# Patient Record
Sex: Female | Born: 1935 | Race: White | Hispanic: No | Marital: Married | Smoking: Never smoker
Health system: Southern US, Community
[De-identification: ages and names within clinical notes are randomized; demographics above are authoritative.]

## PROBLEM LIST (undated history)

## (undated) DIAGNOSIS — K222 Esophageal obstruction: Secondary | ICD-10-CM

## (undated) DIAGNOSIS — I5032 Chronic diastolic (congestive) heart failure: Secondary | ICD-10-CM

## (undated) DIAGNOSIS — K648 Other hemorrhoids: Secondary | ICD-10-CM

## (undated) DIAGNOSIS — K8020 Calculus of gallbladder without cholecystitis without obstruction: Secondary | ICD-10-CM

## (undated) DIAGNOSIS — E785 Hyperlipidemia, unspecified: Secondary | ICD-10-CM

## (undated) DIAGNOSIS — J8410 Pulmonary fibrosis, unspecified: Secondary | ICD-10-CM

## (undated) DIAGNOSIS — K635 Polyp of colon: Secondary | ICD-10-CM

## (undated) DIAGNOSIS — F32A Depression, unspecified: Secondary | ICD-10-CM

## (undated) DIAGNOSIS — E669 Obesity, unspecified: Secondary | ICD-10-CM

## (undated) DIAGNOSIS — I2510 Atherosclerotic heart disease of native coronary artery without angina pectoris: Secondary | ICD-10-CM

## (undated) DIAGNOSIS — M1990 Unspecified osteoarthritis, unspecified site: Secondary | ICD-10-CM

## (undated) DIAGNOSIS — R0600 Dyspnea, unspecified: Secondary | ICD-10-CM

## (undated) DIAGNOSIS — I10 Essential (primary) hypertension: Secondary | ICD-10-CM

## (undated) DIAGNOSIS — F329 Major depressive disorder, single episode, unspecified: Secondary | ICD-10-CM

## (undated) DIAGNOSIS — C50919 Malignant neoplasm of unspecified site of unspecified female breast: Secondary | ICD-10-CM

## (undated) DIAGNOSIS — K219 Gastro-esophageal reflux disease without esophagitis: Secondary | ICD-10-CM

## (undated) DIAGNOSIS — K5790 Diverticulosis of intestine, part unspecified, without perforation or abscess without bleeding: Secondary | ICD-10-CM

## (undated) HISTORY — DX: Essential (primary) hypertension: I10

## (undated) HISTORY — DX: Other hemorrhoids: K64.8

## (undated) HISTORY — PX: CHOLECYSTECTOMY: SHX55

## (undated) HISTORY — DX: Unspecified osteoarthritis, unspecified site: M19.90

## (undated) HISTORY — PX: ABDOMINAL HYSTERECTOMY: SHX81

## (undated) HISTORY — DX: Gastro-esophageal reflux disease without esophagitis: K21.9

## (undated) HISTORY — DX: Major depressive disorder, single episode, unspecified: F32.9

## (undated) HISTORY — PX: FOOT SURGERY: SHX648

## (undated) HISTORY — DX: Diverticulosis of intestine, part unspecified, without perforation or abscess without bleeding: K57.90

## (undated) HISTORY — PX: APPENDECTOMY: SHX54

## (undated) HISTORY — DX: Malignant neoplasm of unspecified site of unspecified female breast: C50.919

## (undated) HISTORY — DX: Dyspnea, unspecified: R06.00

## (undated) HISTORY — DX: Obesity, unspecified: E66.9

## (undated) HISTORY — DX: Esophageal obstruction: K22.2

## (undated) HISTORY — DX: Pulmonary fibrosis, unspecified: J84.10

## (undated) HISTORY — DX: Calculus of gallbladder without cholecystitis without obstruction: K80.20

## (undated) HISTORY — PX: BREAST SURGERY: SHX581

## (undated) HISTORY — DX: Hyperlipidemia, unspecified: E78.5

## (undated) HISTORY — PX: MASTECTOMY: SHX3

## (undated) HISTORY — DX: Atherosclerotic heart disease of native coronary artery without angina pectoris: I25.10

## (undated) HISTORY — DX: Depression, unspecified: F32.A

---

## 1967-11-07 DIAGNOSIS — C50919 Malignant neoplasm of unspecified site of unspecified female breast: Secondary | ICD-10-CM

## 1967-11-07 HISTORY — DX: Malignant neoplasm of unspecified site of unspecified female breast: C50.919

## 1998-10-18 ENCOUNTER — Other Ambulatory Visit: Admission: RE | Admit: 1998-10-18 | Discharge: 1998-10-18 | Payer: Self-pay | Admitting: *Deleted

## 1999-10-03 ENCOUNTER — Encounter: Admission: RE | Admit: 1999-10-03 | Discharge: 1999-10-03 | Payer: Self-pay | Admitting: *Deleted

## 1999-10-03 ENCOUNTER — Encounter: Payer: Self-pay | Admitting: *Deleted

## 1999-10-17 ENCOUNTER — Other Ambulatory Visit: Admission: RE | Admit: 1999-10-17 | Discharge: 1999-10-17 | Payer: Self-pay | Admitting: *Deleted

## 2000-11-15 ENCOUNTER — Other Ambulatory Visit: Admission: RE | Admit: 2000-11-15 | Discharge: 2000-11-15 | Payer: Self-pay | Admitting: *Deleted

## 2004-05-16 ENCOUNTER — Ambulatory Visit (HOSPITAL_BASED_OUTPATIENT_CLINIC_OR_DEPARTMENT_OTHER): Admission: RE | Admit: 2004-05-16 | Discharge: 2004-05-16 | Payer: Self-pay | Admitting: Otolaryngology

## 2005-01-16 ENCOUNTER — Other Ambulatory Visit: Admission: RE | Admit: 2005-01-16 | Discharge: 2005-01-16 | Payer: Self-pay | Admitting: Gynecology

## 2006-03-02 ENCOUNTER — Encounter: Admission: RE | Admit: 2006-03-02 | Discharge: 2006-03-02 | Payer: Self-pay | Admitting: Internal Medicine

## 2006-11-06 DIAGNOSIS — K5790 Diverticulosis of intestine, part unspecified, without perforation or abscess without bleeding: Secondary | ICD-10-CM

## 2006-11-06 HISTORY — DX: Diverticulosis of intestine, part unspecified, without perforation or abscess without bleeding: K57.90

## 2006-11-15 ENCOUNTER — Ambulatory Visit: Payer: Self-pay | Admitting: Gastroenterology

## 2006-11-26 ENCOUNTER — Ambulatory Visit: Payer: Self-pay | Admitting: Gastroenterology

## 2007-01-21 ENCOUNTER — Other Ambulatory Visit: Admission: RE | Admit: 2007-01-21 | Discharge: 2007-01-21 | Payer: Self-pay | Admitting: Gynecology

## 2007-03-08 ENCOUNTER — Encounter: Admission: RE | Admit: 2007-03-08 | Discharge: 2007-03-08 | Payer: Self-pay | Admitting: Orthopaedic Surgery

## 2007-04-22 ENCOUNTER — Ambulatory Visit (HOSPITAL_COMMUNITY): Admission: RE | Admit: 2007-04-22 | Discharge: 2007-04-22 | Payer: Self-pay | Admitting: Internal Medicine

## 2010-03-10 ENCOUNTER — Ambulatory Visit: Admission: RE | Admit: 2010-03-10 | Discharge: 2010-03-10 | Payer: Self-pay | Admitting: Internal Medicine

## 2010-03-10 ENCOUNTER — Encounter: Payer: Self-pay | Admitting: Internal Medicine

## 2010-11-22 ENCOUNTER — Encounter: Payer: Self-pay | Admitting: Internal Medicine

## 2010-11-28 ENCOUNTER — Ambulatory Visit
Admission: RE | Admit: 2010-11-28 | Discharge: 2010-11-28 | Payer: Self-pay | Source: Home / Self Care | Attending: Internal Medicine | Admitting: Internal Medicine

## 2010-11-28 DIAGNOSIS — G4733 Obstructive sleep apnea (adult) (pediatric): Secondary | ICD-10-CM | POA: Insufficient documentation

## 2010-11-28 DIAGNOSIS — K219 Gastro-esophageal reflux disease without esophagitis: Secondary | ICD-10-CM | POA: Insufficient documentation

## 2010-11-28 DIAGNOSIS — R0602 Shortness of breath: Secondary | ICD-10-CM | POA: Insufficient documentation

## 2010-11-28 DIAGNOSIS — C50919 Malignant neoplasm of unspecified site of unspecified female breast: Secondary | ICD-10-CM | POA: Insufficient documentation

## 2010-11-28 DIAGNOSIS — J309 Allergic rhinitis, unspecified: Secondary | ICD-10-CM | POA: Insufficient documentation

## 2010-11-28 DIAGNOSIS — I10 Essential (primary) hypertension: Secondary | ICD-10-CM | POA: Insufficient documentation

## 2010-12-08 NOTE — Assessment & Plan Note (Signed)
Summary: Pulmonary/ new pt eval for sob with no desats x 2 laps   Visit Type:  Initial Consult Copy to:  Dr. Buren Kos Primary Provider/Referring Provider:  Dr. Buren Kos  CC:  Dyspnea.  History of Present Illness: 77 yowf never smoker referred by Dr Clelia Croft for eval of new onset sob early 2012  November 28, 2010  1st pulmonary office eval for new onset  doe x across the room, abupt onset right after NY's, assoc with increase cough getting better and chest tightness assoc also with nasal congestion..  sleeping better most of the nights without noct exac or early am excess mucus production.  Pt denies any significant sore throat, dysphagia, itching, sneezing,    excess or purulent nasal secretions,  fever, chills, sweats, unintended wt loss, pleuritic or exertional cp, hempoptysis, change in activity tolerance  orthopnea pnd or leg swelling. Pt also denies any obvious fluctuation in symptoms with weather or environmental change or other alleviating or aggravating factors.    doesn't think proaire or spiriva helped    Current Medications (verified): 1)  Zoloft 100 Mg Tabs (Sertraline Hcl) .Marland Kitchen.. 1 Once Daily 2)  Fish Oil Maximum Strength 1200 Mg Caps (Omega-3 Fatty Acids) .... 2 Once Daily 3)  Flax Seed Oil 1000 Mg Caps (Flaxseed (Linseed)) .... 2 Once Daily 4)  Vitamin D 1000 Unit Tabs (Cholecalciferol) .Marland Kitchen.. 1 Once Daily 5)  Prevacid 24hr 15 Mg Cpdr (Lansoprazole) .Marland Kitchen.. 1 Every Am 6)  Vivelle-Dot 0.0375 Mg/24hr Pttw (Estradiol) .Marland Kitchen.. 1 Patch Twice A Week 7)  Tramadol Hcl 50 Mg Tabs (Tramadol Hcl) .Marland Kitchen.. 1 Every 8 Hrs As Needed 8)  Proair Hfa 108 (90 Base) Mcg/act Aers (Albuterol Sulfate) .... 2 Puffs Every 4-6 Hrs As Needed 9)  Spiriva Handihaler 18 Mcg Caps (Tiotropium Bromide Monohydrate) .... Inhale Contents of 1 Capsule Daily As Needed 10)  Furosemide 40 Mg Tabs (Furosemide) .Marland Kitchen.. 1 Once Daily 11)  Celebrex 200 Mg Caps (Celecoxib) .Marland Kitchen.. 1 At Bedtime 12)  Alprazolam 0.5 Mg Tabs  (Alprazolam) .Marland Kitchen.. 1 Two Times A Day As Needed  Allergies (verified): 1)  ! * Avelox  Past History:  Past Medical History: Dyspnea     - No desats walking November 28, 2010 x 75 laps, sats improved with activity     - PFT's 03/10/10 FEV1 1.68 (130%) ratio 74 ,  DLCO 48%   (vs 45% 04/22/07) corrects to 60%  Past Surgical History: Hysterectomy  Cholecystectomy mid 90's Radial rt mastectomy 1969  Family History: Emphysema- Brother Heart dz- Both Parents Bladder CA- Mother Esophageal CA- Brother Prostate CA- Brother  Social History: Married No children Retired Environmental health practitioner Never smoker ETOH rare  Review of Systems       The patient complains of shortness of breath with activity, productive cough, non-productive cough, irregular heartbeats, loss of appetite, nasal congestion/difficulty breathing through nose, anxiety, depression, and joint stiffness or pain.  The patient denies shortness of breath at rest, coughing up blood, chest pain, acid heartburn, indigestion, weight change, abdominal pain, difficulty swallowing, sore throat, tooth/dental problems, headaches, sneezing, itching, ear ache, hand/feet swelling, rash, change in color of mucus, and fever.    Vital Signs:  Patient profile:   77 year old female Height:      62 inches Weight:      155.38 pounds BMI:     28.52 O2 Sat:      97 % on Room air Temp:     98.6 degrees F oral Pulse rate:  86 / minute BP sitting:   132 / 70  (left arm) Cuff size:   large  Vitals Entered By: Vernie Murders (November 28, 2010 11:01 AM)  O2 Flow:  Room air  Serial Vital Signs/Assessments:  Comments: 12:06 PM Ambulatory Pulse Oximetry  Resting; HR__83___    02 Sat__96%ra___  Lap1 (185 feet)   HR_97____   02 Sat__92%ra___ Lap2 (185 feet)   HR__88___   02 Sat__95%ra___    Lap3 (185 feet)   HR_____   02 Sat_____  ___Test Completed without Difficulty _x__Test Stopped due to:pt c/o SOB   By: Vernie Murders     CXR  Procedure date:  11/22/2010  Findings:      Chronic scarring R apex o/w wnl  Impression & Recommendations:  Problem # 1:  DYSPNEA (ICD-786.05)  Unable to reproduce this "constant" complaint in the office where her sats actually go up with activity, strongly ruling against a pulmonary cause of activity intolerance in general but also ruling out any impact from the low dlco detected x 2 by pft's which show no obstructive or restrictive changes.  No further pulmonary w/u indicated and no indication for spiriva  Problem # 2:  GERD (ICD-530.81)  Her updated medication list for this problem includes:    Prevacid 24hr 15 Mg Cpdr (Lansoprazole) .Marland Kitchen... Take  one 30-60 min before first meal of the day   Some of here acute recent symptoms may be GERD related. Of the three most common causes of chronic cough, only one (GERD)can actually cause the other two and perpetuate the cylce of cough inducing airway trauma, inflammation, heightened sensitivity to reflux which is prompted by the cough itself via a cyclical mechanism.  This may partially respond to steroids and look like asthma and post nasal drainage but never erradicated completely unless the cough and the secondary reflux are eliminated, preferably both at the same time.  Therefore max rx plus diet reviewed.  Orders: New Patient Level V (16109)  Medications Added to Medication List This Visit: 1)  Zoloft 100 Mg Tabs (Sertraline hcl) .Marland Kitchen.. 1 once daily 2)  Fish Oil Maximum Strength 1200 Mg Caps (Omega-3 fatty acids) .... 2 once daily 3)  Flax Seed Oil 1000 Mg Caps (Flaxseed (linseed)) .... 2 once daily 4)  Vitamin D 1000 Unit Tabs (Cholecalciferol) .Marland Kitchen.. 1 once daily 5)  Prevacid 24hr 15 Mg Cpdr (Lansoprazole) .Marland Kitchen.. 1 every am 6)  Prevacid 24hr 15 Mg Cpdr (Lansoprazole) .... Take  one 30-60 min before first meal of the day 7)  Vivelle-dot 0.0375 Mg/24hr Pttw (Estradiol) .Marland Kitchen.. 1 patch twice a week 8)  Tramadol Hcl 50 Mg Tabs  (Tramadol hcl) .Marland Kitchen.. 1 every 8 hrs as needed 9)  Proair Hfa 108 (90 Base) Mcg/act Aers (Albuterol sulfate) .... 2 puffs every 4-6 hrs as needed 10)  Spiriva Handihaler 18 Mcg Caps (Tiotropium bromide monohydrate) .... Inhale contents of 1 capsule daily as needed 11)  Furosemide 40 Mg Tabs (Furosemide) .Marland Kitchen.. 1 once daily 12)  Celebrex 200 Mg Caps (Celecoxib) .Marland Kitchen.. 1 at bedtime 13)  Alprazolam 0.5 Mg Tabs (Alprazolam) .Marland Kitchen.. 1 two times a day as needed  Other Orders: Pulse Oximetry, Ambulatory (60454)  Patient Instructions: 1)  Add pepcid 20 mg one at bedtime as long as coughiing at all 2)  GERD (REFLUX)  is a common cause of respiratory symptoms. It commonly presents without heartburn and can be treated with medication, but also with lifestyle changes including avoidance of late meals, excessive alcohol, smoking  cessation, and avoid fatty foods, chocolate, peppermint, colas, red wine, and acidic juices such as orange juice. NO MINT OR MENTHOL PRODUCTS SO NO COUGH DROPS  3)  USE SUGARLESS CANDY INSTEAD (jolley ranchers)  4)  NO OIL BASED VITAMINS  (NO fish oil or flaxseed oil)  5)  Stop spiriva and only use the proaire if needed 6)  Return here if not improving after 2 weeks

## 2010-12-12 ENCOUNTER — Other Ambulatory Visit (HOSPITAL_COMMUNITY): Payer: Self-pay

## 2010-12-13 ENCOUNTER — Ambulatory Visit (HOSPITAL_COMMUNITY): Payer: Medicare Other | Attending: Internal Medicine

## 2010-12-13 DIAGNOSIS — I080 Rheumatic disorders of both mitral and aortic valves: Secondary | ICD-10-CM | POA: Insufficient documentation

## 2010-12-13 DIAGNOSIS — Z8249 Family history of ischemic heart disease and other diseases of the circulatory system: Secondary | ICD-10-CM | POA: Insufficient documentation

## 2010-12-13 DIAGNOSIS — I079 Rheumatic tricuspid valve disease, unspecified: Secondary | ICD-10-CM | POA: Insufficient documentation

## 2010-12-13 DIAGNOSIS — R0989 Other specified symptoms and signs involving the circulatory and respiratory systems: Secondary | ICD-10-CM | POA: Insufficient documentation

## 2010-12-13 DIAGNOSIS — R0609 Other forms of dyspnea: Secondary | ICD-10-CM | POA: Insufficient documentation

## 2011-03-24 NOTE — Assessment & Plan Note (Signed)
[facility]                         GASTROENTEROLOGY OFFICE NOTE   NAME:[name]                       MRN:          [id number]  DATE:11/15/2006                            DOB:          [birth year]    This nice patient comes in to discuss having a colonoscopic examination.  She is a patient of Dr. Clelia Croft.  Her last colonoscopic examination was in  2003, and she was to have a follow-up in 5 years.  She does have a very  fixed left colon, which makes it very difficult to do it.  We used the  pediatric colonoscope last time.  She was found to have only some mild  diverticular disease and a fixed left colon as we talked about and  __________ during the procedure.  Suarez trouble was encountered during the  procedure.  She has a history of hemorrhoids, gas, constipation and  bloating.   FAMILY HISTORY:  She has a brother with esophageal cancer.   PAST MEDICAL HISTORY:  She has some arthritis and has had breast cancer,  hysterectomy and appendectomy.   SOCIAL HISTORY:  Really noncontributory.   REVIEW OF SYSTEMS:  She has had some arthritis, night sweats, back pain,  and sinus trouble in the past.   PHYSICAL EXAMINATION:  VITAL SIGNS:  She was 5 feet 3 inches, weight  154.  Blood pressure 132/64, pulse 75 and regular.  NECK:  Unremarkable.  HEART:  Unremarkable.  EXTREMITIES:  Unremarkable.   IMPRESSION:  1. Status post breast cancer.  2. Status post hysterectomy and appendectomy and breast surgery.  3. Arthritis.  4. Status post endometriosis.   RECOMMENDATIONS:  Schedule her for a colonoscopic examination sometime  in the near future.     Ulyess Mort, MD  Electronically Signed    SML/MedQ  DD: 11/15/2006  DT: 11/16/2006  Job #: [id number]   cc:   Kari Baars, M.D.

## 2011-03-24 NOTE — Procedures (Signed)
NAME:  [name], [name]              ACCOUNT NO.:  [id number]   MEDICAL RECORD NO.:  [id number]          PATIENT TYPE:  OUT   LOCATION:  SLEE                         FACILITY:  [facility]   PHYSICIAN:  Clinton D. Maple Hudson, M.D. DATE OF BIRTH:  12-24-1927   DATE OF ADMISSION:  05/16/2004  DATE OF DISCHARGE:  05/16/2004                              NOCTURNAL POLYSOMNOGRAM   REFERRING PHYSICIAN:  Cristal Deer E. Ezzard Standing, M.D.   INDICATIONS FOR STUDY:  History:  Hypersomnia with sleep apnea, excessive  daytime somnolence, witnessed snoring.   HOME MEDICATIONS:  Nasacort, estrogen, Zantac, and Celebrex.  She used  Nasacort before the study.   Epworth score 12/24.  Neck size: 14.5 inches.  BMI 28.3  Weight 160 lbs.  A split study protocol was requested.   SLEEP ARCHITECTURE:  Short sleep time with 233 minutes recorded for a sleep  deficiency of 62%.  Of total sleep time, 20% with stage 1, 53% stage 2, 9%  stages 3 and 4, 18% REM.  Latency to sleep onset was 30 minutes, with sleep  onset recorded at midnight.  REM latency was 192 minutes.   RESPIRATORY DATA:  Split night protocol was followed.  Severe obstructive  sleep apnea was recorded at baseline, with an RDI of 74 obstructive events  per hour.  During 178 minutes before CPAP there were 85 obstructive apneas  and 74 hypopneas.  She slept supine.  REM RDI was 4.3 per hour.  CPAP was  titrated to 9 CWP (RDI of 0 per hour).  Initially, a full face mask was  uncomfortable, and was then switched to a [employer] nasal tube  mask with small nasal pillows.  A heated humidifier is suggested.   OXYGEN DATA:  Before CPAP snoring was moderate and associated with oxygen  desaturation to 73%.  On CPAP of subsequent saturation was 94%.   CARDIAC DATA:  Normal sinus rhythm.   MOVEMENT/PARASOMNIA:  Occasional leg jerks with insignificant impact on  sleep.   IMPRESSION/RECOMMENDATION:  Severe obstructive sleep apnea/hypopnea syndrome  (RDI 74  per hour), with mild-to-moderate oxygen desaturation, and successful  CPAP titration to 9 CWP, using a nasal tube mask as described.                                   ______________________________                                Rennis Chris Maple Hudson, M.D.                                Diplomate, American Board of Sleep Medicine    CDY/MEDQ  D:  05/22/2004 16:49:01  T:  05/23/2004 16:36:22  Job:  [id number]

## 2011-11-07 DIAGNOSIS — K635 Polyp of colon: Secondary | ICD-10-CM

## 2011-11-07 HISTORY — DX: Polyp of colon: K63.5

## 2011-11-21 ENCOUNTER — Encounter: Payer: Self-pay | Admitting: Gastroenterology

## 2011-11-24 ENCOUNTER — Encounter: Payer: Self-pay | Admitting: Gastroenterology

## 2011-12-08 ENCOUNTER — Ambulatory Visit (INDEPENDENT_AMBULATORY_CARE_PROVIDER_SITE_OTHER): Payer: Medicare Other | Admitting: Gastroenterology

## 2011-12-08 ENCOUNTER — Encounter: Payer: Self-pay | Admitting: Gastroenterology

## 2011-12-08 DIAGNOSIS — Z1211 Encounter for screening for malignant neoplasm of colon: Secondary | ICD-10-CM | POA: Insufficient documentation

## 2011-12-08 DIAGNOSIS — K222 Esophageal obstruction: Secondary | ICD-10-CM | POA: Insufficient documentation

## 2011-12-08 NOTE — Patient Instructions (Signed)
Follow up as needed

## 2011-12-08 NOTE — Assessment & Plan Note (Signed)
The patient has no signs or symptoms of ongoing colonic disease. Last colonoscopy in 2008 demonstrate diverticulosis. I don't think surveillance colonoscopy is indicated at this time.

## 2011-12-08 NOTE — Assessment & Plan Note (Signed)
She is minimally symptomatic with very mild dysphagia is to pills. Plan to repeat dilatation as needed

## 2011-12-08 NOTE — Progress Notes (Signed)
History of Present Illness: Ms.  Suarez is 78 year old white female referred at the request of Dr. Clelia Croft for consideration of colonoscopy. Colonoscopy in 2003 and 2008 demonstrated diverticulosis. She has no GI complaints except for occasional constipation. Specifically, she is without change in bowel habits, melena or hematochezia.  The patient has a history of an esophageal stricture. She notices very mild dysphagia when she swallows pills.    Past Medical History  Diagnosis Date  . Dyspnea   . Diverticulosis 2008  . Internal hemorrhoids   . Esophageal stricture   . Arthritis   . Breast cancer 1969  . GERD (gastroesophageal reflux disease)   . Gallstones   . Depression   . HLD (hyperlipidemia)   . HTN (hypertension)   . Obesity    Past Surgical History  Procedure Date  . Abdominal hysterectomy   . Cholecystectomy   . Mastectomy     right  . Appendectomy    family history includes Cancer in her brother and mother; Esophageal cancer in her brother; Heart disease in her brother and mother; and Prostate cancer in her brother. Current Outpatient Prescriptions  Medication Sig Dispense Refill  . aspirin 81 MG tablet Take 160 mg by mouth daily.      . celecoxib (CELEBREX) 200 MG capsule Take 200 mg by mouth daily.      Marland Kitchen estradiol (VIVELLE-DOT) 0.0375 MG/24HR Place 1 patch onto the skin 2 (two) times a week.      . Lansoprazole (PREVACID 24HR PO) Take by mouth 1 day or 1 dose.      . Multiple Vitamin (MULTIVITAMIN) capsule Take 1 capsule by mouth daily.      . sertraline (ZOLOFT) 100 MG tablet Take 100 mg by mouth daily.      . traMADol (ULTRAM) 50 MG tablet Take 50 mg by mouth every 6 (six) hours as needed.       Allergies as of 12/08/2011 - Review Complete 12/08/2011  Allergen Reaction Noted  . Antihistamines, loratadine-type  12/08/2011  . Codeine  12/08/2011  . Darvocet (propoxyphene n-acetaminophen)  12/08/2011  . Moxifloxacin      reports that she has never smoked. She  has never used smokeless tobacco. She reports that she does not drink alcohol or use illicit drugs.     Review of Systems: She complains of urinary frequency. She has back and knee pain, sleeping problems and complaints of fatigue. Pertinent positive and negative review of systems were noted in the above HPI section. All other review of systems were otherwise negative.  Vital signs were reviewed in today's medical record Physical Exam: General: Well developed , well nourished, no acute distress Head: Normocephalic and atraumatic Eyes:  sclerae anicteric, EOMI Ears: Normal auditory acuity Mouth: No deformity or lesions Neck: Supple, no masses or thyromegaly Lungs: Clear throughout to auscultation Heart: Regular rate and rhythm; no murmurs, rubs or bruits Abdomen: Soft, non tender and non distended. No masses, hepatosplenomegaly or hernias noted. Normal Bowel sounds Rectal:deferred Musculoskeletal: Symmetrical with no gross deformities  Skin: No lesions on visible extremities Pulses:  Normal pulses noted Extremities: No clubbing, cyanosis, edema or deformities noted Neurological: Alert oriented x 4, grossly nonfocal Cervical Nodes:  No significant cervical adenopathy Inguinal Nodes: No significant inguinal adenopathy Psychological:  Alert and cooperative. Normal mood and affect

## 2012-01-29 DIAGNOSIS — Z1231 Encounter for screening mammogram for malignant neoplasm of breast: Secondary | ICD-10-CM | POA: Diagnosis not present

## 2012-02-18 ENCOUNTER — Emergency Department (HOSPITAL_COMMUNITY): Payer: Medicare Other

## 2012-02-18 ENCOUNTER — Emergency Department (HOSPITAL_COMMUNITY)
Admission: EM | Admit: 2012-02-18 | Discharge: 2012-02-18 | Disposition: A | Discharge status: Hospice | Payer: Medicare Other | Attending: Emergency Medicine | Admitting: Emergency Medicine

## 2012-02-18 ENCOUNTER — Telehealth: Payer: Self-pay | Admitting: Gastroenterology

## 2012-02-18 ENCOUNTER — Encounter (HOSPITAL_COMMUNITY): Payer: Self-pay | Admitting: *Deleted

## 2012-02-18 DIAGNOSIS — R197 Diarrhea, unspecified: Secondary | ICD-10-CM | POA: Insufficient documentation

## 2012-02-18 DIAGNOSIS — M129 Arthropathy, unspecified: Secondary | ICD-10-CM | POA: Diagnosis not present

## 2012-02-18 DIAGNOSIS — R109 Unspecified abdominal pain: Secondary | ICD-10-CM | POA: Diagnosis not present

## 2012-02-18 DIAGNOSIS — Z79899 Other long term (current) drug therapy: Secondary | ICD-10-CM | POA: Diagnosis not present

## 2012-02-18 DIAGNOSIS — R5381 Other malaise: Secondary | ICD-10-CM | POA: Insufficient documentation

## 2012-02-18 DIAGNOSIS — I10 Essential (primary) hypertension: Secondary | ICD-10-CM | POA: Insufficient documentation

## 2012-02-18 DIAGNOSIS — K7689 Other specified diseases of liver: Secondary | ICD-10-CM | POA: Diagnosis not present

## 2012-02-18 DIAGNOSIS — K5900 Constipation, unspecified: Secondary | ICD-10-CM | POA: Diagnosis not present

## 2012-02-18 DIAGNOSIS — E785 Hyperlipidemia, unspecified: Secondary | ICD-10-CM | POA: Diagnosis not present

## 2012-02-18 DIAGNOSIS — F329 Major depressive disorder, single episode, unspecified: Secondary | ICD-10-CM | POA: Insufficient documentation

## 2012-02-18 DIAGNOSIS — R110 Nausea: Secondary | ICD-10-CM | POA: Insufficient documentation

## 2012-02-18 DIAGNOSIS — Z7982 Long term (current) use of aspirin: Secondary | ICD-10-CM | POA: Diagnosis not present

## 2012-02-18 DIAGNOSIS — K219 Gastro-esophageal reflux disease without esophagitis: Secondary | ICD-10-CM | POA: Insufficient documentation

## 2012-02-18 DIAGNOSIS — K5730 Diverticulosis of large intestine without perforation or abscess without bleeding: Secondary | ICD-10-CM | POA: Diagnosis not present

## 2012-02-18 DIAGNOSIS — Z853 Personal history of malignant neoplasm of breast: Secondary | ICD-10-CM | POA: Diagnosis not present

## 2012-02-18 DIAGNOSIS — F3289 Other specified depressive episodes: Secondary | ICD-10-CM | POA: Insufficient documentation

## 2012-02-18 DIAGNOSIS — R1084 Generalized abdominal pain: Secondary | ICD-10-CM | POA: Diagnosis not present

## 2012-02-18 LAB — BASIC METABOLIC PANEL
BUN: 8 mg/dL (ref 6–23)
CO2: 26 mEq/L (ref 19–32)
Calcium: 9.5 mg/dL (ref 8.4–10.5)
Chloride: 100 mEq/L (ref 96–112)
Creatinine, Ser: 0.64 mg/dL (ref 0.50–1.10)
GFR calc Af Amer: >90 mL/min (ref >90)
GFR calc non Af Amer: 81 mL/min — ABNORMAL LOW (ref >90)
Glucose, Bld: 95 mg/dL (ref 70–99)
Potassium: 3.7 mEq/L (ref 3.5–5.1)
Sodium: 133 mEq/L — ABNORMAL LOW (ref 135–145)

## 2012-02-18 LAB — URINALYSIS, ROUTINE W REFLEX MICROSCOPIC
Bilirubin Urine: NEGATIVE
Glucose, UA: NEGATIVE mg/dL
Hgb urine dipstick: NEGATIVE
Ketones, ur: NEGATIVE mg/dL
Leukocytes, UA: NEGATIVE
Nitrite: NEGATIVE
Protein, ur: NEGATIVE mg/dL
Specific Gravity, Urine: 1.002 — ABNORMAL LOW (ref 1.005–1.030)
Urobilinogen, UA: 0.2 mg/dL (ref 0.0–1.0)
pH: 6.5 (ref 5.0–8.0)

## 2012-02-18 LAB — CBC
HCT: 43.9 % (ref 36.0–46.0)
Hemoglobin: 14.6 g/dL (ref 12.0–15.0)
MCH: 30.2 pg (ref 26.0–34.0)
MCHC: 33.3 g/dL (ref 30.0–36.0)
MCV: 90.9 fL (ref 78.0–100.0)
Platelets: 175 10*3/uL (ref 150–400)
RBC: 4.83 MIL/uL (ref 3.87–5.11)
RDW: 13.8 % (ref 11.5–15.5)
WBC: 6.4 10*3/uL (ref 4.0–10.5)

## 2012-02-18 MED ORDER — IOHEXOL 300 MG/ML  SOLN
100.0000 mL | Freq: Once | INTRAMUSCULAR | Status: AC | PRN
Start: 2012-02-18 — End: 2012-02-18
  Administered 2012-02-18: 100 mL via INTRAVENOUS

## 2012-02-18 MED ORDER — MORPHINE SULFATE 4 MG/ML IJ SOLN
4.0000 mg | Freq: Once | INTRAMUSCULAR | Status: AC
  Administered 2012-02-18: 4 mg via INTRAVENOUS
  Filled 2012-02-18: qty 1

## 2012-02-18 MED ORDER — SODIUM CHLORIDE 0.9 % IV BOLUS (SEPSIS)
500.0000 mL | Freq: Once | INTRAVENOUS | Status: AC
  Administered 2012-02-18: 500 mL via INTRAVENOUS

## 2012-02-18 MED ORDER — ONDANSETRON HCL 4 MG/2ML IJ SOLN
4.0000 mg | Freq: Once | INTRAMUSCULAR | Status: AC
  Administered 2012-02-18: 4 mg via INTRAVENOUS
  Filled 2012-02-18: qty 2

## 2012-02-18 NOTE — Telephone Encounter (Signed)
On call note at 1515. Pt states she feels "rotten". She has 2 days, of nausea, anorexia, cough, upper abd pain that generalizes and all symptoms seems to be worsening. She has ongoing constipation. Sees Dr. Arlyce Dice for GI and Dr. Clelia Croft is her PCP. I  advised to have someone take her to the ED as soon as she can and she states she will do so.

## 2012-02-18 NOTE — Discharge Instructions (Signed)

## 2012-02-18 NOTE — ED Notes (Signed)
Pt drinking CT contrast 

## 2012-02-18 NOTE — ED Notes (Signed)
Pt reports one month of symptoms, with symptoms becoming worse yesterday. Pt reports generalized abdominal pain that is intermittent and cramping in nature. Pt reports yesterday abdominal pain was centrally located and more severe than previously. Pt reports pain was improved with a heating pad. Pt reports pain is also associated with nausea, diarrhea and constipation.  Pt reports she feels generally weak and dehydrated.

## 2012-02-18 NOTE — ED Provider Notes (Signed)
History    75y female with abdominal pain. Gradual onset of a month ago. Intermittent. Crampy. Diffuse. No appreciable exacerbating relieving factors. No urinary complaints. Mild nausea but no vomiting. Diarrhea. No unusual vaginal bleeding or discharge. S/p appy, chole and hysterectomy.  CSN: [id number]  Arrival date & time 02/18/12  1614   First MD Initiated Contact with Patient 02/18/12 1749      Chief Complaint  Patient presents with  . Abdominal Cramping  . Nausea  . Diarrhea  . Constipation  . Weakness    (Consider location/radiation/quality/duration/timing/severity/associated sxs/prior treatment) HPI  Past Medical History  Diagnosis Date  . Dyspnea   . Diverticulosis 2008  . Internal hemorrhoids   . Esophageal stricture   . Arthritis   . Breast cancer 1969  . GERD (gastroesophageal reflux disease)   . Gallstones   . Depression   . HLD (hyperlipidemia)   . HTN (hypertension)   . Obesity     Past Surgical History  Procedure Date  . Abdominal hysterectomy   . Cholecystectomy   . Mastectomy     right  . Appendectomy   . Foot surgery   . Breast surgery     Family History  Problem Relation Age of Onset  . Heart disease Mother   . Cancer Mother     bladder  . Esophageal cancer Brother   . Prostate cancer Brother   . Heart disease Brother   . Cancer Brother     sarcoma    History  Substance Use Topics  . Smoking status: Never Smoker   . Smokeless tobacco: Never Used  . Alcohol Use: 4.2 oz/week    7 Glasses of wine per week     wine once a day     OB History    Grav Para Term Preterm Abortions TAB SAB Ect Mult Living                  Review of Systems   Review of symptoms negative unless otherwise noted in HPI.   Allergies  Antihistamines, loratadine-type; Codeine; Darvocet; and Moxifloxacin  Home Medications   Current Outpatient Rx  Name Route Sig Dispense Refill  . ASPIRIN 81 MG PO TABS Oral Take 160 mg by mouth daily.    .  CELECOXIB 200 MG PO CAPS Oral Take 200 mg by mouth daily.    Marland Kitchen ESTRADIOL 0.0375 MG/24HR TD PTTW Transdermal Place 1 patch onto the skin 2 (two) times a week.    Marland Kitchen FLUTICASONE PROPIONATE 50 MCG/ACT NA SUSP Nasal Place 2 sprays into the nose daily.    Marland Kitchen PREVACID 24HR PO Oral Take by mouth 1 day or 1 dose.    . MULTIVITAMINS PO CAPS Oral Take 1 capsule by mouth daily.    . SERTRALINE HCL 100 MG PO TABS Oral Take 100 mg by mouth daily.    . TRAMADOL HCL 50 MG PO TABS Oral Take 50 mg by mouth every 6 (six) hours as needed.      BP 149/60  Pulse 89  Temp(Src) 98.7 F (37.1 C) (Oral)  Resp 20  SpO2 97%  Physical Exam  Nursing note and vitals reviewed. Constitutional: She appears well-developed and well-nourished. No distress.  HENT:  Head: Normocephalic and atraumatic.  Eyes: Conjunctivae are normal. Right eye exhibits no discharge. Left eye exhibits no discharge.  Neck: Neck supple.  Cardiovascular: Normal rate, regular rhythm and normal heart sounds.  Exam reveals no gallop and no friction rub.   No  murmur heard. Pulmonary/Chest: Effort normal and breath sounds normal. No respiratory distress.  Abdominal: Soft. She exhibits no distension. There is no tenderness.       Mild diffuse tenderness. No rebound or guarding. No distention. No mass palpated.  Genitourinary:       No CVA tenderness.  Musculoskeletal: She exhibits no edema and no tenderness.  Neurological: She is alert.  Skin: Skin is warm and dry. She is not diaphoretic.  Psychiatric: She has a normal mood and affect. Her behavior is normal. Thought content normal.    ED Course  Procedures (including critical care time)  Labs Reviewed  URINALYSIS, ROUTINE W REFLEX MICROSCOPIC - Abnormal; Notable for the following:    APPearance CLOUDY (*)    Specific Gravity, Urine 1.002 (*)    All other components within normal limits  BASIC METABOLIC PANEL - Abnormal; Notable for the following:    Sodium 133 (*)    GFR calc non Af  Amer 81 (*)    All other components within normal limits  CBC   No results found.  Ct Abdomen Pelvis W Contrast  02/18/2012  *RADIOLOGY REPORT*  Clinical Data: 2 days of abdominal pain, cramping, anorexia, nausea and cough.  Constipation.  CT ABDOMEN AND PELVIS WITH CONTRAST  Technique:  Multidetector CT imaging of the abdomen and pelvis was performed following the standard protocol during bolus administration of intravenous contrast.  Contrast: OMNIPAQUE IOHEXOL 300 MG/ML  SOLN  Comparison: MRI of the lumbar spine performed 03/08/2007  Findings: Bibasilar honeycombing and scarring are noted, reflecting mild pulmonary fibrosis.  A large mildly lobulated 8.0 cm cyst is noted within the right hepatic lobe, at the hepatic dome.  A tiny hypodensity more inferiorly within the right hepatic lobe is nonspecific but may reflect a small cyst.  Mild prominence of the intrahepatic biliary ducts likely remains within normal limits, status post cholecystectomy.  Clips are noted along the gallbladder fossa.  The kidneys are unremarkable in appearance.  Mild bilateral pelvicaliectasis remains within normal limits.  There is no evidence of hydronephrosis.  No renal or ureteral stones are seen. No perinephric stranding is appreciated.  No free fluid is identified.  The small bowel is unremarkable in appearance.  The stomach is within normal limits. Contrast within the distal esophagus may reflect gastroesophageal reflux or mild esophageal dysmotility.  No acute vascular abnormalities are seen. Scattered calcification is noted along the abdominal aorta and its branches.  There is an unusual soft tissue septation noted in a horizontal plane across the mid abdomen; this is likely developmental in nature, and may reflect malpositioning of underlying musculature.  The patient is status post appendectomy.  There is diffuse diverticulosis along the descending and sigmoid colon, without definite evidence of diverticulitis.  The  colon is otherwise unremarkable in appearance.  The bladder is mildly distended and grossly unremarkable in appearance.  The patient is status post hysterectomy; no suspicious adnexal masses are seen.  No inguinal lymphadenopathy is seen.  No acute osseous abnormalities are identified.  Multilevel vacuum phenomenon, disc space narrowing and endplate sclerosis are noted along the lumbar spine.  IMPRESSION:  1.  No acute abnormalities seen within the abdomen or pelvis. 2.  Diffuse diverticulosis noted along the descending and sigmoid colon, without evidence of diverticulitis. 3.  Scattered calcification along the abdominal aorta and its branches. 4.  Mildly lobulated 8.0 cm cyst noted at the right hepatic lobe. 5.  Degenerative change noted along the lumbar spine. 6.  Unusual  band of musculature noted in a horizontal plane across the mid abdomen; this is likely developmental in nature, and does not appear to be causing any acute abnormalities. 7.  Bibasilar scarring and honeycombing noted at the lung bases, raising concern for mild changes of pulmonary fibrosis.  Original Report Authenticated By: Tonia Ghent, M.D.   1. Abdominal pain     EKG:  Rhythm: NSR Rate: 90 Axis: Right Intervals: 1st degree AVB ST segments: ST depression laterally and t wave inversions  Inferiorly Comparison: None   MDM  83yF with abdominal pain. Etiology unclear, but doubt surgical abdomen. Only mild tenderness on exam. Ct neg for acute pathology. Labs fairly unremarkable. Return precautions discussed. Outpt fu.        Raeford Razor, MD 02/28/12 [phone]

## 2012-02-21 DIAGNOSIS — A088 Other specified intestinal infections: Secondary | ICD-10-CM | POA: Diagnosis not present

## 2012-02-21 DIAGNOSIS — K219 Gastro-esophageal reflux disease without esophagitis: Secondary | ICD-10-CM | POA: Diagnosis not present

## 2012-03-05 DIAGNOSIS — Z01419 Encounter for gynecological examination (general) (routine) without abnormal findings: Secondary | ICD-10-CM | POA: Diagnosis not present

## 2012-04-03 DIAGNOSIS — I10 Essential (primary) hypertension: Secondary | ICD-10-CM | POA: Diagnosis not present

## 2012-04-03 DIAGNOSIS — J8410 Pulmonary fibrosis, unspecified: Secondary | ICD-10-CM | POA: Diagnosis not present

## 2012-04-03 DIAGNOSIS — R7301 Impaired fasting glucose: Secondary | ICD-10-CM | POA: Diagnosis not present

## 2012-07-19 DIAGNOSIS — Z23 Encounter for immunization: Secondary | ICD-10-CM | POA: Diagnosis not present

## 2012-07-24 ENCOUNTER — Ambulatory Visit (INDEPENDENT_AMBULATORY_CARE_PROVIDER_SITE_OTHER): Payer: Medicare Other | Admitting: Gastroenterology

## 2012-07-24 ENCOUNTER — Encounter: Payer: Self-pay | Admitting: Gastroenterology

## 2012-07-24 VITALS — BP 132/60 | HR 62 | Ht 62.0 in | Wt 158.0 lb

## 2012-07-24 DIAGNOSIS — R198 Other specified symptoms and signs involving the digestive system and abdomen: Secondary | ICD-10-CM

## 2012-07-24 DIAGNOSIS — R194 Change in bowel habit: Secondary | ICD-10-CM

## 2012-07-24 MED ORDER — NA SULFATE-K SULFATE-MG SULF 17.5-3.13-1.6 GM/177ML PO SOLN: 1.0000 | Freq: Once | ORAL | Status: DC

## 2012-07-24 NOTE — Assessment & Plan Note (Addendum)
The patient reports a change of bowel habits characterized by thin and constipated stools and 2 episodes of moderately severe left flank pain with radiation to her lower abdomen. She's concerned about a colonic abnormality. There is no evidence for diverticulitis by prior CT scan. A structural lesion of the colon should be ruled out.  Recommendations #1 colonoscopy

## 2012-07-24 NOTE — Progress Notes (Signed)
History of Present Illness:  Mrs. Swartzentruber has returned for evaluation of abdominal pain. Twice in the last 6 months she's had moderately severe left flank pain with radiation into her back and left lower quadrant. In April she was evaluated in the ED where CT scan and urinalysis were negative. No diagnosis was made. She had second less severe episode. She's noted a change in stool caliber whereby stools are more narrow. Bowels are more erratic. There is no history of melena or hematochezia.    Review of Systems: Pertinent positive and negative review of systems were noted in the above HPI section. All other review of systems were otherwise negative.    Current Medications, Allergies, Past Medical History, Past Surgical History, Family History and Social History were reviewed in [employer] electronic medical record  Vital signs were reviewed in today's medical record. Physical Exam: General: Well developed , well nourished, no acute distress Head: Normocephalic and atraumatic Eyes:  sclerae anicteric, EOMI Ears: Normal auditory acuity Mouth: No deformity or lesions Lungs: Clear throughout to auscultation Heart: Regular rate and rhythm; no murmurs, rubs or bruits Abdomen: Soft, non tender and non distended. No masses, hepatosplenomegaly or hernias noted. Normal Bowel sounds Rectal:deferred Musculoskeletal: Symmetrical with no gross deformities  Pulses:  Normal pulses noted Extremities: No clubbing, cyanosis, edema or deformities noted Neurological: Alert oriented x 4, grossly nonfocal Psychological:  Alert and cooperative. Normal mood and affect

## 2012-07-24 NOTE — Patient Instructions (Addendum)
Colonoscopy A colonoscopy is an exam to evaluate your entire colon. In this exam, your colon is cleansed. A long fiberoptic tube is inserted through your rectum and into your colon. The fiberoptic scope (endoscope) is a long bundle of enclosed and very flexible fibers. These fibers transmit light to the area examined and send images from that area to your caregiver. Discomfort is usually minimal. You may be given a drug to help you sleep (sedative) during or prior to the procedure. This exam helps to detect lumps (tumors), polyps, inflammation, and areas of bleeding. Your caregiver may also take a small piece of tissue (biopsy) that will be examined under a microscope. LET YOUR CAREGIVER KNOW ABOUT:   Allergies to food or medicine.   Medicines taken, including vitamins, herbs, eyedrops, over-the-counter medicines, and creams.   Use of steroids (by mouth or creams).   Previous problems with anesthetics or numbing medicines.   History of bleeding problems or blood clots.   Previous surgery.   Other health problems, including diabetes and kidney problems.   Possibility of pregnancy, if this applies.  BEFORE THE PROCEDURE   A clear liquid diet may be required for 2 days before the exam.   Ask your caregiver about changing or stopping your regular medications.   Liquid injections (enemas) or laxatives may be required.   A large amount of electrolyte solution may be given to you to drink over a short period of time. This solution is used to clean out your colon.   You should be present 60 minutes prior to your procedure or as directed by your caregiver.  AFTER THE PROCEDURE   If you received a sedative or pain relieving medication, you will need to arrange for someone to drive you home.   Occasionally, there is a little blood passed with the first bowel movement. Do not be concerned.  FINDING OUT THE RESULTS OF YOUR TEST Not all test results are available during your visit. If your test  results are not back during the visit, make an appointment with your caregiver to find out the results. Do not assume everything is normal if you have not heard from your caregiver or the medical facility. It is important for you to follow up on all of your test results. HOME CARE INSTRUCTIONS   It is not unusual to pass moderate amounts of gas and experience mild abdominal cramping following the procedure. This is due to air being used to inflate your colon during the exam. Walking or a warm pack on your belly (abdomen) may help.   You may resume all normal meals and activities after sedatives and medicines have worn off.   Only take over-the-counter or prescription medicines for pain, discomfort, or fever as directed by your caregiver. Do not use aspirin or blood thinners if a biopsy was taken. Consult your caregiver for medicine usage if biopsies were taken.  SEEK IMMEDIATE MEDICAL CARE IF:   You have a fever.   You pass large blood clots or fill a toilet with blood following the procedure. This may also occur 10 to 14 days following the procedure. This is more likely if a biopsy was taken.   You develop abdominal pain that keeps getting worse and cannot be relieved with medicine.  Document Released: 10/20/2000 Document Revised: 10/12/2011 Document Reviewed: 06/04/2008 Exit[employer] Patient Information 2012 [employer]. 

## 2012-08-12 ENCOUNTER — Encounter: Payer: Self-pay | Admitting: Gastroenterology

## 2012-08-20 ENCOUNTER — Encounter: Payer: Self-pay | Admitting: Gastroenterology

## 2012-08-20 ENCOUNTER — Ambulatory Visit (AMBULATORY_SURGERY_CENTER): Payer: Medicare Other | Admitting: Gastroenterology

## 2012-08-20 VITALS — BP 162/71 | HR 67 | Temp 98.2°F | Resp 15 | Ht 62.0 in | Wt 158.0 lb

## 2012-08-20 DIAGNOSIS — K222 Esophageal obstruction: Secondary | ICD-10-CM | POA: Diagnosis not present

## 2012-08-20 DIAGNOSIS — R109 Unspecified abdominal pain: Secondary | ICD-10-CM | POA: Diagnosis not present

## 2012-08-20 DIAGNOSIS — K219 Gastro-esophageal reflux disease without esophagitis: Secondary | ICD-10-CM | POA: Diagnosis not present

## 2012-08-20 DIAGNOSIS — R198 Other specified symptoms and signs involving the digestive system and abdomen: Secondary | ICD-10-CM | POA: Diagnosis not present

## 2012-08-20 DIAGNOSIS — K5730 Diverticulosis of large intestine without perforation or abscess without bleeding: Secondary | ICD-10-CM | POA: Diagnosis not present

## 2012-08-20 DIAGNOSIS — R0989 Other specified symptoms and signs involving the circulatory and respiratory systems: Secondary | ICD-10-CM | POA: Diagnosis not present

## 2012-08-20 DIAGNOSIS — I10 Essential (primary) hypertension: Secondary | ICD-10-CM | POA: Diagnosis not present

## 2012-08-20 DIAGNOSIS — K648 Other hemorrhoids: Secondary | ICD-10-CM | POA: Diagnosis not present

## 2012-08-20 DIAGNOSIS — E669 Obesity, unspecified: Secondary | ICD-10-CM | POA: Diagnosis not present

## 2012-08-20 DIAGNOSIS — R0609 Other forms of dyspnea: Secondary | ICD-10-CM | POA: Diagnosis not present

## 2012-08-20 DIAGNOSIS — D126 Benign neoplasm of colon, unspecified: Secondary | ICD-10-CM

## 2012-08-20 MED ORDER — SODIUM CHLORIDE 0.9 % IV SOLN: 500.0000 mL | INTRAVENOUS | Status: DC

## 2012-08-20 NOTE — Op Note (Signed)
[facility] [address] [place], [address]   COLONOSCOPY PROCEDURE REPORT  PATIENT: Maria, Suarez  MR#: [id number] BIRTHDATE: 1928/05/07 , 84  yrs. old GENDER: Female ENDOSCOPIST: Louis Meckel, MD REFERRED [id number] Cyndie Mull, M.D. PROCEDURE DATE:  08/20/2012 PROCEDURE:   Colonoscopy with snare polypectomy ASA CLASS:   Class II INDICATIONS:change in bowel habits. MEDICATIONS: MAC sedation, administered by CRNA and Fentanyl 150 mcg IV  DESCRIPTION OF PROCEDURE:   After the risks benefits and alternatives of the procedure were thoroughly explained, informed consent was obtained.  A digital rectal exam revealed no abnormalities of the rectum.   The LB [id number]  endoscope was introduced through the anus and advanced to the cecum, which was identified by both the appendix and ileocecal valve. No adverse events experienced.   The quality of the prep was Suprep excellent The instrument was then slowly withdrawn as the colon was fully examined.      COLON FINDINGS: A sessile polyp was found at the cecum.  A polypectomy was performed with a cold snare.  The resection was complete and the polyp tissue was completely retrieved.   There was severe diverticulosis noted with associated luminal narrowing, colonic spasm and muscular hypertrophy.   The colon mucosa was otherwise normal.  Retroflexed views revealed no abnormalities. The time to cecum=4 minutes 14 seconds.  Withdrawal time=8 minutes 0 seconds.  The scope was withdrawn and the procedure completed. COMPLICATIONS: There were no complications.  ENDOSCOPIC IMPRESSION: 1.   Sessile polyp was found at the cecum; polypectomy was performed with a cold snare 2.   There was severe diverticulosis noted 3.   The colon mucosa was otherwise normal  RECOMMENDATIONS: 1.  If the polyp(s) removed today are proven to be adenomatous (pre-cancerous) polyps, you will need a repeat colonoscopy in 5 years.   Otherwise you should continue to follow colorectal cancer screening guidelines for "routine risk" patients with colonoscopy in 10 years.  You will receive a letter within 1-2 weeks with the results of your biopsy as well as final recommendations.  Please call my office if you have not received a letter after 3 weeks. 2.  High fiber diet   eSigned:  Louis Meckel, MD 08/20/2012 9:51 AM   cc:

## 2012-08-20 NOTE — Patient Instructions (Addendum)
Impressions/Recommendations:  Polyp (handout given) Diverticulosis (handout given) High Fiber Diet (handout given)  YOU HAD AN ENDOSCOPIC PROCEDURE TODAY AT THE [facility]: Refer to the procedure report that was given to you for any specific questions about what was found during the examination.  If the procedure report does not answer your questions, please call your gastroenterologist to clarify.  If you requested that your care partner not be given the details of your procedure findings, then the procedure report has been included in a sealed envelope for you to review at your convenience later.  YOU SHOULD EXPECT: Some feelings of bloating in the abdomen. Passage of more gas than usual.  Walking can help get rid of the air that was put into your GI tract during the procedure and reduce the bloating. If you had a lower endoscopy (such as a colonoscopy or flexible sigmoidoscopy) you may notice spotting of blood in your stool or on the toilet paper. If you underwent a bowel prep for your procedure, then you may not have a normal bowel movement for a few days.  DIET: Your first meal following the procedure should be a light meal and then it is ok to progress to your normal diet.  A half-sandwich or bowl of soup is an example of a good first meal.  Heavy or fried foods are harder to digest and may make you feel nauseous or bloated.  Likewise meals heavy in dairy and vegetables can cause extra gas to form and this can also increase the bloating.  Drink plenty of fluids but you should avoid alcoholic beverages for 24 hours.  ACTIVITY: Your care partner should take you home directly after the procedure.  You should plan to take it easy, moving slowly for the rest of the day.  You can resume normal activity the day after the procedure however you should NOT DRIVE or use heavy machinery for 24 hours (because of the sedation medicines used during the test).    SYMPTOMS TO REPORT IMMEDIATELY: A  gastroenterologist can be reached at any hour.  During normal business hours, 8:30 AM to 5:00 PM Monday through Friday, call [phone].  After hours and on weekends, please call the GI answering service at [phone] who will take a message and have the physician on call contact you.   Following lower endoscopy (colonoscopy or flexible sigmoidoscopy):  Excessive amounts of blood in the stool  Significant tenderness or worsening of abdominal pains  Swelling of the abdomen that is new, acute  Fever of 100F or higher   FOLLOW UP: If any biopsies were taken you will be contacted by phone or by letter within the next 1-3 weeks.  Call your gastroenterologist if you have not heard about the biopsies in 3 weeks.  Our staff will call the home number listed on your records the next business day following your procedure to check on you and address any questions or concerns that you may have at that time regarding the information given to you following your procedure. This is a courtesy call and so if there is no answer at the home number and we have not heard from you through the emergency physician on call, we will assume that you have returned to your regular daily activities without incident.  SIGNATURES/CONFIDENTIALITY: You and/or your care partner have signed paperwork which will be entered into your electronic medical record.  These signatures attest to the fact that that the information above on your After Visit  Summary has been reviewed and is understood.  Full responsibility of the confidentiality of this discharge information lies with you and/or your care-partner.  

## 2012-08-20 NOTE — Progress Notes (Signed)
Patient did not experience any of the following events: a burn prior to discharge; a fall within the facility; wrong site/side/patient/procedure/implant event; or a hospital transfer or hospital admission upon discharge from the facility. (G8907) Patient did not have preoperative order for IV antibiotic SSI prophylaxis. (G8918)  

## 2012-08-21 ENCOUNTER — Telehealth: Payer: Self-pay | Admitting: *Deleted

## 2012-08-21 NOTE — Telephone Encounter (Signed)
  Follow up Call-  Call back number 08/20/2012  Post procedure Call Back phone  # [phone]  Permission to leave phone message Yes     Patient questions:  Do you have a fever, pain , or abdominal swelling? no Pain Score  0 *  Have you tolerated food without any problems? yes  Have you been able to return to your normal activities? yes  Do you have any questions about your discharge instructions: Diet   no Medications  no Follow up visit  no  Do you have questions or concerns about your Care? no  Actions: * If pain score is 4 or above: No action needed, pain <4.

## 2012-08-28 ENCOUNTER — Encounter: Payer: Self-pay | Admitting: Gastroenterology

## 2012-09-27 DIAGNOSIS — Z79899 Other long term (current) drug therapy: Secondary | ICD-10-CM | POA: Diagnosis not present

## 2012-09-27 DIAGNOSIS — R7301 Impaired fasting glucose: Secondary | ICD-10-CM | POA: Diagnosis not present

## 2012-09-27 DIAGNOSIS — I10 Essential (primary) hypertension: Secondary | ICD-10-CM | POA: Diagnosis not present

## 2012-10-10 DIAGNOSIS — Z23 Encounter for immunization: Secondary | ICD-10-CM | POA: Diagnosis not present

## 2012-10-10 DIAGNOSIS — I10 Essential (primary) hypertension: Secondary | ICD-10-CM | POA: Diagnosis not present

## 2012-10-10 DIAGNOSIS — J8410 Pulmonary fibrosis, unspecified: Secondary | ICD-10-CM | POA: Diagnosis not present

## 2012-10-10 DIAGNOSIS — R7301 Impaired fasting glucose: Secondary | ICD-10-CM | POA: Diagnosis not present

## 2012-10-10 DIAGNOSIS — Z0000 Encounter for general adult medical examination without abnormal findings: Secondary | ICD-10-CM | POA: Diagnosis not present

## 2012-10-28 DIAGNOSIS — I10 Essential (primary) hypertension: Secondary | ICD-10-CM | POA: Diagnosis not present

## 2012-10-28 DIAGNOSIS — R609 Edema, unspecified: Secondary | ICD-10-CM | POA: Diagnosis not present

## 2012-10-28 DIAGNOSIS — K5900 Constipation, unspecified: Secondary | ICD-10-CM | POA: Diagnosis not present

## 2012-10-28 DIAGNOSIS — K921 Melena: Secondary | ICD-10-CM | POA: Diagnosis not present

## 2012-11-19 DIAGNOSIS — J310 Chronic rhinitis: Secondary | ICD-10-CM | POA: Diagnosis not present

## 2012-11-19 DIAGNOSIS — R05 Cough: Secondary | ICD-10-CM | POA: Diagnosis not present

## 2012-11-21 DIAGNOSIS — Z780 Asymptomatic menopausal state: Secondary | ICD-10-CM | POA: Diagnosis not present

## 2013-01-17 DIAGNOSIS — I10 Essential (primary) hypertension: Secondary | ICD-10-CM | POA: Diagnosis not present

## 2013-01-17 DIAGNOSIS — J45909 Unspecified asthma, uncomplicated: Secondary | ICD-10-CM | POA: Diagnosis not present

## 2013-01-17 DIAGNOSIS — J309 Allergic rhinitis, unspecified: Secondary | ICD-10-CM | POA: Diagnosis not present

## 2013-02-03 DIAGNOSIS — I10 Essential (primary) hypertension: Secondary | ICD-10-CM | POA: Diagnosis not present

## 2013-02-03 DIAGNOSIS — J309 Allergic rhinitis, unspecified: Secondary | ICD-10-CM | POA: Diagnosis not present

## 2013-04-22 DIAGNOSIS — R5381 Other malaise: Secondary | ICD-10-CM | POA: Diagnosis not present

## 2013-04-22 DIAGNOSIS — R42 Dizziness and giddiness: Secondary | ICD-10-CM | POA: Diagnosis not present

## 2013-04-22 DIAGNOSIS — R7301 Impaired fasting glucose: Secondary | ICD-10-CM | POA: Diagnosis not present

## 2013-04-22 DIAGNOSIS — Z6830 Body mass index (BMI) 30.0-30.9, adult: Secondary | ICD-10-CM | POA: Diagnosis not present

## 2013-04-22 DIAGNOSIS — R609 Edema, unspecified: Secondary | ICD-10-CM | POA: Diagnosis not present

## 2013-04-22 DIAGNOSIS — R5383 Other fatigue: Secondary | ICD-10-CM | POA: Diagnosis not present

## 2013-04-22 DIAGNOSIS — I10 Essential (primary) hypertension: Secondary | ICD-10-CM | POA: Diagnosis not present

## 2013-05-29 DIAGNOSIS — Z1231 Encounter for screening mammogram for malignant neoplasm of breast: Secondary | ICD-10-CM | POA: Diagnosis not present

## 2013-08-21 DIAGNOSIS — Z23 Encounter for immunization: Secondary | ICD-10-CM | POA: Diagnosis not present

## 2013-10-10 DIAGNOSIS — M899 Disorder of bone, unspecified: Secondary | ICD-10-CM | POA: Diagnosis not present

## 2013-10-10 DIAGNOSIS — R7301 Impaired fasting glucose: Secondary | ICD-10-CM | POA: Diagnosis not present

## 2013-10-10 DIAGNOSIS — I10 Essential (primary) hypertension: Secondary | ICD-10-CM | POA: Diagnosis not present

## 2013-10-22 DIAGNOSIS — Z1331 Encounter for screening for depression: Secondary | ICD-10-CM | POA: Diagnosis not present

## 2013-10-22 DIAGNOSIS — Z0000 Encounter for general adult medical examination without abnormal findings: Secondary | ICD-10-CM | POA: Diagnosis not present

## 2013-10-22 DIAGNOSIS — C50919 Malignant neoplasm of unspecified site of unspecified female breast: Secondary | ICD-10-CM | POA: Diagnosis not present

## 2013-10-22 DIAGNOSIS — R7301 Impaired fasting glucose: Secondary | ICD-10-CM | POA: Diagnosis not present

## 2013-10-22 DIAGNOSIS — I10 Essential (primary) hypertension: Secondary | ICD-10-CM | POA: Diagnosis not present

## 2013-10-22 DIAGNOSIS — J8410 Pulmonary fibrosis, unspecified: Secondary | ICD-10-CM | POA: Diagnosis not present

## 2013-10-22 DIAGNOSIS — Z23 Encounter for immunization: Secondary | ICD-10-CM | POA: Diagnosis not present

## 2013-10-22 DIAGNOSIS — J45909 Unspecified asthma, uncomplicated: Secondary | ICD-10-CM | POA: Diagnosis not present

## 2013-10-22 DIAGNOSIS — R609 Edema, unspecified: Secondary | ICD-10-CM | POA: Diagnosis not present

## 2013-10-22 DIAGNOSIS — M899 Disorder of bone, unspecified: Secondary | ICD-10-CM | POA: Diagnosis not present

## 2013-11-24 DIAGNOSIS — I10 Essential (primary) hypertension: Secondary | ICD-10-CM | POA: Diagnosis not present

## 2014-01-05 DIAGNOSIS — I10 Essential (primary) hypertension: Secondary | ICD-10-CM | POA: Diagnosis not present

## 2014-01-05 DIAGNOSIS — Z6831 Body mass index (BMI) 31.0-31.9, adult: Secondary | ICD-10-CM | POA: Diagnosis not present

## 2014-01-14 DIAGNOSIS — Z6831 Body mass index (BMI) 31.0-31.9, adult: Secondary | ICD-10-CM | POA: Diagnosis not present

## 2014-01-14 DIAGNOSIS — M25519 Pain in unspecified shoulder: Secondary | ICD-10-CM | POA: Diagnosis not present

## 2014-01-14 DIAGNOSIS — M7710 Lateral epicondylitis, unspecified elbow: Secondary | ICD-10-CM | POA: Diagnosis not present

## 2014-02-02 DIAGNOSIS — M25519 Pain in unspecified shoulder: Secondary | ICD-10-CM | POA: Diagnosis not present

## 2014-03-02 DIAGNOSIS — M25519 Pain in unspecified shoulder: Secondary | ICD-10-CM | POA: Diagnosis not present

## 2014-04-27 DIAGNOSIS — M899 Disorder of bone, unspecified: Secondary | ICD-10-CM | POA: Diagnosis not present

## 2014-04-27 DIAGNOSIS — M79609 Pain in unspecified limb: Secondary | ICD-10-CM | POA: Diagnosis not present

## 2014-04-27 DIAGNOSIS — M7730 Calcaneal spur, unspecified foot: Secondary | ICD-10-CM | POA: Diagnosis not present

## 2014-04-27 DIAGNOSIS — Z6831 Body mass index (BMI) 31.0-31.9, adult: Secondary | ICD-10-CM | POA: Diagnosis not present

## 2014-04-27 DIAGNOSIS — J8410 Pulmonary fibrosis, unspecified: Secondary | ICD-10-CM | POA: Diagnosis not present

## 2014-04-27 DIAGNOSIS — I10 Essential (primary) hypertension: Secondary | ICD-10-CM | POA: Diagnosis not present

## 2014-04-27 DIAGNOSIS — M949 Disorder of cartilage, unspecified: Secondary | ICD-10-CM | POA: Diagnosis not present

## 2014-04-27 DIAGNOSIS — R7301 Impaired fasting glucose: Secondary | ICD-10-CM | POA: Diagnosis not present

## 2014-04-27 DIAGNOSIS — M25519 Pain in unspecified shoulder: Secondary | ICD-10-CM | POA: Diagnosis not present

## 2014-04-30 DIAGNOSIS — S93609A Unspecified sprain of unspecified foot, initial encounter: Secondary | ICD-10-CM | POA: Diagnosis not present

## 2014-04-30 DIAGNOSIS — I10 Essential (primary) hypertension: Secondary | ICD-10-CM | POA: Diagnosis not present

## 2014-04-30 DIAGNOSIS — M129 Arthropathy, unspecified: Secondary | ICD-10-CM | POA: Diagnosis not present

## 2014-05-11 ENCOUNTER — Ambulatory Visit: Payer: Self-pay | Admitting: Podiatry

## 2014-05-21 DIAGNOSIS — S93609A Unspecified sprain of unspecified foot, initial encounter: Secondary | ICD-10-CM | POA: Diagnosis not present

## 2014-05-21 DIAGNOSIS — M129 Arthropathy, unspecified: Secondary | ICD-10-CM | POA: Diagnosis not present

## 2014-05-21 DIAGNOSIS — I10 Essential (primary) hypertension: Secondary | ICD-10-CM | POA: Diagnosis not present

## 2014-06-09 DIAGNOSIS — Z853 Personal history of malignant neoplasm of breast: Secondary | ICD-10-CM | POA: Diagnosis not present

## 2014-06-09 DIAGNOSIS — Z1231 Encounter for screening mammogram for malignant neoplasm of breast: Secondary | ICD-10-CM | POA: Diagnosis not present

## 2014-07-10 DIAGNOSIS — R7301 Impaired fasting glucose: Secondary | ICD-10-CM | POA: Diagnosis not present

## 2014-07-10 DIAGNOSIS — Z1331 Encounter for screening for depression: Secondary | ICD-10-CM | POA: Diagnosis not present

## 2014-07-10 DIAGNOSIS — Z6830 Body mass index (BMI) 30.0-30.9, adult: Secondary | ICD-10-CM | POA: Diagnosis not present

## 2014-07-10 DIAGNOSIS — I10 Essential (primary) hypertension: Secondary | ICD-10-CM | POA: Diagnosis not present

## 2014-07-10 DIAGNOSIS — R0602 Shortness of breath: Secondary | ICD-10-CM | POA: Diagnosis not present

## 2014-07-10 DIAGNOSIS — R609 Edema, unspecified: Secondary | ICD-10-CM | POA: Diagnosis not present

## 2014-07-10 DIAGNOSIS — Z79899 Other long term (current) drug therapy: Secondary | ICD-10-CM | POA: Diagnosis not present

## 2014-07-10 DIAGNOSIS — J8410 Pulmonary fibrosis, unspecified: Secondary | ICD-10-CM | POA: Diagnosis not present

## 2014-07-20 ENCOUNTER — Other Ambulatory Visit (HOSPITAL_COMMUNITY): Payer: Self-pay | Admitting: Internal Medicine

## 2014-07-20 ENCOUNTER — Ambulatory Visit (HOSPITAL_COMMUNITY): Payer: Medicare Other | Attending: Internal Medicine

## 2014-07-20 DIAGNOSIS — R0602 Shortness of breath: Secondary | ICD-10-CM | POA: Diagnosis not present

## 2014-07-20 DIAGNOSIS — C50919 Malignant neoplasm of unspecified site of unspecified female breast: Secondary | ICD-10-CM | POA: Diagnosis not present

## 2014-07-20 DIAGNOSIS — K219 Gastro-esophageal reflux disease without esophagitis: Secondary | ICD-10-CM | POA: Insufficient documentation

## 2014-07-20 DIAGNOSIS — J8410 Pulmonary fibrosis, unspecified: Secondary | ICD-10-CM | POA: Diagnosis not present

## 2014-07-20 DIAGNOSIS — J45909 Unspecified asthma, uncomplicated: Secondary | ICD-10-CM | POA: Diagnosis not present

## 2014-07-20 DIAGNOSIS — I10 Essential (primary) hypertension: Secondary | ICD-10-CM | POA: Insufficient documentation

## 2014-07-20 DIAGNOSIS — G4733 Obstructive sleep apnea (adult) (pediatric): Secondary | ICD-10-CM | POA: Diagnosis not present

## 2014-07-20 DIAGNOSIS — IMO0002 Reserved for concepts with insufficient information to code with codable children: Secondary | ICD-10-CM | POA: Insufficient documentation

## 2014-07-20 NOTE — Progress Notes (Signed)
2D Echo completed. 07/20/2014

## 2014-07-30 ENCOUNTER — Telehealth: Payer: Self-pay | Admitting: Gastroenterology

## 2014-07-30 ENCOUNTER — Emergency Department (HOSPITAL_COMMUNITY): Payer: Medicare Other

## 2014-07-30 ENCOUNTER — Encounter: Payer: Self-pay | Admitting: *Deleted

## 2014-07-30 ENCOUNTER — Emergency Department (HOSPITAL_COMMUNITY)
Admission: EM | Admit: 2014-07-30 | Discharge: 2014-07-30 | Disposition: A | Discharge status: Home / Self Care | Payer: Medicare Other | Attending: Emergency Medicine | Admitting: Emergency Medicine

## 2014-07-30 DIAGNOSIS — F3289 Other specified depressive episodes: Secondary | ICD-10-CM | POA: Diagnosis not present

## 2014-07-30 DIAGNOSIS — Z853 Personal history of malignant neoplasm of breast: Secondary | ICD-10-CM | POA: Diagnosis not present

## 2014-07-30 DIAGNOSIS — I10 Essential (primary) hypertension: Secondary | ICD-10-CM | POA: Diagnosis not present

## 2014-07-30 DIAGNOSIS — E669 Obesity, unspecified: Secondary | ICD-10-CM | POA: Insufficient documentation

## 2014-07-30 DIAGNOSIS — Z90710 Acquired absence of both cervix and uterus: Secondary | ICD-10-CM | POA: Insufficient documentation

## 2014-07-30 DIAGNOSIS — R1010 Upper abdominal pain, unspecified: Secondary | ICD-10-CM

## 2014-07-30 DIAGNOSIS — Z79899 Other long term (current) drug therapy: Secondary | ICD-10-CM | POA: Diagnosis not present

## 2014-07-30 DIAGNOSIS — F329 Major depressive disorder, single episode, unspecified: Secondary | ICD-10-CM | POA: Diagnosis not present

## 2014-07-30 DIAGNOSIS — Z860100 Personal history of colon polyps, unspecified: Secondary | ICD-10-CM | POA: Insufficient documentation

## 2014-07-30 DIAGNOSIS — Z9089 Acquired absence of other organs: Secondary | ICD-10-CM | POA: Diagnosis not present

## 2014-07-30 DIAGNOSIS — R110 Nausea: Secondary | ICD-10-CM | POA: Diagnosis not present

## 2014-07-30 DIAGNOSIS — M549 Dorsalgia, unspecified: Secondary | ICD-10-CM | POA: Insufficient documentation

## 2014-07-30 DIAGNOSIS — R109 Unspecified abdominal pain: Secondary | ICD-10-CM | POA: Insufficient documentation

## 2014-07-30 DIAGNOSIS — K219 Gastro-esophageal reflux disease without esophagitis: Secondary | ICD-10-CM | POA: Insufficient documentation

## 2014-07-30 DIAGNOSIS — M129 Arthropathy, unspecified: Secondary | ICD-10-CM | POA: Insufficient documentation

## 2014-07-30 DIAGNOSIS — Z7982 Long term (current) use of aspirin: Secondary | ICD-10-CM | POA: Diagnosis not present

## 2014-07-30 DIAGNOSIS — Z791 Long term (current) use of non-steroidal anti-inflammatories (NSAID): Secondary | ICD-10-CM | POA: Diagnosis not present

## 2014-07-30 DIAGNOSIS — R1084 Generalized abdominal pain: Secondary | ICD-10-CM | POA: Diagnosis not present

## 2014-07-30 HISTORY — DX: Polyp of colon: K63.5

## 2014-07-30 LAB — CBC WITH DIFFERENTIAL/PLATELET
Basophils Absolute: 0 10*3/uL (ref 0.0–0.1)
Basophils Relative: 0 % (ref 0–1)
Eosinophils Absolute: 0.1 10*3/uL (ref 0.0–0.7)
Eosinophils Relative: 1 % (ref 0–5)
HEMATOCRIT: 39.2 % (ref 36.0–46.0)
HEMOGLOBIN: 13.4 g/dL (ref 12.0–15.0)
LYMPHS ABS: 1.4 10*3/uL (ref 0.7–4.0)
LYMPHS PCT: 14 % (ref 12–46)
MCH: 30.5 pg (ref 26.0–34.0)
MCHC: 34.2 g/dL (ref 30.0–36.0)
MCV: 89.1 fL (ref 78.0–100.0)
MONO ABS: 1 10*3/uL (ref 0.1–1.0)
MONOS PCT: 11 % (ref 3–12)
NEUTROS ABS: 6.9 10*3/uL (ref 1.7–7.7)
Neutrophils Relative %: 74 % (ref 43–77)
Platelets: 217 10*3/uL (ref 150–400)
RBC: 4.4 MIL/uL (ref 3.87–5.11)
RDW: 14.3 % (ref 11.5–15.5)
WBC: 9.4 10*3/uL (ref 4.0–10.5)

## 2014-07-30 LAB — URINALYSIS, ROUTINE W REFLEX MICROSCOPIC
BILIRUBIN URINE: NEGATIVE
Glucose, UA: NEGATIVE mg/dL
Hgb urine dipstick: NEGATIVE
KETONES UR: NEGATIVE mg/dL
Leukocytes, UA: NEGATIVE
NITRITE: NEGATIVE
Protein, ur: NEGATIVE mg/dL
Specific Gravity, Urine: 1.011 (ref 1.005–1.030)
Urobilinogen, UA: 0.2 mg/dL (ref 0.0–1.0)
pH: 6.5 (ref 5.0–8.0)

## 2014-07-30 LAB — COMPREHENSIVE METABOLIC PANEL
ALT: 27 U/L (ref 0–35)
AST: 29 U/L (ref 0–37)
Albumin: 3.2 g/dL — ABNORMAL LOW (ref 3.5–5.2)
Alkaline Phosphatase: 121 U/L — ABNORMAL HIGH (ref 39–117)
Anion gap: 13 (ref 5–15)
BILIRUBIN TOTAL: 0.7 mg/dL (ref 0.3–1.2)
BUN: 13 mg/dL (ref 6–23)
CALCIUM: 9.9 mg/dL (ref 8.4–10.5)
CHLORIDE: 95 meq/L — AB (ref 96–112)
CO2: 25 mEq/L (ref 19–32)
Creatinine, Ser: 0.69 mg/dL (ref 0.50–1.10)
GFR calc Af Amer: >90 mL/min (ref >90)
GFR, EST NON AFRICAN AMERICAN: 78 mL/min — AB (ref >90)
Glucose, Bld: 91 mg/dL (ref 70–99)
Potassium: 5.2 mEq/L (ref 3.7–5.3)
Sodium: 133 mEq/L — ABNORMAL LOW (ref 137–147)
Total Protein: 7.1 g/dL (ref 6.0–8.3)

## 2014-07-30 LAB — LIPASE, BLOOD: Lipase: 43 U/L (ref 11–59)

## 2014-07-30 MED ORDER — GI COCKTAIL ~~LOC~~
30.0000 mL | Freq: Once | ORAL | Status: AC
  Administered 2014-07-30: 30 mL via ORAL
  Filled 2014-07-30: qty 30

## 2014-07-30 MED ORDER — ONDANSETRON HCL 4 MG/2ML IJ SOLN
4.0000 mg | Freq: Once | INTRAMUSCULAR | Status: AC
  Administered 2014-07-30: 4 mg via INTRAVENOUS
  Filled 2014-07-30: qty 2

## 2014-07-30 MED ORDER — IOHEXOL 300 MG/ML  SOLN
100.0000 mL | Freq: Once | INTRAMUSCULAR | Status: AC | PRN
  Administered 2014-07-30: 100 mL via INTRAVENOUS

## 2014-07-30 MED ORDER — MORPHINE SULFATE 2 MG/ML IJ SOLN
2.0000 mg | Freq: Once | INTRAMUSCULAR | Status: AC
  Administered 2014-07-30: 2 mg via INTRAVENOUS
  Filled 2014-07-30: qty 1

## 2014-07-30 NOTE — ED Notes (Signed)
Pt reports generalized abd pain with nausea x 5 days, reports pain is worse after eating.  Pt reports has an appt to see her GI MD Monday but is not able to wait until then.  Pt reports she takes zantac for GERD.  Pt also reports she hasn't taken her BP med for 5 days as well.  Pt's BP in triage is elevated.

## 2014-07-30 NOTE — Telephone Encounter (Signed)
Spoke with the patient. She c/o a squeezing discomfort of her abdomen. She denies any burping, belching or burning and continues her PPI. She was recently taken off Celebrex. She subsequently had a flare of her DDD which was treated with prednisone taper and put on Meloxicam. She reports this is when she started having a "grumbling of the belly" with increased gas, loose stool then constipation. She has stopped the Meloxicam, taken Pepto-Bismol and also tried Kaopectate without improvement.  Afebrile. No vomiting.Has not noted any blood in the bowel movements. Appointment scheduled. She is advised to go to the ER is she were to have a sudden increase of pain. She will also contact her PCP with her symptoms.

## 2014-07-30 NOTE — Discharge Instructions (Signed)
Abdominal Pain °Many things can cause abdominal pain. Usually, abdominal pain is not caused by a disease and will improve without treatment. It can often be observed and treated at home. Your health care provider will do a physical exam and possibly order blood tests and X-rays to help determine the seriousness of your pain. However, in many cases, more time must pass before a clear cause of the pain can be found. Before that point, your health care provider may not know if you need more testing or further treatment. °HOME CARE INSTRUCTIONS  °Monitor your abdominal pain for any changes. The following actions may help to alleviate any discomfort you are experiencing: °· Only take over-the-counter or prescription medicines as directed by your health care provider. °· Do not take laxatives unless directed to do so by your health care provider. °· Try a clear liquid diet (broth, tea, or water) as directed by your health care provider. Slowly move to a bland diet as tolerated. °SEEK MEDICAL CARE IF: °· You have unexplained abdominal pain. °· You have abdominal pain associated with nausea or diarrhea. °· You have pain when you urinate or have a bowel movement. °· You experience abdominal pain that wakes you in the night. °· You have abdominal pain that is worsened or improved by eating food. °· You have abdominal pain that is worsened with eating fatty foods. °· You have a fever. °SEEK IMMEDIATE MEDICAL CARE IF:  °· Your pain does not go away within 2 hours. °· You keep throwing up (vomiting). °· Your pain is felt only in portions of the abdomen, such as the right side or the left lower portion of the abdomen. °· You pass bloody or black tarry stools. °MAKE SURE YOU: °· Understand these instructions.   °· Will watch your condition.   °· Will get help right away if you are not doing well or get worse.   °Document Released: 08/02/2005 Document Revised: 10/28/2013 Document Reviewed: 07/02/2013 °ExitCare® Patient Information  ©2015 [employer]. This information is not intended to replace advice given to you by your health care provider. Make sure you discuss any questions you have with your health care provider. ° °Gastritis, Adult °Gastritis is soreness and swelling (inflammation) of the lining of the stomach. Gastritis can develop as a sudden onset (acute) or long-term (chronic) condition. If gastritis is not treated, it can lead to stomach bleeding and ulcers. °CAUSES  °Gastritis occurs when the stomach lining is weak or damaged. Digestive juices from the stomach then inflame the weakened stomach lining. The stomach lining may be weak or damaged due to viral or bacterial infections. One common bacterial infection is the Helicobacter pylori infection. Gastritis can also result from excessive alcohol consumption, taking certain medicines, or having too much acid in the stomach.  °SYMPTOMS  °In some cases, there are no symptoms. When symptoms are present, they may include: °· Pain or a burning sensation in the upper abdomen. °· Nausea. °· Vomiting. °· An uncomfortable feeling of fullness after eating. °DIAGNOSIS  °Your caregiver may suspect you have gastritis based on your symptoms and a physical exam. To determine the cause of your gastritis, your caregiver may perform the following: °· Blood or stool tests to check for the H pylori bacterium. °· Gastroscopy. A thin, flexible tube (endoscope) is passed down the esophagus and into the stomach. The endoscope has a light and camera on the end. Your caregiver uses the endoscope to view the inside of the stomach. °· Taking a tissue sample (biopsy)   from the stomach to examine under a microscope. °TREATMENT  °Depending on the cause of your gastritis, medicines may be prescribed. If you have a bacterial infection, such as an H pylori infection, antibiotics may be given. If your gastritis is caused by too much acid in the stomach, H2 blockers or antacids may be given. Your caregiver may  recommend that you stop taking aspirin, ibuprofen, or other nonsteroidal anti-inflammatory drugs (NSAIDs). °HOME CARE INSTRUCTIONS °· Only take over-the-counter or prescription medicines as directed by your caregiver. °· If you were given antibiotic medicines, take them as directed. Finish them even if you start to feel better. °· Drink enough fluids to keep your urine clear or pale yellow. °· Avoid foods and drinks that make your symptoms worse, such as: °¨ Caffeine or alcoholic drinks. °¨ Chocolate. °¨ Peppermint or mint flavorings. °¨ Garlic and onions. °¨ Spicy foods. °¨ Citrus fruits, such as oranges, lemons, or limes. °¨ Tomato-based foods such as sauce, chili, salsa, and pizza. °¨ Fried and fatty foods. °· Eat small, frequent meals instead of large meals. °SEEK IMMEDIATE MEDICAL CARE IF:  °· You have black or dark red stools. °· You vomit blood or material that looks like coffee grounds. °· You are unable to keep fluids down. °· Your abdominal pain gets worse. °· You have a fever. °· You do not feel better after 1 week. °· You have any other questions or concerns. °MAKE SURE YOU: °· Understand these instructions. °· Will watch your condition. °· Will get help right away if you are not doing well or get worse. °Document Released: 10/17/2001 Document Revised: 04/23/2012 Document Reviewed: 12/06/2011 °ExitCare® Patient Information ©2015 [employer]. This information is not intended to replace advice given to you by your health care provider. Make sure you discuss any questions you have with your health care provider. ° °

## 2014-07-30 NOTE — ED Provider Notes (Signed)
CSN: [id number]     Arrival date & time 07/30/14  1702 History   First MD Initiated Contact with Patient 07/30/14 1734     Chief Complaint  Patient presents with  . Abdominal Pain  . Nausea     (Consider location/radiation/quality/duration/timing/severity/associated sxs/prior Treatment) HPI 80 year old female presents with abdominal pain. This is been gone for the past 5 days. The pain has most noticeable about an hour after eating. She describes as an aching type pain. Denies any chest pain or shortness of breath. She called her gastroenterologist, Dr. Deatra Ina, but they cannot see her until early next week. Denies a fevers or chills. No urinary symptoms. The patient describes the pain is all over her entire abdomen. There is no focal spot that the pain is worse. No radiation of the pain to her back. She has felt nauseous but has not vomited. She alternates between constipation and diarrhea which is not new. She has been taking Tylenol which does seem to help. This all seemed to start when she had abdominal pain earlier in the month and had her Celebrex stopped. She was then put on prednisone because his culture back to flare. The pain has improved since she arrived and she would describe as mild at this time since she started laying down.  Past Medical History  Diagnosis Date  . Dyspnea   . Diverticulosis 2008  . Internal hemorrhoids   . Esophageal stricture   . Arthritis   . Breast cancer 1969  . GERD (gastroesophageal reflux disease)   . Gallstones   . Depression   . HLD (hyperlipidemia)   . HTN (hypertension)   . Obesity   . Colon polyp 2013    TUBULAR ADENOMA (X1   Past Surgical History  Procedure Laterality Date  . Abdominal hysterectomy    . Cholecystectomy    . Mastectomy      right  . Appendectomy    . Foot surgery    . Breast surgery     Family History  Problem Relation Age of Onset  . Heart disease Mother   . Cancer Mother     bladder  . Esophageal cancer  Brother   . Prostate cancer Brother   . Heart disease Brother   . Cancer Brother     sarcoma   History  Substance Use Topics  . Smoking status: Never Smoker   . Smokeless tobacco: Never Used  . Alcohol Use: 4.2 oz/week    7 Glasses of wine per week     Comment: wine once a day    OB History   Grav Para Term Preterm Abortions TAB SAB Ect Mult Living                 Review of Systems  Constitutional: Negative for fever.  Respiratory: Negative for shortness of breath.   Cardiovascular: Negative for chest pain.  Gastrointestinal: Positive for nausea and abdominal pain. Negative for vomiting.  Genitourinary: Negative for dysuria.  Musculoskeletal: Positive for back pain (chronic back pain).  All other systems reviewed and are negative.     Allergies  Antihistamines, loratadine-type; Codeine; Darvocet; and Moxifloxacin  Home Medications   Prior to Admission medications   Medication Sig Start Date End Date Taking? Authorizing Provider  aspirin 81 MG tablet Take 160 mg by mouth daily.    Historical Provider, MD  celecoxib (CELEBREX) 200 MG capsule Take 200 mg by mouth daily.    Historical Provider, MD  estradiol (VIVELLE-DOT) 0.0375 MG/24HR Place  1 patch onto the skin 2 (two) times a week.    Historical Provider, MD  Lansoprazole (PREVACID 24HR PO) Take by mouth 1 day or 1 dose.    Historical Provider, MD  Multiple Vitamin (MULTIVITAMIN) capsule Take 1 capsule by mouth daily.    Historical Provider, MD  Probiotic Product (Duck) CAPS Take 1 capsule by mouth daily.    Historical Provider, MD  sertraline (ZOLOFT) 100 MG tablet Take 100 mg by mouth daily.    Historical Provider, MD  traMADol (ULTRAM) 50 MG tablet Take 50 mg by mouth every 6 (six) hours as needed.    Historical Provider, MD   BP 213/64  Pulse 76  Temp(Src) 98.9 F (37.2 C) (Oral)  Resp 19  SpO2 100% Physical Exam  Vitals reviewed. Constitutional: She is oriented to person, place, and time.  She appears well-developed and well-nourished. No distress.  HENT:  Head: Normocephalic and atraumatic.  Right Ear: External ear normal.  Left Ear: External ear normal.  Nose: Nose normal.  Eyes: Right eye exhibits no discharge. Left eye exhibits no discharge.  Cardiovascular: Normal rate, regular rhythm and normal heart sounds.   Pulmonary/Chest: Effort normal and breath sounds normal.  Abdominal: Soft. There is tenderness (mild diffuse tenderness). There is no rebound and no guarding.  Neurological: She is alert and oriented to person, place, and time.  Skin: Skin is warm and dry. She is not diaphoretic.    ED Course  Procedures (including critical care time) Labs Review Labs Reviewed  COMPREHENSIVE METABOLIC PANEL - Abnormal; Notable for the following:    Sodium 133 (*)    Chloride 95 (*)    Albumin 3.2 (*)    Alkaline Phosphatase 121 (*)    GFR calc non Af Amer 78 (*)    All other components within normal limits  CBC WITH DIFFERENTIAL  LIPASE, BLOOD  URINALYSIS, ROUTINE W REFLEX MICROSCOPIC    Imaging Review Ct Abdomen Pelvis W Contrast  07/30/2014   CLINICAL DATA:  Generalized abdomen pain with nausea for 5 days.  EXAM: CT ABDOMEN AND PELVIS WITH CONTRAST  TECHNIQUE: Multidetector CT imaging of the abdomen and pelvis was performed using the standard protocol following bolus administration of intravenous contrast.  CONTRAST:  145mL OMNIPAQUE IOHEXOL 300 MG/ML  SOLN  COMPARISON:  February 18, 2012  FINDINGS: There is a a 8.2 cm liver cyst unchanged compared to prior exam. The liver is otherwise normal. The patient is status post prior cholecystectomy. Spleen, pancreas, adrenal glands and kidneys are normal. There is no hydronephrosis bilaterally. There is atherosclerosis of the abdominal aorta without aneurysmal dilatation. There is no abdominal lymphadenopathy. There is no small bowel obstruction. There is diverticulosis of colon without diverticulitis. The patient is status post  prior appendectomy.  Fluid-filled bladder is normal. The patient is status post hysterectomy. Degenerative joint changes of the spine are noted. There is peripheral scar/fibrosis of the lung bases.  IMPRESSION: No acute abnormality identified in the abdomen and pelvis. No evidence of bowel obstruction or diverticulitis.   Electronically Signed   By: Abelardo Diesel M.D.   On: 07/30/2014 19:52     EKG Interpretation None      MDM   Final diagnoses:  Pain of upper abdomen    80 year old female with intermittent abdominal pain this seems to mostly come on with eating. This is most consistent with an ulcer versus gastritis given the timing of her pain. No blood in her stool or anemia. Patient's pain  is well controlled here. Workup here is benign. Given that she is tender in her abdomen I have very low suspicion that this is atypical ACS. We'll treat with PPI at home and will have her continue to follow closely with her GI specialist.    Ephraim Hamburger, MD 07/30/14 2253

## 2014-08-03 ENCOUNTER — Ambulatory Visit (INDEPENDENT_AMBULATORY_CARE_PROVIDER_SITE_OTHER): Payer: Medicare Other | Admitting: Nurse Practitioner

## 2014-08-03 ENCOUNTER — Encounter: Payer: Self-pay | Admitting: Nurse Practitioner

## 2014-08-03 VITALS — BP 148/60 | HR 76 | Ht 62.0 in | Wt 162.0 lb

## 2014-08-03 DIAGNOSIS — R109 Unspecified abdominal pain: Secondary | ICD-10-CM | POA: Diagnosis not present

## 2014-08-03 NOTE — Patient Instructions (Signed)

## 2014-08-03 NOTE — Progress Notes (Signed)
Reviewed and agree with management. Robert D. Kaplan, M.D., FACG  

## 2014-08-03 NOTE — Progress Notes (Signed)
History of Present Illness:  Patient is an 80 year old female known to Dr. Deatra Ina. She was last seen September 2013 for evaluation of lower abdomina pain / l change in stool caliber.  CT scan in ED a few months prior to that visit was unremarkable . For further evaluation patient underwent colonoscopy with polypectomy October 2013. Severe diverticulosis was found. A sessile polyp (adenoma) was removed from the cecum.   Patient had been on Celebrex for 30 years. Because of lower extremity swelling her PCP discontinued the medication earlier this month. After stopping Celebrex patient had recurrent back pain almost immediately. She was given a short course of steroids and subsequently began taking meloxicam. After a few doses of meloxicam her abdominal pain escalated and patient went to the emergency department 4 days ago. The pain is generalized, affecting midabdomen and left lower quadrant In the ED a CT scan of the abdomen and pelvis with contrast wa negative for any acute abnormalities. Her alk phosphatase was minimally elevated, LFTs o/w normal. CBC normal. Patient was given morphine which helped her pain. She has continued to have generalized abdominal pain which seems a little worse on an empty stomach and relieved transiently by food. Her bowel movements seem to be relatively normal, she's having one soft stool a day. No weight loss but her appetite is diminished.    Current Medications, Allergies, Past Medical History, Past Surgical History, Family History and Social History were reviewed in [employer] record.  Studies:   Ct Abdomen Pelvis W Contrast  07/30/2014   CLINICAL DATA:  Generalized abdomen pain with nausea for 5 days.  EXAM: CT ABDOMEN AND PELVIS WITH CONTRAST  TECHNIQUE: Multidetector CT imaging of the abdomen and pelvis was performed using the standard protocol following bolus administration of intravenous contrast.  CONTRAST:  110m OMNIPAQUE IOHEXOL  300 MG/ML  SOLN  COMPARISON:  February 18, 2012  FINDINGS: There is a a 8.2 cm liver cyst unchanged compared to prior exam. The liver is otherwise normal. The patient is status post prior cholecystectomy. Spleen, pancreas, adrenal glands and kidneys are normal. There is no hydronephrosis bilaterally. There is atherosclerosis of the abdominal aorta without aneurysmal dilatation. There is no abdominal lymphadenopathy. There is no small bowel obstruction. There is diverticulosis of colon without diverticulitis. The patient is status post prior appendectomy.  Fluid-filled bladder is normal. The patient is status post hysterectomy. Degenerative joint changes of the spine are noted. There is peripheral scar/fibrosis of the lung bases.  IMPRESSION: No acute abnormality identified in the abdomen and pelvis. No evidence of bowel obstruction or diverticulitis.   Electronically Signed   By: WAbelardo DieselM.D.   On: 07/30/2014 19:52   Physical Exam: General: Pleasant, well developed , white female in no acute distress Head: Normocephalic and atraumatic Eyes:  sclerae anicteric, conjunctiva pink  Ears: Normal auditory acuity Lungs: Clear throughout to auscultation Heart: Regular rate and rhythm Abdomen: Soft, non distended, non-tender. No masses, no hepatomegaly. Normal bowel sounds Musculoskeletal: Symmetrical with no gross deformities  Extremities: No edema  Neurological: Alert oriented x 4, grossly nonfocal Psychological:  Alert and cooperative. Normal mood and affect  Assessment and Recommendations:   176 80year old female with 2-3 episodes of generalized abdominal pain over the last several months. Patient is worried about gastrointestinal consequences of long-term NSAID use. Celebrex was discontinued earlier this month though she took it for 30 years. Her current episode of generalized abdominal pain began after a course of steroids  followed by meloxicam.  NSAID induced gastropathy/enteropathy is certainly  possible though the diffuse nature of her abdominal pain makes this seem unlikely. Having said that, she isn't improving and labs and colonoscopy were unrevealing. For further workup will schedule her for upper endoscopy. The benefits, risks, and potential complications of EGD with possible biopsies were discussed with the patient and she agrees to proceed.   Avoid NSAIDs for now. She has ultram at home  Continue PPI  2. Abnormal echo. PCP has referred her to cardiologist which she believes is on the basis of an abnormal echo. We spoke to PCP, Dr. Brigitte Pulse, and he is fine with patient undergoing sedation for EGD, no need for cardiac clearance. Echo shows LVEF of 55 to 60% with hyppokinesis of theinferior wall (base, mid). Doppler parameters are consistent with abnormal left ventricular relaxation.

## 2014-08-07 ENCOUNTER — Encounter: Payer: Self-pay | Admitting: Gastroenterology

## 2014-08-07 ENCOUNTER — Ambulatory Visit (AMBULATORY_SURGERY_CENTER): Payer: Medicare Other | Admitting: Gastroenterology

## 2014-08-07 VITALS — BP 160/71 | HR 74 | Temp 97.0°F | Resp 19 | Ht 62.0 in | Wt 162.0 lb

## 2014-08-07 DIAGNOSIS — R109 Unspecified abdominal pain: Secondary | ICD-10-CM

## 2014-08-07 DIAGNOSIS — K222 Esophageal obstruction: Secondary | ICD-10-CM

## 2014-08-07 DIAGNOSIS — K317 Polyp of stomach and duodenum: Secondary | ICD-10-CM | POA: Diagnosis not present

## 2014-08-07 DIAGNOSIS — I10 Essential (primary) hypertension: Secondary | ICD-10-CM | POA: Diagnosis not present

## 2014-08-07 DIAGNOSIS — D131 Benign neoplasm of stomach: Secondary | ICD-10-CM

## 2014-08-07 DIAGNOSIS — K2970 Gastritis, unspecified, without bleeding: Secondary | ICD-10-CM

## 2014-08-07 MED ORDER — SODIUM CHLORIDE 0.9 % IV SOLN: 500.0000 mL | INTRAVENOUS | Status: DC

## 2014-08-07 MED ORDER — HYOSCYAMINE SULFATE ER 0.375 MG PO TBCR: EXTENDED_RELEASE_TABLET | ORAL | Status: DC

## 2014-08-07 NOTE — Op Note (Signed)
[place]  [address] [place], [id number]   ENDOSCOPY PROCEDURE REPORT  PATIENT: [name], [name]  MR#: [id number] BIRTHDATE: 1928-05-30 , 72  yrs. old GENDER: female ENDOSCOPIST: Inda Castle, MD REFERRED BY:  Janalyn Rouse, M.D. PROCEDURE DATE:  08/07/2014 PROCEDURE:  EGD w/ biopsy ASA CLASS:     Class II INDICATIONS:  abdominal pain in upper left quadrant. MEDICATIONS: Monitored anesthesia care and Propofol 80 mg IV TOPICAL ANESTHETIC:  DESCRIPTION OF PROCEDURE: After the risks benefits and alternatives of the procedure were thoroughly explained, informed consent was obtained.  The LB [id number] endoscope was introduced through the mouth and advanced to the second portion of the duodenum , Without limitations.  The instrument was slowly withdrawn as the mucosa was fully examined.    ESOPHAGUS: There was a mild and benign appearing  nonobstructing stricture at the gastroesophageal junction.  The stricture was easily traversable.   Except for the findings listed the EGD was otherwise normal.   STOMACH: Gastritis (inflammation) was found in the gastric body.   Polyp(s) were found.   A flat polyp measuring 3 mm in size was found in the gastric antrum.  Multiple biopsies was performed.  Retroflexed views revealed no abnormalities.     The scope was then withdrawn from the patient and the procedure completed.  COMPLICATIONS: There were no immediate complications.  ENDOSCOPIC IMPRESSION: 1.   There was a stricture at the gastroesophageal junction 2.   Gastritis (inflammation) was found in the gastric body 3.   Polyp(s) were found 4.   Flat polyp measuring 3 mm in size was found in the gastric antrum; multiple biopsies was performed  No findings on endoscopy to explain abdominal pain  RECOMMENDATIONS: Await biopsy results hyomax prn abdominal pain  REPEAT EXAM:  eSigned:  Inda Castle, MD 08/07/2014 2:16 PM    CC:  PATIENT  NAME:  [name], [name] MR#: [id number]

## 2014-08-07 NOTE — Progress Notes (Signed)
Called to room to assist during endoscopic procedure.  Patient ID and intended procedure confirmed with present staff. Received instructions for my participation in the procedure from the performing physician.  

## 2014-08-07 NOTE — Patient Instructions (Signed)
YOU HAD AN ENDOSCOPIC PROCEDURE TODAY AT [facility]: Refer to the procedure report that was given to you for any specific questions about what was found during the examination.  If the procedure report does not answer your questions, please call your gastroenterologist to clarify.  If you requested that your care partner not be given the details of your procedure findings, then the procedure report has been included in a sealed envelope for you to review at your convenience later.  YOU SHOULD EXPECT: Some feelings of bloating in the abdomen. Passage of more gas than usual.  Walking can help get rid of the air that was put into your GI tract during the procedure and reduce the bloating. If you had a lower endoscopy (such as a colonoscopy or flexible sigmoidoscopy) you may notice spotting of blood in your stool or on the toilet paper. If you underwent a bowel prep for your procedure, then you may not have a normal bowel movement for a few days.  DIET: Your first meal following the procedure should be a light meal and then it is ok to progress to your normal diet.  A half-sandwich or bowl of soup is an example of a good first meal.  Heavy or fried foods are harder to digest and may make you feel nauseous or bloated.  Likewise meals heavy in dairy and vegetables can cause extra gas to form and this can also increase the bloating.  Drink plenty of fluids but you should avoid alcoholic beverages for 24 hours.  ACTIVITY: Your care partner should take you home directly after the procedure.  You should plan to take it easy, moving slowly for the rest of the day.  You can resume normal activity the day after the procedure however you should NOT DRIVE or use heavy machinery for 24 hours (because of the sedation medicines used during the test).    SYMPTOMS TO REPORT IMMEDIATELY: A gastroenterologist can be reached at any hour.  During normal business hours, 8:30 AM to 5:00 PM Monday through Friday,  call [phone].  After hours and on weekends, please call the GI answering service at [phone] who will take a message and have the physician on call contact you.     Following upper endoscopy (EGD)  Vomiting of blood or coffee ground material  New chest pain or pain under the shoulder blades  Painful or persistently difficult swallowing  New shortness of breath  Fever of 100F or higher  Black, tarry-looking stools  FOLLOW UP: If any biopsies were taken you will be contacted by phone or by letter within the next 1-3 weeks.  Call your gastroenterologist if you have not heard about the biopsies in 3 weeks.  Our staff will call the home number listed on your records the next business day following your procedure to check on you and address any questions or concerns that you may have at that time regarding the information given to you following your procedure. This is a courtesy call and so if there is no answer at the home number and we have not heard from you through the emergency physician on call, we will assume that you have returned to your regular daily activities without incident.  Stricture, gastritis informatin given.  Take hyomax as needed for abdominal pain.  Await biopsy results.  SIGNATURES/CONFIDENTIALITY: You and/or your care partner have signed paperwork which will be entered into your electronic medical record.  These signatures attest to the fact  that that the information above on your After Visit Summary has been reviewed and is understood.  Full responsibility of the confidentiality of this discharge information lies with you and/or your care-partner.

## 2014-08-07 NOTE — Progress Notes (Signed)
A/ox3 pleased with MAC, report to Jane RN 

## 2014-08-10 ENCOUNTER — Telehealth: Payer: Self-pay | Admitting: *Deleted

## 2014-08-10 NOTE — Telephone Encounter (Signed)
  Follow up Call-  Call back number 08/07/2014 08/20/2012  Post procedure Call Back phone  # [phone]  Permission to leave phone message Yes Yes     Patient questions:  Do you have a fever, pain , or abdominal swelling? No. Pain Score  0 *  Have you tolerated food without any problems? Yes.    Have you been able to return to your normal activities? Yes.    Do you have any questions about your discharge instructions: Diet   No. Medications  No. Follow up visit  No.  Do you have questions or concerns about your Care? No.  Actions: * If pain score is 4 or above: No action needed, pain <4.

## 2014-08-11 ENCOUNTER — Encounter: Payer: Self-pay | Admitting: Gastroenterology

## 2014-08-15 ENCOUNTER — Emergency Department (HOSPITAL_COMMUNITY): Payer: Medicare Other

## 2014-08-15 ENCOUNTER — Inpatient Hospital Stay (HOSPITAL_COMMUNITY)
Admission: EM | Admit: 2014-08-15 | Discharge: 2014-08-20 | DRG: 196 | Disposition: A | Discharge status: Home Health | Payer: Medicare Other | Attending: Internal Medicine | Admitting: Internal Medicine

## 2014-08-15 ENCOUNTER — Encounter (HOSPITAL_COMMUNITY): Payer: Self-pay | Admitting: Emergency Medicine

## 2014-08-15 DIAGNOSIS — Z853 Personal history of malignant neoplasm of breast: Secondary | ICD-10-CM

## 2014-08-15 DIAGNOSIS — Z86010 Personal history of colonic polyps: Secondary | ICD-10-CM | POA: Diagnosis not present

## 2014-08-15 DIAGNOSIS — Z881 Allergy status to other antibiotic agents status: Secondary | ICD-10-CM | POA: Diagnosis not present

## 2014-08-15 DIAGNOSIS — E871 Hypo-osmolality and hyponatremia: Secondary | ICD-10-CM | POA: Diagnosis present

## 2014-08-15 DIAGNOSIS — R0902 Hypoxemia: Secondary | ICD-10-CM | POA: Diagnosis not present

## 2014-08-15 DIAGNOSIS — E669 Obesity, unspecified: Secondary | ICD-10-CM | POA: Diagnosis present

## 2014-08-15 DIAGNOSIS — J189 Pneumonia, unspecified organism: Secondary | ICD-10-CM | POA: Diagnosis present

## 2014-08-15 DIAGNOSIS — R0982 Postnasal drip: Secondary | ICD-10-CM | POA: Diagnosis present

## 2014-08-15 DIAGNOSIS — I351 Nonrheumatic aortic (valve) insufficiency: Secondary | ICD-10-CM

## 2014-08-15 DIAGNOSIS — K2970 Gastritis, unspecified, without bleeding: Secondary | ICD-10-CM | POA: Diagnosis present

## 2014-08-15 DIAGNOSIS — Z885 Allergy status to narcotic agent status: Secondary | ICD-10-CM | POA: Diagnosis not present

## 2014-08-15 DIAGNOSIS — I5033 Acute on chronic diastolic (congestive) heart failure: Secondary | ICD-10-CM | POA: Diagnosis present

## 2014-08-15 DIAGNOSIS — J310 Chronic rhinitis: Secondary | ICD-10-CM | POA: Diagnosis present

## 2014-08-15 DIAGNOSIS — M79641 Pain in right hand: Secondary | ICD-10-CM | POA: Diagnosis not present

## 2014-08-15 DIAGNOSIS — J849 Interstitial pulmonary disease, unspecified: Principal | ICD-10-CM | POA: Diagnosis present

## 2014-08-15 DIAGNOSIS — J984 Other disorders of lung: Secondary | ICD-10-CM | POA: Diagnosis present

## 2014-08-15 DIAGNOSIS — I517 Cardiomegaly: Secondary | ICD-10-CM | POA: Diagnosis present

## 2014-08-15 DIAGNOSIS — J84115 Respiratory bronchiolitis interstitial lung disease: Secondary | ICD-10-CM | POA: Diagnosis not present

## 2014-08-15 DIAGNOSIS — K7689 Other specified diseases of liver: Secondary | ICD-10-CM | POA: Diagnosis present

## 2014-08-15 DIAGNOSIS — J8410 Pulmonary fibrosis, unspecified: Secondary | ICD-10-CM | POA: Diagnosis not present

## 2014-08-15 DIAGNOSIS — R059 Cough, unspecified: Secondary | ICD-10-CM

## 2014-08-15 DIAGNOSIS — I2510 Atherosclerotic heart disease of native coronary artery without angina pectoris: Secondary | ICD-10-CM | POA: Diagnosis present

## 2014-08-15 DIAGNOSIS — R0602 Shortness of breath: Secondary | ICD-10-CM | POA: Diagnosis present

## 2014-08-15 DIAGNOSIS — R0600 Dyspnea, unspecified: Secondary | ICD-10-CM | POA: Diagnosis present

## 2014-08-15 DIAGNOSIS — R768 Other specified abnormal immunological findings in serum: Secondary | ICD-10-CM

## 2014-08-15 DIAGNOSIS — R0609 Other forms of dyspnea: Secondary | ICD-10-CM

## 2014-08-15 DIAGNOSIS — I5031 Acute diastolic (congestive) heart failure: Secondary | ICD-10-CM

## 2014-08-15 DIAGNOSIS — J9600 Acute respiratory failure, unspecified whether with hypoxia or hypercapnia: Secondary | ICD-10-CM | POA: Diagnosis not present

## 2014-08-15 DIAGNOSIS — I359 Nonrheumatic aortic valve disorder, unspecified: Secondary | ICD-10-CM | POA: Diagnosis not present

## 2014-08-15 DIAGNOSIS — E785 Hyperlipidemia, unspecified: Secondary | ICD-10-CM | POA: Diagnosis present

## 2014-08-15 DIAGNOSIS — M05842 Other rheumatoid arthritis with rheumatoid factor of left hand: Secondary | ICD-10-CM | POA: Diagnosis not present

## 2014-08-15 DIAGNOSIS — F329 Major depressive disorder, single episode, unspecified: Secondary | ICD-10-CM | POA: Diagnosis present

## 2014-08-15 DIAGNOSIS — Z79899 Other long term (current) drug therapy: Secondary | ICD-10-CM | POA: Diagnosis not present

## 2014-08-15 DIAGNOSIS — M6281 Muscle weakness (generalized): Secondary | ICD-10-CM | POA: Diagnosis not present

## 2014-08-15 DIAGNOSIS — M19041 Primary osteoarthritis, right hand: Secondary | ICD-10-CM | POA: Diagnosis not present

## 2014-08-15 DIAGNOSIS — R05 Cough: Secondary | ICD-10-CM

## 2014-08-15 DIAGNOSIS — Z800 Family history of malignant neoplasm of digestive organs: Secondary | ICD-10-CM

## 2014-08-15 DIAGNOSIS — I509 Heart failure, unspecified: Secondary | ICD-10-CM | POA: Diagnosis not present

## 2014-08-15 DIAGNOSIS — E46 Unspecified protein-calorie malnutrition: Secondary | ICD-10-CM | POA: Diagnosis present

## 2014-08-15 DIAGNOSIS — J9621 Acute and chronic respiratory failure with hypoxia: Secondary | ICD-10-CM | POA: Diagnosis present

## 2014-08-15 DIAGNOSIS — Z888 Allergy status to other drugs, medicaments and biological substances status: Secondary | ICD-10-CM | POA: Diagnosis not present

## 2014-08-15 DIAGNOSIS — K219 Gastro-esophageal reflux disease without esophagitis: Secondary | ICD-10-CM | POA: Diagnosis present

## 2014-08-15 DIAGNOSIS — J84112 Idiopathic pulmonary fibrosis: Secondary | ICD-10-CM | POA: Diagnosis present

## 2014-08-15 DIAGNOSIS — J9601 Acute respiratory failure with hypoxia: Secondary | ICD-10-CM | POA: Diagnosis not present

## 2014-08-15 DIAGNOSIS — T380X5A Adverse effect of glucocorticoids and synthetic analogues, initial encounter: Secondary | ICD-10-CM | POA: Diagnosis present

## 2014-08-15 DIAGNOSIS — M05841 Other rheumatoid arthritis with rheumatoid factor of right hand: Secondary | ICD-10-CM | POA: Diagnosis not present

## 2014-08-15 DIAGNOSIS — R9389 Abnormal findings on diagnostic imaging of other specified body structures: Secondary | ICD-10-CM

## 2014-08-15 DIAGNOSIS — Z8249 Family history of ischemic heart disease and other diseases of the circulatory system: Secondary | ICD-10-CM | POA: Diagnosis not present

## 2014-08-15 DIAGNOSIS — M79642 Pain in left hand: Secondary | ICD-10-CM | POA: Diagnosis not present

## 2014-08-15 DIAGNOSIS — M19042 Primary osteoarthritis, left hand: Secondary | ICD-10-CM | POA: Diagnosis not present

## 2014-08-15 DIAGNOSIS — Z9011 Acquired absence of right breast and nipple: Secondary | ICD-10-CM | POA: Diagnosis present

## 2014-08-15 DIAGNOSIS — I5030 Unspecified diastolic (congestive) heart failure: Secondary | ICD-10-CM | POA: Diagnosis not present

## 2014-08-15 DIAGNOSIS — I10 Essential (primary) hypertension: Secondary | ICD-10-CM | POA: Diagnosis present

## 2014-08-15 DIAGNOSIS — J029 Acute pharyngitis, unspecified: Secondary | ICD-10-CM | POA: Diagnosis present

## 2014-08-15 DIAGNOSIS — R938 Abnormal findings on diagnostic imaging of other specified body structures: Secondary | ICD-10-CM | POA: Diagnosis not present

## 2014-08-15 DIAGNOSIS — E440 Moderate protein-calorie malnutrition: Secondary | ICD-10-CM | POA: Diagnosis not present

## 2014-08-15 DIAGNOSIS — D72829 Elevated white blood cell count, unspecified: Secondary | ICD-10-CM | POA: Diagnosis present

## 2014-08-15 DIAGNOSIS — Z6829 Body mass index (BMI) 29.0-29.9, adult: Secondary | ICD-10-CM | POA: Diagnosis not present

## 2014-08-15 DIAGNOSIS — R7689 Other specified abnormal immunological findings in serum: Secondary | ICD-10-CM

## 2014-08-15 LAB — CBC WITH DIFFERENTIAL/PLATELET
BASOS ABS: 0 10*3/uL (ref 0.0–0.1)
BASOS PCT: 0 % (ref 0–1)
Eosinophils Absolute: 0 10*3/uL (ref 0.0–0.7)
Eosinophils Relative: 0 % (ref 0–5)
HCT: 36.8 % (ref 36.0–46.0)
Hemoglobin: 12.5 g/dL (ref 12.0–15.0)
Lymphocytes Relative: 17 % (ref 12–46)
Lymphs Abs: 1.8 10*3/uL (ref 0.7–4.0)
MCH: 30.3 pg (ref 26.0–34.0)
MCHC: 34 g/dL (ref 30.0–36.0)
MCV: 89.3 fL (ref 78.0–100.0)
Monocytes Absolute: 1.3 10*3/uL — ABNORMAL HIGH (ref 0.1–1.0)
Monocytes Relative: 12 % (ref 3–12)
NEUTROS PCT: 71 % (ref 43–77)
Neutro Abs: 7.4 10*3/uL (ref 1.7–7.7)
PLATELETS: 417 10*3/uL — AB (ref 150–400)
RBC: 4.12 MIL/uL (ref 3.87–5.11)
RDW: 14.3 % (ref 11.5–15.5)
WBC: 10.5 10*3/uL (ref 4.0–10.5)

## 2014-08-15 LAB — COMPREHENSIVE METABOLIC PANEL
ALBUMIN: 3 g/dL — AB (ref 3.5–5.2)
ALK PHOS: 144 U/L — AB (ref 39–117)
ALT: 47 U/L — ABNORMAL HIGH (ref 0–35)
AST: 51 U/L — ABNORMAL HIGH (ref 0–37)
Anion gap: 14 (ref 5–15)
BUN: 15 mg/dL (ref 6–23)
CO2: 22 mEq/L (ref 19–32)
Calcium: 9.9 mg/dL (ref 8.4–10.5)
Chloride: 94 mEq/L — ABNORMAL LOW (ref 96–112)
Creatinine, Ser: 0.72 mg/dL (ref 0.50–1.10)
GFR calc Af Amer: 89 mL/min — ABNORMAL LOW (ref >90)
GFR calc non Af Amer: 77 mL/min — ABNORMAL LOW (ref >90)
Glucose, Bld: 115 mg/dL — ABNORMAL HIGH (ref 70–99)
POTASSIUM: 4.5 meq/L (ref 3.7–5.3)
Sodium: 130 mEq/L — ABNORMAL LOW (ref 137–147)
Total Bilirubin: 0.6 mg/dL (ref 0.3–1.2)
Total Protein: 7 g/dL (ref 6.0–8.3)

## 2014-08-15 LAB — TROPONIN I
Troponin I: <0.3 ng/mL (ref <0.30)
Troponin I: <0.3 ng/mL (ref <0.30)

## 2014-08-15 LAB — PRO B NATRIURETIC PEPTIDE: Pro B Natriuretic peptide (BNP): 1859 pg/mL — ABNORMAL HIGH (ref 0–450)

## 2014-08-15 MED ORDER — LOSARTAN POTASSIUM 50 MG PO TABS
50.0000 mg | ORAL_TABLET | Freq: Every day | ORAL | Status: DC
  Administered 2014-08-15 – 2014-08-17 (×3): 50 mg via ORAL
  Filled 2014-08-15 (×4): qty 1

## 2014-08-15 MED ORDER — METHYLPREDNISOLONE SODIUM SUCC 125 MG IJ SOLR
80.0000 mg | Freq: Once | INTRAMUSCULAR | Status: AC
  Administered 2014-08-15: 80 mg via INTRAVENOUS
  Filled 2014-08-15: qty 2

## 2014-08-15 MED ORDER — IOHEXOL 350 MG/ML SOLN
100.0000 mL | Freq: Once | INTRAVENOUS | Status: AC | PRN
  Administered 2014-08-15: 100 mL via INTRAVENOUS

## 2014-08-15 MED ORDER — TIOTROPIUM BROMIDE MONOHYDRATE 18 MCG IN CAPS
18.0000 ug | ORAL_CAPSULE | Freq: Every day | RESPIRATORY_TRACT | Status: DC
  Administered 2014-08-16: 18 ug via RESPIRATORY_TRACT
  Filled 2014-08-15: qty 5

## 2014-08-15 MED ORDER — ACETAMINOPHEN 650 MG RE SUPP
650.0000 mg | Freq: Four times a day (QID) | RECTAL | Status: DC | PRN
  Filled 2014-08-15: qty 1

## 2014-08-15 MED ORDER — DOCUSATE SODIUM 100 MG PO CAPS
100.0000 mg | ORAL_CAPSULE | Freq: Two times a day (BID) | ORAL | Status: DC
  Administered 2014-08-15 – 2014-08-20 (×10): 100 mg via ORAL
  Filled 2014-08-15 (×11): qty 1

## 2014-08-15 MED ORDER — SODIUM CHLORIDE 0.9 % IJ SOLN
3.0000 mL | Freq: Two times a day (BID) | INTRAMUSCULAR | Status: DC
  Administered 2014-08-15 – 2014-08-20 (×8): 3 mL via INTRAVENOUS

## 2014-08-15 MED ORDER — TRAMADOL HCL 50 MG PO TABS: 50.0000 mg | ORAL_TABLET | Freq: Four times a day (QID) | ORAL | Status: DC | PRN

## 2014-08-15 MED ORDER — PREDNISONE 20 MG PO TABS
40.0000 mg | ORAL_TABLET | Freq: Every day | ORAL | Status: DC
  Administered 2014-08-16: 40 mg via ORAL
  Filled 2014-08-15 (×2): qty 2

## 2014-08-15 MED ORDER — SERTRALINE HCL 100 MG PO TABS
100.0000 mg | ORAL_TABLET | Freq: Every day | ORAL | Status: DC
  Administered 2014-08-15 – 2014-08-20 (×6): 100 mg via ORAL
  Filled 2014-08-15 (×6): qty 1

## 2014-08-15 MED ORDER — ONDANSETRON HCL 4 MG PO TABS: 4.0000 mg | ORAL_TABLET | Freq: Four times a day (QID) | ORAL | Status: DC | PRN

## 2014-08-15 MED ORDER — POLYETHYLENE GLYCOL 3350 17 G PO PACK
17.0000 g | PACK | Freq: Every day | ORAL | Status: DC | PRN
  Filled 2014-08-15: qty 1

## 2014-08-15 MED ORDER — FLUTICASONE PROPIONATE 50 MCG/ACT NA SUSP: 2.0000 | Freq: Once | NASAL | Status: AC | PRN

## 2014-08-15 MED ORDER — ALUM & MAG HYDROXIDE-SIMETH 200-200-20 MG/5ML PO SUSP: 30.0000 mL | Freq: Four times a day (QID) | ORAL | Status: DC | PRN

## 2014-08-15 MED ORDER — ALBUTEROL SULFATE (2.5 MG/3ML) 0.083% IN NEBU
2.5000 mg | INHALATION_SOLUTION | RESPIRATORY_TRACT | Status: DC | PRN
Start: 2014-08-15 — End: 2014-08-20

## 2014-08-15 MED ORDER — GUAIFENESIN ER 600 MG PO TB12
600.0000 mg | ORAL_TABLET | Freq: Two times a day (BID) | ORAL | Status: DC
  Administered 2014-08-15 – 2014-08-20 (×10): 600 mg via ORAL
  Filled 2014-08-15 (×11): qty 1

## 2014-08-15 MED ORDER — ENOXAPARIN SODIUM 40 MG/0.4ML ~~LOC~~ SOLN
40.0000 mg | SUBCUTANEOUS | Status: DC
  Administered 2014-08-15 – 2014-08-19 (×5): 40 mg via SUBCUTANEOUS
  Filled 2014-08-15 (×6): qty 0.4

## 2014-08-15 MED ORDER — FUROSEMIDE 40 MG PO TABS
40.0000 mg | ORAL_TABLET | Freq: Every day | ORAL | Status: DC
  Administered 2014-08-16 – 2014-08-20 (×5): 40 mg via ORAL
  Filled 2014-08-15 (×5): qty 1

## 2014-08-15 MED ORDER — AZITHROMYCIN 500 MG IV SOLR
500.0000 mg | Freq: Once | INTRAVENOUS | Status: AC
  Administered 2014-08-15: 500 mg via INTRAVENOUS
  Filled 2014-08-15: qty 500

## 2014-08-15 MED ORDER — FUROSEMIDE 10 MG/ML IJ SOLN
40.0000 mg | Freq: Once | INTRAMUSCULAR | Status: AC
  Administered 2014-08-15: 40 mg via INTRAVENOUS
  Filled 2014-08-15: qty 4

## 2014-08-15 MED ORDER — ACETAMINOPHEN 325 MG PO TABS: 650.0000 mg | ORAL_TABLET | Freq: Four times a day (QID) | ORAL | Status: DC | PRN

## 2014-08-15 MED ORDER — FUROSEMIDE 40 MG PO TABS
40.0000 mg | ORAL_TABLET | Freq: Every day | ORAL | Status: DC
  Filled 2014-08-15: qty 1

## 2014-08-15 MED ORDER — DEXTROSE 5 % IV SOLN
1.0000 g | Freq: Once | INTRAVENOUS | Status: AC
  Administered 2014-08-15: 1 g via INTRAVENOUS

## 2014-08-15 MED ORDER — PANTOPRAZOLE SODIUM 40 MG PO TBEC
40.0000 mg | DELAYED_RELEASE_TABLET | Freq: Every day | ORAL | Status: DC
  Administered 2014-08-16: 40 mg via ORAL
  Filled 2014-08-15: qty 1

## 2014-08-15 MED ORDER — ASPIRIN EC 325 MG PO TBEC
325.0000 mg | DELAYED_RELEASE_TABLET | Freq: Every day | ORAL | Status: DC
  Administered 2014-08-15 – 2014-08-20 (×6): 325 mg via ORAL
  Filled 2014-08-15 (×6): qty 1

## 2014-08-15 MED ORDER — ONDANSETRON HCL 4 MG/2ML IJ SOLN
4.0000 mg | Freq: Four times a day (QID) | INTRAMUSCULAR | Status: DC | PRN
Start: 2014-08-15 — End: 2014-08-20

## 2014-08-15 MED ORDER — ZOLPIDEM TARTRATE 5 MG PO TABS: 5.0000 mg | ORAL_TABLET | Freq: Every evening | ORAL | Status: DC | PRN

## 2014-08-15 NOTE — ED Notes (Signed)
Pt ambulated to bathroom without O2, her sats dropped to 70%. Pt is 100% on 3L O2. Dr Wilson Singer made aware.

## 2014-08-15 NOTE — H&P (Signed)
PCP:   Marton Redwood, MD   Chief Complaint:  Shortness of Breath  HPI: [name] is an 80 year old female with a history of interstitial lung disease not requiring oxygen, who came to the ER this evening with worsening of her chronic complaints.  Has chronic cough, but started developing dyspnea on exertion.  She has a myriad of other complaints including chronic edema and GI issues which have been addressed in the past month.  She has a pending OV with [facility], but it is several weeks away.  Review of Systems:  Review of Systems - General ROS: positive for  - fatigue and malaise Psychological ROS: negative Ophthalmic ROS: negative ENT ROS: negative Allergy and Immunology ROS: negative Hematological and Lymphatic ROS: negative Endocrine ROS: negative Respiratory ROS: positive for - cough, shortness of breath and wheezing Cardiovascular ROS: negative for - chest pain Gastrointestinal ROS: positive for - appetite loss, constipation, diarrhea and heartburn Genito-Urinary ROS: no dysuria, trouble voiding, or hematuria Musculoskeletal ROS: positive for - joint pain and joint stiffness Neurological ROS: no TIA or stroke symptoms Past Medical History: Past Medical History  Diagnosis Date  . Dyspnea   . Diverticulosis 2008  . Internal hemorrhoids   . Esophageal stricture   . Arthritis   . Breast cancer 1969  . GERD (gastroesophageal reflux disease)   . Gallstones   . Depression   . HLD (hyperlipidemia)   . HTN (hypertension)   . Obesity   . Colon polyp 2013    TUBULAR ADENOMA (X1   Past Surgical History  Procedure Laterality Date  . Abdominal hysterectomy    . Cholecystectomy    . Mastectomy      right  . Appendectomy    . Foot surgery    . Breast surgery      Medications: Prior to Admission medications   Medication Sig Start Date End Date Taking? Authorizing Provider  acetaminophen (TYLENOL 8 HOUR) 650 MG CR tablet Take 1,300 mg by mouth 2 (two) times  daily.   Yes Historical Provider, MD  esomeprazole (NEXIUM) 20 MG capsule Take 20 mg by mouth daily at 12 noon.   Yes Historical Provider, MD  fluticasone (FLONASE) 50 MCG/ACT nasal spray Place 2 sprays into both nostrils once as needed for allergies or rhinitis.   Yes Historical Provider, MD  losartan (COZAAR) 50 MG tablet Take 50 mg by mouth daily.  07/15/14  Yes Historical Provider, MD  sertraline (ZOLOFT) 100 MG tablet Take 100 mg by mouth daily.    Yes Historical Provider, MD  traMADol (ULTRAM) 50 MG tablet Take 50 mg by mouth every 6 (six) hours as needed for moderate pain.    Yes Historical Provider, MD    Allergies:   Allergies  Allergen Reactions  . Antihistamines, Loratadine-Type   . Codeine   . Darvocet [Propoxyphene N-Acetaminophen]   . Moxifloxacin     REACTION: nightmares and anxiety    Social History:  reports that she has never smoked. She has never used smokeless tobacco. She reports that she drinks about 4.2 ounces of alcohol per week. She reports that she does not use illicit drugs.  Family History: Family History  Problem Relation Age of Onset  . Heart disease Mother   . Cancer Mother     bladder  . Esophageal cancer Brother   . Prostate cancer Brother   . Heart disease Brother   . Cancer Brother     sarcoma  . Stomach cancer Neg  Hx     Physical Exam: Filed Vitals:   08/15/14 1458 08/15/14 1459 08/15/14 1745 08/15/14 1818  BP:   165/60 156/59  Pulse:   100 93  Temp:    98.5 F (36.9 C)  TempSrc:    Oral  Resp:   20 25  SpO2: 86% 98% 98% 100%   General appearance: alert, cooperative and appears stated age Head: Normocephalic, without obvious abnormality, atraumatic Eyes: conjunctivae/corneas clear. PERRL, EOM's intact.  Nose: Nares normal. Septum midline. Mucosa normal. No drainage or sinus tenderness. Throat: lips, mucosa, and tongue normal; teeth and gums normal Neck: no adenopathy, no carotid bruit, no JVD and thyroid not enlarged, symmetric, no  tenderness/mass/nodules Resp: diminished breath sounds bibasilar and wheezes bibasilar Cardio: regular rate and rhythm, S1, S2 normal, no murmur, click, rub or gallop GI: soft, non-tender; bowel sounds normal; no masses,  no organomegaly Extremities: 1+ edema bilaterally Pulses: 2+ and symmetric Lymph nodes: Cervical adenopathy: no cervical lymphadenopathy Neurologic: Alert and oriented X 3, normal strength and tone. Normal symmetric reflexes.   Labs on Admission:   Recent Labs  08/15/14 1456  NA 130*  K 4.5  CL 94*  CO2 22  GLUCOSE 115*  BUN 15  CREATININE 0.72  CALCIUM 9.9    Recent Labs  08/15/14 1456  AST 51*  ALT 47*  ALKPHOS 144*  BILITOT 0.6  PROT 7.0  ALBUMIN 3.0*    Recent Labs  08/15/14 1456  WBC 10.5  NEUTROABS 7.4  HGB 12.5  HCT 36.8  MCV 89.3  PLT 417*    Recent Labs  08/15/14 1456  TROPONINI <0.30    Radiological Exams on Admission: Dg Chest 2 View  08/15/2014   CLINICAL DATA:  Shortness of breath and cough.  EXAM: CHEST - 2 VIEW  COMPARISON:  No prior chest x-rays. Comparison is made to CT appearance of the lung bases on prior CT studies from 07/30/2014 and 02/18/2012.  FINDINGS: There is a component of diffuse bilateral chronic lung disease which is fibrotic in nature. Lung bases on prior abdomen CT shows similar changes. There may be component of acute infiltrate, especially in the right upper lung. No pulmonary edema, pleural effusions or pneumothorax identified. The anterior right hemidiaphragm is mildly elevated. The heart size and mediastinal contours are within normal limits. The bony thorax shows degenerative disease of the thoracic spine with associated mild rightward convex scoliosis.  IMPRESSION: Diffuse chronic lung disease which most likely represents pulmonary fibrosis. Without prior chest x-rays, it it would be difficult to exclude acute infiltrate, especially in the right upper lobe.   Electronically Signed   By: Aletta Edouard  M.D.   On: 08/15/2014 15:11   Ct Angio Chest W/cm &/or Wo Cm  08/15/2014   CLINICAL DATA:  Chronic cough. Shortness of breath aunt exertion. Abnormal chest x-ray earlier same date. Prior right mastectomy for breast cancer.  EXAM: CT ANGIOGRAPHY CHEST WITH CONTRAST  TECHNIQUE: Multidetector CT imaging of the chest was performed using the standard protocol during bolus administration of intravenous contrast. Multiplanar CT image reconstructions and MIPs were obtained to evaluate the vascular anatomy.  CONTRAST:  145m OMNIPAQUE IOHEXOL 350 MG/ML IV.  COMPARISON:  No prior CT.  Two-view chest x-ray earlier same date.  FINDINGS: Contrast opacification of the pulmonary arteries is very good. No filling defects within either main pulmonary artery or their branches in either lung to suggest pulmonary embolism. Heart enlarged. Mitral annular calcification. Moderate 3 vessel coronary atherosclerosis. No pericardial effusion.  Moderate to severe atherosclerosis involving the thoracic and upper abdominal aorta without aneurysm or dissection.  Severe interstitial lung disease and associated ground-glass opacity throughout both lungs, with involvement of both upper lobes and both lower lobes. Associated bronchiectasis in the right upper lobe. Biapical pleuroparenchymal scarring, right greater than left. No pulmonary parenchymal nodules or masses. No pleural effusions. Central airways patent with calcified tracheobronchial cartilages.  Normal size lymph nodes in the me mediastinum and in both hila. No significant lymphadenopathy. Prior right mastectomy. Thyroid gland unremarkable.  Large simple cyst in the anterior segment right lobe of liver beneath the dome, maximum measurements approximating 7.1 x 8.3 x 6.8 cm. Visualized upper abdomen otherwise unremarkable. Bone window images demonstrate diffuse thoracic spondylosis and slight exaggeration of the usual thoracic kyphosis.  Review of the MIP images confirms the above  findings.  IMPRESSION: 1. No evidence of pulmonary embolism. 2. Cardiomegaly.  Moderate 3 vessel coronary atherosclerosis. 3. Severe interstitial lung disease involving both upper lobes and both lower lobes, with associated ground-glass opacity. Given the history of chronic cough, I suspect this represents the acute phase of usual interstitial pneumonitis (UIP)/pulmonary fibrosis. 4. Approximate 8 cm simple cyst in the visualized liver.   Electronically Signed   By: Evangeline Dakin M.D.   On: 08/15/2014 17:43   Ct Abdomen Pelvis W Contrast  07/30/2014   CLINICAL DATA:  Generalized abdomen pain with nausea for 5 days.  EXAM: CT ABDOMEN AND PELVIS WITH CONTRAST  TECHNIQUE: Multidetector CT imaging of the abdomen and pelvis was performed using the standard protocol following bolus administration of intravenous contrast.  CONTRAST:  169mL OMNIPAQUE IOHEXOL 300 MG/ML  SOLN  COMPARISON:  February 18, 2012  FINDINGS: There is a a 8.2 cm liver cyst unchanged compared to prior exam. The liver is otherwise normal. The patient is status post prior cholecystectomy. Spleen, pancreas, adrenal glands and kidneys are normal. There is no hydronephrosis bilaterally. There is atherosclerosis of the abdominal aorta without aneurysmal dilatation. There is no abdominal lymphadenopathy. There is no small bowel obstruction. There is diverticulosis of colon without diverticulitis. The patient is status post prior appendectomy.  Fluid-filled bladder is normal. The patient is status post hysterectomy. Degenerative joint changes of the spine are noted. There is peripheral scar/fibrosis of the lung bases.  IMPRESSION: No acute abnormality identified in the abdomen and pelvis. No evidence of bowel obstruction or diverticulitis.   Electronically Signed   By: Abelardo Diesel M.D.   On: 07/30/2014 19:52   Orders placed during the hospital encounter of 08/15/14  . EKG 12-LEAD  . EKG 12-LEAD    Assessment/Plan Active Problems:    Dyspnea Likely multifactorial, including preexisting interstitial lung disease as well as possible worsening CHF as her prior outpatient BNP was 133 and currently in the 1000 range with SOB.  Will repeat her Echo and compare to last month.  Per notes, there was a pending referral to [facility] which has not yet happened.  Will also cycle cardiac enzymes and get their input once repeat echo obtained.  Was given Rocephin/ZIthromax in ER, will continue.  Also received IV SoluMedrol in ER, which will be changed to Prednisone $RemoveBefor'40mg'[id number]$ .  The CT was read as new, acute phase ILD, but she in fact has a preexisting dx.  Oxygen, nebs as needed. CHF:  See above, Lasix $RemoveBefo'40mg'[id number]$  added, already on ARB.  WiIl not push further as her SBP in 108 at this point, reassess in AM Ruled out PE on CT GERD with known  stricture per Deatra Ina last month, continue PPI HTN: See above. Liver cyst, Large but simple, not related to acute issues, but will need outpatient assessment.  Will repeat her LFT's in AM as elevated. Protein Calorie Malnutrition  Albumin 3.0.  Nutrition consult. H/O Breast CA:  No masses seen on chest CT. Hyponatremia, mild:  Will continue to follow, may be related to CHF as well. TISOVEC,RICHARD W 08/15/2014, 7:12 PM

## 2014-08-15 NOTE — Progress Notes (Signed)
RT attempted X2 to obtain arterial blood sample for blood gas analysis. Patient conveys that it is a painful experience and refuses to allow another RT to attempt tonight. However, she has agreed to allow someone to try again in the morning. At this time, she understands the reasoning and importance of the test. Education provided, but she continues to decline reattempts at this time. RN aware.

## 2014-08-15 NOTE — ED Notes (Addendum)
She c/o generalized abd. Discomfort and cough "for quite some time now".  Here today with these persistent complaints, plus recently had developed exertional dyspnea.  She is in nsr without ectopy and is in no distress.  Her skin is pale, warm and dry and she is not short of breath while at rest.  EKG performed at triage.

## 2014-08-15 NOTE — ED Provider Notes (Signed)
CSN: [id number]     Arrival date & time 08/15/14  68 History   First MD Initiated Contact with Patient 08/15/14 1358     Chief Complaint  Patient presents with  . Shortness of Breath     (Consider location/radiation/quality/duration/timing/severity/associated sxs/prior Treatment) HPI Patient reports she has been having shortness of breath for the past few weeks. She has a cough especially in the morning with a lot of clear sputum production. She describes dyspnea on exertion and feels better when she lays down. She denies any fever. Chest mild sore throat when she coughs. She denies rhinorrhea and has mild nausea without vomiting. She has lost appetite and states she alternates between loose diarrhea and constipation. Most recently she had loose stools. She states she was seen in the ED last week and for abdominal pain and had a CT scan done. She had endoscopy done one week ago which showed gastritis and a polyp in her stomach. She states sometimes it she feels like she is wheezing. She complains of a lot of sinus congestion. She states her feet were swelling 6 weeks ago when her Dr. stopped her Celebrex that she been on for 30 years. She denies chest pain or chest tightness. She denies any prior history of respiratory diagnosis. Patient states she had an outpatient echo that was abnormal and she's been referred to cardiology. Family states they think she has congestive heart.  PCP Dr Brigitte Pulse Cardiology Dr Stanford Breed has an appt on the 28th (states she can't wait that long to be seen).   Past Medical History  Diagnosis Date  . Dyspnea   . Diverticulosis 2008  . Internal hemorrhoids   . Esophageal stricture   . Arthritis   . Breast cancer 1969  . GERD (gastroesophageal reflux disease)   . Gallstones   . Depression   . HLD (hyperlipidemia)   . HTN (hypertension)   . Obesity   . Colon polyp 2013    TUBULAR ADENOMA (X1   Past Surgical History  Procedure Laterality Date  . Abdominal  hysterectomy    . Cholecystectomy    . Mastectomy      right  . Appendectomy    . Foot surgery    . Breast surgery     Family History  Problem Relation Age of Onset  . Heart disease Mother   . Cancer Mother     bladder  . Esophageal cancer Brother   . Prostate cancer Brother   . Heart disease Brother   . Cancer Brother     sarcoma  . Stomach cancer Neg Hx    History  Substance Use Topics  . Smoking status: Never Smoker   . Smokeless tobacco: Never Used  . Alcohol Use: 4.2 oz/week    7 Glasses of wine per week     Comment: ocaasional   Lives at home Lives alone No second hand smoke  OB History   Grav Para Term Preterm Abortions TAB SAB Ect Mult Living                 Review of Systems  All other systems reviewed and are negative.     Allergies  Antihistamines, loratadine-type; Codeine; Darvocet; and Moxifloxacin  Home Medications   Prior to Admission medications   Medication Sig Start Date End Date Taking? Authorizing Provider  acetaminophen (TYLENOL 8 HOUR) 650 MG CR tablet Take 1,300 mg by mouth 2 (two) times daily.   Yes Historical Provider, MD  esomeprazole ([place]) 20  MG capsule Take 20 mg by mouth daily at 12 noon.   Yes Historical Provider, MD  fluticasone (FLONASE) 50 MCG/ACT nasal spray Place 2 sprays into both nostrils once as needed for allergies or rhinitis.   Yes Historical Provider, MD  losartan (COZAAR) 50 MG tablet Take 50 mg by mouth daily.  07/15/14  Yes Historical Provider, MD  sertraline (ZOLOFT) 100 MG tablet Take 100 mg by mouth daily.    Yes Historical Provider, MD  traMADol (ULTRAM) 50 MG tablet Take 50 mg by mouth every 6 (six) hours as needed for moderate pain.    Yes Historical Provider, MD   BP 154/59  Pulse 103  Temp(Src) 98 F (36.7 C) (Oral)  Resp 28  SpO2 88%  Vital signs normal except tachycardia  Physical Exam  Nursing note and vitals reviewed. Constitutional: She is oriented to person, place, and time. She appears  well-developed and well-nourished.  Non-toxic appearance. She does not appear ill. No distress.  HENT:  Head: Normocephalic and atraumatic.  Right Ear: External ear normal.  Left Ear: External ear normal.  Nose: Nose normal. No mucosal edema or rhinorrhea.  Mouth/Throat: Oropharynx is clear and moist and mucous membranes are normal. No dental abscesses or uvula swelling.  Eyes: Conjunctivae and EOM are normal. Pupils are equal, round, and reactive to light.  Neck: Normal range of motion and full passive range of motion without pain. Neck supple.  Cardiovascular: Normal rate, regular rhythm and normal heart sounds.  Exam reveals no gallop and no friction rub.   No murmur heard. Pulmonary/Chest: Effort normal and breath sounds normal. No respiratory distress. She has no wheezes. She has no rhonchi. She has no rales. She exhibits no tenderness and no crepitus.  Pt has some crackles in her bases, but patient is laying flat in bed and is in no distress  Abdominal: Soft. Normal appearance and bowel sounds are normal. She exhibits no distension. There is no tenderness. There is no rebound and no guarding.  Musculoskeletal: Normal range of motion. She exhibits no edema and no tenderness.  Moves all extremities well.   Neurological: She is alert and oriented to person, place, and time. She has normal strength. No cranial nerve deficit.  Skin: Skin is warm, dry and intact. No rash noted. No erythema. No pallor.  Psychiatric: She has a normal mood and affect. Her speech is normal and behavior is normal. Her mood appears not anxious.    ED Course  Procedures (including critical care time)  Medications  cefTRIAXone (ROCEPHIN) 1 g in dextrose 5 % 50 mL IVPB (not administered)  azithromycin (ZITHROMAX) 500 mg in dextrose 5 % 250 mL IVPB (not administered)    Patient was ambulated by nursing staff and I saw in the chart where her pulse ox dropped to 86%. I was not verbally told that. Patient was placed  on oxygen. Patient states she's never had lung problems in the past and has never had to be on oxygen. She states everybody in her family has some type of fungal lung problems but does not sound as extensive as what her x-ray looks like today. She states both her parents smoked heavily and her husband used to smoke.  Patient was started on community acquired pneumonia antibiotics. CT scan of her chest was ordered.   Dr Wilson Singer will talk to hospitalist and f/u on CT of her chest.     ------------------------------------------------------------------- Transthoracic Echocardiography ------------------------------------------------------------------- Study Conclusions  - Left ventricle: LVEF is 55 to  60% with hyppokinesis of the inferior wall (base, mid). The cavity size was normal. Wall thickness was normal. Systolic function was normal. The estimated ejection fraction was in the range of 55% to 60%. Doppler parameters are consistent with abnormal left ventricular relaxation (grade 1 diastolic dysfunction). - Aortic valve: There was moderate regurgitation.  Dorris Carnes, M.D. 2015-09-14T16:[id number]:30    Labs Review  Results for orders placed during the hospital encounter of 08/15/14  CBC WITH DIFFERENTIAL      Result Value Ref Range   WBC 10.5  4.0 - 10.5 K/uL   RBC 4.12  3.87 - 5.11 MIL/uL   Hemoglobin 12.5  12.0 - 15.0 g/dL   HCT 36.8  36.0 - 46.0 %   MCV 89.3  78.0 - 100.0 fL   MCH 30.3  26.0 - 34.0 pg   MCHC 34.0  30.0 - 36.0 g/dL   RDW 14.3  11.5 - 15.5 %   Platelets 417 (*) 150 - 400 K/uL   Neutrophils Relative % 71  43 - 77 %   Neutro Abs 7.4  1.7 - 7.7 K/uL   Lymphocytes Relative 17  12 - 46 %   Lymphs Abs 1.8  0.7 - 4.0 K/uL   Monocytes Relative 12  3 - 12 %   Monocytes Absolute 1.3 (*) 0.1 - 1.0 K/uL   Eosinophils Relative 0  0 - 5 %   Eosinophils Absolute 0.0  0.0 - 0.7 K/uL   Basophils Relative 0  0 - 1 %   Basophils Absolute 0.0  0.0 - 0.1 K/uL  COMPREHENSIVE  METABOLIC PANEL      Result Value Ref Range   Sodium 130 (*) 137 - 147 mEq/L   Potassium 4.5  3.7 - 5.3 mEq/L   Chloride 94 (*) 96 - 112 mEq/L   CO2 22  19 - 32 mEq/L   Glucose, Bld 115 (*) 70 - 99 mg/dL   BUN 15  6 - 23 mg/dL   Creatinine, Ser 0.72  0.50 - 1.10 mg/dL   Calcium 9.9  8.4 - 10.5 mg/dL   Total Protein 7.0  6.0 - 8.3 g/dL   Albumin 3.0 (*) 3.5 - 5.2 g/dL   AST 51 (*) 0 - 37 U/L   ALT 47 (*) 0 - 35 U/L   Alkaline Phosphatase 144 (*) 39 - 117 U/L   Total Bilirubin 0.6  0.3 - 1.2 mg/dL   GFR calc non Af Amer 77 (*) >90 mL/min   GFR calc Af Amer 89 (*) >90 mL/min   Anion gap 14  5 - 15  PRO B NATRIURETIC PEPTIDE      Result Value Ref Range   Pro B Natriuretic peptide (BNP) 1859.0 (*) 0 - 450 pg/mL  TROPONIN I      Result Value Ref Range   Troponin I <0.30  <0.30 ng/mL    Laboratory interpretation all normal except elevated BNP, hyponatremia   Imaging Review Dg Chest 2 View  08/15/2014   CLINICAL DATA:  Shortness of breath and cough.  EXAM: CHEST - 2 VIEW  COMPARISON:  No prior chest x-rays. Comparison is made to CT appearance of the lung bases on prior CT studies from 07/30/2014 and 02/18/2012.  FINDINGS: There is a component of diffuse bilateral chronic lung disease which is fibrotic in nature. Lung bases on prior abdomen CT shows similar changes. There may be component of acute infiltrate, especially in the right upper lung. No pulmonary edema, pleural effusions or pneumothorax  identified. The anterior right hemidiaphragm is mildly elevated. The heart size and mediastinal contours are within normal limits. The bony thorax shows degenerative disease of the thoracic spine with associated mild rightward convex scoliosis.  IMPRESSION: Diffuse chronic lung disease which most likely represents pulmonary fibrosis. Without prior chest x-rays, it it would be difficult to exclude acute infiltrate, especially in the right upper lobe.   Electronically Signed   By: Aletta Edouard M.D.    On: 08/15/2014 15:11    Ct Abdomen Pelvis W Contrast  07/30/2014   CLINICAL DATA:  Generalized abdomen pain with nausea for 5 days.  EXAM: CT ABDOMEN AND PELVIS WITH CONTRAST  TECHNIQUE: Multidetector CT imaging of the abdomen and pelvis was performed using the standard protocol following bolus administration of intravenous contrast.  CONTRAST:  126mL OMNIPAQUE IOHEXOL 300 MG/ML  SOLN  COMPARISON:  February 18, 2012  FINDINGS: There is a a 8.2 cm liver cyst unchanged compared to prior exam. The liver is otherwise normal. The patient is status post prior cholecystectomy. Spleen, pancreas, adrenal glands and kidneys are normal. There is no hydronephrosis bilaterally. There is atherosclerosis of the abdominal aorta without aneurysmal dilatation. There is no abdominal lymphadenopathy. There is no small bowel obstruction. There is diverticulosis of colon without diverticulitis. The patient is status post prior appendectomy.  Fluid-filled bladder is normal. The patient is status post hysterectomy. Degenerative joint changes of the spine are noted. There is peripheral scar/fibrosis of the lung bases.  IMPRESSION: No acute abnormality identified in the abdomen and pelvis. No evidence of bowel obstruction or diverticulitis.   Electronically Signed   By: Abelardo Diesel M.D.   On: 07/30/2014 19:52    EKG Interpretation   Date/Time:  Saturday August 15 2014 13:34:17 EDT Ventricular Rate:  96 PR Interval:  179 QRS Duration: 104 QT Interval:  359 QTC Calculation: 454 R Axis:   89 Text Interpretation:  Sinus rhythm Probable left atrial enlargement  Anterior infarct, old T wave inversion no longer evident in Inferior leads  18 Feb 2012 Confirmed by Zada Haser  MD-I, Mabelle Mungin ([id number]) on 08/15/2014 2:15:54 PM      MDM   Final diagnoses:  Hypoxia  DOE (dyspnea on exertion)  Abnormal chest x-ray  Aortic regurgitation    Plan admission   Rolland Porter, MD, Abram Sander    Janice Norrie, MD 08/15/14 [id number]

## 2014-08-16 ENCOUNTER — Inpatient Hospital Stay (HOSPITAL_COMMUNITY): Payer: Medicare Other

## 2014-08-16 ENCOUNTER — Encounter (HOSPITAL_COMMUNITY): Payer: Self-pay | Admitting: *Deleted

## 2014-08-16 DIAGNOSIS — J9601 Acute respiratory failure with hypoxia: Secondary | ICD-10-CM

## 2014-08-16 DIAGNOSIS — R0602 Shortness of breath: Secondary | ICD-10-CM

## 2014-08-16 DIAGNOSIS — J8410 Pulmonary fibrosis, unspecified: Secondary | ICD-10-CM | POA: Diagnosis present

## 2014-08-16 DIAGNOSIS — I351 Nonrheumatic aortic (valve) insufficiency: Secondary | ICD-10-CM

## 2014-08-16 DIAGNOSIS — R938 Abnormal findings on diagnostic imaging of other specified body structures: Secondary | ICD-10-CM

## 2014-08-16 DIAGNOSIS — I359 Nonrheumatic aortic valve disorder, unspecified: Secondary | ICD-10-CM

## 2014-08-16 LAB — COMPREHENSIVE METABOLIC PANEL
ALT: 38 U/L — ABNORMAL HIGH (ref 0–35)
AST: 32 U/L (ref 0–37)
Albumin: 2.8 g/dL — ABNORMAL LOW (ref 3.5–5.2)
Alkaline Phosphatase: 130 U/L — ABNORMAL HIGH (ref 39–117)
Anion gap: 13 (ref 5–15)
BILIRUBIN TOTAL: 0.6 mg/dL (ref 0.3–1.2)
BUN: 11 mg/dL (ref 6–23)
CO2: 24 meq/L (ref 19–32)
CREATININE: 0.6 mg/dL (ref 0.50–1.10)
Calcium: 9.9 mg/dL (ref 8.4–10.5)
Chloride: 97 mEq/L (ref 96–112)
GFR, EST NON AFRICAN AMERICAN: 81 mL/min — AB (ref >90)
Glucose, Bld: 161 mg/dL — ABNORMAL HIGH (ref 70–99)
Potassium: 4.2 mEq/L (ref 3.7–5.3)
Sodium: 134 mEq/L — ABNORMAL LOW (ref 137–147)
Total Protein: 7 g/dL (ref 6.0–8.3)

## 2014-08-16 LAB — CBC
HEMATOCRIT: 37.6 % (ref 36.0–46.0)
Hemoglobin: 13 g/dL (ref 12.0–15.0)
MCH: 30.3 pg (ref 26.0–34.0)
MCHC: 34.6 g/dL (ref 30.0–36.0)
MCV: 87.6 fL (ref 78.0–100.0)
PLATELETS: 441 10*3/uL — AB (ref 150–400)
RBC: 4.29 MIL/uL (ref 3.87–5.11)
RDW: 14.4 % (ref 11.5–15.5)
WBC: 7.6 10*3/uL (ref 4.0–10.5)

## 2014-08-16 LAB — TSH: TSH: 0.589 u[IU]/mL (ref 0.350–4.500)

## 2014-08-16 LAB — TROPONIN I: Troponin I: <0.3 ng/mL (ref <0.30)

## 2014-08-16 LAB — PRO B NATRIURETIC PEPTIDE: PRO B NATRI PEPTIDE: 1717 pg/mL — AB (ref 0–450)

## 2014-08-16 MED ORDER — DEXTROSE 5 % IV SOLN
500.0000 mg | INTRAVENOUS | Status: DC
  Administered 2014-08-16 – 2014-08-17 (×2): 500 mg via INTRAVENOUS
  Filled 2014-08-16 (×3): qty 500

## 2014-08-16 MED ORDER — PANTOPRAZOLE SODIUM 40 MG PO TBEC
40.0000 mg | DELAYED_RELEASE_TABLET | Freq: Two times a day (BID) | ORAL | Status: DC
  Administered 2014-08-16 – 2014-08-20 (×8): 40 mg via ORAL
  Filled 2014-08-16 (×12): qty 1

## 2014-08-16 MED ORDER — METHYLPREDNISOLONE SODIUM SUCC 125 MG IJ SOLR
80.0000 mg | Freq: Two times a day (BID) | INTRAMUSCULAR | Status: DC
  Administered 2014-08-16 – 2014-08-18 (×5): 80 mg via INTRAVENOUS
  Filled 2014-08-16 (×6): qty 1.28

## 2014-08-16 MED ORDER — DEXTROSE 5 % IV SOLN
1.0000 g | INTRAVENOUS | Status: DC
  Administered 2014-08-16 – 2014-08-19 (×4): 1 g via INTRAVENOUS
  Filled 2014-08-16 (×5): qty 10

## 2014-08-16 NOTE — Consult Note (Signed)
PULMONARY / CRITICAL CARE MEDICINE   Name: [name] MRN: [id number] DOB: July 29, 1928    ADMISSION DATE:  08/15/2014 CONSULTATION DATE:  08/16/14   REFERRING MD : Osborne Casco for Dr Lang Snow  CHIEF COMPLAINT:  sob  INITIAL PRESENTATION:  86 yowf never smoker eval for cough in 2012 with low dlco but nl sats with exercise admit from home with progressive doe/cough x one year assoc with poorly controlled arthritis  with evidence of both chf and new ILD so PCCM asked to see am 08/16/14    STUDIES:  CT 10/10 >1. No evidence of pulmonary embolism.  2. Cardiomegaly. Moderate 3 vessel coronary atherosclerosis.  3. Severe interstitial lung disease involving both upper lobes and  both lower lobes, with associated ground-glass opacity. Given the  history of chronic cough, I suspect this represents the acute phase  of usual interstitial pneumonitis (UIP)/pulmonary fibrosis. ESR 10/11  >>>   SIGNIFICANT EVENTS: Empiric steroids started pm 08/16/14            HISTORY OF PRESENT ILLNESS:    Progressive sob/ cough indolent onset x 1 y pta to point where can't walk across the room s sob assoc with generalized arthritis.  No def dx of collagen vasc dz but has been told her RA is POS per pt. No exp to amiodarone or MTX or neurontin to her knowledge   No obvious day to day or daytime variabilty or assoc  cp or chest tightness, subjective wheeze overt sinus or hb symptoms. No unusual exp hx or h/o childhood pna/ asthma or knowledge of premature birth.  Sleeping ok without nocturnal  or early am exacerbation  of respiratory  c/o's or need for noct saba. Also denies any obvious fluctuation of symptoms with weather or environmental changes or other aggravating or alleviating factors except as outlined above   Current Medications, Allergies, Complete Past Medical History, Past Surgical History, Family History, and Social History were reviewed in [employer] record.  ROS  The  following are not active complaints unless bolded sore throat, dysphagia, dental problems, itching, sneezing,  nasal congestion or excess/ purulent secretions, ear ache,   fever, chills, sweats, unintended wt loss, pleuritic or exertional cp, hemoptysis,  orthopnea pnd or leg swelling, presyncope, palpitations, heartburn, abdominal pain, anorexia, nausea, vomiting, diarrhea  or change in bowel or urinary habits, change in stools or urine, dysuria,hematuria,  rash, arthralgias, visual complaints, headache, numbness weakness or ataxia or problems with walking or coordination,  change in mood/affect or memory.       PAST MEDICAL HISTORY :  Dyspnea  - No desats walking November 28, 2010 x 2 laps, sats improved with activity  - PFT's 03/10/10 FEV1 1.68 (130%) ratio 74 , DLCO 48% (vs 45% 04/22/07) corrects to 60%  Past Surgical History:  Hysterectomy  Cholecystectomy mid 90's  Radial rt mastectomy 1969     has a past medical history of Dyspnea; Diverticulosis (2008); Internal hemorrhoids; Esophageal stricture; Arthritis; Breast cancer (1969); GERD (gastroesophageal reflux disease); Gallstones; Depression; HLD (hyperlipidemia); HTN (hypertension); Obesity; and Colon polyp (2013).  has past surgical history that includes Abdominal hysterectomy; Cholecystectomy; Mastectomy; Appendectomy; Foot surgery; and Breast surgery. Prior to Admission medications   Medication Sig Start Date End Date Taking? Authorizing Provider  acetaminophen (TYLENOL 8 HOUR) 650 MG CR tablet Take 1,300 mg by mouth 2 (two) times daily.   Yes Historical Provider, MD  esomeprazole (NEXIUM) 20 MG capsule Take 20 mg by mouth daily at 12 noon.  Yes Historical Provider, MD  fluticasone (FLONASE) 50 MCG/ACT nasal spray Place 2 sprays into both nostrils once as needed for allergies or rhinitis.   Yes Historical Provider, MD  losartan (COZAAR) 50 MG tablet Take 50 mg by mouth daily.  07/15/14  Yes Historical Provider, MD  sertraline (ZOLOFT)  100 MG tablet Take 100 mg by mouth daily.    Yes Historical Provider, MD  traMADol (ULTRAM) 50 MG tablet Take 50 mg by mouth every 6 (six) hours as needed for moderate pain.    Yes Historical Provider, MD   Allergies  Allergen Reactions  . Antihistamines, Loratadine-Type   . Codeine   . Darvocet [Propoxyphene N-Acetaminophen]   . Moxifloxacin     REACTION: nightmares and anxiety    FAMILY HISTORY:  has no family status information on file.  SOCIAL HISTORY:  reports that she has never smoked. She has never used smokeless tobacco. She reports that she drinks about 4.2 ounces of alcohol per week. She reports that she does not use illicit drugs.     SUBJECTIVE:  nad at rest on 02 2lpm  VITAL SIGNS: Temp:  [97.4 F (36.3 C)-98.5 F (36.9 C)] 97.4 F (36.3 C) (10/11 0532) Pulse Rate:  [81-108] 81 (10/11 0532) Resp:  [20-28] 20 (10/11 0532) BP: (104-198)/(57-74) 158/64 mmHg (10/11 0532) SpO2:  [86 %-100 %] 96 % (10/11 0808) Weight:  [159 lb 2.8 oz (72.2 kg)] 159 lb 2.8 oz (72.2 kg) (10/10 2026) HEMODYNAMICS:   VENTILATOR SETTINGS:   INTAKE / OUTPUT: No intake or output data in the 24 hours ending 08/16/14 1133  PHYSICAL EXAMINATION: Pt alert, approp nad @ 30 degrees hob No jvd Oropharanx clear Neck supple Lungs with bilateral insp crackles  RRR no s3 or or sign murmur Abd obese with excursion  Extr wam with no edema or clubbing noted Neuro  No motor deficits   LABS:  CBC  Recent Labs Lab 08/15/14 1456 08/16/14 0334  WBC 10.5 7.6  HGB 12.5 13.0  HCT 36.8 37.6  PLT 417* 441*   Coag's No results found for this basename: APTT, INR,  in the last 168 hours BMET  Recent Labs Lab 08/15/14 1456 08/16/14 0334  NA 130* 134*  K 4.5 4.2  CL 94* 97  CO2 22 24  BUN 15 11  CREATININE 0.72 0.60  GLUCOSE 115* 161*   Electrolytes  Recent Labs Lab 08/15/14 1456 08/16/14 0334  CALCIUM 9.9 9.9   Sepsis Markers No results found for this basename:  LATICACIDVEN, PROCALCITON, O2SATVEN,  in the last 168 hours ABG No results found for this basename: PHART, PCO2ART, PO2ART,  in the last 168 hours Liver Enzymes  Recent Labs Lab 08/15/14 1456 08/16/14 0334  AST 51* 32  ALT 47* 38*  ALKPHOS 144* 130*  BILITOT 0.6 0.6  ALBUMIN 3.0* 2.8*   Cardiac Enzymes  Recent Labs Lab 08/15/14 1456 08/15/14 1457 08/15/14 2135 08/16/14 0237 08/16/14 0336 08/16/14 0920  TROPONINI <0.30  --  <0.30 <0.30  --  <0.30  PROBNP  --  1859.0*  --   --  1717.0*  --    Glucose No results found for this basename: GLUCAP,  in the last 168 hours      ASSESSMENT / PLAN:  Pulmonary fibrosis ? Etiology in pt with ? Underlying RA or other connective tissue dz Rec:  Check studies then start solumdrol 80 mg IV q 12   Comment: DDx for pulmonary fibrosis  includes idiopathic pulmonary fibrosis, pulmonary fibrosis  associated with rheumatologic diseases (which have a relatively benign course in most cases) , adverse effect from  drugs such as chemotherapy or amiodarone exposure, nonspecific interstitial pneumonia which is typically steroid responsive, and chronic hypersensitivity pneumonitis.   In active  smokers Langerhan's Cell  Histiocyctosis (eosinophilic granuomatosis),  DIP,  and Respiratory Bronchiolitis ILD also need to be considered, but these can be dismissed here  Also note: Use of PPI is associated with improved survival time and with decreased radiologic fibrosis per King's study published in [facility] vol 184 p1390.  Dec 2011  This may not be cause and effect, but given how universally unhelpful all the otherstudy drugs have been for pf,   rec continue max   rx ppi / diet/ lifestyle modification.     2) acute hypoxic respiratory failure secondary to 1) and 3) - Rx 02 to sat > 97%    3)) Diastolic CHF GI with elevated bnp overlapping with PF contributing to interstial changes   - see Echo 08/16/14 > rx per primary team   Christinia Gully,  MD Pulmonary and [place] [phone] After 5:30 PM or weekends, call [phone]

## 2014-08-16 NOTE — Progress Notes (Signed)
Subjective: Stable overnight.  Attempt was made at ABG last night, but the tech hurt her and she refused this AM.  Nurse said it was ok not to do without informing the physician who ordered the test (me).  Echo done this AM as well.  Result pending  Objective: Vital signs in last 24 hours: Temp:  [97.4 F (36.3 C)-98.5 F (36.9 C)] 97.4 F (36.3 C) (10/11 0532) Pulse Rate:  [81-108] 81 (10/11 0532) Resp:  [20-28] 20 (10/11 0532) BP: (104-198)/(57-74) 158/64 mmHg (10/11 0532) SpO2:  [86 %-100 %] 96 % (10/11 0808) Weight:  [72.2 kg (159 lb 2.8 oz)] 72.2 kg (159 lb 2.8 oz) (10/10 2026) Weight change:  Last BM Date: 08/15/14  Intake/Output from previous day:   Intake/Output this shift:   General appearance: alert, cooperative and appears stated age  Head: Normocephalic, without obvious abnormality, atraumatic  Neck: no adenopathy, no carotid bruit, no JVD and thyroid not enlarged, symmetric, no tenderness/mass/nodules  Resp: diminished breath sounds bibasilar and wheezes bibasilar  Cardio: regular rate and rhythm, S1, S2 normal, no murmur, click, rub or gallop  GI: soft, non-tender; bowel sounds normal; no masses, no organomegaly  Extremities: 1+ edema bilaterally  Pulses: 2+ and symmetric  Lymph nodes: Cervical adenopathy: no cervical lymphadenopathy  Neurologic: Alert and oriented X 3, normal strength and tone. Normal symmetric reflexes.    Lab Results:  Recent Labs  08/15/14 1456 08/16/14 0334  WBC 10.5 7.6  HGB 12.5 13.0  HCT 36.8 37.6  PLT 417* 441*   BMET  Recent Labs  08/15/14 1456 08/16/14 0334  NA 130* 134*  K 4.5 4.2  CL 94* 97  CO2 22 24  GLUCOSE 115* 161*  BUN 15 11  CREATININE 0.72 0.60  CALCIUM 9.9 9.9    Studies/Results: Dg Chest 2 View  08/15/2014   CLINICAL DATA:  Shortness of breath and cough.  EXAM: CHEST - 2 VIEW  COMPARISON:  No prior chest x-rays. Comparison is made to CT appearance of the lung bases on prior CT studies from 07/30/2014  and 02/18/2012.  FINDINGS: There is a component of diffuse bilateral chronic lung disease which is fibrotic in nature. Lung bases on prior abdomen CT shows similar changes. There may be component of acute infiltrate, especially in the right upper lung. No pulmonary edema, pleural effusions or pneumothorax identified. The anterior right hemidiaphragm is mildly elevated. The heart size and mediastinal contours are within normal limits. The bony thorax shows degenerative disease of the thoracic spine with associated mild rightward convex scoliosis.  IMPRESSION: Diffuse chronic lung disease which most likely represents pulmonary fibrosis. Without prior chest x-rays, it it would be difficult to exclude acute infiltrate, especially in the right upper lobe.   Electronically Signed   By: Aletta Edouard M.D.   On: 08/15/2014 15:11   Ct Angio Chest W/cm &/or Wo Cm  08/15/2014   CLINICAL DATA:  Chronic cough. Shortness of breath aunt exertion. Abnormal chest x-ray earlier same date. Prior right mastectomy for breast cancer.  EXAM: CT ANGIOGRAPHY CHEST WITH CONTRAST  TECHNIQUE: Multidetector CT imaging of the chest was performed using the standard protocol during bolus administration of intravenous contrast. Multiplanar CT image reconstructions and MIPs were obtained to evaluate the vascular anatomy.  CONTRAST:  120mL OMNIPAQUE IOHEXOL 350 MG/ML IV.  COMPARISON:  No prior CT.  Two-view chest x-ray earlier same date.  FINDINGS: Contrast opacification of the pulmonary arteries is very good. No filling defects within either main pulmonary artery  or their branches in either lung to suggest pulmonary embolism. Heart enlarged. Mitral annular calcification. Moderate 3 vessel coronary atherosclerosis. No pericardial effusion. Moderate to severe atherosclerosis involving the thoracic and upper abdominal aorta without aneurysm or dissection.  Severe interstitial lung disease and associated ground-glass opacity throughout both lungs,  with involvement of both upper lobes and both lower lobes. Associated bronchiectasis in the right upper lobe. Biapical pleuroparenchymal scarring, right greater than left. No pulmonary parenchymal nodules or masses. No pleural effusions. Central airways patent with calcified tracheobronchial cartilages.  Normal size lymph nodes in the me mediastinum and in both hila. No significant lymphadenopathy. Prior right mastectomy. Thyroid gland unremarkable.  Large simple cyst in the anterior segment right lobe of liver beneath the dome, maximum measurements approximating 7.1 x 8.3 x 6.8 cm. Visualized upper abdomen otherwise unremarkable. Bone window images demonstrate diffuse thoracic spondylosis and slight exaggeration of the usual thoracic kyphosis.  Review of the MIP images confirms the above findings.  IMPRESSION: 1. No evidence of pulmonary embolism. 2. Cardiomegaly.  Moderate 3 vessel coronary atherosclerosis. 3. Severe interstitial lung disease involving both upper lobes and both lower lobes, with associated ground-glass opacity. Given the history of chronic cough, I suspect this represents the acute phase of usual interstitial pneumonitis (UIP)/pulmonary fibrosis. 4. Approximate 8 cm simple cyst in the visualized liver.   Electronically Signed   By: Evangeline Dakin M.D.   On: 08/15/2014 17:43   Portable Chest 1 View  08/16/2014   CLINICAL DATA:  Chronic interstitial lung disease ; acute onset dyspnea with exertion  EXAM: PORTABLE CHEST - 1 VIEW  COMPARISON:  August 15, 2014 chest radiograph and chest CT  FINDINGS: Widespread reticular type interstitial disease/pulmonary fibrosis remains without change. There is stable asymmetric apical pleural thickening on the right compared to the left. No new opacity. Heart size and pulmonary vascularity are overall within normal limits. No adenopathy apparent. There is upper thoracic dextroscoliosis. There is atherosclerotic change in aorta.  IMPRESSION: Stable chronic  interstitial lung disease/ pulmonary fibrosis. No new opacity to suggest superimposed edema or consolidation. Subtle acute pneumonia could easily be obscured given the degree of underlying interstitial disease, however.   Electronically Signed   By: Lowella Grip M.D.   On: 08/16/2014 08:22    Medications:  I have reviewed the patient's current medications. Scheduled: . aspirin EC  325 mg Oral Daily  . docusate sodium  100 mg Oral BID  . enoxaparin (LOVENOX) injection  40 mg Subcutaneous Q24H  . furosemide  40 mg Oral Daily  . guaiFENesin  600 mg Oral BID  . losartan  50 mg Oral Daily  . pantoprazole  40 mg Oral Daily  . predniSONE  40 mg Oral Q breakfast  . sertraline  100 mg Oral Daily  . sodium chloride  3 mL Intravenous Q12H  . tiotropium  18 mcg Inhalation Daily   Continuous:  [id number], acetaminophen, albuterol, alum & mag hydroxide-simeth, ondansetron (ZOFRAN) IV, ondansetron, polyethylene glycol, traMADol, zolpidem  Assessment/Plan: Active Problems:  Dyspnea  Likely multifactorial, including preexisting interstitial lung disease/fibrosis as well as possible worsening CHF as her prior outpatient BNP was 133 and currently in the 1000 range with SOB. Repeated  her Echo this AM and will compare to last month. Per notes, there was a pending referral to Cards which has not yet happened. Cardiac enzymes negative.  Was given Rocephin/ZIthromax in ER, will continue. Also received IV SoluMedrol in ER, which will be changed to Prednisone 40mg . The CT  was read as new, acute phase ILD, but she in fact has a preexisting dx. Oxygen, nebs as needed. I have spoken to Dr. Melvyn Novas with Pulmonary this AM to arrange consult and will get Cards to see as well. CHF: See above, Lasix 40mg  added, already on ARB. WiIl not push further as her SBP in 108 at this point, reassess in AM  Ruled out PE on CT  GERD with known stricture per Deatra Ina last month, continue PPI  HTN: See above.  Liver cyst,  Large but simple, not related to acute issues, but will need outpatient assessment. Will repeat her LFT's in AM as elevated.  Protein Calorie Malnutrition Albumin 3.0. Nutrition consult.  H/O Breast CA: No masses seen on chest CT.  Hyponatremia, mild: Will continue to follow, may be related to CHF as well.   LOS: 1 day   Khyra Viscuso W 08/16/2014, 9:11 AM

## 2014-08-16 NOTE — Consult Note (Signed)
Primary Physician: Primary Cardiologist:   HPI:  Asked to see re congestive heart failure.  Patient is an 80 yo with history of interstitial lung disease  Admitted with progressive dyspnea, cough.  Patient says she has noted progressive SOB.  It got particularly bad this weekend  Came to hospital   Denies CP  Does note some LE edema  Wt rel stable  Appetite down    CT neg for PE.  Also showed 3 V CAD  Interstial lung disease   Patinet is being treated with IV solumedrol       Past Medical History  Diagnosis Date  . Dyspnea   . Diverticulosis 2008  . Internal hemorrhoids   . Esophageal stricture   . Arthritis   . Breast cancer 1969  . GERD (gastroesophageal reflux disease)   . Gallstones   . Depression   . HLD (hyperlipidemia)   . HTN (hypertension)   . Obesity   . Colon polyp 2013    TUBULAR ADENOMA (X1    Medications Prior to Admission  Medication Sig Dispense Refill  . acetaminophen (TYLENOL 8 HOUR) 650 MG CR tablet Take 1,300 mg by mouth 2 (two) times daily.      Marland Kitchen esomeprazole (NEXIUM) 20 MG capsule Take 20 mg by mouth daily at 12 noon.      . fluticasone (FLONASE) 50 MCG/ACT nasal spray Place 2 sprays into both nostrils once as needed for allergies or rhinitis.      Marland Kitchen losartan (COZAAR) 50 MG tablet Take 50 mg by mouth daily.       . sertraline (ZOLOFT) 100 MG tablet Take 100 mg by mouth daily.       . traMADol (ULTRAM) 50 MG tablet Take 50 mg by mouth every 6 (six) hours as needed for moderate pain.          Marland Kitchen aspirin EC  325 mg Oral Daily  . azithromycin  500 mg Intravenous Q24H  . cefTRIAXone (ROCEPHIN)  IV  1 g Intravenous Q24H  . docusate sodium  100 mg Oral BID  . enoxaparin (LOVENOX) injection  40 mg Subcutaneous Q24H  . furosemide  40 mg Oral Daily  . guaiFENesin  600 mg Oral BID  . losartan  50 mg Oral Daily  . methylPREDNISolone (SOLU-MEDROL) injection  80 mg Intravenous BID  . pantoprazole  40 mg Oral BID AC  . sertraline  100 mg Oral Daily    . sodium chloride  3 mL Intravenous Q12H    Infusions:    Allergies  Allergen Reactions  . Antihistamines, Loratadine-Type   . Codeine   . Darvocet [Propoxyphene N-Acetaminophen]   . Moxifloxacin     REACTION: nightmares and anxiety    History   Social History  . Marital Status: Married    Spouse Name: N/A    Number of Children: 0  . Years of Education: N/A   Occupational History  . retired    Social History Main Topics  . Smoking status: Never Smoker   . Smokeless tobacco: Never Used  . Alcohol Use: 4.2 oz/week    7 Glasses of wine per week     Comment: ocaasional  . Drug Use: No  . Sexual Activity: Not on file   Other Topics Concern  . Not on file   Social History Narrative  . No narrative on file    Family History  Problem Relation Age of Onset  . Heart disease Mother   . Cancer  Mother     bladder  . Esophageal cancer Brother   . Prostate cancer Brother   . Heart disease Brother   . Cancer Brother     sarcoma  . Stomach cancer Neg Hx     REVIEW OF SYSTEMS:  All systems reviewed  Negative to the above problem except as noted above.    PHYSICAL EXAM: Filed Vitals:   08/16/14 1408  BP: 138/50  Pulse: 88  Temp: 97.9 F (36.6 C)  Resp: 20    No intake or output data in the 24 hours ending 08/16/14 1426  General:  Well appearing. No respiratory difficulty HEENT: normal Neck: supple. JVP is increase Carotids 2+ bilat; no bruits. No lymphadenopathy or thryomegaly appreciated. Cor: PMI nondisplaced. Regular rate & rhythm. No rubs, gallops or murmurs. Lungs: Fine crackles at bases L greater than R   Abdomen: soft, nontender, nondistended. No hepatosplenomegaly. No bruits or masses. Good bowel sounds. Extremities: no cyanosis, clubbing, rash  Tr edema Neuro: alert & oriented x 3, cranial nerves grossly intact. moves all 4 extremities w/o difficulty. Affect pleasant.  ECG:  SR 97  INcomp RBBB.  NOnspecific ST T wave chagnes    Results for  orders placed during the hospital encounter of 08/15/14 (from the past 24 hour(s))  CBC WITH DIFFERENTIAL     Status: Abnormal   Collection Time    08/15/14  2:56 PM      Result Value Ref Range   WBC 10.5  4.0 - 10.5 K/uL   RBC 4.12  3.87 - 5.11 MIL/uL   Hemoglobin 12.5  12.0 - 15.0 g/dL   HCT 36.8  36.0 - 46.0 %   MCV 89.3  78.0 - 100.0 fL   MCH 30.3  26.0 - 34.0 pg   MCHC 34.0  30.0 - 36.0 g/dL   RDW 14.3  11.5 - 15.5 %   Platelets 417 (*) 150 - 400 K/uL   Neutrophils Relative % 71  43 - 77 %   Neutro Abs 7.4  1.7 - 7.7 K/uL   Lymphocytes Relative 17  12 - 46 %   Lymphs Abs 1.8  0.7 - 4.0 K/uL   Monocytes Relative 12  3 - 12 %   Monocytes Absolute 1.3 (*) 0.1 - 1.0 K/uL   Eosinophils Relative 0  0 - 5 %   Eosinophils Absolute 0.0  0.0 - 0.7 K/uL   Basophils Relative 0  0 - 1 %   Basophils Absolute 0.0  0.0 - 0.1 K/uL  COMPREHENSIVE METABOLIC PANEL     Status: Abnormal   Collection Time    08/15/14  2:56 PM      Result Value Ref Range   Sodium 130 (*) 137 - 147 mEq/L   Potassium 4.5  3.7 - 5.3 mEq/L   Chloride 94 (*) 96 - 112 mEq/L   CO2 22  19 - 32 mEq/L   Glucose, Bld 115 (*) 70 - 99 mg/dL   BUN 15  6 - 23 mg/dL   Creatinine, Ser 0.72  0.50 - 1.10 mg/dL   Calcium 9.9  8.4 - 10.5 mg/dL   Total Protein 7.0  6.0 - 8.3 g/dL   Albumin 3.0 (*) 3.5 - 5.2 g/dL   AST 51 (*) 0 - 37 U/L   ALT 47 (*) 0 - 35 U/L   Alkaline Phosphatase 144 (*) 39 - 117 U/L   Total Bilirubin 0.6  0.3 - 1.2 mg/dL   GFR calc non Af  Amer 77 (*) >90 mL/min   GFR calc Af Amer 89 (*) >90 mL/min   Anion gap 14  5 - 15  TROPONIN I     Status: None   Collection Time    08/15/14  2:56 PM      Result Value Ref Range   Troponin I <0.30  <0.30 ng/mL  PRO B NATRIURETIC PEPTIDE     Status: Abnormal   Collection Time    08/15/14  2:57 PM      Result Value Ref Range   Pro B Natriuretic peptide (BNP) 1859.0 (*) 0 - 450 pg/mL  TROPONIN I     Status: None   Collection Time    08/15/14  9:35 PM      Result  Value Ref Range   Troponin I <0.30  <0.30 ng/mL  TROPONIN I     Status: None   Collection Time    08/16/14  2:37 AM      Result Value Ref Range   Troponin I <0.30  <0.30 ng/mL  CBC     Status: Abnormal   Collection Time    08/16/14  3:34 AM      Result Value Ref Range   WBC 7.6  4.0 - 10.5 K/uL   RBC 4.29  3.87 - 5.11 MIL/uL   Hemoglobin 13.0  12.0 - 15.0 g/dL   HCT 37.6  36.0 - 46.0 %   MCV 87.6  78.0 - 100.0 fL   MCH 30.3  26.0 - 34.0 pg   MCHC 34.6  30.0 - 36.0 g/dL   RDW 14.4  11.5 - 15.5 %   Platelets 441 (*) 150 - 400 K/uL  COMPREHENSIVE METABOLIC PANEL     Status: Abnormal   Collection Time    08/16/14  3:34 AM      Result Value Ref Range   Sodium 134 (*) 137 - 147 mEq/L   Potassium 4.2  3.7 - 5.3 mEq/L   Chloride 97  96 - 112 mEq/L   CO2 24  19 - 32 mEq/L   Glucose, Bld 161 (*) 70 - 99 mg/dL   BUN 11  6 - 23 mg/dL   Creatinine, Ser 0.60  0.50 - 1.10 mg/dL   Calcium 9.9  8.4 - 10.5 mg/dL   Total Protein 7.0  6.0 - 8.3 g/dL   Albumin 2.8 (*) 3.5 - 5.2 g/dL   AST 32  0 - 37 U/L   ALT 38 (*) 0 - 35 U/L   Alkaline Phosphatase 130 (*) 39 - 117 U/L   Total Bilirubin 0.6  0.3 - 1.2 mg/dL   GFR calc non Af Amer 81 (*) >90 mL/min   GFR calc Af Amer >90  >90 mL/min   Anion gap 13  5 - 15  TSH     Status: None   Collection Time    08/16/14  3:36 AM      Result Value Ref Range   TSH 0.589  0.350 - 4.500 uIU/mL  PRO B NATRIURETIC PEPTIDE     Status: Abnormal   Collection Time    08/16/14  3:36 AM      Result Value Ref Range   Pro B Natriuretic peptide (BNP) 1717.0 (*) 0 - 450 pg/mL  TROPONIN I     Status: None   Collection Time    08/16/14  9:20 AM      Result Value Ref Range   Troponin I <0.30  <0.30 ng/mL  Dg Chest 2 View  08/15/2014   CLINICAL DATA:  Shortness of breath and cough.  EXAM: CHEST - 2 VIEW  COMPARISON:  No prior chest x-rays. Comparison is made to CT appearance of the lung bases on prior CT studies from 07/30/2014 and 02/18/2012.  FINDINGS: There  is a component of diffuse bilateral chronic lung disease which is fibrotic in nature. Lung bases on prior abdomen CT shows similar changes. There may be component of acute infiltrate, especially in the right upper lung. No pulmonary edema, pleural effusions or pneumothorax identified. The anterior right hemidiaphragm is mildly elevated. The heart size and mediastinal contours are within normal limits. The bony thorax shows degenerative disease of the thoracic spine with associated mild rightward convex scoliosis.  IMPRESSION: Diffuse chronic lung disease which most likely represents pulmonary fibrosis. Without prior chest x-rays, it it would be difficult to exclude acute infiltrate, especially in the right upper lobe.   Electronically Signed   By: Aletta Edouard M.D.   On: 08/15/2014 15:11   Ct Angio Chest W/cm &/or Wo Cm  08/15/2014   CLINICAL DATA:  Chronic cough. Shortness of breath aunt exertion. Abnormal chest x-ray earlier same date. Prior right mastectomy for breast cancer.  EXAM: CT ANGIOGRAPHY CHEST WITH CONTRAST  TECHNIQUE: Multidetector CT imaging of the chest was performed using the standard protocol during bolus administration of intravenous contrast. Multiplanar CT image reconstructions and MIPs were obtained to evaluate the vascular anatomy.  CONTRAST:  172mL OMNIPAQUE IOHEXOL 350 MG/ML IV.  COMPARISON:  No prior CT.  Two-view chest x-ray earlier same date.  FINDINGS: Contrast opacification of the pulmonary arteries is very good. No filling defects within either main pulmonary artery or their branches in either lung to suggest pulmonary embolism. Heart enlarged. Mitral annular calcification. Moderate 3 vessel coronary atherosclerosis. No pericardial effusion. Moderate to severe atherosclerosis involving the thoracic and upper abdominal aorta without aneurysm or dissection.  Severe interstitial lung disease and associated ground-glass opacity throughout both lungs, with involvement of both upper  lobes and both lower lobes. Associated bronchiectasis in the right upper lobe. Biapical pleuroparenchymal scarring, right greater than left. No pulmonary parenchymal nodules or masses. No pleural effusions. Central airways patent with calcified tracheobronchial cartilages.  Normal size lymph nodes in the me mediastinum and in both hila. No significant lymphadenopathy. Prior right mastectomy. Thyroid gland unremarkable.  Large simple cyst in the anterior segment right lobe of liver beneath the dome, maximum measurements approximating 7.1 x 8.3 x 6.8 cm. Visualized upper abdomen otherwise unremarkable. Bone window images demonstrate diffuse thoracic spondylosis and slight exaggeration of the usual thoracic kyphosis.  Review of the MIP images confirms the above findings.  IMPRESSION: 1. No evidence of pulmonary embolism. 2. Cardiomegaly.  Moderate 3 vessel coronary atherosclerosis. 3. Severe interstitial lung disease involving both upper lobes and both lower lobes, with associated ground-glass opacity. Given the history of chronic cough, I suspect this represents the acute phase of usual interstitial pneumonitis (UIP)/pulmonary fibrosis. 4. Approximate 8 cm simple cyst in the visualized liver.   Electronically Signed   By: Evangeline Dakin M.D.   On: 08/15/2014 17:43   Portable Chest 1 View  08/16/2014   CLINICAL DATA:  Chronic interstitial lung disease ; acute onset dyspnea with exertion  EXAM: PORTABLE CHEST - 1 VIEW  COMPARISON:  August 15, 2014 chest radiograph and chest CT  FINDINGS: Widespread reticular type interstitial disease/pulmonary fibrosis remains without change. There is stable asymmetric apical pleural thickening on the right compared to  the left. No new opacity. Heart size and pulmonary vascularity are overall within normal limits. No adenopathy apparent. There is upper thoracic dextroscoliosis. There is atherosclerotic change in aorta.  IMPRESSION: Stable chronic interstitial lung disease/  pulmonary fibrosis. No new opacity to suggest superimposed edema or consolidation. Subtle acute pneumonia could easily be obscured given the degree of underlying interstitial disease, however.   Electronically Signed   By: Lowella Grip M.D.   On: 08/16/2014 08:22     ASSESSMENT:  80 yo who was admitted for evaluation of progressive SOB On exam patient with evid of interstitial lung dz.  Also has some mild increase in volume  Echo reviewed.  Normal LV systolic function..  Mild diastolic dysfunction.  Mild to mod AI. CT with evid of 3 V CAD   Labs signif for elevated BNP   Overall I think the patient has mild acute diastolic CHF  I am not convinced of any ischemia. I agree with diuresis.  Follow I/O and renal function  Dose lasix as needed.  May need additional IV    Above may have been precipitated by lung disease with hypoxia  Will continue to follow.

## 2014-08-16 NOTE — Evaluation (Signed)
Physical Therapy Evaluation Patient Details Name: [name] MRN: [id number] DOB: August 11, 1928 Today's Date: 08/16/2014   History of Present Illness  [name] is an 80 y.o. Female admitted 08/15/14 with SOB. PMH of intertitial lung disease not requiring oxygen, chronic edema, and GI issues. Pt reports increasing fatigue and weakness over the past few weeks.    Clinical Impression  Pt very pleasant and motivated and mobilizing with min assist at this time 2* generalized weakness, fatigue and mild ambulatory balance deficits.  Pt should progress to d/c home with support of family and follow up HHPT as needed dependent on acute stay progress.    Follow Up Recommendations Home health PT (dependent on acute stay progress)    Equipment Recommendations  None recommended by PT    Recommendations for Other Services OT consult     Precautions / Restrictions Precautions Precautions: Fall Precaution Comments: monitor O2 Restrictions Weight Bearing Restrictions: No      Mobility  Bed Mobility Overal bed mobility: Modified Independent             General bed mobility comments: pt able to progress to EOB with some SOB and encouraged to breathe deeply through nose. O2 sats at 93% sitting EOB with O2.   Transfers Overall transfer level: Needs assistance Equipment used: Rolling walker (2 wheeled) Transfers: Sit to/from Stand Sit to Stand: Min guard         General transfer comment: cues for use of UEs, min guard for stability.  Ambulation/Gait Ambulation/Gait assistance: Min assist Ambulation Distance (Feet): 200 Feet Assistive device: Rolling walker (2 wheeled);1 person hand held assist;None Gait Pattern/deviations: Step-through pattern;Decreased step length - right;Decreased step length - left;Shuffle;Wide base of support Gait velocity: decr   General Gait Details: Pt ambulated 50' with RW, min guard assist and cues for posture and position from RW.  Pt ambulated  additional 100 with min HHA and last 50' with no HHA and no AD but noted increased instability.  Stairs            Wheelchair Mobility    Modified Rankin (Stroke Patients Only)       Balance Overall balance assessment: Needs assistance Sitting-balance support: Feet supported Sitting balance-Leahy Scale: Normal     Standing balance support: No upper extremity supported Standing balance-Leahy Scale: Fair                               Pertinent Vitals/Pain Pain Assessment: No/denies pain    Home Living Family/patient expects to be discharged to:: Private residence Living Arrangements: Alone Available Help at Discharge: Family;Available PRN/intermittently;Neighbor Type of Home: House Home Access: Stairs to enter   [employer] of Steps: 1 step Home Layout: One level Home Equipment: Clinical cytogeneticist - 2 wheels;Cane - single point;Bedside commode;Grab bars - tub/shower      Prior Function Level of Independence: Independent         Comments: pt reports lethargy for the past few weeks     Hand Dominance   Dominant Hand: Right    Extremity/Trunk Assessment   Upper Extremity Assessment: Defer to OT evaluation           Lower Extremity Assessment: Generalized weakness      Cervical / Trunk Assessment: Normal  Communication   Communication: No difficulties  Cognition Arousal/Alertness: Awake/alert Behavior During Therapy: WFL for tasks assessed/performed Overall Cognitive Status: Within Functional Limits for tasks assessed  General Comments      Exercises General Exercises - Lower Extremity Ankle Circles/Pumps: AROM;Both;20 reps;Supine      Assessment/Plan    PT Assessment Patient needs continued PT services  PT Diagnosis Difficulty walking   PT Problem List Decreased strength;Decreased activity tolerance;Decreased balance;Decreased mobility;Decreased knowledge of use of DME  PT  Treatment Interventions DME instruction;Gait training;Stair training;Functional mobility training;Therapeutic activities;Therapeutic exercise;Patient/family education;Balance training   PT Goals (Current goals can be found in the Care Plan section) Acute Rehab PT Goals Patient Stated Goal: Resume previous lifestyle feeling like her old self PT Goal Formulation: With patient Time For Goal Achievement: 08/30/14 Potential to Achieve Goals: Good    Frequency Min 3X/week   Barriers to discharge Decreased caregiver support Pt lives alone but brother is very supportive.    Co-evaluation               End of Session Equipment Utilized During Treatment: Gait belt;Oxygen Activity Tolerance: Patient tolerated treatment well Patient left: in chair;with call bell/phone within reach;with family/visitor present Nurse Communication: Mobility status;Other (comment) (O2 sats)         Time: 6333-5456 PT Time Calculation (min): 25 min   Charges:   PT Evaluation $Initial PT Evaluation Tier I: 1 Procedure PT Treatments $Gait Training: 8-22 mins   PT G Codes:          Brelee Renk 08/16/2014, 1:42 PM

## 2014-08-16 NOTE — Progress Notes (Signed)
RT spoke with PT and she declines ABG attempt. RT spoke with assigned RN and agrees that PT is not in respiratory distress at this time and is oriented to place, time and date.

## 2014-08-16 NOTE — Progress Notes (Signed)
Utilization review completed.  

## 2014-08-16 NOTE — Progress Notes (Signed)
Pt arrived to floor room 1506 via stretcher. VS taken and pt oriented to room and callbell with no complications. Pain 0/10, alert and oriented x4. Gait unsteady with general weakness and SOB. Initial assessment completed, will continue to monitor throughout shift

## 2014-08-16 NOTE — Progress Notes (Signed)
Occupational Therapy Evaluation Patient Details Name: NELMA PHAGAN MRN: [id number] DOB: 11-Mar-1928 Today's Date: 08/16/2014    History of Present Illness Secilia B. Rieger is an 80 y.o. Female admitted 08/15/14 with SOB. PMH of intertitial lung disease not requiring oxygen, chronic edema, and GI issues. Pt reports increasing fatigue and weakness over the past few weeks.     Clinical Impression   PTA pt lived at home alone and was independent with ADLs. Pt currently limited by dyspnea and generalized weakness which impair her independence with ADLs. Pt will benefit from acute OT to increase strength and endurance to promote safe d/c home.     Follow Up Recommendations  Home health OT;Supervision/Assistance - 24 hour    Equipment Recommendations  None recommended by OT    Recommendations for Other Services       Precautions / Restrictions Precautions Precautions: Fall Precaution Comments: monitor O2 Restrictions Weight Bearing Restrictions: No      Mobility Bed Mobility Overal bed mobility: Modified Independent             General bed mobility comments: pt able to progress to EOB with some SOB and encouraged to breathe deeply through nose. O2 sats at 93% sitting EOB with O2.   Transfers                 General transfer comment: Pt declined at this time.          ADL Overall ADL's : Needs assistance/impaired Eating/Feeding: Sitting;Modified independent   Grooming: Modified independent;Sitting   Upper Body Bathing: Sitting;Set up       Upper Body Dressing : Set up;Sitting                     General ADL Comments: Pt breakfast came and pt declined OOB stating "I'm feel too tired and I want to eat breakfast for my strength." Encouraged pt to get OOB for strengthening and endurance, however pt declined. Educated pt on PT coming later today and encouraged to get up at that time.      Vision  Pt wears glasses and reports no change from baseline.    No apparent deficits.                  Perception Perception Perception Tested?: No   Praxis Praxis Praxis tested?: Within functional limits    Pertinent Vitals/Pain Pain Assessment: No/denies pain     Hand Dominance Right   Extremity/Trunk Assessment Upper Extremity Assessment Upper Extremity Assessment: RUE deficits/detail RUE Deficits / Details: reports Rt rotator cuff tear; Pt has ~20* active shoulder flexion and reports pain with PROM attempt. Pt reports it has been this way for "several years." RUE: Unable to fully assess due to pain RUE Coordination: decreased gross motor   Lower Extremity Assessment Lower Extremity Assessment: Defer to PT evaluation   Cervical / Trunk Assessment Cervical / Trunk Assessment: Normal   Communication Communication Communication: No difficulties   Cognition Arousal/Alertness: Awake/alert Behavior During Therapy: WFL for tasks assessed/performed Overall Cognitive Status: Within Functional Limits for tasks assessed                                Home Living Family/patient expects to be discharged to:: Private residence Living Arrangements: Alone Available Help at Discharge: Family;Available PRN/intermittently;Neighbor (brother lives in [place]) Type of Home: House Home Access: Stairs to enter [employer] of Steps: 1 step  Home Layout: One level (with attic and basement)     Bathroom Shower/Tub: Tub/shower unit Shower/tub characteristics: Architectural technologist: Standard     Home Equipment: Clinical cytogeneticist - 2 wheels;Cane - single point;Bedside commode;Grab bars - tub/shower          Prior Functioning/Environment Level of Independence: Independent        Comments: pt reports lethargy for the past few weeks    OT Diagnosis: Generalized weakness   OT Problem List: Decreased strength;Decreased range of motion;Decreased activity tolerance;Cardiopulmonary status limiting activity   OT  Treatment/Interventions: Self-care/ADL training;Therapeutic exercise;Energy conservation;DME and/or AE instruction;Therapeutic activities;Patient/family education;Balance training    OT Goals(Current goals can be found in the care plan section) Acute Rehab OT Goals Patient Stated Goal: to feel less tired OT Goal Formulation: With patient Time For Goal Achievement: 08/30/14 Potential to Achieve Goals: Good  OT Frequency: Min 2X/week   Barriers to D/C: Decreased caregiver support             End of Session Equipment Utilized During Treatment: Oxygen  Activity Tolerance: Patient limited by fatigue Patient left: in bed;with call bell/phone within reach   Time: 0926-0939 OT Time Calculation (min): 13 min Charges:  OT General Charges $OT Visit: 1 Procedure OT Evaluation $Initial OT Evaluation Tier I: 1 Procedure  Juluis Rainier 08/16/2014, 9:48 AM   Cyndie Chime, OTR/L Occupational Therapist [phone] (pager)

## 2014-08-16 NOTE — Progress Notes (Signed)
Echocardiogram 2D Echocardiogram has been performed.  [name] 08/16/2014, 8:39 AM

## 2014-08-17 ENCOUNTER — Inpatient Hospital Stay (HOSPITAL_COMMUNITY): Payer: Medicare Other

## 2014-08-17 DIAGNOSIS — I5033 Acute on chronic diastolic (congestive) heart failure: Secondary | ICD-10-CM | POA: Diagnosis present

## 2014-08-17 DIAGNOSIS — I2510 Atherosclerotic heart disease of native coronary artery without angina pectoris: Secondary | ICD-10-CM

## 2014-08-17 DIAGNOSIS — I10 Essential (primary) hypertension: Secondary | ICD-10-CM

## 2014-08-17 DIAGNOSIS — E46 Unspecified protein-calorie malnutrition: Secondary | ICD-10-CM | POA: Diagnosis present

## 2014-08-17 DIAGNOSIS — I5031 Acute diastolic (congestive) heart failure: Secondary | ICD-10-CM

## 2014-08-17 DIAGNOSIS — R05 Cough: Secondary | ICD-10-CM

## 2014-08-17 DIAGNOSIS — D72829 Elevated white blood cell count, unspecified: Secondary | ICD-10-CM | POA: Diagnosis not present

## 2014-08-17 LAB — PULMONARY FUNCTION TEST
DL/VA % PRED: 70 %
DL/VA: 3.09 ml/min/mmHg/L
DLCO COR % PRED: 46 %
DLCO cor: 9.38 ml/min/mmHg
DLCO unc % pred: 45 %
DLCO unc: 9.26 ml/min/mmHg
FEF 25-75 POST: 1.71 L/s
FEF 25-75 Pre: 1.64 L/sec
FEF2575-%CHANGE-POST: 4 %
FEF2575-%PRED-PRE: 159 %
FEF2575-%Pred-Post: 165 %
FEV1-%Change-Post: 0 %
FEV1-%PRED-POST: 115 %
FEV1-%PRED-PRE: 114 %
FEV1-POST: 1.75 L
FEV1-Pre: 1.74 L
FEV1FVC-%Change-Post: 6 %
FEV1FVC-%Pred-Pre: 108 %
FEV6-%Change-Post: -6 %
FEV6-%Pred-Post: 106 %
FEV6-%Pred-Pre: 113 %
FEV6-PRE: 2.21 L
FEV6-Post: 2.07 L
FEV6FVC-%Change-Post: 0 %
FEV6FVC-%Pred-Post: 105 %
FEV6FVC-%Pred-Pre: 106 %
FVC-%Change-Post: -5 %
FVC-%PRED-POST: 100 %
FVC-%Pred-Pre: 106 %
FVC-Post: 2.09 L
FVC-Pre: 2.21 L
POST FEV1/FVC RATIO: 84 %
POST FEV6/FVC RATIO: 99 %
PRE FEV1/FVC RATIO: 79 %
Pre FEV6/FVC Ratio: 100 %
RV % pred: 54 %
RV: 1.25 L
TLC % pred: 73 %
TLC: 3.39 L

## 2014-08-17 LAB — COMPREHENSIVE METABOLIC PANEL
ALT: 30 U/L (ref 0–35)
AST: 24 U/L (ref 0–37)
Albumin: 3 g/dL — ABNORMAL LOW (ref 3.5–5.2)
Alkaline Phosphatase: 118 U/L — ABNORMAL HIGH (ref 39–117)
Anion gap: 12 (ref 5–15)
BUN: 15 mg/dL (ref 6–23)
CALCIUM: 10.4 mg/dL (ref 8.4–10.5)
CO2: 25 meq/L (ref 19–32)
CREATININE: 0.56 mg/dL (ref 0.50–1.10)
Chloride: 94 mEq/L — ABNORMAL LOW (ref 96–112)
GFR, EST NON AFRICAN AMERICAN: 83 mL/min — AB (ref >90)
Glucose, Bld: 160 mg/dL — ABNORMAL HIGH (ref 70–99)
Potassium: 4.3 mEq/L (ref 3.7–5.3)
SODIUM: 131 meq/L — AB (ref 137–147)
Total Bilirubin: 0.4 mg/dL (ref 0.3–1.2)
Total Protein: 7.1 g/dL (ref 6.0–8.3)

## 2014-08-17 LAB — CBC WITH DIFFERENTIAL/PLATELET
Basophils Absolute: 0 10*3/uL (ref 0.0–0.1)
Basophils Relative: 0 % (ref 0–1)
EOS PCT: 0 % (ref 0–5)
Eosinophils Absolute: 0 10*3/uL (ref 0.0–0.7)
HEMATOCRIT: 36.9 % (ref 36.0–46.0)
Hemoglobin: 13 g/dL (ref 12.0–15.0)
LYMPHS ABS: 1.2 10*3/uL (ref 0.7–4.0)
Lymphocytes Relative: 7 % — ABNORMAL LOW (ref 12–46)
MCH: 31.1 pg (ref 26.0–34.0)
MCHC: 35.2 g/dL (ref 30.0–36.0)
MCV: 88.3 fL (ref 78.0–100.0)
Monocytes Absolute: 0.9 10*3/uL (ref 0.1–1.0)
Monocytes Relative: 5 % (ref 3–12)
Neutro Abs: 16.6 10*3/uL — ABNORMAL HIGH (ref 1.7–7.7)
Neutrophils Relative %: 88 % — ABNORMAL HIGH (ref 43–77)
Platelets: 506 10*3/uL — ABNORMAL HIGH (ref 150–400)
RBC: 4.18 MIL/uL (ref 3.87–5.11)
RDW: 14.2 % (ref 11.5–15.5)
WBC: 18.8 10*3/uL — AB (ref 4.0–10.5)

## 2014-08-17 LAB — PRO B NATRIURETIC PEPTIDE: Pro B Natriuretic peptide (BNP): 802.7 pg/mL — ABNORMAL HIGH (ref 0–450)

## 2014-08-17 LAB — RHEUMATOID FACTOR
RHEUMATOID FACTOR: 31 [IU]/mL — AB (ref <=14)
Rhuematoid fact SerPl-aCnc: 32 IU/mL — ABNORMAL HIGH (ref <=14)

## 2014-08-17 LAB — SEDIMENTATION RATE: Sed Rate: 67 mm/hr — ABNORMAL HIGH (ref 0–22)

## 2014-08-17 MED ORDER — BOOST / RESOURCE BREEZE PO LIQD
1.0000 | Freq: Every day | ORAL | Status: DC | PRN
  Administered 2014-08-18: 1 via ORAL

## 2014-08-17 MED ORDER — ENSURE PUDDING PO PUDG
1.0000 | Freq: Two times a day (BID) | ORAL | Status: DC
  Administered 2014-08-17: 1 via ORAL
  Filled 2014-08-17 (×9): qty 1

## 2014-08-17 MED ORDER — ATORVASTATIN CALCIUM 20 MG PO TABS
20.0000 mg | ORAL_TABLET | Freq: Every day | ORAL | Status: DC
  Administered 2014-08-17: 20 mg via ORAL
  Filled 2014-08-17 (×4): qty 1

## 2014-08-17 MED ORDER — ENSURE COMPLETE PO LIQD
237.0000 mL | Freq: Two times a day (BID) | ORAL | Status: DC
  Administered 2014-08-17: 237 mL via ORAL

## 2014-08-17 MED ORDER — ALBUTEROL SULFATE (2.5 MG/3ML) 0.083% IN NEBU
2.5000 mg | INHALATION_SOLUTION | Freq: Once | RESPIRATORY_TRACT | Status: AC
  Administered 2014-08-17: 2.5 mg via RESPIRATORY_TRACT

## 2014-08-17 NOTE — Progress Notes (Signed)
PULMONARY / CRITICAL CARE MEDICINE   Name: [name] MRN: [id number] DOB: 30-Jan-1928    ADMISSION DATE:  08/15/2014 CONSULTATION DATE:  08/16/14   REFERRING MD : Osborne Casco for Dr Lang Snow  CHIEF COMPLAINT:  sob  INITIAL PRESENTATION:  80 yo female never smoker with hx of pulmonary fibrosis since at least April 2013 (?cause) presented with acute on chronic dyspnea associated with recent onset of rhinitis/post-nasal drip, productive cough, and weakness.  She has been followed previously by Dr. Melvyn Novas.  STUDIES:  PFT's 03/10/10 FEV1 1.68 (130%) ratio 74 , DLCO 48% (vs 45% 04/22/07) corrects to 60%  CT 08/15/14 >> moderate CAD, UIP pattern of pulmonary fibrosis with b/l areas of GGO, BTX RUL ESR 08/16/14  >> 67 Echo 08/16/14 >> mild LVH, EF 60 to 93%, diastolic dysfx  SIGNIFICANT EVENTS: Empiric steroids started pm 08/16/14   SUBJECTIVE:  Still has cough, but sputum is now brown.  Denies chest pain except when she takes deep breath.  Still feels weak when walking.  VITAL SIGNS: Temp:  [97.9 F (36.6 C)-98.1 F (36.7 C)] 97.9 F (36.6 C) (10/12 0530) Pulse Rate:  [85-95] 85 (10/12 0530) Resp:  [18-20] 18 (10/12 0530) BP: (138-163)/(50-69) 163/69 mmHg (10/12 0530) SpO2:  [95 %-98 %] 95 % (10/12 0530) INTAKE / OUTPUT:  Intake/Output Summary (Last 24 hours) at 08/17/14 0859 Last data filed at 08/17/14 0300  Gross per 24 hour  Intake      0 ml  Output    500 ml  Net   -500 ml    PHYSICAL EXAMINATION: General: no distress HEENT: clear nasal drainage Cardiac: regular, no murmur Chest: b/l crackles Abd: soft, non tender Ext: no joint swelling Skin: no rashes  LABS:  CBC  Recent Labs Lab 08/15/14 1456 08/16/14 0334 08/17/14 0515  WBC 10.5 7.6 18.8*  HGB 12.5 13.0 13.0  HCT 36.8 37.6 36.9  PLT 417* 441* 506*   BMET  Recent Labs Lab 08/15/14 1456 08/16/14 0334 08/17/14 0515  NA 130* 134* 131*  K 4.5 4.2 4.3  CL 94* 97 94*  CO2 _0 BUN _1 CREATININE 0.72 0.60 0.56  GLUCOSE 115* 161* 160*   Electrolytes  Recent Labs Lab 08/15/14 1456 08/16/14 0334 08/17/14 0515  CALCIUM 9.9 9.9 10.4   Liver Enzymes  Recent Labs Lab 08/15/14 1456 08/16/14 0334 08/17/14 0515  AST 51* 32 24  ALT 47* 38* 30  ALKPHOS 144* 130* 118*  BILITOT 0.6 0.6 0.4  ALBUMIN 3.0* 2.8* 3.0*   Cardiac Enzymes  Recent Labs Lab 08/15/14 1457 08/15/14 2135 08/16/14 0237 08/16/14 0336 08/16/14 0920 08/17/14 0515  TROPONINI  --  <0.30 <0.30  --  <0.30  --   PROBNP 1859.0*  --   --  1717.0*  --  802.7*   Imaging Dg Chest 2 View  08/15/2014   CLINICAL DATA:  Shortness of breath and cough.  EXAM: CHEST - 2 VIEW  COMPARISON:  No prior chest x-rays. Comparison is made to CT appearance of the lung bases on prior CT studies from 07/30/2014 and 02/18/2012.  FINDINGS: There is a component of diffuse bilateral chronic lung disease which is fibrotic in nature. Lung bases on prior abdomen CT shows similar changes. There may be component of acute infiltrate, especially in the right upper lung. No pulmonary edema, pleural effusions or pneumothorax identified. The anterior right hemidiaphragm is mildly elevated. The heart size and mediastinal contours are within normal limits. The  bony thorax shows degenerative disease of the thoracic spine with associated mild rightward convex scoliosis.  IMPRESSION: Diffuse chronic lung disease which most likely represents pulmonary fibrosis. Without prior chest x-rays, it it would be difficult to exclude acute infiltrate, especially in the right upper lobe.   Electronically Signed   By: Aletta Edouard M.D.   On: 08/15/2014 15:11   Ct Angio Chest W/cm &/or Wo Cm  08/15/2014   CLINICAL DATA:  Chronic cough. Shortness of breath aunt exertion. Abnormal chest x-ray earlier same date. Prior right mastectomy for breast cancer.  EXAM: CT ANGIOGRAPHY CHEST WITH CONTRAST  TECHNIQUE: Multidetector CT imaging of the chest was performed  using the standard protocol during bolus administration of intravenous contrast. Multiplanar CT image reconstructions and MIPs were obtained to evaluate the vascular anatomy.  CONTRAST:  147m OMNIPAQUE IOHEXOL 350 MG/ML IV.  COMPARISON:  No prior CT.  Two-view chest x-ray earlier same date.  FINDINGS: Contrast opacification of the pulmonary arteries is very good. No filling defects within either main pulmonary artery or their branches in either lung to suggest pulmonary embolism. Heart enlarged. Mitral annular calcification. Moderate 3 vessel coronary atherosclerosis. No pericardial effusion. Moderate to severe atherosclerosis involving the thoracic and upper abdominal aorta without aneurysm or dissection.  Severe interstitial lung disease and associated ground-glass opacity throughout both lungs, with involvement of both upper lobes and both lower lobes. Associated bronchiectasis in the right upper lobe. Biapical pleuroparenchymal scarring, right greater than left. No pulmonary parenchymal nodules or masses. No pleural effusions. Central airways patent with calcified tracheobronchial cartilages.  Normal size lymph nodes in the me mediastinum and in both hila. No significant lymphadenopathy. Prior right mastectomy. Thyroid gland unremarkable.  Large simple cyst in the anterior segment right lobe of liver beneath the dome, maximum measurements approximating 7.1 x 8.3 x 6.8 cm. Visualized upper abdomen otherwise unremarkable. Bone window images demonstrate diffuse thoracic spondylosis and slight exaggeration of the usual thoracic kyphosis.  Review of the MIP images confirms the above findings.  IMPRESSION: 1. No evidence of pulmonary embolism. 2. Cardiomegaly.  Moderate 3 vessel coronary atherosclerosis. 3. Severe interstitial lung disease involving both upper lobes and both lower lobes, with associated ground-glass opacity. Given the history of chronic cough, I suspect this represents the acute phase of usual  interstitial pneumonitis (UIP)/pulmonary fibrosis. 4. Approximate 8 cm simple cyst in the visualized liver.   Electronically Signed   By: TEvangeline DakinM.D.   On: 08/15/2014 17:43   Portable Chest 1 View  08/16/2014   CLINICAL DATA:  Chronic interstitial lung disease ; acute onset dyspnea with exertion  EXAM: PORTABLE CHEST - 1 VIEW  COMPARISON:  August 15, 2014 chest radiograph and chest CT  FINDINGS: Widespread reticular type interstitial disease/pulmonary fibrosis remains without change. There is stable asymmetric apical pleural thickening on the right compared to the left. No new opacity. Heart size and pulmonary vascularity are overall within normal limits. No adenopathy apparent. There is upper thoracic dextroscoliosis. There is atherosclerotic change in aorta.  IMPRESSION: Stable chronic interstitial lung disease/ pulmonary fibrosis. No new opacity to suggest superimposed edema or consolidation. Subtle acute pneumonia could easily be obscured given the degree of underlying interstitial disease, however.   Electronically Signed   By: WLowella GripM.D.   On: 08/16/2014 08:22    ASSESSMENT / PLAN:  Acute hypoxic respiratory failure 2nd to GGO pulmonary infiltrates >> likely combination of acute pneumonitis in setting of undefined ILD with UIP pattern, acute diastolic heart failure,  and possible viral pneumonitis.  She has hx of "arthritis", and reports family hx of "fungus" exposure that caused lung scarring in her brother.  Acute hypoxic respiratory failure. Plan: Oxygen to keep SpO2 > 92%  Undefined ILD with UIP pattern on CT chest, and possible acute pneumonitis with GGO changes.  Has elevated ESR. Plan: Continue solumedrol F/u ANA, RF, anti CCP, ANCA, Scl 70, aldolase, SS a, SS b F/u CXR 10//13  ?acute respiratory infection with possible viral pneumonitis. Plan: Symptomatic management Day 3 of rocephin, zithromax per primary team, started 89/21  Acute diastolic heart  failure. Plan: Per primary team and cardiology  Cough. Plan: Prn mucinex, tramadol  Chesley Mires, MD [facility] Pulmonary/Critical Care 08/17/2014, 9:57 AM Pager:  [phone] After 3pm call: [phone]

## 2014-08-17 NOTE — Progress Notes (Signed)
Subjective: Admitted with progressive increase in dyspnea on exertion, cough and generalized weakness.  Has been worsening over the past 6-12 months and has reached point that she is struggling to function at home.  Reports minimal, if any, improvement with steroids, diuretics and antibiotics.  Objective: Vital signs in last 24 hours: Temp:  [97.9 F (36.6 C)-98.1 F (36.7 C)] 97.9 F (36.6 C) (10/12 0530) Pulse Rate:  [85-95] 85 (10/12 0530) Resp:  [18-20] 18 (10/12 0530) BP: (138-163)/(50-69) 163/69 mmHg (10/12 0530) SpO2:  [95 %-98 %] 95 % (10/12 0530) Weight change:  Last BM Date: 08/16/14  CBG (last 3)  No results found for this basename: GLUCAP,  in the last 72 hours  Intake/Output from previous day: 10/11 0701 - 10/12 0700 In: -  Out: 500 [Urine:500] Intake/Output this shift:    General appearance: alert and no distress Eyes: no scleral icterus Throat: oropharynx moist without erythema Resp: bilateral (right>left) basilar crackles 1/3 way up; coughs with each inspiration Cardio: regular rate and rhythm GI: soft, non-tender; bowel sounds normal; no masses,  no organomegaly Extremities: no clubbing, cyanosis; 1+ bilateral edema   Lab Results:  Recent Labs  08/16/14 0334 08/17/14 0515  NA 134* 131*  K 4.2 4.3  CL 97 94*  CO2 24 25  GLUCOSE 161* 160*  BUN 11 15  CREATININE 0.60 0.56  CALCIUM 9.9 10.4    Recent Labs  08/16/14 0334 08/17/14 0515  AST 32 24  ALT 38* 30  ALKPHOS 130* 118*  BILITOT 0.6 0.4  PROT 7.0 7.1  ALBUMIN 2.8* 3.0*    Recent Labs  08/15/14 1456 08/16/14 0334 08/17/14 0515  WBC 10.5 7.6 18.8*  NEUTROABS 7.4  --  16.6*  HGB 12.5 13.0 13.0  HCT 36.8 37.6 36.9  MCV 89.3 87.6 88.3  PLT 417* 441* 506*   No results found for this basename: INR, PROTIME    Recent Labs  08/15/14 2135 08/16/14 0237 08/16/14 0920  TROPONINI <0.30 <0.30 <0.30    Recent Labs  08/16/14 0336  TSH 0.589    Studies/Results: Dg Chest 2  View  08/15/2014   CLINICAL DATA:  Shortness of breath and cough.  EXAM: CHEST - 2 VIEW  COMPARISON:  No prior chest x-rays. Comparison is made to CT appearance of the lung bases on prior CT studies from 07/30/2014 and 02/18/2012.  FINDINGS: There is a component of diffuse bilateral chronic lung disease which is fibrotic in nature. Lung bases on prior abdomen CT shows similar changes. There may be component of acute infiltrate, especially in the right upper lung. No pulmonary edema, pleural effusions or pneumothorax identified. The anterior right hemidiaphragm is mildly elevated. The heart size and mediastinal contours are within normal limits. The bony thorax shows degenerative disease of the thoracic spine with associated mild rightward convex scoliosis.  IMPRESSION: Diffuse chronic lung disease which most likely represents pulmonary fibrosis. Without prior chest x-rays, it it would be difficult to exclude acute infiltrate, especially in the right upper lobe.   Electronically Signed   By: Aletta Edouard M.D.   On: 08/15/2014 15:11   Ct Angio Chest W/cm &/or Wo Cm  08/15/2014   CLINICAL DATA:  Chronic cough. Shortness of breath aunt exertion. Abnormal chest x-ray earlier same date. Prior right mastectomy for breast cancer.  EXAM: CT ANGIOGRAPHY CHEST WITH CONTRAST  TECHNIQUE: Multidetector CT imaging of the chest was performed using the standard protocol during bolus administration of intravenous contrast. Multiplanar CT image reconstructions and MIPs  were obtained to evaluate the vascular anatomy.  CONTRAST:  171mL OMNIPAQUE IOHEXOL 350 MG/ML IV.  COMPARISON:  No prior CT.  Two-view chest x-ray earlier same date.  FINDINGS: Contrast opacification of the pulmonary arteries is very good. No filling defects within either main pulmonary artery or their branches in either lung to suggest pulmonary embolism. Heart enlarged. Mitral annular calcification. Moderate 3 vessel coronary atherosclerosis. No pericardial  effusion. Moderate to severe atherosclerosis involving the thoracic and upper abdominal aorta without aneurysm or dissection.  Severe interstitial lung disease and associated ground-glass opacity throughout both lungs, with involvement of both upper lobes and both lower lobes. Associated bronchiectasis in the right upper lobe. Biapical pleuroparenchymal scarring, right greater than left. No pulmonary parenchymal nodules or masses. No pleural effusions. Central airways patent with calcified tracheobronchial cartilages.  Normal size lymph nodes in the me mediastinum and in both hila. No significant lymphadenopathy. Prior right mastectomy. Thyroid gland unremarkable.  Large simple cyst in the anterior segment right lobe of liver beneath the dome, maximum measurements approximating 7.1 x 8.3 x 6.8 cm. Visualized upper abdomen otherwise unremarkable. Bone window images demonstrate diffuse thoracic spondylosis and slight exaggeration of the usual thoracic kyphosis.  Review of the MIP images confirms the above findings.  IMPRESSION: 1. No evidence of pulmonary embolism. 2. Cardiomegaly.  Moderate 3 vessel coronary atherosclerosis. 3. Severe interstitial lung disease involving both upper lobes and both lower lobes, with associated ground-glass opacity. Given the history of chronic cough, I suspect this represents the acute phase of usual interstitial pneumonitis (UIP)/pulmonary fibrosis. 4. Approximate 8 cm simple cyst in the visualized liver.   Electronically Signed   By: Evangeline Dakin M.D.   On: 08/15/2014 17:43   Portable Chest 1 View  08/16/2014   CLINICAL DATA:  Chronic interstitial lung disease ; acute onset dyspnea with exertion  EXAM: PORTABLE CHEST - 1 VIEW  COMPARISON:  August 15, 2014 chest radiograph and chest CT  FINDINGS: Widespread reticular type interstitial disease/pulmonary fibrosis remains without change. There is stable asymmetric apical pleural thickening on the right compared to the left. No  new opacity. Heart size and pulmonary vascularity are overall within normal limits. No adenopathy apparent. There is upper thoracic dextroscoliosis. There is atherosclerotic change in aorta.  IMPRESSION: Stable chronic interstitial lung disease/ pulmonary fibrosis. No new opacity to suggest superimposed edema or consolidation. Subtle acute pneumonia could easily be obscured given the degree of underlying interstitial disease, however.   Electronically Signed   By: Lowella Grip M.D.   On: 08/16/2014 08:22   Echocardiogram (10/11)- - Mild LVH with LVEF 86-57%, grade 1 diastolic dysfunction with increased filling pressures. Cannot exclude hypokinesis of the basal inferior myocardium, but could be anatomical portion of annulus. Mild left atrial enlargement. Mild to moderate aortic regurgitation. Unable to assess PASP, trivial tricuspid regurgitation.   Medications: Scheduled: . aspirin EC  325 mg Oral Daily  . atorvastatin  20 mg Oral q1800  . azithromycin  500 mg Intravenous Q24H  . cefTRIAXone (ROCEPHIN)  IV  1 g Intravenous Q24H  . docusate sodium  100 mg Oral BID  . enoxaparin (LOVENOX) injection  40 mg Subcutaneous Q24H  . furosemide  40 mg Oral Daily  . guaiFENesin  600 mg Oral BID  . losartan  50 mg Oral Daily  . methylPREDNISolone (SOLU-MEDROL) injection  80 mg Intravenous BID  . pantoprazole  40 mg Oral BID AC  . sertraline  100 mg Oral Daily  . sodium chloride  3  mL Intravenous Q12H   Continuous:   Assessment/Plan: 1. Acute on Chronic respiratory failure with hypoxemia- suspect progressive pulmonary fibrosis.  ILD first noted on CXR and with decreased DLCO in 5/11 with initial visit with Dr. Melvyn Novas in 1/12- since then symptoms have slowly progressed and have recently reached a critical point with worsening dyspnea, cough and generalized weakness.  Continue empiric steroids (changed to IV per Dr. Melvyn Novas), antibiotics and O2.  Will obtain updated PFTs as inpatient as I suspect she  has minimal reversibility.   2. Postinflammatory pulmonary fibrosis- will likely need home O2.  Consider Pulmonary Rehab.  Further management per pulmonology. 3.  Acute Diastolic CHF- EF 82-50%.  Will continue oral lasix and ARB.  Avoid BBlocker due to underlying lung disease. 4. CAD- started on statin therapy (fasting lipids pending).  Low suspicion of ischemia as cause of dyspnea. 5. Steroid induced leukocytosis- monitor.  On empiric antibiotics but low suspicion of pneumonia. 6. Protein-calorie malnutrition- add Ensure   7. Disposition- continue PT/OT- may need SNF rehab vs. Home with in-home care.  SW/CM consults to assist with discharge needs/options.  Anticipate discharge in 1-2 days when able to transition to oral steroids per pulmonology.     LOS: 2 days   Marton Redwood 08/17/2014, 8:39 AM

## 2014-08-17 NOTE — Progress Notes (Addendum)
INITIAL NUTRITION ASSESSMENT  DOCUMENTATION CODES Per approved criteria  -Not Applicable   INTERVENTION: - D/C Ensure Complete - Add Ensure Pudding po BID, each supplement provides 170 kcal and 4 grams of protein - Add Resource Breeze po PRN, each supplement provides 250 kcal and 9 grams of protein  NUTRITION DIAGNOSIS: Inadequate oral intake related to shortness of breath as evidenced by reported poor po.   Goal: Pt to meet >/= 90% of their estimated nutrition needs   Monitor:  Weight trend, po intake, acceptance of supplements, labs  Reason for Assessment: Consult for poor po  80 y.o. female  Admitting Dx: <principal problem not specified>  ASSESSMENT: 80 year old female with a history of interstitial lung disease not requiring oxygen, who came to the ER this evening with worsening of her chronic complaints. Has chronic cough, but started developing dyspnea on exertion. She has a myriad of other complaints including chronic edema and GI issues which have been addressed in the past month.   - Pt reports a decrease in appetite. She says that she eats <50% of her meals. She does not like Ensure Complete. Pt with no weight loss, possibly from edema? She agreed to try Ensure Pudding and Resource Breeze PRN if she is not eating well at meal times.  - Pt with no signs of far or muscle wasting.   Labs: Na low K and BUN WNL  Height: Ht Readings from Last 1 Encounters:  08/15/14 _0  (1.575 m)    Weight: Wt Readings from Last 1 Encounters:  08/15/14 159 lb 2.8 oz (72.2 kg)    Ideal Body Weight: 50.1 kg  % Ideal Body Weight: 144%  Wt Readings from Last 10 Encounters:  08/15/14 159 lb 2.8 oz (72.2 kg)  08/07/14 162 lb (73.483 kg)  08/03/14 162 lb (73.483 kg)  08/20/12 158 lb (71.668 kg)  07/24/12 158 lb (71.668 kg)  12/08/11 161 lb (73.029 kg)  11/28/10 155 lb 6.1 oz (70.48 kg)    Usual Body Weight: 158-162  % Usual Body Weight: 100%  BMI:  Body mass index is  29.11 kg/(m^2).  Estimated Nutritional Needs: Kcal: 1250-1500 Protein: 75-85 g Fluid: >1.5 L/day  Skin: Intact  Diet Order: Cardiac  EDUCATION NEEDS: -Education needs addressed   Intake/Output Summary (Last 24 hours) at 08/17/14 1306 Last data filed at 08/17/14 0900  Gross per 24 hour  Intake      0 ml  Output    700 ml  Net   -700 ml    Last BM: prior to admission   Labs:   Recent Labs Lab 08/15/14 1456 08/16/14 0334 08/17/14 0515  NA 130* 134* 131*  K 4.5 4.2 4.3  CL 94* 97 94*  CO2 _1 BUN _2 CREATININE 0.72 0.60 0.56  CALCIUM 9.9 9.9 10.4  GLUCOSE 115* 161* 160*    CBG (last 3)  No results found for this basename: GLUCAP,  in the last 72 hours  Scheduled Meds: . aspirin EC  325 mg Oral Daily  . atorvastatin  20 mg Oral q1800  . azithromycin  500 mg Intravenous Q24H  . cefTRIAXone (ROCEPHIN)  IV  1 g Intravenous Q24H  . docusate sodium  100 mg Oral BID  . enoxaparin (LOVENOX) injection  40 mg Subcutaneous Q24H  . feeding supplement (ENSURE COMPLETE)  237 mL Oral BID BM  . furosemide  40 mg Oral Daily  . guaiFENesin  600 mg Oral BID  .  losartan  50 mg Oral Daily  . methylPREDNISolone (SOLU-MEDROL) injection  80 mg Intravenous BID  . pantoprazole  40 mg Oral BID AC  . sertraline  100 mg Oral Daily  . sodium chloride  3 mL Intravenous Q12H    Continuous Infusions:   Past Medical History  Diagnosis Date  . Dyspnea   . Diverticulosis 2008  . Internal hemorrhoids   . Esophageal stricture   . Arthritis   . Breast cancer 1969  . GERD (gastroesophageal reflux disease)   . Gallstones   . Depression   . HLD (hyperlipidemia)   . HTN (hypertension)   . Obesity   . Colon polyp 2013    TUBULAR ADENOMA (X1    Past Surgical History  Procedure Laterality Date  . Abdominal hysterectomy    . Cholecystectomy    . Mastectomy      right  . Appendectomy    . Foot surgery    . Breast surgery      Laurette Schimke RD, LDN

## 2014-08-17 NOTE — Progress Notes (Signed)
Physical Therapy Treatment Patient Details Name: [name] MRN: [id number] DOB: [birth year] Today's Date: 08/17/2014    History of Present Illness Brighten B. Sheriff is an 80 y.o. Female admitted 08/15/14 with SOB. PMH of intertitial lung disease not requiring oxygen, chronic edema, and GI issues. Pt reports increasing fatigue and weakness over the past few weeks.      PT Comments    Pt in bed on 2 lts O2 sats 96%.  Removed for trial.  Assisted OOb to [facility] to void then amb in hallway.  Pt stated she felt better using the walker because she still feels bad and is weak.  Amb on RA sats avg 94%.  Pt required x 3 standing rest breaks due to persistent cough and mild SOB.    Follow Up Recommendations  Home health PT     Equipment Recommendations  Rolling walker with 5" wheels   (YOUTH) Pt stated she has a walker at home but it belonged to her spouse and its too tall.  Recommendations for Other Services       Precautions / Restrictions Precautions Precautions: Fall Precaution Comments: monitor O2 Restrictions Weight Bearing Restrictions: No    Mobility  Bed Mobility Overal bed mobility: Modified Independent             General bed mobility comments: increased time due to weakness/fatigue  Transfers Overall transfer level: Needs assistance Equipment used: Rolling walker (2 wheeled) Transfers: Sit to/from Stand Sit to Stand: Min guard Stand pivot transfers: Min guard       General transfer comment: cues for use of UEs, min guard for stability.  Ambulation/Gait Ambulation/Gait assistance: Min guard Ambulation Distance (Feet): 175 Feet Assistive device: Rolling walker (2 wheeled) (pt felt better using RW) Gait Pattern/deviations: Step-through pattern;Decreased stride length;Trunk flexed Gait velocity: decreased   General Gait Details: pt stated she felt safer/better using RW.  Noted mild SOB RA 94%.  Required x 3 standing rest breaks.  Persistant cough.  limited  activity tolerance.  Overall, just not feeling well.     Stairs            Wheelchair Mobility    Modified Rankin (Stroke Patients Only)       Balance                                    Cognition Arousal/Alertness: Awake/alert Behavior During Therapy: WFL for tasks assessed/performed Overall Cognitive Status: Within Functional Limits for tasks assessed                      Exercises      General Comments        Pertinent Vitals/Pain Pain Assessment: No/denies pain    Home Living                      Prior Function            PT Goals (current goals can now be found in the care plan section) Progress towards PT goals: Progressing toward goals    Frequency  Min 3X/week    PT Plan      Co-evaluation             End of Session Equipment Utilized During Treatment: Gait belt Activity Tolerance: Patient limited by fatigue Patient left: in chair;with family/visitor present     Time: 5462-7035 PT Time Calculation (min): 28  min  Charges:  $Gait Training: 8-22 mins $Therapeutic Activity: 8-22 mins                    G Codes:      Rica Koyanagi  PTA [facility]  Acute  Rehab Pager      [phone]

## 2014-08-17 NOTE — Progress Notes (Signed)
TELEMETRY: Reviewed telemetry pt in NSR: Filed Vitals:   08/16/14 0808 08/16/14 1408 08/16/14 2114 08/17/14 0530  BP:  138/50 157/64 163/69  Pulse:  88 95 85  Temp:  97.9 F (36.6 C) 98.1 F (36.7 C) 97.9 F (36.6 C)  TempSrc:  Oral Oral Oral  Resp:  20 18 18   Height:      Weight:      SpO2: 96% 98% 96% 95%    Intake/Output Summary (Last 24 hours) at 08/17/14 0806 Last data filed at 08/17/14 0300  Gross per 24 hour  Intake      0 ml  Output    500 ml  Net   -500 ml   Filed Weights   08/15/14 2026  Weight: 159 lb 2.8 oz (72.2 kg)    Subjective States breathing is improved but feels very weak. Coughing with some production. No chest pain.  Marland Kitchen aspirin EC  325 mg Oral Daily  . azithromycin  500 mg Intravenous Q24H  . cefTRIAXone (ROCEPHIN)  IV  1 g Intravenous Q24H  . docusate sodium  100 mg Oral BID  . enoxaparin (LOVENOX) injection  40 mg Subcutaneous Q24H  . furosemide  40 mg Oral Daily  . guaiFENesin  600 mg Oral BID  . losartan  50 mg Oral Daily  . methylPREDNISolone (SOLU-MEDROL) injection  80 mg Intravenous BID  . pantoprazole  40 mg Oral BID AC  . sertraline  100 mg Oral Daily  . sodium chloride  3 mL Intravenous Q12H      LABS: Basic Metabolic Panel:  Recent Labs  08/16/14 0334 08/17/14 0515  NA 134* 131*  K 4.2 4.3  CL 97 94*  CO2 24 25  GLUCOSE 161* 160*  BUN 11 15  CREATININE 0.60 0.56  CALCIUM 9.9 10.4   Liver Function Tests:  Recent Labs  08/16/14 0334 08/17/14 0515  AST 32 24  ALT 38* 30  ALKPHOS 130* 118*  BILITOT 0.6 0.4  PROT 7.0 7.1  ALBUMIN 2.8* 3.0*   No results found for this basename: LIPASE, AMYLASE,  in the last 72 hours CBC:  Recent Labs  08/15/14 1456 08/16/14 0334 08/17/14 0515  WBC 10.5 7.6 18.8*  NEUTROABS 7.4  --  16.6*  HGB 12.5 13.0 13.0  HCT 36.8 37.6 36.9  MCV 89.3 87.6 88.3  PLT 417* 441* 506*   Cardiac Enzymes:  Recent Labs  08/15/14 2135 08/16/14 0237 08/16/14 0920  TROPONINI <0.30  <0.30 <0.30   BNP:  Recent Labs  08/15/14 1457 08/16/14 0336 08/17/14 0515  PROBNP 1859.0* 1717.0* 802.7*   D-Dimer: No results found for this basename: DDIMER,  in the last 72 hours Hemoglobin A1C: No results found for this basename: HGBA1C,  in the last 72 hours Fasting Lipid Panel: No results found for this basename: CHOL, HDL, LDLCALC, TRIG, CHOLHDL, LDLDIRECT,  in the last 72 hours Thyroid Function Tests:  Recent Labs  08/16/14 0336  TSH 0.589     Radiology/Studies:  Dg Chest 2 View  08/15/2014   CLINICAL DATA:  Shortness of breath and cough.  EXAM: CHEST - 2 VIEW  COMPARISON:  No prior chest x-rays. Comparison is made to CT appearance of the lung bases on prior CT studies from 07/30/2014 and 02/18/2012.  FINDINGS: There is a component of diffuse bilateral chronic lung disease which is fibrotic in nature. Lung bases on prior abdomen CT shows similar changes. There may be component of acute infiltrate, especially in the right upper  lung. No pulmonary edema, pleural effusions or pneumothorax identified. The anterior right hemidiaphragm is mildly elevated. The heart size and mediastinal contours are within normal limits. The bony thorax shows degenerative disease of the thoracic spine with associated mild rightward convex scoliosis.  IMPRESSION: Diffuse chronic lung disease which most likely represents pulmonary fibrosis. Without prior chest x-rays, it it would be difficult to exclude acute infiltrate, especially in the right upper lobe.   Electronically Signed   By: Aletta Edouard M.D.   On: 08/15/2014 15:11   Ct Angio Chest W/cm &/or Wo Cm  08/15/2014   CLINICAL DATA:  Chronic cough. Shortness of breath aunt exertion. Abnormal chest x-ray earlier same date. Prior right mastectomy for breast cancer.  EXAM: CT ANGIOGRAPHY CHEST WITH CONTRAST  TECHNIQUE: Multidetector CT imaging of the chest was performed using the standard protocol during bolus administration of intravenous  contrast. Multiplanar CT image reconstructions and MIPs were obtained to evaluate the vascular anatomy.  CONTRAST:  111mL OMNIPAQUE IOHEXOL 350 MG/ML IV.  COMPARISON:  No prior CT.  Two-view chest x-ray earlier same date.  FINDINGS: Contrast opacification of the pulmonary arteries is very good. No filling defects within either main pulmonary artery or their branches in either lung to suggest pulmonary embolism. Heart enlarged. Mitral annular calcification. Moderate 3 vessel coronary atherosclerosis. No pericardial effusion. Moderate to severe atherosclerosis involving the thoracic and upper abdominal aorta without aneurysm or dissection.  Severe interstitial lung disease and associated ground-glass opacity throughout both lungs, with involvement of both upper lobes and both lower lobes. Associated bronchiectasis in the right upper lobe. Biapical pleuroparenchymal scarring, right greater than left. No pulmonary parenchymal nodules or masses. No pleural effusions. Central airways patent with calcified tracheobronchial cartilages.  Normal size lymph nodes in the me mediastinum and in both hila. No significant lymphadenopathy. Prior right mastectomy. Thyroid gland unremarkable.  Large simple cyst in the anterior segment right lobe of liver beneath the dome, maximum measurements approximating 7.1 x 8.3 x 6.8 cm. Visualized upper abdomen otherwise unremarkable. Bone window images demonstrate diffuse thoracic spondylosis and slight exaggeration of the usual thoracic kyphosis.  Review of the MIP images confirms the above findings.  IMPRESSION: 1. No evidence of pulmonary embolism. 2. Cardiomegaly.  Moderate 3 vessel coronary atherosclerosis. 3. Severe interstitial lung disease involving both upper lobes and both lower lobes, with associated ground-glass opacity. Given the history of chronic cough, I suspect this represents the acute phase of usual interstitial pneumonitis (UIP)/pulmonary fibrosis. 4. Approximate 8 cm simple  cyst in the visualized liver.   Electronically Signed   By: Evangeline Dakin M.D.   On: 08/15/2014 17:43   Portable Chest 1 View  08/16/2014   CLINICAL DATA:  Chronic interstitial lung disease ; acute onset dyspnea with exertion  EXAM: PORTABLE CHEST - 1 VIEW  COMPARISON:  August 15, 2014 chest radiograph and chest CT  FINDINGS: Widespread reticular type interstitial disease/pulmonary fibrosis remains without change. There is stable asymmetric apical pleural thickening on the right compared to the left. No new opacity. Heart size and pulmonary vascularity are overall within normal limits. No adenopathy apparent. There is upper thoracic dextroscoliosis. There is atherosclerotic change in aorta.  IMPRESSION: Stable chronic interstitial lung disease/ pulmonary fibrosis. No new opacity to suggest superimposed edema or consolidation. Subtle acute pneumonia could easily be obscured given the degree of underlying interstitial disease, however.   Electronically Signed   By: Lowella Grip M.D.   On: 08/16/2014 08:22   Ecg: NSR with poor R  wave progression  Echo: 08/16/14:Study Conclusions  - Left ventricle: The cavity size was normal. Wall thickness was increased in a pattern of mild LVH. Systolic function was normal. The estimated ejection fraction was in the range of 60% to 65%. Cannot exclude hypokinesis of the basal inferior myocardium, but could be anatomical portion of annulus. Doppler parameters are consistent with abnormal left ventricular relaxation (grade 1 diastolic dysfunction). Doppler parameters are consistent with elevated ventricular end-diastolic filling pressure. - Aortic valve: Trileaflet; mildly calcified leaflets. There was mild to moderate regurgitation. Closer to mild with vena contracta 3 mm. Regurgitation pressure half-time: 431 ms. - Mitral valve: Calcified annulus. There was trivial regurgitation. - Left atrium: The atrium was mildly dilated. - Pulmonary arteries:  Systolic pressure could not be accurately estimated. - Inferior vena cava: Not well visualized. Unable to estimate CVP accurately. - Pericardium, extracardiac: There was no pericardial effusion.  Impressions:  - Mild LVH with LVEF 03-50%, grade 1 diastolic dysfunction with increased filling pressures. Cannot exclude hypokinesis of the basal inferior myocardium, but could be anatomical portion of annulus. Mild left atrial enlargement. Mild to moderate aortic regurgitation. Unable to assess PASP, trivial tricuspid regurgitation.    PHYSICAL EXAM General: Well developed, well nourished, in no acute distress. Head: Normocephalic, atraumatic, sclera non-icteric, oropharynx is clear Neck: Negative for carotid bruits. JVD nis mildly elevated. No adenopathy Lungs: Fine crackles at the bases. Heart: RRR S1 S2 without murmurs, rubs, or gallops.  Abdomen: Soft, non-tender, non-distended with normoactive bowel sounds. No hepatomegaly. No rebound/guarding. No obvious abdominal masses. Msk:  Strength and tone appears normal for age. Extremities: No clubbing, cyanosis or edema.  Distal pedal pulses are 2+ and equal bilaterally. Neuro: Alert and oriented X 3. Moves all extremities spontaneously. Psych:  Responds to questions appropriately with a normal affect.  ASSESSMENT AND PLAN: 1. Acute diastolic CHF mild. Agree with gentle diuresis. On lasix 40 mg po daily. Monitor renal indices.  2. CAD as noted on CT. No anginal symptoms. I would not pursue ischemic evaluation at this time. 3. Interstitial lung disease with pulmonary fibrosis. This appears to be the major issue. Continue antibiotics and steroids per pulmonary.  4. HLD. Lipid status is unknown. With evidence of CAD by CT she should be on statin therapy. Will check lipid panel and start lipitor.  Present on Admission:  . Dyspnea . Postinflammatory pulmonary fibrosis . Acute respiratory failure with hypoxemia  Signed, Sapphira Harjo [place],  [place] 08/17/2014 8:06 AM

## 2014-08-17 NOTE — Care Management Note (Addendum)
    Page 1 of 2   08/18/2014     2:53:13 PM CARE MANAGEMENT NOTE 08/18/2014  Patient:  [name], [name]   Account Number:  [id number]  Date Initiated:  08/17/2014  Documentation initiated by:  [facility]  Subjective/Objective Assessment:   80 Y/O F ADMITTED W/SOB.     Action/Plan:   FROM HOME.   Anticipated DC Date:  08/19/2014   Anticipated DC Plan:  [place]  CM consult      Choice offered to / List presented to:  C-1 Patient        [place] arranged  HH-1 RN  [place]   Status of service:  In process, will continue to follow Medicare Important Message given?  YES (If response is "NO", the following Medicare IM given date fields will be blank) Date Medicare IM given:  08/18/2014 Medicare IM given by:  [facility] Date Additional Medicare IM given:   Additional Medicare IM given by:    Discharge Disposition:    Per UR Regulation:  Reviewed for med. necessity/level of care/duration of stay  If discussed at [place] of Stay Meetings, dates discussed:    Comments:  08/18/14 Marnesha Gagen RN,BSN NCM [phone] 2:50P-DOES NOT [place].MD HAS ALREADY PLACED HOME 02 ORDER,& HOME RESPIRATORY CARE-PLEASE CANCEL (NURSE NOTIFIED).AWAIT HOME ROLLING WALKER ORDER.  RECEIVED HHC ORDERS.[facility] REP [place] DO NOT PROVIDE RESPIRATORY CARE.[facility] DME REP [place] 02 ORDER-(THEY WILL PROVIED RESPIRATORY CARE) BUT STILL AWAITING 02 SATS TO BE DOCUMENTED SPOKE TO NURSE DAVID AWARE.WILL NEED HOME ROLLING WALKER ORDER.SEE PHYSICIAN STICKY NOTE.  08/17/14 Megan Hayduk RN,BSN NCM [phone] 3:30P-[facility] CHOSEN IF HHC ORDERED.TC [facility] REP [place].RECOMMEND HHPT, RW. NOTE 02 SATS.IF HOME 02 NEEDED CAN PROVIDE.PROVIDED [facility] AGENCY LIST.AWAIT CHOICE.

## 2014-08-17 NOTE — Progress Notes (Signed)
Occupational Therapy Treatment Patient Details Name: ANIYLAH AVANS MRN: [id number] DOB: May 11, 1928 Today's Date: 08/17/2014    History of present illness Moana B. Siedlecki is an 80 y.o. Female admitted 08/15/14 with SOB. PMH of intertitial lung disease not requiring oxygen, chronic edema, and GI issues. Pt reports increasing fatigue and weakness over the past few weeks.     OT comments  Pt fatigues easily and is pondering ST SNF  Follow Up Recommendations  Home health OT;Supervision/Assistance - 24 hour;SNF    Equipment Recommendations  None recommended by OT       Precautions / Restrictions Precautions Precautions: Fall Precaution Comments: monitor O2 Restrictions Weight Bearing Restrictions: No       Mobility Bed Mobility Overal bed mobility: Modified Independent                Transfers Overall transfer level: Needs assistance Equipment used: 1 person hand held assist Transfers: Sit to/from Stand;Stand Pivot Transfers Sit to Stand: Min guard Stand pivot transfers: Min guard                ADL Overall ADL's : Needs assistance/impaired Eating/Feeding: Sitting;Modified independent   Grooming: Standing;Supervision/safety       Lower Body Bathing: Minimal assistance;Sit to/from stand       Lower Body Dressing: Minimal assistance;Sit to/from stand   Toilet Transfer: Minimal assistance;BSC   Toileting- Clothing Manipulation and Hygiene: Minimal assistance;Sit to/from stand       Functional mobility during ADLs: Minimal assistance General ADL Comments: Pt fatigues very quickly. Pt cnosidering ST SNF as she feels very weak.  Explained pt could [facility] in [facility] rehab - then [facility] rehab.  Pt considering                Cognition   Behavior During Therapy: WFL for tasks assessed/performed Overall Cognitive Status: Within Functional Limits for tasks assessed                               General Comments  Lengthy discussion  regarding ADL activity and how quickly py fatigued with standing activity .     Pertinent Vitals/ Pain       Pain Assessment: No/denies pain     Prior Functioning/Environment              Frequency Min 2X/week     Progress Toward Goals  OT Goals(current goals can now be found in the care plan section)  Progress towards OT goals: Progressing toward goals     Plan Discharge plan needs to be updated       End of Session Equipment Utilized During Treatment: Oxygen   Activity Tolerance Patient limited by fatigue   Patient Left in bed;with call bell/phone within reach           Time: 1259-1325 OT Time Calculation (min): 26 min  Charges: OT General Charges $OT Visit: 1 Procedure OT Treatments $Self Care/Home Management : 23-37 mins  Che Below, Thereasa Parkin 08/17/2014, 1:28 PM

## 2014-08-17 NOTE — Progress Notes (Signed)
Clinical Social Work  CSW received referral to assist with SNF placement. Per chart review, PT recommending HH at [place] is signing off but available if further needs arise.  [place], [place] [phone]

## 2014-08-18 ENCOUNTER — Inpatient Hospital Stay (HOSPITAL_COMMUNITY): Payer: Medicare Other

## 2014-08-18 DIAGNOSIS — R768 Other specified abnormal immunological findings in serum: Secondary | ICD-10-CM

## 2014-08-18 LAB — LIPID PANEL
Cholesterol: 165 mg/dL (ref 0–200)
HDL: 36 mg/dL — ABNORMAL LOW (ref >39)
LDL Cholesterol: 110 mg/dL — ABNORMAL HIGH (ref 0–99)
TRIGLYCERIDES: 95 mg/dL (ref <150)
Total CHOL/HDL Ratio: 4.6 RATIO
VLDL: 19 mg/dL (ref 0–40)

## 2014-08-18 LAB — SJOGRENS SYNDROME-A EXTRACTABLE NUCLEAR ANTIBODY: SSA (Ro) (ENA) Antibody, IgG: <1

## 2014-08-18 LAB — SJOGRENS SYNDROME-B EXTRACTABLE NUCLEAR ANTIBODY: SSB (La) (ENA) Antibody, IgG: <1

## 2014-08-18 LAB — CYCLIC CITRUL PEPTIDE ANTIBODY, IGG: Cyclic Citrullin Peptide Ab: <2 U/mL (ref 0.0–5.0)

## 2014-08-18 LAB — ANA: Anti Nuclear Antibody(ANA): NEGATIVE

## 2014-08-18 LAB — ANTI-SCLERODERMA ANTIBODY: Scleroderma (Scl-70) (ENA) Antibody, IgG: <1

## 2014-08-18 MED ORDER — AZITHROMYCIN 500 MG PO TABS
500.0000 mg | ORAL_TABLET | ORAL | Status: DC
  Administered 2014-08-18 – 2014-08-19 (×2): 500 mg via ORAL
  Filled 2014-08-18 (×3): qty 1

## 2014-08-18 MED ORDER — LOSARTAN POTASSIUM 50 MG PO TABS
100.0000 mg | ORAL_TABLET | Freq: Every day | ORAL | Status: DC
  Administered 2014-08-18 – 2014-08-20 (×3): 100 mg via ORAL
  Filled 2014-08-18 (×3): qty 2

## 2014-08-18 MED ORDER — SALINE SPRAY 0.65 % NA SOLN
1.0000 | Freq: Two times a day (BID) | NASAL | Status: DC
  Administered 2014-08-18 – 2014-08-20 (×4): 1 via NASAL
  Filled 2014-08-18: qty 44

## 2014-08-18 MED ORDER — METHYLPREDNISOLONE SODIUM SUCC 40 MG IJ SOLR
40.0000 mg | Freq: Two times a day (BID) | INTRAMUSCULAR | Status: DC
  Administered 2014-08-18: 40 mg via INTRAVENOUS
  Filled 2014-08-18 (×3): qty 1

## 2014-08-18 MED ORDER — FLUTICASONE PROPIONATE 50 MCG/ACT NA SUSP
2.0000 | Freq: Every day | NASAL | Status: DC
  Administered 2014-08-18 – 2014-08-20 (×3): 2 via NASAL
  Filled 2014-08-18: qty 16

## 2014-08-18 NOTE — Progress Notes (Signed)
Subjective: She reports slight improvement in energy and breathing.  Continues to have thick mucous and cough.  Able to ambulate to BR.  Objective: Vital signs in last 24 hours: Temp:  [97.3 F (36.3 C)-97.7 F (36.5 C)] 97.3 F (36.3 C) (10/13 0556) Pulse Rate:  [72-98] 72 (10/13 0556) Resp:  [18] 18 (10/13 0556) BP: (152-177)/(59-80) 167/80 mmHg (10/13 0556) SpO2:  [96 %-98 %] 98 % (10/13 0556) Weight change:  Last BM Date: 08/17/14  CBG (last 3)  No results found for this basename: GLUCAP,  in the last 72 hours  Intake/Output from previous day: 10/12 0701 - 10/13 0700 In: 480 [P.O.:480] Out: 850 [Urine:850] Intake/Output this shift:    General appearance: alert and no distress Eyes: no scleral icterus Throat: oropharynx moist without erythema Resp: bilateral crackles, cough with deep breathing Cardio: regular rate and rhythm GI: soft, non-tender; bowel sounds normal; no masses,  no organomegaly Extremities: no clubbing, cyanosis or edema; no active synovitis   Lab Results:  Recent Labs  08/16/14 0334 08/17/14 0515  NA 134* 131*  K 4.2 4.3  CL 97 94*  CO2 24 25  GLUCOSE 161* 160*  BUN 11 15  CREATININE 0.60 0.56  CALCIUM 9.9 10.4    Recent Labs  08/16/14 0334 08/17/14 0515  AST 32 24  ALT 38* 30  ALKPHOS 130* 118*  BILITOT 0.6 0.4  PROT 7.0 7.1  ALBUMIN 2.8* 3.0*    Recent Labs  08/15/14 1456 08/16/14 0334 08/17/14 0515  WBC 10.5 7.6 18.8*  NEUTROABS 7.4  --  16.6*  HGB 12.5 13.0 13.0  HCT 36.8 37.6 36.9  MCV 89.3 87.6 88.3  PLT 417* 441* 506*    Recent Labs  08/15/14 2135 08/16/14 0237 08/16/14 0920  TROPONINI <0.30 <0.30 <0.30    Recent Labs  08/16/14 0336  TSH 0.589   Rheumatoid Factor- positive (31) Sed Rate- 67  Studies/Results: Pulmonary Function Tests (10/12): FEV1/FVC 84% TLC 73% predicted DLco cor 46% predicted   Medications: Scheduled: . aspirin EC  325 mg Oral Daily  . atorvastatin  20 mg Oral q1800  .  azithromycin  500 mg Intravenous Q24H  . cefTRIAXone (ROCEPHIN)  IV  1 g Intravenous Q24H  . docusate sodium  100 mg Oral BID  . enoxaparin (LOVENOX) injection  40 mg Subcutaneous Q24H  . feeding supplement (ENSURE)  1 Container Oral BID BM  . furosemide  40 mg Oral Daily  . guaiFENesin  600 mg Oral BID  . losartan  100 mg Oral Daily  . methylPREDNISolone (SOLU-MEDROL) injection  80 mg Intravenous BID  . pantoprazole  40 mg Oral BID AC  . sertraline  100 mg Oral Daily  . sodium chloride  3 mL Intravenous Q12H   Continuous:   Assessment/Plan: 1. Acute on Chronic respiratory failure with hypoxemia- suspect progressive pulmonary fibrosis. ILD first noted on CXR and with decreased DLCO in 5/11 with initial visit with Dr. Melvyn Novas in 1/12- since then symptoms have slowly progressed and have recently reached a critical point with worsening dyspnea, cough and generalized weakness. Continue empiric steroids (changed to IV per Dr. Melvyn Novas), antibiotics and O2. PFTs confirm restrictive lung disease with decreased DLco.  Rheumatoid Factor positive (previously known) and elevated sed rate may be related.   Anti-Scl, SSA, SSB, ANCA, ANA, Aldolase pending. 2. Postinflammatory pulmonary fibrosis- will arrange home O2. Consider Pulmonary Rehab. Wean steroids to oral (dose per pulmonology) 3. Acute Diastolic CHF- EF 38-18%. Will continue oral lasix and  ARB. Avoid BBlocker due to underlying lung disease.  4. Rheumatoid Factor positive- CCP antibodies pending.  Will obtain Hand X-rays to evaluate for erosions and consider outpatient Rheumatology evaluation if present or +CCP.  ?related to ILD. 5. CAD- started on statin therapy (fasting lipids pending). Low suspicion of ischemia as cause of dyspnea.  6. Steroid induced leukocytosis- recheck in am. On empiric antibiotics but low suspicion of pneumonia.  7. Protein-calorie malnutrition- continue Ensure  8. Disposition- continue PT/OT-  Anticipate discharge tomorrow if  able to transition to oral steroids per pulmonology.     LOS: 3 days   [name] 08/18/2014, 7:04 AM

## 2014-08-18 NOTE — Progress Notes (Signed)
SATURATION QUALIFICATIONS: (This note is used to comply with regulatory documentation for home oxygen)  Patient Saturations on Room Air at Rest = 95%  Patient Saturations on Room Air while Ambulating = 93%   

## 2014-08-18 NOTE — Progress Notes (Signed)
PHARMACIST - PHYSICIAN COMMUNICATION DR:   Brigitte Pulse CONCERNING: Antibiotic IV to Oral Route Change Policy  RECOMMENDATION: This patient is receiving Azithromycin by the intravenous route.  Based on criteria approved by the Pharmacy and Therapeutics Committee, the antibiotic(s) is/are being converted to the equivalent oral dose form(s).   DESCRIPTION: These criteria include:  Patient being treated for a respiratory tract infection, urinary tract infection, cellulitis or clostridium difficile associated diarrhea if on metronidazole  The patient is not neutropenic and does not exhibit a GI malabsorption state  The patient is eating (either orally or via tube) and/or has been taking other orally administered medications for a least 24 hours  The patient is improving clinically and has a Tmax < 100.5  If you have questions about this conversion, please contact the Pharmacy Department  []   [phone] )  [facility] []   [phone] )  [facility]  []   [phone] )  [facility] []   [phone] )  [facility]   Thank you

## 2014-08-18 NOTE — Progress Notes (Signed)
PULMONARY / CRITICAL CARE MEDICINE   Name: [name] MRN: [id number] DOB: [birth year]    ADMISSION DATE:  08/15/2014 CONSULTATION DATE:  08/16/14   REFERRING MD : Osborne Casco for Dr Lang Snow  CHIEF COMPLAINT:  sob  INITIAL PRESENTATION:  80 yo female never smoker with hx of pulmonary fibrosis since at least April 2013 (?cause) presented with acute on chronic dyspnea associated with recent onset of rhinitis/post-nasal drip, productive cough, and weakness.  She has been followed previously by Dr. Melvyn Novas.  STUDIES:  PFT's 03/10/10 FEV1 1.68 (130%) ratio 74 , DLCO 48% (vs 45% 04/22/07) corrects to 60%  CT 08/15/14 >> moderate CAD, UIP pattern of pulmonary fibrosis with b/l areas of GGO, BTX RUL ESR 08/16/14  >> 67 Echo 08/16/14 >> mild LVH, EF 60 to 84%, diastolic dysfx Labs 03/75/43 >> ANA negative, RF 32,  PFT 08/17/14 >> FEV1 1.75 (115%), FEV1% 84, TLC 3.39 (73%), DLCO 45%  SIGNIFICANT EVENTS: Empiric steroids started pm 08/16/14   SUBJECTIVE:  Breathing and cough better.  Not as much sputum.  C/o nasal congestion with post nasal drip >> uses flonase as  outpt.  VITAL SIGNS: Temp:  [97.3 F (36.3 C)-97.7 F (36.5 C)] 97.3 F (36.3 C) (10/13 0556) Pulse Rate:  [72-98] 72 (10/13 0556) Resp:  [18] 18 (10/13 0556) BP: (152-177)/(59-80) 167/80 mmHg (10/13 0556) SpO2:  [96 %-98 %] 98 % (10/13 0556) INTAKE / OUTPUT:  Intake/Output Summary (Last 24 hours) at 08/18/14 1138 Last data filed at 08/18/14 0641  Gross per 24 hour  Intake    480 ml  Output    650 ml  Net   -170 ml    PHYSICAL EXAMINATION: General: no distress HEENT: clear nasal drainage Cardiac: regular, no murmur Chest: scattered rhonchi, better air movement Abd: soft, non tender Ext: no joint swelling Skin: no rashes  LABS:  CBC  Recent Labs Lab 08/15/14 1456 08/16/14 0334 08/17/14 0515  WBC 10.5 7.6 18.8*  HGB 12.5 13.0 13.0  HCT 36.8 37.6 36.9  PLT 417* 441* 506*   BMET  Recent Labs Lab  08/15/14 1456 08/16/14 0334 08/17/14 0515  NA 130* 134* 131*  K 4.5 4.2 4.3  CL 94* 97 94*  CO2 '22 24 25  ' BUN '15 11 15  ' CREATININE 0.72 0.60 0.56  GLUCOSE 115* 161* 160*   Electrolytes  Recent Labs Lab 08/15/14 1456 08/16/14 0334 08/17/14 0515  CALCIUM 9.9 9.9 10.4   Liver Enzymes  Recent Labs Lab 08/15/14 1456 08/16/14 0334 08/17/14 0515  AST 51* 32 24  ALT 47* 38* 30  ALKPHOS 144* 130* 118*  BILITOT 0.6 0.6 0.4  ALBUMIN 3.0* 2.8* 3.0*   Cardiac Enzymes  Recent Labs Lab 08/15/14 1457 08/15/14 2135 08/16/14 0237 08/16/14 0336 08/16/14 0920 08/17/14 0515  TROPONINI  --  <0.30 <0.30  --  <0.30  --   PROBNP 1859.0*  --   --  1717.0*  --  802.7*   Imaging Dg Chest 2 View  08/18/2014   CLINICAL DATA:  Shortness of breath, cough.  EXAM: CHEST  2 VIEW  COMPARISON:  August 16, 2014.  FINDINGS: Cardiomediastinal silhouette appears normal. Stable interstitial densities are noted throughout both lungs, consistent with pulmonary fibrosis or chronic interstitial lung disease. Stable right apical scarring is noted. Degenerative change of right glenohumeral joint is noted. No pneumothorax or pleural effusion is noted.  IMPRESSION: Stable interstitial densities are noted throughout both lungs consistent with scarring or fibrosis. No significant changes noted compared  to prior exam.   Electronically Signed   By: Sabino Dick M.D.   On: 08/18/2014 10:55   Dg Hand Complete Left  08/18/2014   CLINICAL DATA:  Rheumatoid factor positive.  Left hand pain.  EXAM: LEFT HAND - COMPLETE 3+ VIEW  COMPARISON:  03/02/2006  FINDINGS: Mild osteoarthritic changes within the DIP joints and third through fifth PIP joints. MCP joints are relatively maintained. No bony erosions. Normal bone mineralization. No fracture, subluxation or dislocation.  IMPRESSION: Mild osteoarthritis in the IP joints.  Otherwise unremarkable.   Electronically Signed   By: Rolm Baptise M.D.   On: 08/18/2014 11:05   Dg  Hand Complete Right  08/18/2014   CLINICAL DATA:  Rheumatoid factor positive.  Right hand pain.  EXAM: RIGHT HAND - COMPLETE 3+ VIEW  COMPARISON:  03/02/2006  FINDINGS: Advanced osteoarthritic changes in the DIP joints, most pronounced in the third through fifth digits and the fourth PIP joint. Mild degenerative changes throughout the remainder of the IP joints. Mild joint space narrowing within the second MCP joint. No bony erosions. Normal bone mineralization. No fracture, subluxation or dislocation.  IMPRESSION: No acute bony abnormality.  Degenerative changes in the IP joints. Mild nonspecific joint space narrowing in the right second MCP joint without bony erosions.   Electronically Signed   By: Rolm Baptise M.D.   On: 08/18/2014 11:07    ASSESSMENT / PLAN:  Acute hypoxic respiratory failure 2nd to GGO pulmonary infiltrates >> likely combination of acute pneumonitis in setting of undefined ILD with UIP pattern, acute diastolic heart failure, and possible viral pneumonitis.  She has hx of "arthritis", and reports family hx of "fungus" exposure that caused lung scarring in her brother.  Acute hypoxic respiratory failure. Plan: Oxygen to keep SpO2 > 92% >> likely will need home oxygen set up  Undefined ILD with UIP pattern on CT chest, and possible acute pneumonitis with GGO changes.  Has elevated ESR, and mild elevation of RF (31 IU/ml) from 10/12. Plan: Change solumedrol to 40 mg q12h on 10/13 F/u anti CCP, ANCA, Scl 70, aldolase, SS a, SS b F/u CXR intermittently  ?acute respiratory infection with possible viral pneumonitis. Plan: Symptomatic management Day 4 of rocephin, zithromax per primary team, started 10/10  Rhinitis with post-nasal drip. Plan: Flonase Nasal saline spray  Acute diastolic heart failure. Plan: Per primary team and cardiology  Cough. Plan: Prn mucinex, tramadol  Chesley Mires, MD [place] 08/18/2014, 11:38 AM Pager:   [phone] After 3pm call: [phone]

## 2014-08-18 NOTE — Progress Notes (Signed)
TELEMETRY: Reviewed telemetry pt in NSR: Filed Vitals:   08/17/14 0530 08/17/14 1445 08/17/14 2119 08/18/14 0556  BP: 163/69 152/59 177/64 167/80  Pulse: 85 86 98 72  Temp: 97.9 F (36.6 C) 97.5 F (36.4 C) 97.7 F (36.5 C) 97.3 F (36.3 C)  TempSrc: Oral Oral Oral Oral  Resp: 18 18 18 18   Height:      Weight:      SpO2: 95% 98% 96% 98%    Intake/Output Summary (Last 24 hours) at 08/18/14 0635 Last data filed at 08/17/14 1800  Gross per 24 hour  Intake    480 ml  Output    200 ml  Net    280 ml   Filed Weights   08/15/14 2026  Weight: 159 lb 2.8 oz (72.2 kg)    Subjective States breathing is improved. Coughing with some production. No chest pain.  Marland Kitchen aspirin EC  325 mg Oral Daily  . atorvastatin  20 mg Oral q1800  . azithromycin  500 mg Intravenous Q24H  . cefTRIAXone (ROCEPHIN)  IV  1 g Intravenous Q24H  . docusate sodium  100 mg Oral BID  . enoxaparin (LOVENOX) injection  40 mg Subcutaneous Q24H  . feeding supplement (ENSURE)  1 Container Oral BID BM  . furosemide  40 mg Oral Daily  . guaiFENesin  600 mg Oral BID  . losartan  100 mg Oral Daily  . methylPREDNISolone (SOLU-MEDROL) injection  80 mg Intravenous BID  . pantoprazole  40 mg Oral BID AC  . sertraline  100 mg Oral Daily  . sodium chloride  3 mL Intravenous Q12H      LABS: Basic Metabolic Panel:  Recent Labs  08/16/14 0334 08/17/14 0515  NA 134* 131*  K 4.2 4.3  CL 97 94*  CO2 24 25  GLUCOSE 161* 160*  BUN 11 15  CREATININE 0.60 0.56  CALCIUM 9.9 10.4   Liver Function Tests:  Recent Labs  08/16/14 0334 08/17/14 0515  AST 32 24  ALT 38* 30  ALKPHOS 130* 118*  BILITOT 0.6 0.4  PROT 7.0 7.1  ALBUMIN 2.8* 3.0*   No results found for this basename: LIPASE, AMYLASE,  in the last 72 hours CBC:  Recent Labs  08/15/14 1456 08/16/14 0334 08/17/14 0515  WBC 10.5 7.6 18.8*  NEUTROABS 7.4  --  16.6*  HGB 12.5 13.0 13.0  HCT 36.8 37.6 36.9  MCV 89.3 87.6 88.3  PLT 417* 441* 506*    Cardiac Enzymes:  Recent Labs  08/15/14 2135 08/16/14 0237 08/16/14 0920  TROPONINI <0.30 <0.30 <0.30   BNP:  Recent Labs  08/15/14 1457 08/16/14 0336 08/17/14 0515  PROBNP 1859.0* 1717.0* 802.7*   D-Dimer: No results found for this basename: DDIMER,  in the last 72 hours Hemoglobin A1C: No results found for this basename: HGBA1C,  in the last 72 hours Fasting Lipid Panel: No results found for this basename: CHOL, HDL, LDLCALC, TRIG, CHOLHDL, LDLDIRECT,  in the last 72 hours Thyroid Function Tests:  Recent Labs  08/16/14 0336  TSH 0.589     Radiology/Studies:  No results found. Ecg: NSR with poor R wave progression  Echo: 08/16/14:Study Conclusions  - Left ventricle: The cavity size was normal. Wall thickness was increased in a pattern of mild LVH. Systolic function was normal. The estimated ejection fraction was in the range of 60% to 65%. Cannot exclude hypokinesis of the basal inferior myocardium, but could be anatomical portion of annulus. Doppler parameters are consistent  with abnormal left ventricular relaxation (grade 1 diastolic dysfunction). Doppler parameters are consistent with elevated ventricular end-diastolic filling pressure. - Aortic valve: Trileaflet; mildly calcified leaflets. There was mild to moderate regurgitation. Closer to mild with vena contracta 3 mm. Regurgitation pressure half-time: 431 ms. - Mitral valve: Calcified annulus. There was trivial regurgitation. - Left atrium: The atrium was mildly dilated. - Pulmonary arteries: Systolic pressure could not be accurately estimated. - Inferior vena cava: Not well visualized. Unable to estimate CVP accurately. - Pericardium, extracardiac: There was no pericardial effusion.  Impressions:  - Mild LVH with LVEF 78-29%, grade 1 diastolic dysfunction with increased filling pressures. Cannot exclude hypokinesis of the basal inferior myocardium, but could be anatomical portion of annulus.  Mild left atrial enlargement. Mild to moderate aortic regurgitation. Unable to assess PASP, trivial tricuspid regurgitation.    PHYSICAL EXAM General: Well developed, well nourished, in no acute distress. Head: Normocephalic, atraumatic, sclera non-icteric, oropharynx is clear Neck: Negative for carotid bruits. JVD nis mildly elevated. No adenopathy Lungs: Fine crackles bilatera bases. Heart: RRR S1 S2 without murmurs, rubs, or gallops.  Abdomen: Soft, non-tender, non-distended with normoactive bowel sounds. No hepatomegaly. No rebound/guarding. No obvious abdominal masses. Msk:  Strength and tone appears normal for age. Extremities: No clubbing, cyanosis or edema.  Distal pedal pulses are 2+ and equal bilaterally. Neuro: Alert and oriented X 3. Moves all extremities spontaneously. Psych:  Responds to questions appropriately with a normal affect.  ASSESSMENT AND PLAN: 1. Acute diastolic CHF mild. Agree with gentle diuresis. On lasix 40 mg po daily. Renal function stable.  2. CAD as noted on CT. No anginal symptoms. I would not pursue ischemic evaluation at this time. 3. Interstitial lung disease with pulmonary fibrosis. This appears to be the major issue. Continue antibiotics and steroids per pulmonary.  4. HLD. Lipid status is unknown. With evidence of CAD by CT she should be on statin therapy. Will check lipid panel and start lipitor. 5. HTN- poorly controlled. Will increase losartan to 100 mg daily.  Present on Admission:  . Dyspnea . Postinflammatory pulmonary fibrosis . Acute respiratory failure with hypoxemia . Diastolic dysfunction with acute on chronic heart failure . Coronary artery disease . Protein-calorie malnutrition  Signed, Peter Martin[place], [place] 08/18/2014 6:35 AM

## 2014-08-19 ENCOUNTER — Encounter: Payer: Self-pay | Admitting: Internal Medicine

## 2014-08-19 LAB — CBC
HEMATOCRIT: 37.6 % (ref 36.0–46.0)
HEMOGLOBIN: 12.3 g/dL (ref 12.0–15.0)
MCH: 29.4 pg (ref 26.0–34.0)
MCHC: 32.7 g/dL (ref 30.0–36.0)
MCV: 90 fL (ref 78.0–100.0)
Platelets: 456 10*3/uL — ABNORMAL HIGH (ref 150–400)
RBC: 4.18 MIL/uL (ref 3.87–5.11)
RDW: 14.3 % (ref 11.5–15.5)
WBC: 13.4 10*3/uL — AB (ref 4.0–10.5)

## 2014-08-19 LAB — ANCA SCREEN W REFLEX TITER
Atypical p-ANCA Screen: NEGATIVE
c-ANCA Screen: NEGATIVE
p-ANCA Screen: NEGATIVE

## 2014-08-19 LAB — BASIC METABOLIC PANEL
Anion gap: 10 (ref 5–15)
BUN: 21 mg/dL (ref 6–23)
CO2: 27 meq/L (ref 19–32)
Calcium: 9.8 mg/dL (ref 8.4–10.5)
Chloride: 96 mEq/L (ref 96–112)
Creatinine, Ser: 0.69 mg/dL (ref 0.50–1.10)
GFR calc non Af Amer: 78 mL/min — ABNORMAL LOW (ref >90)
Glucose, Bld: 159 mg/dL — ABNORMAL HIGH (ref 70–99)
POTASSIUM: 4.6 meq/L (ref 3.7–5.3)
Sodium: 133 mEq/L — ABNORMAL LOW (ref 137–147)

## 2014-08-19 MED ORDER — PREDNISONE 20 MG PO TABS
40.0000 mg | ORAL_TABLET | Freq: Every day | ORAL | Status: DC
  Administered 2014-08-19 – 2014-08-20 (×2): 40 mg via ORAL
  Filled 2014-08-19 (×4): qty 2

## 2014-08-19 NOTE — Progress Notes (Signed)
TELEMETRY: Reviewed telemetry pt in NSR: Filed Vitals:   08/18/14 1429 08/18/14 1437 08/18/14 2143 08/19/14 0537  BP: 154/61  147/56 166/70  Pulse: 93  75 75  Temp: 97.7 F (36.5 C)  98.1 F (36.7 C) 97.4 F (36.3 C)  TempSrc: Oral  Oral Oral  Resp: 18  18 18   Height:      Weight:      SpO2: 95% 93% 90% 92%    Intake/Output Summary (Last 24 hours) at 08/19/14 0729 Last data filed at 08/18/14 2328  Gross per 24 hour  Intake    480 ml  Output   2350 ml  Net  -1870 ml   Filed Weights   08/15/14 2026  Weight: 159 lb 2.8 oz (72.2 kg)    Subjective States breathing is improved. Coughing with some production. No chest pain.  Marland Kitchen aspirin EC  325 mg Oral Daily  . atorvastatin  20 mg Oral q1800  . azithromycin  500 mg Oral Q24H  . cefTRIAXone (ROCEPHIN)  IV  1 g Intravenous Q24H  . docusate sodium  100 mg Oral BID  . enoxaparin (LOVENOX) injection  40 mg Subcutaneous Q24H  . feeding supplement (ENSURE)  1 Container Oral BID BM  . fluticasone  2 spray Each Nare Daily  . furosemide  40 mg Oral Daily  . guaiFENesin  600 mg Oral BID  . losartan  100 mg Oral Daily  . methylPREDNISolone (SOLU-MEDROL) injection  40 mg Intravenous BID  . pantoprazole  40 mg Oral BID AC  . sertraline  100 mg Oral Daily  . sodium chloride  1 spray Each Nare BID  . sodium chloride  3 mL Intravenous Q12H      LABS: Basic Metabolic Panel:  Recent Labs  08/17/14 0515 08/19/14 0433  NA 131* 133*  K 4.3 4.6  CL 94* 96  CO2 25 27  GLUCOSE 160* 159*  BUN 15 21  CREATININE 0.56 0.69  CALCIUM 10.4 9.8   Liver Function Tests:  Recent Labs  08/17/14 0515  AST 24  ALT 30  ALKPHOS 118*  BILITOT 0.4  PROT 7.1  ALBUMIN 3.0*   No results found for this basename: LIPASE, AMYLASE,  in the last 72 hours CBC:  Recent Labs  08/17/14 0515 08/19/14 0433  WBC 18.8* 13.4*  NEUTROABS 16.6*  --   HGB 13.0 12.3  HCT 36.9 37.6  MCV 88.3 90.0  PLT 506* 456*   Cardiac Enzymes:  Recent  Labs  08/16/14 0920  TROPONINI <0.30   BNP:  Recent Labs  08/17/14 0515  PROBNP 802.7*   D-Dimer: No results found for this basename: DDIMER,  in the last 72 hours Hemoglobin A1C: No results found for this basename: HGBA1C,  in the last 72 hours Fasting Lipid Panel:  Recent Labs  08/18/14 0504  CHOL 165  HDL 36*  LDLCALC 110*  TRIG 95  CHOLHDL 4.6   Thyroid Function Tests: No results found for this basename: TSH, T4TOTAL, FREET3, T3FREE, THYROIDAB,  in the last 72 hours   Radiology/Studies:  Dg Chest 2 View  08/18/2014   CLINICAL DATA:  Shortness of breath, cough.  EXAM: CHEST  2 VIEW  COMPARISON:  August 16, 2014.  FINDINGS: Cardiomediastinal silhouette appears normal. Stable interstitial densities are noted throughout both lungs, consistent with pulmonary fibrosis or chronic interstitial lung disease. Stable right apical scarring is noted. Degenerative change of right glenohumeral joint is noted. No pneumothorax or pleural effusion is noted.  IMPRESSION: Stable interstitial densities are noted throughout both lungs consistent with scarring or fibrosis. No significant changes noted compared to prior exam.   Electronically Signed   By: Sabino Dick M.D.   On: 08/18/2014 10:55   Dg Hand Complete Left  08/18/2014   CLINICAL DATA:  Rheumatoid factor positive.  Left hand pain.  EXAM: LEFT HAND - COMPLETE 3+ VIEW  COMPARISON:  03/02/2006  FINDINGS: Mild osteoarthritic changes within the DIP joints and third through fifth PIP joints. MCP joints are relatively maintained. No bony erosions. Normal bone mineralization. No fracture, subluxation or dislocation.  IMPRESSION: Mild osteoarthritis in the IP joints.  Otherwise unremarkable.   Electronically Signed   By: Rolm Baptise M.D.   On: 08/18/2014 11:05   Dg Hand Complete Right  08/18/2014   CLINICAL DATA:  Rheumatoid factor positive.  Right hand pain.  EXAM: RIGHT HAND - COMPLETE 3+ VIEW  COMPARISON:  03/02/2006  FINDINGS: Advanced  osteoarthritic changes in the DIP joints, most pronounced in the third through fifth digits and the fourth PIP joint. Mild degenerative changes throughout the remainder of the IP joints. Mild joint space narrowing within the second MCP joint. No bony erosions. Normal bone mineralization. No fracture, subluxation or dislocation.  IMPRESSION: No acute bony abnormality.  Degenerative changes in the IP joints. Mild nonspecific joint space narrowing in the right second MCP joint without bony erosions.   Electronically Signed   By: Rolm Baptise M.D.   On: 08/18/2014 11:07   Ecg: NSR with poor R wave progression  Echo: 08/16/14:Study Conclusions  - Left ventricle: The cavity size was normal. Wall thickness was increased in a pattern of mild LVH. Systolic function was normal. The estimated ejection fraction was in the range of 60% to 65%. Cannot exclude hypokinesis of the basal inferior myocardium, but could be anatomical portion of annulus. Doppler parameters are consistent with abnormal left ventricular relaxation (grade 1 diastolic dysfunction). Doppler parameters are consistent with elevated ventricular end-diastolic filling pressure. - Aortic valve: Trileaflet; mildly calcified leaflets. There was mild to moderate regurgitation. Closer to mild with vena contracta 3 mm. Regurgitation pressure half-time: 431 ms. - Mitral valve: Calcified annulus. There was trivial regurgitation. - Left atrium: The atrium was mildly dilated. - Pulmonary arteries: Systolic pressure could not be accurately estimated. - Inferior vena cava: Not well visualized. Unable to estimate CVP accurately. - Pericardium, extracardiac: There was no pericardial effusion.  Impressions:  - Mild LVH with LVEF 82-95%, grade 1 diastolic dysfunction with increased filling pressures. Cannot exclude hypokinesis of the basal inferior myocardium, but could be anatomical portion of annulus. Mild left atrial enlargement. Mild to moderate  aortic regurgitation. Unable to assess PASP, trivial tricuspid regurgitation.    PHYSICAL EXAM General: Well developed, well nourished, in no acute distress. Head: Normocephalic, atraumatic, sclera non-icteric, oropharynx is clear Neck: Negative for carotid bruits. JVD nis mildly elevated. No adenopathy Lungs: Fine crackles bilatera bases. Heart: RRR S1 S2 without murmurs, rubs, or gallops.  Abdomen: Soft, non-tender, non-distended with normoactive bowel sounds. No hepatomegaly. No rebound/guarding. No obvious abdominal masses. Msk:  Strength and tone appears normal for age. Extremities: No clubbing, cyanosis or edema.  Distal pedal pulses are 2+ and equal bilaterally. Neuro: Alert and oriented X 3. Moves all extremities spontaneously. Psych:  Responds to questions appropriately with a normal affect.  ASSESSMENT AND PLAN: 1. Acute diastolic CHF mild. Agree with gentle diuresis. On lasix 40 mg po daily. Renal function stable.  2. CAD as noted on  CT. No anginal symptoms. I would not pursue ischemic evaluation at this time. 3. Interstitial lung disease with pulmonary fibrosis. This appears to be the major issue. Continue antibiotics and steroids per pulmonary.  4. HLD. LDL 110. With evidence of CAD by CT she should be on statin therapy. Now on lipitor. 5. HTN- Will increase losartan to 100 mg daily.  Patient is stable from a cardiac standpoint. Appears euvolemic. Will sign off now. Please call with further questions.  Present on Admission:  . Dyspnea . Postinflammatory pulmonary fibrosis . Acute respiratory failure with hypoxemia . Diastolic dysfunction with acute on chronic heart failure . Coronary artery disease . Protein-calorie malnutrition  Signed, Chrisotpher Rivero Martin[place], [place] 08/19/2014 7:29 AM

## 2014-08-19 NOTE — Progress Notes (Signed)
PULMONARY / CRITICAL CARE MEDICINE   Name: [name] MRN: [id number] DOB: [birth year]    ADMISSION DATE:  08/15/2014 CONSULTATION DATE:  08/16/14   REFERRING MD : Osborne Casco for Dr Lang Snow  CHIEF COMPLAINT:  sob  INITIAL PRESENTATION:  80 yo female never smoker with hx of pulmonary fibrosis since at least April 2013 (?cause) presented with acute on chronic dyspnea associated with recent onset of rhinitis/post-nasal drip, productive cough, and weakness.  She has been followed previously by Dr. Melvyn Novas.  STUDIES:  PFT's 03/10/10 FEV1 1.68 (130%) ratio 74 , DLCO 48% (vs 45% 04/22/07) corrects to 60%  CT 08/15/14 >> moderate CAD, UIP pattern of pulmonary fibrosis with b/l areas of GGO, BTX RUL ESR 08/16/14  >> 67 Echo 08/16/14 >> mild LVH, EF 60 to 34%, diastolic dysfx Labs 19/62/22 >> ANA negative, RF 32, anti-CCP negative, SS-A/SS-B negative, SCL-70 negative PFT 08/17/14 >> FEV1 1.75 (115%), FEV1% 84, TLC 3.39 (73%), DLCO 45%  SIGNIFICANT EVENTS: Empiric steroids started pm 08/16/14   SUBJECTIVE:  Intermittent cough with brown sputum.  Denies chest pain.  VITAL SIGNS: Temp:  [97.4 F (36.3 C)-98.1 F (36.7 C)] 97.4 F (36.3 C) (10/14 0537) Pulse Rate:  [75-93] 75 (10/14 0537) Resp:  [18] 18 (10/14 0537) BP: (147-166)/(56-70) 166/70 mmHg (10/14 0537) SpO2:  [90 %-95 %] 92 % (10/14 0537) INTAKE / OUTPUT:  Intake/Output Summary (Last 24 hours) at 08/19/14 0903 Last data filed at 08/18/14 2328  Gross per 24 hour  Intake    480 ml  Output   2350 ml  Net  -1870 ml    PHYSICAL EXAMINATION: General: no distress HEENT: no sinus tenderness Cardiac: regular, no murmur Chest: scattered rhonchi, no wheeze Abd: soft, non tender Ext: no joint swelling, rt shoulder stiff with limit ROM Skin: no rashes  LABS:  CBC  Recent Labs Lab 08/16/14 0334 08/17/14 0515 08/19/14 0433  WBC 7.6 18.8* 13.4*  HGB 13.0 13.0 12.3  HCT 37.6 36.9 37.6  PLT 441* 506* 456*   BMET  Recent  Labs Lab 08/16/14 0334 08/17/14 0515 08/19/14 0433  NA 134* 131* 133*  K 4.2 4.3 4.6  CL 97 94* 96  CO2 _0 BUN _1 CREATININE 0.60 0.56 0.69  GLUCOSE 161* 160* 159*   Electrolytes  Recent Labs Lab 08/16/14 0334 08/17/14 0515 08/19/14 0433  CALCIUM 9.9 10.4 9.8   Liver Enzymes  Recent Labs Lab 08/15/14 1456 08/16/14 0334 08/17/14 0515  AST 51* 32 24  ALT 47* 38* 30  ALKPHOS 144* 130* 118*  BILITOT 0.6 0.6 0.4  ALBUMIN 3.0* 2.8* 3.0*   Cardiac Enzymes  Recent Labs Lab 08/15/14 1457 08/15/14 2135 08/16/14 0237 08/16/14 0336 08/16/14 0920 08/17/14 0515  TROPONINI  --  <0.30 <0.30  --  <0.30  --   PROBNP 1859.0*  --   --  1717.0*  --  802.7*   Imaging Dg Chest 2 View  08/18/2014   CLINICAL DATA:  Shortness of breath, cough.  EXAM: CHEST  2 VIEW  COMPARISON:  August 16, 2014.  FINDINGS: Cardiomediastinal silhouette appears normal. Stable interstitial densities are noted throughout both lungs, consistent with pulmonary fibrosis or chronic interstitial lung disease. Stable right apical scarring is noted. Degenerative change of right glenohumeral joint is noted. No pneumothorax or pleural effusion is noted.  IMPRESSION: Stable interstitial densities are noted throughout both lungs consistent with scarring or fibrosis. No significant changes noted compared to prior exam.   Electronically  Signed   By: Sabino Dick M.D.   On: 08/18/2014 10:55   Dg Hand Complete Left  08/18/2014   CLINICAL DATA:  Rheumatoid factor positive.  Left hand pain.  EXAM: LEFT HAND - COMPLETE 3+ VIEW  COMPARISON:  03/02/2006  FINDINGS: Mild osteoarthritic changes within the DIP joints and third through fifth PIP joints. MCP joints are relatively maintained. No bony erosions. Normal bone mineralization. No fracture, subluxation or dislocation.  IMPRESSION: Mild osteoarthritis in the IP joints.  Otherwise unremarkable.   Electronically Signed   By: Rolm Baptise M.D.   On: 08/18/2014  11:05   Dg Hand Complete Right  08/18/2014   CLINICAL DATA:  Rheumatoid factor positive.  Right hand pain.  EXAM: RIGHT HAND - COMPLETE 3+ VIEW  COMPARISON:  03/02/2006  FINDINGS: Advanced osteoarthritic changes in the DIP joints, most pronounced in the third through fifth digits and the fourth PIP joint. Mild degenerative changes throughout the remainder of the IP joints. Mild joint space narrowing within the second MCP joint. No bony erosions. Normal bone mineralization. No fracture, subluxation or dislocation.  IMPRESSION: No acute bony abnormality.  Degenerative changes in the IP joints. Mild nonspecific joint space narrowing in the right second MCP joint without bony erosions.   Electronically Signed   By: Rolm Baptise M.D.   On: 08/18/2014 11:07    ASSESSMENT / PLAN:  Acute hypoxic respiratory failure 2nd to GGO pulmonary infiltrates >> likely combination of acute pneumonitis in setting of undefined ILD with UIP pattern, acute diastolic heart failure, and possible viral pneumonitis.  She has hx of "arthritis", and reports family hx of "fungus" exposure that caused lung scarring in her brother.  Acute hypoxic respiratory failure. Plan: Oxygen to keep SpO2 > 92% >> did not have oxygen desaturation with ambulation on 10/13  Undefined ILD with UIP pattern on CT chest, and possible acute pneumonitis with GGO changes.  Has elevated ESR, and mild elevation of RF (31 IU/ml) from 10/12. Plan: D/c solumedrol and change to prednisone 40 mg on 10/14 >> continue this until f/u as outpt F/u ANCA, aldolase from 10/12 F/u CXR as outpt  ?acute respiratory infection with possible viral pneumonitis. Plan: Symptomatic management Day 5 of rocephin, zithromax per primary team, started 10/10  Rhinitis with post-nasal drip. Plan: Flonase Nasal saline spray  Acute diastolic heart failure. Plan: Per primary team and cardiology  Cough. Plan: Prn mucinex, tramadol  Summary: Clinically improving.   Will transition to oral prednisone, and continue until re-assessed as outpt.  Likely will be ready for d/c home on 10/15.  She has been scheduled for outpt f/u with Dr. Melvyn Novas on Wednesday, 08/26/14, at 9:15 am.  Chesley Mires, MD [place] 08/19/2014, 9:03 AM Pager:  [phone] After 3pm call: [phone]

## 2014-08-19 NOTE — Progress Notes (Signed)
Occupational Therapy Treatment Patient Details Name: [name] MRN: [id number] DOB: 1928/08/03 Today's Date: 08/19/2014    History of present illness [name] is an 80 y.o. Female admitted 08/15/14 with SOB. PMH of intertitial lung disease not requiring oxygen, chronic edema, and GI issues. Pt reports increasing fatigue and weakness over the past few weeks.     OT comments  Encouraged pt to obtain a bag for walker to be safe with carrying items at home  Follow Up Recommendations  Home health OT    Equipment Recommendations  None recommended by OT       Precautions / Restrictions Precautions Precautions: Fall       Mobility Bed Mobility Overal bed mobility: Modified Independent                Transfers Overall transfer level: Needs assistance Equipment used: Rolling walker (2 wheeled) Transfers: Sit to/from Stand Sit to Stand: Supervision Stand pivot transfers: Supervision                ADL Overall ADL's : Needs assistance/impaired             Lower Body Bathing: Supervison/ safety;Sit to/from stand       Lower Body Dressing: Supervision/safety;Sit to/from stand   Toilet Transfer: Supervision/safety;RW   Toileting- Water quality scientist and Hygiene: Supervision/safety;Sit to/from stand   Tub/ Banker: Tub transfer;Grab bars;Rolling walker   Functional mobility during ADLs: Supervision/safety;Rolling walker                  Cognition   Behavior During Therapy: WFL for tasks assessed/performed Overall Cognitive Status: Within Functional Limits for tasks assessed                                    Pertinent Vitals/ Pain       Pain Assessment: No/denies pain  Home Living                                          Prior Functioning/Environment              Frequency Min 2X/week     Progress Toward Goals  OT Goals(current goals can now be found in the care plan section)  Progress towards OT goals: Progressing toward goals     Plan Discharge plan remains appropriate    Co-evaluation                 End of Session Equipment Utilized During Treatment: Rolling walker   Activity Tolerance Patient tolerated treatment well   Patient Left in chair;with call bell/phone within reach;with family/visitor present   Nurse Communication Mobility status        Time: 9150-5697 OT Time Calculation (min): 16 min  Charges: OT Treatments $Self Care/Home Management : 8-22 mins  [name] D 08/19/2014, 12:45 PM

## 2014-08-19 NOTE — Progress Notes (Signed)
Subjective: Feels a little better- able to wean off O2 with sats remaining above 93% with ambulation.  Still feels weak and feels like she needs 24 more hours inpatient before going home alone.  Objective: Vital signs in last 24 hours: Temp:  [97.4 F (36.3 C)-98.1 F (36.7 C)] 97.4 F (36.3 C) (10/14 0537) Pulse Rate:  [75-93] 75 (10/14 0537) Resp:  [18] 18 (10/14 0537) BP: (147-166)/(56-70) 166/70 mmHg (10/14 0537) SpO2:  [90 %-95 %] 92 % (10/14 0537) Weight change:  Last BM Date: 08/17/14  CBG (last 3)  No results found for this basename: GLUCAP,  in the last 72 hours  Intake/Output from previous day: 10/13 0701 - 10/14 0700 In: 480 [P.O.:480] Out: 2350 [Urine:2350] Intake/Output this shift: Total I/O In: -  Out: 750 [Urine:750]  General appearance: alert and no distress Eyes: no scleral icterus Throat: oropharynx moist without erythema Resp: bilateral crackles 1/3 up, coughing with deep inspiration; mild tachypnea with movement Cardio: regular rate and rhythm GI: soft, non-tender; bowel sounds normal; no masses,  no organomegaly Extremities: no clubbing, cyanosis or edema   Lab Results:  Recent Labs  08/17/14 0515 08/19/14 0433  NA 131* 133*  K 4.3 4.6  CL 94* 96  CO2 25 27  GLUCOSE 160* 159*  BUN 15 21  CREATININE 0.56 0.69  CALCIUM 10.4 9.8    Recent Labs  08/17/14 0515  AST 24  ALT 30  ALKPHOS 118*  BILITOT 0.4  PROT 7.1  ALBUMIN 3.0*    Recent Labs  08/17/14 0515 08/19/14 0433  WBC 18.8* 13.4*  NEUTROABS 16.6*  --   HGB 13.0 12.3  HCT 36.9 37.6  MCV 88.3 90.0  PLT 506* 456*   ANA negative CCP antibodies negative SSA/SSB negative Anti-Scl negative ANCA/Aldolase pending   Recent Labs  08/16/14 0920  TROPONINI <0.30    Studies/Results: Dg Chest 2 View  08/18/2014   CLINICAL DATA:  Shortness of breath, cough.  EXAM: CHEST  2 VIEW  COMPARISON:  August 16, 2014.  FINDINGS: Cardiomediastinal silhouette appears normal. Stable  interstitial densities are noted throughout both lungs, consistent with pulmonary fibrosis or chronic interstitial lung disease. Stable right apical scarring is noted. Degenerative change of right glenohumeral joint is noted. No pneumothorax or pleural effusion is noted.  IMPRESSION: Stable interstitial densities are noted throughout both lungs consistent with scarring or fibrosis. No significant changes noted compared to prior exam.   Electronically Signed   By: Sabino Dick M.D.   On: 08/18/2014 10:55   Dg Hand Complete Left  08/18/2014   CLINICAL DATA:  Rheumatoid factor positive.  Left hand pain.  EXAM: LEFT HAND - COMPLETE 3+ VIEW  COMPARISON:  03/02/2006  FINDINGS: Mild osteoarthritic changes within the DIP joints and third through fifth PIP joints. MCP joints are relatively maintained. No bony erosions. Normal bone mineralization. No fracture, subluxation or dislocation.  IMPRESSION: Mild osteoarthritis in the IP joints.  Otherwise unremarkable.   Electronically Signed   By: Rolm Baptise M.D.   On: 08/18/2014 11:05   Dg Hand Complete Right  08/18/2014   CLINICAL DATA:  Rheumatoid factor positive.  Right hand pain.  EXAM: RIGHT HAND - COMPLETE 3+ VIEW  COMPARISON:  03/02/2006  FINDINGS: Advanced osteoarthritic changes in the DIP joints, most pronounced in the third through fifth digits and the fourth PIP joint. Mild degenerative changes throughout the remainder of the IP joints. Mild joint space narrowing within the second MCP joint. No bony erosions. Normal bone  mineralization. No fracture, subluxation or dislocation.  IMPRESSION: No acute bony abnormality.  Degenerative changes in the IP joints. Mild nonspecific joint space narrowing in the right second MCP joint without bony erosions.   Electronically Signed   By: Rolm Baptise M.D.   On: 08/18/2014 11:07     Medications: Scheduled: . aspirin EC  325 mg Oral Daily  . atorvastatin  20 mg Oral q1800  . azithromycin  500 mg Oral Q24H  .  cefTRIAXone (ROCEPHIN)  IV  1 g Intravenous Q24H  . docusate sodium  100 mg Oral BID  . enoxaparin (LOVENOX) injection  40 mg Subcutaneous Q24H  . feeding supplement (ENSURE)  1 Container Oral BID BM  . fluticasone  2 spray Each Nare Daily  . furosemide  40 mg Oral Daily  . guaiFENesin  600 mg Oral BID  . losartan  100 mg Oral Daily  . methylPREDNISolone (SOLU-MEDROL) injection  40 mg Intravenous BID  . pantoprazole  40 mg Oral BID AC  . sertraline  100 mg Oral Daily  . sodium chloride  1 spray Each Nare BID  . sodium chloride  3 mL Intravenous Q12H   Continuous:   Assessment/Plan: 1. Acute on Chronic respiratory failure with hypoxemia- suspect progressive pulmonary fibrosis. ILD first noted on CXR and with decreased DLCO in 5/11 with initial visit with Dr. Melvyn Novas in 1/12- since then symptoms have slowly progressed and have recently reached a critical point with worsening dyspnea, cough and generalized weakness. Continue empiric steroids (?transition to oral prednisone soon per pulmonology??), antibiotics. PFTs confirm restrictive lung disease with decreased DLco. Rheumatoid Factor positive but CCP negative and no erosions on hand X-ray so doubt Rheumatoid Lung.  Other autoimmune serologies negative to date. 2. 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 Consider Pulmonary Rehab. Wean steroids to oral (dose/taper per pulmonology)  3. Acute Diastolic CHF- EF 35-46%. Will continue oral lasix and ARB. Avoid BBlocker due to underlying lung disease. BP remains elevated.  Will consider additional treatment if remains elevated with tapering steroid doses. 4. Rheumatoid Factor positive- CCP negative and no evidence of erosions on Xrays.  Doubt clinically significant RA. 5. CAD- started on statin therapy (fasting lipids pending). Low suspicion of ischemia as cause of dyspnea.  6. Steroid induced leukocytosis- trending down 7. Protein-calorie malnutrition- continue Ensure  8.  Disposition- continue PT/OT- Requires additional 24 hours of inpatient care.  Appreciate Pulmonology guidance on transition to oral steroids and recommended taper.  Plan for discharge tomorrow with [place], [place].  Rx for [employer] placed.  Will need close pulmonology follow-up.   LOS: 4 days   Marton Redwood 08/19/2014, 6:58 AM

## 2014-08-20 LAB — ALDOLASE: ALDOLASE: 8.9 U/L — AB (ref <=8.1)

## 2014-08-20 LAB — CBC
HCT: 40.2 % (ref 36.0–46.0)
HEMOGLOBIN: 13 g/dL (ref 12.0–15.0)
MCH: 29.3 pg (ref 26.0–34.0)
MCHC: 32.3 g/dL (ref 30.0–36.0)
MCV: 90.7 fL (ref 78.0–100.0)
Platelets: 445 10*3/uL — ABNORMAL HIGH (ref 150–400)
RBC: 4.43 MIL/uL (ref 3.87–5.11)
RDW: 14.6 % (ref 11.5–15.5)
WBC: 13.7 10*3/uL — ABNORMAL HIGH (ref 4.0–10.5)

## 2014-08-20 LAB — BASIC METABOLIC PANEL WITH GFR
Anion gap: 8 (ref 5–15)
BUN: 23 mg/dL (ref 6–23)
CO2: 28 meq/L (ref 19–32)
Calcium: 9.8 mg/dL (ref 8.4–10.5)
Chloride: 97 meq/L (ref 96–112)
Creatinine, Ser: 0.71 mg/dL (ref 0.50–1.10)
GFR calc Af Amer: 89 mL/min — ABNORMAL LOW
GFR calc non Af Amer: 77 mL/min — ABNORMAL LOW
Glucose, Bld: 103 mg/dL — ABNORMAL HIGH (ref 70–99)
Potassium: 4.9 meq/L (ref 3.7–5.3)
Sodium: 133 meq/L — ABNORMAL LOW (ref 137–147)

## 2014-08-20 MED ORDER — SALINE SPRAY 0.65 % NA SOLN: 1.0000 | Freq: Two times a day (BID) | NASAL | Status: AC

## 2014-08-20 MED ORDER — FUROSEMIDE 40 MG PO TABS: 40.0000 mg | ORAL_TABLET | Freq: Every day | ORAL | Status: DC

## 2014-08-20 MED ORDER — ATORVASTATIN CALCIUM 20 MG PO TABS: 20.0000 mg | ORAL_TABLET | Freq: Every day | ORAL | Status: DC

## 2014-08-20 MED ORDER — ASPIRIN 325 MG PO TBEC: 325.0000 mg | DELAYED_RELEASE_TABLET | Freq: Every day | ORAL | Status: DC

## 2014-08-20 MED ORDER — PREDNISONE 20 MG PO TABS: 40.0000 mg | ORAL_TABLET | Freq: Every day | ORAL | Status: DC

## 2014-08-20 MED ORDER — LOSARTAN POTASSIUM 100 MG PO TABS: 100.0000 mg | ORAL_TABLET | Freq: Every day | ORAL | Status: DC

## 2014-08-20 MED ORDER — GUAIFENESIN ER 600 MG PO TB12: 600.0000 mg | ORAL_TABLET | Freq: Two times a day (BID) | ORAL | Status: DC

## 2014-08-20 MED ORDER — ENSURE PUDDING PO PUDG: 1.0000 | Freq: Two times a day (BID) | ORAL | Status: DC

## 2014-08-20 NOTE — Progress Notes (Signed)
D/C instructions reviewed w/ pt and family. Both verbalize understanding, all questions answered. Pt d/c in w/c in stable condition by NT to family's car. Pt in possession of d/c instructions, scripts, walker, and all personal belongings.

## 2014-08-20 NOTE — Discharge Summary (Signed)
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 Dyspnea   . Diverticulosis 2008  . Internal hemorrhoids   . Esophageal stricture   . Arthritis   . Breast cancer 1969  . GERD (gastroesophageal reflux disease)   . Gallstones   . Depression   . HLD (hyperlipidemia)   . HTN (hypertension)   . Obesity   . Colon polyp 2013    TUBULAR ADENOMA (X1    Discharge Medications:   Medication List         aspirin 325 MG EC tablet  Take 1 tablet (325 mg total) by mouth daily.     atorvastatin 20 MG tablet  Commonly known as:  LIPITOR  Take 1 tablet (20 mg total) by mouth daily at 6 PM.     esomeprazole 20 MG capsule  Commonly known as:  NEXIUM  Take 20 mg by mouth daily at 12 noon.     feeding supplement (ENSURE) Pudg  Take 1 Container by mouth 2 (two) times daily between meals.     fluticasone 50 MCG/ACT nasal spray  Commonly known as:  FLONASE  Place 2 sprays into both nostrils once as needed for allergies or rhinitis.     furosemide 40 MG tablet  Commonly known as:  LASIX  Take 1 tablet (40 mg total) by mouth daily.     guaiFENesin 600 MG 12 hr tablet  Commonly known as:  MUCINEX  Take 1 tablet (600 mg total) by mouth 2 (two) times daily.     losartan 100 MG tablet  Commonly known as:  COZAAR  Take 1 tablet (100 mg total) by mouth daily.     predniSONE 20 MG tablet  Commonly  known as:  DELTASONE  Take 2 tablets (40 mg total) by mouth daily with breakfast.     sertraline 100 MG tablet  Commonly known as:  ZOLOFT  Take 100 mg by mouth daily.     sodium chloride 0.65 % Soln nasal spray  Commonly known as:  OCEAN  Place 1 spray into both nostrils 2 (two) times daily.     traMADol 50 MG tablet  Commonly known as:  ULTRAM  Take 50 mg by mouth every 6 (six) hours as needed for moderate pain.     TYLENOL 8 HOUR 650 MG CR tablet  Generic drug:  acetaminophen  Take 1,300 mg by mouth 2 (two) times daily.        Hospital Procedures: Dg Chest 2 View  08/18/2014   CLINICAL DATA:  Shortness of breath, cough.  EXAM: CHEST  2 VIEW  COMPARISON:  August 16, 2014.  FINDINGS: Cardiomediastinal silhouette appears normal. Stable interstitial densities are noted throughout both lungs, consistent with pulmonary fibrosis or chronic interstitial lung disease. Stable right apical scarring is noted. Degenerative change of right glenohumeral joint is noted. No pneumothorax or pleural effusion is noted.  IMPRESSION: Stable interstitial densities are noted throughout both lungs consistent with scarring or  fibrosis. No significant changes noted compared to prior exam.   Electronically Signed   By: Sabino Dick M.D.   On: 08/18/2014 10:55   Dg Chest 2 View  08/15/2014   CLINICAL DATA:  Shortness of breath and cough.  EXAM: CHEST - 2 VIEW  COMPARISON:  No prior chest x-rays. Comparison is made to CT appearance of the lung bases on prior CT studies from 07/30/2014 and 02/18/2012.  FINDINGS: There is a component of diffuse bilateral chronic lung disease which is fibrotic in nature. Lung bases on prior abdomen CT shows similar changes. There may be component of acute infiltrate, especially in the right upper lung. No pulmonary edema, pleural effusions or pneumothorax identified. The anterior right hemidiaphragm is mildly elevated. The heart size and mediastinal contours are within normal  limits. The bony thorax shows degenerative disease of the thoracic spine with associated mild rightward convex scoliosis.  IMPRESSION: Diffuse chronic lung disease which most likely represents pulmonary fibrosis. Without prior chest x-rays, it it would be difficult to exclude acute infiltrate, especially in the right upper lobe.   Electronically Signed   By: Aletta Edouard M.D.   On: 08/15/2014 15:11   Ct Angio Chest W/cm &/or Wo Cm  08/15/2014   CLINICAL DATA:  Chronic cough. Shortness of breath aunt exertion. Abnormal chest x-ray earlier same date. Prior right mastectomy for breast cancer.  EXAM: CT ANGIOGRAPHY CHEST WITH CONTRAST  TECHNIQUE: Multidetector CT imaging of the chest was performed using the standard protocol during bolus administration of intravenous contrast. Multiplanar CT image reconstructions and MIPs were obtained to evaluate the vascular anatomy.  CONTRAST:  125m OMNIPAQUE IOHEXOL 350 MG/ML IV.  COMPARISON:  No prior CT.  Two-view chest x-ray earlier same date.  FINDINGS: Contrast opacification of the pulmonary arteries is very good. No filling defects within either main pulmonary artery or their branches in either lung to suggest pulmonary embolism. Heart enlarged. Mitral annular calcification. Moderate 3 vessel coronary atherosclerosis. No pericardial effusion. Moderate to severe atherosclerosis involving the thoracic and upper abdominal aorta without aneurysm or dissection.  Severe interstitial lung disease and associated ground-glass opacity throughout both lungs, with involvement of both upper lobes and both lower lobes. Associated bronchiectasis in the right upper lobe. Biapical pleuroparenchymal scarring, right greater than left. No pulmonary parenchymal nodules or masses. No pleural effusions. Central airways patent with calcified tracheobronchial cartilages.  Normal size lymph nodes in the me mediastinum and in both hila. No significant lymphadenopathy. Prior right mastectomy.  Thyroid gland unremarkable.  Large simple cyst in the anterior segment right lobe of liver beneath the dome, maximum measurements approximating 7.1 x 8.3 x 6.8 cm. Visualized upper abdomen otherwise unremarkable. Bone window images demonstrate diffuse thoracic spondylosis and slight exaggeration of the usual thoracic kyphosis.  Review of the MIP images confirms the above findings.  IMPRESSION: 1. No evidence of pulmonary embolism. 2. Cardiomegaly.  Moderate 3 vessel coronary atherosclerosis. 3. Severe interstitial lung disease involving both upper lobes and both lower lobes, with associated ground-glass opacity. Given the history of chronic cough, I suspect this represents the acute phase of usual interstitial pneumonitis (UIP)/pulmonary fibrosis. 4. Approximate 8 cm simple cyst in the visualized liver.   Electronically Signed   By: TEvangeline DakinM.D.   On: 08/15/2014 17:43   Portable Chest 1 View  08/16/2014   CLINICAL DATA:  Chronic interstitial lung disease ; acute onset dyspnea with exertion  EXAM: PORTABLE CHEST - 1 VIEW  COMPARISON:  August 15, 2014 chest  radiograph and chest CT  FINDINGS: Widespread reticular type interstitial disease/pulmonary fibrosis remains without change. There is stable asymmetric apical pleural thickening on the right compared to the left. No new opacity. Heart size and pulmonary vascularity are overall within normal limits. No adenopathy apparent. There is upper thoracic dextroscoliosis. There is atherosclerotic change in aorta.  IMPRESSION: Stable chronic interstitial lung disease/ pulmonary fibrosis. No new opacity to suggest superimposed edema or consolidation. Subtle acute pneumonia could easily be obscured given the degree of underlying interstitial disease, however.   Electronically Signed   By: Lowella Grip M.D.   On: 08/16/2014 08:22   Dg Hand Complete Left  08/18/2014   CLINICAL DATA:  Rheumatoid factor positive.  Left hand pain.  EXAM: LEFT HAND - COMPLETE  3+ VIEW  COMPARISON:  03/02/2006  FINDINGS: Mild osteoarthritic changes within the DIP joints and third through fifth PIP joints. MCP joints are relatively maintained. No bony erosions. Normal bone mineralization. No fracture, subluxation or dislocation.  IMPRESSION: Mild osteoarthritis in the IP joints.  Otherwise unremarkable.   Electronically Signed   By: Rolm Baptise M.D.   On: 08/18/2014 11:05   Dg Hand Complete Right  08/18/2014   CLINICAL DATA:  Rheumatoid factor positive.  Right hand pain.  EXAM: RIGHT HAND - COMPLETE 3+ VIEW  COMPARISON:  03/02/2006  FINDINGS: Advanced osteoarthritic changes in the DIP joints, most pronounced in the third through fifth digits and the fourth PIP joint. Mild degenerative changes throughout the remainder of the IP joints. Mild joint space narrowing within the second MCP joint. No bony erosions. Normal bone mineralization. No fracture, subluxation or dislocation.  IMPRESSION: No acute bony abnormality.  Degenerative changes in the IP joints. Mild nonspecific joint space narrowing in the right second MCP joint without bony erosions.   Electronically Signed   By: Rolm Baptise M.D.   On: 08/18/2014 11:07   Pulmonary Function Tests (10/12):  FEV1/FVC 84%  TLC 73% predicted  DLco cor 46% predicted  Echocardiogram (10/11)- Impressions: - Mild LVH with LVEF 83-41%, grade 1 diastolic dysfunction with increased filling pressures. Cannot exclude hypokinesis of the basal inferior myocardium, but could be anatomical portion of annulus. Mild left atrial enlargement. Mild to moderate aortic regurgitation. Unable to assess PASP, trivial tricuspid regurgitation.  History of Present Illness: Hser Belanger is an 81 year old female with a history of interstitial lung disease not requiring oxygen, who came to the ER this evening with worsening of her chronic complaints. Has chronic cough, but started developing dyspnea on exertion. She has a myriad of other complaints including  chronic edema and GI issues which have been addressed in the past month. She has a pending OV with [facility], but it is several weeks away.   Hospital Course: Ms. Suliman was admitted to a telemetry bed. She was placed on oxygen due to hypoxia initially and started on empiric antibiotics and oral steroids. Pulmonology was consulted due to her hypoxia in the setting of pulmonary fibrosis. Recommended transitioning her to IV Solu-Medrol and ordered serologic studies including sedimentation rate, autoimmune studies.  Pulmonary function tests were performed that showed a total lung capacity of 73% predicted with a DLCO of 46% predicted consistent with restrictive lung disease with probable fibrosis. With the above treatment her hypoxia resolved and she was weaned off of oxygen. She reported some improvement in her dyspnea but continued to have cough with deep inspiration and dyspnea with activity. It is felt she has a chronic underlying interstitial  lung disease, probable pulmonary fibrosis with minimal reversibility. She has been transitioned to oral prednisone which she will stay on until her followup with pulmonology. She has completed 5 days of IV antibiotics which will be discontinued on discharge. Chest x-ray has not shown any clear infiltrate to suggest pneumonia. She has steroid-induced leukocytosis but no fever.  Ms. Ganaway was also evaluated by cardiology due to an elevated BNP and recent echocardiogram showing wall motion abnormalities. An echocardiogram was repeated and showed an ejection fraction of 60-65% findings consistent with diastolic dysfunction. She was placed on a Lasix and her losartan dose was titrated up for better blood pressure control. Cardiology did not feel that further ischemia evaluation was necessary in the absence of chest pain.   Patient reports some improvement in her generalized weakness. She is able to ambulate independently with a walker. She was evaluated by  physical therapy and occupational therapy who recommended home health services. Physical therapy/occupational therapy and home health nursing have been ordered for her at home. At this point, she is stable for discharge with close outpatient followup.  Day of Discharge Exam BP 162/70  Pulse 67  Temp(Src) 97.7 F (36.5 C) (Oral)  Resp 18  Ht _0  (1.575 m)  Wt 72.2 kg (159 lb 2.8 oz)  BMI 29.11 kg/m2  SpO2 97%  Physical Exam: General appearance: alert and no distress Eyes: no scleral icterus Throat: oropharynx moist without erythema Resp: Crackles one fourth of the way up bilaterally, slightly improved Cardio: regular rate and rhythm GI: soft, non-tender; bowel sounds normal; no masses,  no organomegaly Extremities: no clubbing, cyanosis or edema  Discharge Labs:  Recent Labs  08/19/14 0433 08/20/14 0442  NA 133* 133*  K 4.6 4.9  CL 96 97  CO2 27 28  GLUCOSE 159* 103*  BUN 21 23  CREATININE 0.69 0.71  CALCIUM 9.8 9.8     Recent Labs  08/19/14 0433 08/20/14 0442  WBC 13.4* 13.7*  HGB 12.3 13.0  HCT 37.6 40.2  MCV 90.0 90.7  PLT 456* 445*   Hepatic Function Latest Ref Rng 08/17/2014 08/16/2014 08/15/2014  Total Protein 6.0 - 8.3 g/dL 7.1 7.0 7.0  Albumin 3.5 - 5.2 g/dL 3.0(L) 2.8(L) 3.0(L)  AST 0 - 37 U/L 24 32 51(H)  ALT 0 - 35 U/L 30 38(H) 47(H)  Alk Phosphatase 39 - 117 U/L 118(H) 130(H) 144(H)  Total Bilirubin 0.3 - 1.2 mg/dL 0.4 0.6 0.6   TSH- 0.589 Sed Rate 67 ANA negative Rheumatoid Factor positive (31) CCP antibodies- negative C-ANCA/P-ANCA- negative SSA/SSB- negative Anti Scl-70- negative Aldolase 8.9 (normal 8.1)  BNP (last 3 results)  Recent Labs  08/15/14 1457 08/16/14 0336 08/17/14 0515  PROBNP 1859.0* 1717.0* 802.7*   Troponin:- <0.3  Discharge instructions:     Discharge Instructions   Diet - low sodium heart healthy    Complete by:  As directed      Discharge instructions    Complete by:  As directed   Call if  increased shortness of breath, fever, sputum production.  Fall precautions- use walker when ambulating.     Increase activity slowly    Complete by:  As directed            Disposition: To home with home health services  Follow-up Appts: Follow-up with Dr. Brigitte Pulse at [facility] in 1 week.  Our office will call for transition of care visit.   Condition on Discharge: Stable/improved  Tests Needing Follow-up: None Time with  discharge activities: 40 minutes  Signed: Marton Redwood 08/20/2014, 7:26 AM

## 2014-08-20 NOTE — Discharge Instructions (Signed)
Idiopathic Pulmonary Fibrosis Idiopathic pulmonary fibrosis is an inflammation (soreness and irritation) in the lungs which eventually causes scarring. This usually shows in middle age over a several year time period. The cause is unknown (idiopathic). Usually death occurs after several years but there are no time tables which will predict perfectly what the course of the illness will be. It affects males and females equally. In this condition there is a formation of fibrous (tough leathery) tissue in the small ducts which carry air to and from your lungs. Because of this, the lungs do not work as well as they should for taking in oxygen from the air you breathe and getting rid of wastes (carbon dioxide). Because the lungs are damaged, there may be more problems with infections or the heart.  Other problems may include pulmonary hypertension (high blood pressure in the lungs), and formation of blood clots. Some symptoms of this illness are:  Coughing and breathing difficulties; cough is usually dry and hacking.  Bluish (cyanotic) skin and lips due to lack of circulating oxygen.  Loss of appetite.  Loss of strength which comes from just the increased work of breathing.  Rapid, shallow breathing occur with moderate exercise and later even while resting.  Increasing shortness of breath (dyspnea) which progresses as the disease gets worse.  Weight loss and fatigue due partly to the increased work of breathing.  Clubbing of the fingers (the ends of the fingers become rounded and enlarged). DIAGNOSIS  A diagnosis of idiopathic pulmonary fibrosis may be suspected based on exam and a patient's history.  Specialized X-rays, pulmonary function tests, pulse oximetry, and laboratory tests including blood gasses help confirm the problem.  Sometimes a biopsy is done and a small piece of lung tissue is removed. This may be done through a bronchoscope or during an operation in which the chest is opened. This  is looked at under a microscope by a specialist who can tell what the lung problem is. CAUSES If the cause of pulmonary fibrosis is known, it is no longer known as idiopathic. Several causes of pulmonary fibrosis include:  Occupational and environmental exposures to asbestos, silica, and or metal dusts.  Illegal or street drug use.  Agricultural workers may inhale substances, such as moldy hay, which can cause an allergic reaction in the lung. This reaction is called Farmer's Lung and can cause pulmonary fibrosis. Some other fumes found on farms are directly toxic to the lungs.  Exposure of the lungs to radiation.  Collagen diseases.  Sarcoidosis is a disease which forms granulomas (areas of inflammatory cells), which can attack any area of the body but most frequently affects the lungs.  Drugs. Certain medicines may have the undesirable side effect of causing pulmonary fibrosis. Check with your doctor about the medicines you are taking and ask about any possible side effects.  Some cases of pulmonary fibrosis seem to be genetic. TREATMENT   There are no drugs currently approved for the treatment of pulmonary fibrosis. Steroids (a potent medication which cuts down on inflammation) are sometimes given to prevent lung changes before they become permanent. High doses may be recommended at first, followed by lower maintenance dosages. Other medications may be tried if steroids do not work.  Lung disease may be monitored with X-rays and laboratory work.  Oxygen may be helpful if oxygen in the blood is diminished. This improves the quality of life. Your caregiver will give you a prescription for this if it is helpful.  Antibiotics are used   for treatment of infections.  Exercise may be beneficial.  Lung transplants are being investigated and a single lung transplant may be considered for some patients.  Influenza vaccine and pneumococcal pneumonia vaccine are both recommended for people  with IPF or any lung disease. These two shots may help keep you healthy. Document Released: 01/13/2004 Document Revised: 01/15/2012 Document Reviewed: 10/23/2005 Exit[employer] Patient Information 2015 [employer]. This information is not intended to replace advice given to you by your health care provider. Make sure you discuss any questions you have with your health care provider.  

## 2014-08-22 DIAGNOSIS — J849 Interstitial pulmonary disease, unspecified: Secondary | ICD-10-CM | POA: Diagnosis not present

## 2014-08-22 DIAGNOSIS — I2510 Atherosclerotic heart disease of native coronary artery without angina pectoris: Secondary | ICD-10-CM | POA: Diagnosis not present

## 2014-08-22 DIAGNOSIS — I5031 Acute diastolic (congestive) heart failure: Secondary | ICD-10-CM | POA: Diagnosis not present

## 2014-08-22 DIAGNOSIS — E669 Obesity, unspecified: Secondary | ICD-10-CM | POA: Diagnosis not present

## 2014-08-22 DIAGNOSIS — M1990 Unspecified osteoarthritis, unspecified site: Secondary | ICD-10-CM | POA: Diagnosis not present

## 2014-08-22 DIAGNOSIS — I10 Essential (primary) hypertension: Secondary | ICD-10-CM | POA: Diagnosis not present

## 2014-08-24 ENCOUNTER — Telehealth: Payer: Self-pay | Admitting: Cardiology

## 2014-08-24 NOTE — Telephone Encounter (Signed)
Patient would like to move her new patient post hospital visit from 10/22. She does not think she will be able to make this appointment.   Provided patient with the phone number to the [facility] so that she can reschedule her appointment.

## 2014-08-24 NOTE — Telephone Encounter (Signed)
Pt called in stating that she was recently admitted in to the hospital and she was under the impression that based on all the test that were ran, that she no longer had to see a cardiologist. She would just like to know if that information is true. Please call  Thanks

## 2014-08-25 DIAGNOSIS — I5031 Acute diastolic (congestive) heart failure: Secondary | ICD-10-CM | POA: Diagnosis not present

## 2014-08-25 DIAGNOSIS — E669 Obesity, unspecified: Secondary | ICD-10-CM | POA: Diagnosis not present

## 2014-08-25 DIAGNOSIS — I10 Essential (primary) hypertension: Secondary | ICD-10-CM | POA: Diagnosis not present

## 2014-08-25 DIAGNOSIS — J849 Interstitial pulmonary disease, unspecified: Secondary | ICD-10-CM | POA: Diagnosis not present

## 2014-08-25 DIAGNOSIS — M1990 Unspecified osteoarthritis, unspecified site: Secondary | ICD-10-CM | POA: Diagnosis not present

## 2014-08-25 DIAGNOSIS — I2510 Atherosclerotic heart disease of native coronary artery without angina pectoris: Secondary | ICD-10-CM | POA: Diagnosis not present

## 2014-08-26 ENCOUNTER — Inpatient Hospital Stay: Payer: Medicare Other | Admitting: Internal Medicine

## 2014-08-26 DIAGNOSIS — I2510 Atherosclerotic heart disease of native coronary artery without angina pectoris: Secondary | ICD-10-CM | POA: Diagnosis not present

## 2014-08-26 DIAGNOSIS — I5031 Acute diastolic (congestive) heart failure: Secondary | ICD-10-CM | POA: Diagnosis not present

## 2014-08-26 DIAGNOSIS — M1990 Unspecified osteoarthritis, unspecified site: Secondary | ICD-10-CM | POA: Diagnosis not present

## 2014-08-26 DIAGNOSIS — J849 Interstitial pulmonary disease, unspecified: Secondary | ICD-10-CM | POA: Diagnosis not present

## 2014-08-26 DIAGNOSIS — I10 Essential (primary) hypertension: Secondary | ICD-10-CM | POA: Diagnosis not present

## 2014-08-26 DIAGNOSIS — E669 Obesity, unspecified: Secondary | ICD-10-CM | POA: Diagnosis not present

## 2014-08-27 ENCOUNTER — Ambulatory Visit: Payer: Medicare Other | Admitting: Cardiology

## 2014-08-27 DIAGNOSIS — M1990 Unspecified osteoarthritis, unspecified site: Secondary | ICD-10-CM | POA: Diagnosis not present

## 2014-08-27 DIAGNOSIS — I10 Essential (primary) hypertension: Secondary | ICD-10-CM | POA: Diagnosis not present

## 2014-08-27 DIAGNOSIS — I5031 Acute diastolic (congestive) heart failure: Secondary | ICD-10-CM | POA: Diagnosis not present

## 2014-08-27 DIAGNOSIS — E669 Obesity, unspecified: Secondary | ICD-10-CM | POA: Diagnosis not present

## 2014-08-27 DIAGNOSIS — J849 Interstitial pulmonary disease, unspecified: Secondary | ICD-10-CM | POA: Diagnosis not present

## 2014-08-27 DIAGNOSIS — I2510 Atherosclerotic heart disease of native coronary artery without angina pectoris: Secondary | ICD-10-CM | POA: Diagnosis not present

## 2014-08-28 ENCOUNTER — Ambulatory Visit: Payer: Medicare Other | Admitting: Cardiology

## 2014-08-28 DIAGNOSIS — Z23 Encounter for immunization: Secondary | ICD-10-CM | POA: Diagnosis not present

## 2014-08-31 ENCOUNTER — Encounter: Payer: Self-pay | Admitting: Internal Medicine

## 2014-08-31 ENCOUNTER — Ambulatory Visit (INDEPENDENT_AMBULATORY_CARE_PROVIDER_SITE_OTHER): Payer: Medicare Other | Admitting: Internal Medicine

## 2014-08-31 VITALS — BP 136/62 | HR 96 | Ht 61.0 in | Wt 158.0 lb

## 2014-08-31 DIAGNOSIS — J9601 Acute respiratory failure with hypoxia: Secondary | ICD-10-CM

## 2014-08-31 DIAGNOSIS — J9600 Acute respiratory failure, unspecified whether with hypoxia or hypercapnia: Secondary | ICD-10-CM | POA: Insufficient documentation

## 2014-08-31 DIAGNOSIS — J8410 Pulmonary fibrosis, unspecified: Secondary | ICD-10-CM

## 2014-08-31 DIAGNOSIS — I2510 Atherosclerotic heart disease of native coronary artery without angina pectoris: Secondary | ICD-10-CM

## 2014-08-31 NOTE — Assessment & Plan Note (Addendum)
-    Followed in Pulmonary clinic/ [facility]/ [facility]  PFT's 03/10/10 FEV1 1.68 (130%) ratio 74 , DLCO 48% (vs 45% 04/22/07) corrects to 60%  See inpt adm 08/15/14  -CT 08/15/14 >> moderate CAD, UIP pattern of pulmonary fibrosis with b/l areas of GGO, BTX RUL  -ESR 08/16/14 >> 67 > started systemic steroids as inpt -Echo 08/16/14 >> mild LVH, EF 60 to 21%, diastolic dysfx  -Labs 58/72/76 >> ANA negative, RF 32, anti-CCP negative, SS-A/SS-B negative, SCL-70 negative  -PFT 08/17/14 >> FEV1 1.75 (115%), FEV1% 84, TLC 3.39 (73%), DLCO 45% - 08/31/2014  Walked RA x 3 laps @ 185 ft each stopped due to  End of study, nl pace, no desats   Clearly this is not typical uip and has proven at least partially steroid responsive typical of connective tissue dz related PF  The goal with a chronic steroid dependent illness is always arriving at the lowest effective dose that controls the disease/symptoms and not accepting a set "formula" which is based on statistics or guidelines that don't always take into account patient  variability or the natural hx of the dz in every individual patient, which may well vary over time.  For now therefore I recommend the patient maintain  Reduce to 20 mg daily x 4 weeks then regroup   Also, Use of PPI is associated with improved survival time and with decreased radiologic fibrosis per King's study published in AJRCCM vol 184 p1390.  Dec 2011  This may not be cause and effect, but given how universally unhelpful all the otherstudy drugs have been for pf,   rec continue  rx ppi / diet/ lifestyle modification.

## 2014-08-31 NOTE — Patient Instructions (Signed)
Nexium should be 40 mg daily automatically  Take 30-60 min before first meal of the day and pepcid ac 20 mg at  Bedtime until you return   Reduce prednisone to 20 mg daily with bfast   GERD (REFLUX)  is an extremely common cause of respiratory symptoms, many times with no significant heartburn at all.    It can be treated with medication, but also with lifestyle changes including avoidance of late meals, excessive alcohol, smoking cessation, and avoid fatty foods, chocolate, peppermint, colas, red wine, and acidic juices such as orange juice.  NO MINT OR MENTHOL PRODUCTS SO NO COUGH DROPS  USE SUGARLESS CANDY INSTEAD (jolley ranchers or Stover's)  NO OIL BASED VITAMINS - use powdered substitutes.    Please schedule a follow up office visit in 4 weeks, sooner if needed

## 2014-08-31 NOTE — Assessment & Plan Note (Signed)
Resolved

## 2014-08-31 NOTE — Progress Notes (Addendum)
49 yowf never smoker referred by Dr Brigitte Pulse for eval of new onset sob early 2012   November 28, 2010 1st pulmonary office eval for new onset doe x across the room, abupt onset right after NY's, assoc with increase cough getting better and chest tightness assoc also with nasal congestion.. sleeping better most of the nights without noct exac or early am excess mucus production.  rec No evidence asthma copd  Try rx for gerd and off all resp meds and f/u in 2 weeks if not better > did not return   Date of Admission: 08/15/2014  Date of Discharge: 08/20/2014   Consults:  Pulmonology- Christinia Gully, MD  Cardiology- Peter Martinique, MD  Discharge Diagnoses:  Acute respiratory failure with hypoxemia  Interstitial Lung Disease/Postinflammatory pulmonary fibrosis > started steroids 08/16/14  Essential hypertension  Dyspnea  Diastolic dysfunction with acute on chronic heart failure (EF 60-65%)  Coronary artery disease  Leukocytosis  Protein-calorie malnutrition  Hyponatremia    08/31/2014 post hosp f/u ov/Yazen Rosko re: pf/ ? Connective tissue dz related  Chief Complaint  Patient presents with  . HFU    Pt states that her breathing has improved, but not back to baseline. No new co's today.   improved to point where could do HT on day of ov on RA Prednisone 2 with breakfast  = 40 mg  No obvious day to day or daytime variabilty or assoc chronic cough or cp or chest tightness, subjective wheeze overt sinus or hb symptoms. No unusual exp hx or h/o childhood pna/ asthma or knowledge of premature birth.  Sleeping ok without nocturnal  or early am exacerbation  of respiratory  c/o's or need for noct saba. Also denies any obvious fluctuation of symptoms with weather or environmental changes or other aggravating or alleviating factors except as outlined above   Current Medications, Allergies, Complete Past Medical History, Past Surgical History, Family History, and Social History were reviewed in [employer] record.  ROS  The following are not active complaints unless bolded sore throat, dysphagia, dental problems, itching, sneezing,  nasal congestion or excess/ purulent secretions, ear ache,   fever, chills, sweats, unintended wt loss, pleuritic or exertional cp, hemoptysis,  orthopnea pnd or leg swelling, presyncope, palpitations, heartburn, abdominal pain, anorexia, nausea, vomiting, diarrhea  or change in bowel or urinary habits, change in stools or urine, dysuria,hematuria,  rash, arthralgias, visual complaints, headache, numbness weakness or ataxia or problems with walking or coordination,  change in mood/affect or memory.                Past Surgical History:  Hysterectomy  Cholecystectomy mid 90's  Radial rt mastectomy 1969   Family History:  Emphysema- Brother  Heart dz- Both Parents  Bladder CA- Mother  Esophageal CA- Brother  Prostate CA- Brother   Social History:  Married  No children  Retired Web designer  Never smoker  ETOH rare    :               Pex  amb wf nad  Wt Readings from Last 3 Encounters:  08/31/14 158 lb (71.668 kg)  08/15/14 159 lb 2.8 oz (72.2 kg)  08/07/14 162 lb (73.483 kg)     HEENT: nl dentition, turbinates, and orophanx. Nl external ear canals without cough reflex   NECK :  without JVD/Nodes/TM/ nl carotid upstrokes bilaterally   LUNGS: no acc muscle use,  Bilateral insp crackles s cough on insp   CV:  RRR  no s3 or murmur or increase in P2, no edema   ABD:  soft and nontender with nl excursion in the supine position. No bruits or organomegaly, bowel sounds nl  MS:  warm without deformities, calf tenderness, cyanosis or clubbing  SKIN: warm and dry without lesions    NEURO:  alert, approp, no deficits    08/18/14 cxr Stable interstitial densities are noted throughout both lungs  consistent with scarring or fibrosis. No significant changes noted  compared to prior exam.

## 2014-09-03 DIAGNOSIS — I10 Essential (primary) hypertension: Secondary | ICD-10-CM | POA: Diagnosis not present

## 2014-09-03 DIAGNOSIS — J849 Interstitial pulmonary disease, unspecified: Secondary | ICD-10-CM | POA: Diagnosis not present

## 2014-09-03 DIAGNOSIS — I2510 Atherosclerotic heart disease of native coronary artery without angina pectoris: Secondary | ICD-10-CM | POA: Diagnosis not present

## 2014-09-03 DIAGNOSIS — I5031 Acute diastolic (congestive) heart failure: Secondary | ICD-10-CM | POA: Diagnosis not present

## 2014-09-03 DIAGNOSIS — E669 Obesity, unspecified: Secondary | ICD-10-CM | POA: Diagnosis not present

## 2014-09-03 DIAGNOSIS — M1990 Unspecified osteoarthritis, unspecified site: Secondary | ICD-10-CM | POA: Diagnosis not present

## 2014-09-15 NOTE — Progress Notes (Signed)
HPI: FU dyspnea. Patient also with history of pulmonary fibrosis. Recently admitted 10/15 with worsening dyspnea. Echo 10/15 showed normal LV function, mild LVH, grade 1 diastolic dysfunction, mild to moderate AI, mild LAE, trace MR. Chest CT 10/15 showed no pulmonary embolus, 3 vessel CAD, severe ILD. Patient treated with steroids and antibiotics with improvement; also treated with diuretics for diastolic CHF (proBNP 1517). Treated medically for presumed CAD based on CT results. Since DC, Her dyspnea has improved. She notes some dyspnea on exertion but no orthopnea, PND, pedal edema, chest pain or syncope.  Current Outpatient Prescriptions  Medication Sig Dispense Refill  . acetaminophen (TYLENOL 8 HOUR) 650 MG CR tablet Take 1,300 mg by mouth 2 (two) times daily.    Marland Kitchen aspirin EC 325 MG EC tablet Take 1 tablet (325 mg total) by mouth daily. 30 tablet 0  . atorvastatin (LIPITOR) 20 MG tablet Take 1 tablet (20 mg total) by mouth daily at 6 PM. 30 tablet 6  . esomeprazole (NEXIUM) 20 MG capsule Take 20 mg by mouth daily.     . fluticasone (FLONASE) 50 MCG/ACT nasal spray Place 2 sprays into both nostrils once as needed for allergies or rhinitis.    . furosemide (LASIX) 40 MG tablet Take 1 tablet (40 mg total) by mouth daily. 30 tablet 6  . guaiFENesin (MUCINEX) 600 MG 12 hr tablet Take 1 tablet (600 mg total) by mouth 2 (two) times daily.    Marland Kitchen losartan (COZAAR) 100 MG tablet Take 1 tablet (100 mg total) by mouth daily. 30 tablet 6  . predniSONE (DELTASONE) 20 MG tablet Take 2 tablets (40 mg total) by mouth daily with breakfast. 60 tablet 1  . sertraline (ZOLOFT) 100 MG tablet Take 100 mg by mouth daily.     . sodium chloride (OCEAN) 0.65 % SOLN nasal spray Place 1 spray into both nostrils 2 (two) times daily.  0  . traMADol (ULTRAM) 50 MG tablet Take 50 mg by mouth every 6 (six) hours as needed for moderate pain.      No current facility-administered medications for this visit.     Past  Medical History  Diagnosis Date  . Dyspnea   . Diverticulosis 2008  . Internal hemorrhoids   . Esophageal stricture   . Arthritis   . Breast cancer 1969  . GERD (gastroesophageal reflux disease)   . Gallstones   . Depression   . HLD (hyperlipidemia)   . HTN (hypertension)   . Obesity   . Colon polyp 2013    TUBULAR ADENOMA (X1  . Pulmonary fibrosis   . CAD (coronary artery disease)     Past Surgical History  Procedure Laterality Date  . Abdominal hysterectomy    . Cholecystectomy    . Mastectomy      right  . Appendectomy    . Foot surgery    . Breast surgery      History   Social History  . Marital Status: Married    Spouse Name: N/A    Number of Children: 0  . Years of Education: N/A   Occupational History  . retired    Social History Main Topics  . Smoking status: Never Smoker   . Smokeless tobacco: Never Used  . Alcohol Use: 4.2 oz/week    7 Glasses of wine per week     Comment: ocaasional  . Drug Use: No  . Sexual Activity: Not on file   Other Topics Concern  .  Not on file   Social History Narrative    ROS: no fevers or chills, productive cough, hemoptysis, dysphasia, odynophagia, melena, hematochezia, dysuria, hematuria, rash, seizure activity, orthopnea, PND, pedal edema, claudication. Remaining systems are negative.  Physical Exam: Well-developed well-nourished in no acute distress.  Skin is warm and dry.  HEENT is normal.  Neck is supple.  Chest With dry basilar crackles Cardiovascular exam is regular rate and rhythm. 2/6 systolic murmur left sternal border. Abdominal exam nontender or distended. No masses palpated. Extremities show no edema. neuro grossly intact  ECG 08/15/2014-sinus rhythm, incomplete right bundle branch block, septal infarct.  Electrocardiogram today shows sinus rhythm, incomplete right bundle branch block and prior septal infarct cannot be excluded.

## 2014-09-17 ENCOUNTER — Ambulatory Visit (INDEPENDENT_AMBULATORY_CARE_PROVIDER_SITE_OTHER): Payer: Medicare Other | Admitting: Cardiology

## 2014-09-17 ENCOUNTER — Encounter: Payer: Self-pay | Admitting: Cardiology

## 2014-09-17 VITALS — BP 120/60 | HR 81 | Ht 61.0 in | Wt 158.2 lb

## 2014-09-17 DIAGNOSIS — I2583 Coronary atherosclerosis due to lipid rich plaque: Secondary | ICD-10-CM | POA: Diagnosis not present

## 2014-09-17 DIAGNOSIS — J8410 Pulmonary fibrosis, unspecified: Secondary | ICD-10-CM | POA: Diagnosis not present

## 2014-09-17 DIAGNOSIS — I2510 Atherosclerotic heart disease of native coronary artery without angina pectoris: Secondary | ICD-10-CM | POA: Diagnosis not present

## 2014-09-17 DIAGNOSIS — I10 Essential (primary) hypertension: Secondary | ICD-10-CM

## 2014-09-17 DIAGNOSIS — I5033 Acute on chronic diastolic (congestive) heart failure: Secondary | ICD-10-CM | POA: Diagnosis not present

## 2014-09-17 DIAGNOSIS — E785 Hyperlipidemia, unspecified: Secondary | ICD-10-CM | POA: Insufficient documentation

## 2014-09-17 LAB — BASIC METABOLIC PANEL WITHOUT GFR
BUN: 16 mg/dL (ref 6–23)
CO2: 25 meq/L (ref 19–32)
Calcium: 10 mg/dL (ref 8.4–10.5)
Chloride: 93 meq/L — ABNORMAL LOW (ref 96–112)
Creat: 0.8 mg/dL (ref 0.50–1.10)
GFR, Est African American: 78 mL/min
GFR, Est Non African American: 68 mL/min
Glucose, Bld: 86 mg/dL (ref 70–99)
Potassium: 4.4 meq/L (ref 3.5–5.3)
Sodium: 131 meq/L — ABNORMAL LOW (ref 135–145)

## 2014-09-17 LAB — LIPID PANEL
CHOL/HDL RATIO: 2.1 ratio
CHOLESTEROL: 174 mg/dL (ref 0–200)
HDL: 82 mg/dL (ref >39)
LDL Cholesterol: 69 mg/dL (ref 0–99)
TRIGLYCERIDES: 115 mg/dL (ref <150)
VLDL: 23 mg/dL (ref 0–40)

## 2014-09-17 LAB — HEPATIC FUNCTION PANEL
ALT: 18 U/L (ref 0–35)
AST: 20 U/L (ref 0–37)
Albumin: 4.1 g/dL (ref 3.5–5.2)
Alkaline Phosphatase: 47 U/L (ref 39–117)
Bilirubin, Direct: 0.2 mg/dL (ref 0.0–0.3)
Indirect Bilirubin: 0.6 mg/dL (ref 0.2–1.2)
Total Bilirubin: 0.8 mg/dL (ref 0.2–1.2)
Total Protein: 6.9 g/dL (ref 6.0–8.3)

## 2014-09-17 NOTE — Assessment & Plan Note (Signed)
Continue statin. Check lipids and liver. 

## 2014-09-17 NOTE — Assessment & Plan Note (Signed)
Blood pressure controlled. Continue present medications. 

## 2014-09-17 NOTE — Patient Instructions (Signed)
Your physician recommends that you schedule a follow-up appointment in: 3 MONTHS WITH DR CRENSHAW  Your physician recommends that you HAVE LAB WORK TODAY  

## 2014-09-17 NOTE — Assessment & Plan Note (Signed)
She is euvolemic on examination. Continue present dose of Lasix. Check potassium, renal function and BNP.

## 2014-09-17 NOTE — Assessment & Plan Note (Signed)
Management per pulmonary. 

## 2014-09-17 NOTE — Assessment & Plan Note (Signed)
Patient's CT scan showed coronary calcification. Continue aspirin and statin. We discussed options of further evaluation today. She does have dyspnea but I think this is predominantly related to her pulmonary disease. I cannot completely exclude contribution from coronary disease. We will plan medical therapy for now and I will consider a nuclear study as her pulmonary function improves.

## 2014-09-18 DIAGNOSIS — Z6830 Body mass index (BMI) 30.0-30.9, adult: Secondary | ICD-10-CM | POA: Diagnosis not present

## 2014-09-18 DIAGNOSIS — I10 Essential (primary) hypertension: Secondary | ICD-10-CM | POA: Diagnosis not present

## 2014-09-18 DIAGNOSIS — I2510 Atherosclerotic heart disease of native coronary artery without angina pectoris: Secondary | ICD-10-CM | POA: Diagnosis not present

## 2014-09-18 DIAGNOSIS — R600 Localized edema: Secondary | ICD-10-CM | POA: Diagnosis not present

## 2014-09-18 DIAGNOSIS — E785 Hyperlipidemia, unspecified: Secondary | ICD-10-CM | POA: Diagnosis not present

## 2014-09-18 DIAGNOSIS — J8410 Pulmonary fibrosis, unspecified: Secondary | ICD-10-CM | POA: Diagnosis not present

## 2014-09-18 LAB — BRAIN NATRIURETIC PEPTIDE: Brain Natriuretic Peptide: 133.7 pg/mL — ABNORMAL HIGH (ref 0.0–100.0)

## 2014-09-28 ENCOUNTER — Ambulatory Visit (INDEPENDENT_AMBULATORY_CARE_PROVIDER_SITE_OTHER): Payer: Medicare Other | Admitting: Internal Medicine

## 2014-09-28 ENCOUNTER — Encounter: Payer: Self-pay | Admitting: Internal Medicine

## 2014-09-28 DIAGNOSIS — J9600 Acute respiratory failure, unspecified whether with hypoxia or hypercapnia: Secondary | ICD-10-CM | POA: Diagnosis not present

## 2014-09-28 DIAGNOSIS — I2510 Atherosclerotic heart disease of native coronary artery without angina pectoris: Secondary | ICD-10-CM | POA: Diagnosis not present

## 2014-09-28 DIAGNOSIS — J8410 Pulmonary fibrosis, unspecified: Secondary | ICD-10-CM | POA: Diagnosis not present

## 2014-09-28 MED ORDER — PREDNISONE 10 MG PO TABS: ORAL_TABLET | ORAL | Status: DC

## 2014-09-28 NOTE — Assessment & Plan Note (Addendum)
PFT's 03/10/10 FEV1 1.68 (130%) ratio 74 , DLCO 48% (vs 45% 04/22/07) corrects to 60%  See inpt adm 08/15/14  -CT 08/15/14 >> moderate CAD, UIP pattern of pulmonary fibrosis with b/l areas of GGO, BTX RUL  -ESR 08/16/14 >> 67 > started systemic steroids as inpt -Echo 08/16/14 >> mild LVH, EF 60 to 67%, diastolic dysfx  -Labs 12/45/80 >> ANA negative, RF 32, anti-CCP negative, SS-A/SS-B negative, SCL-70 negative  -PFT 08/17/14 >> FEV1 1.75 (115%), FEV1% 84, TLC 3.39 (73%), DLCO 45% - 08/31/14   Walked RA x 3 laps @ 185 ft each stopped due to  92% slow pace @ 40 mg per day> rec reduce to 20 mg daily  - 09/28/2014  Walked RA x 3 laps @ 185 ft each stopped due to  Slow to mod pace, some sob at end but sats 94%   Does appear to have a steroid resp form of ILD/ arthritis.  The goal with a chronic steroid dependent illness is always arriving at the lowest effective dose that controls the disease/symptoms and not accepting a set "formula" which is based on statistics or guidelines that don't always take into account patient  variability or the natural hx of the dz in every individual patient, which may well vary over time.  For now therefore I recommend the patient maintain  A new floor of 10 mg pred per day

## 2014-09-28 NOTE — Patient Instructions (Addendum)
Reduce prednisone to 10 mg each am   Please schedule a follow up office visit in 6 weeks, call sooner if decline in activity tolerance due to breathing

## 2014-09-28 NOTE — Progress Notes (Signed)
57 yowf never smoker referred by Dr Brigitte Pulse for eval of new onset sob early 2012   November 28, 2010 1st pulmonary office eval for new onset doe x across the room, abupt onset right after NY's, assoc with increase cough getting better and chest tightness assoc also with nasal congestion.. sleeping better most of the nights without noct exac or early am excess mucus production.  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 Connective tissue dz related  Chief Complaint  Patient presents with  . HFU    Pt states that her breathing has improved, but not back to baseline. No new co's today.   improved to point where could do HT on day of ov on RA Prednisone 2 with breakfast rec Nexium should be 40 mg daily automatically  Take 30-60 min before first meal of the day and pepcid ac 20 mg at  Bedtime until you return  Reduce prednisone to 20 mg daily with bfast  GERD diet   09/28/2014 f/u ov/[place] Perra re: PF/ assoc with arthritis/Pos RA/on prednisone new rx 08/24/14   Chief Complaint  Patient presents with  . Follow-up    Pt states that her breathing continues to improve, but not yet back to normal baseline.    can do HT leaning on basket / uses [place] parking / level into house and very sob if carries groceries in vs better   a year ago=  clean house , walk around block, no HC parking  About 50% back to  baseline since  on prednisone  Previously reports short courses of pred helped arthritic symptoms a lot but never on it more than a few weeks and is concerned about staying on it.  No obvious day to day or daytime variabilty or assoc chronic cough or cp or chest tightness, subjective wheeze overt sinus or hb symptoms. No unusual exp hx or h/o childhood pna/ asthma or knowledge of premature birth.  Sleeping ok without nocturnal  or early am exacerbation  of respiratory  c/o's or need for noct saba. Also denies any obvious fluctuation of symptoms with weather or environmental changes or other aggravating or alleviating factors except as outlined above   Current Medications, Allergies, Complete Past Medical History, Past Surgical History, Family History, and Social History were reviewed in [employer] record.  ROS  The following are not active complaints unless bolded sore throat, dysphagia, dental problems, itching, sneezing,  nasal congestion or excess/ purulent secretions, ear ache,   fever, chills, sweats, unintended wt loss, pleuritic or exertional cp, hemoptysis,  orthopnea pnd or leg swelling, presyncope, palpitations, heartburn, abdominal pain, anorexia, nausea, vomiting, diarrhea  or change in bowel or urinary habits, change in stools or urine, dysuria,hematuria,  rash, arthralgias, visual complaints, headache, numbness weakness or ataxia or problems with walking or coordination,  change in mood/affect or memory.  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 Nl external ear canals without cough reflex   NECK :  without JVD/Nodes/TM/ nl carotid upstrokes bilaterally   LUNGS: no acc muscle use,  Bilateral subtle  basilar  insp crackles s cough on insp   CV:  RRR  no s3 or murmur or increase in P2, no edema   ABD:  soft and nontender with nl excursion in the supine position. No bruits or organomegaly, bowel sounds nl  MS:  warm without deformities, calf tenderness, cyanosis or clubbing  SKIN: warm and dry without lesions    NEURO:  alert, approp, no deficits    08/18/14 cxr Stable interstitial densities are noted throughout both lungs  consistent with scarring or fibrosis. No significant changes noted  compared to prior exam.

## 2014-10-19 ENCOUNTER — Telehealth: Payer: Self-pay | Admitting: Internal Medicine

## 2014-10-19 NOTE — Telephone Encounter (Signed)
Called and spoke to pt. Pt c/o weakness, dizziness and SOB. Pt also has questions about her pred. Appt made with MW on 10/20/14. Pt verbalized understanding and denied any further questions or concerns at this time.

## 2014-10-20 ENCOUNTER — Encounter: Payer: Self-pay | Admitting: Internal Medicine

## 2014-10-20 ENCOUNTER — Ambulatory Visit (INDEPENDENT_AMBULATORY_CARE_PROVIDER_SITE_OTHER): Payer: Medicare Other | Admitting: Internal Medicine

## 2014-10-20 ENCOUNTER — Other Ambulatory Visit (INDEPENDENT_AMBULATORY_CARE_PROVIDER_SITE_OTHER): Payer: Medicare Other

## 2014-10-20 VITALS — BP 146/68 | HR 100 | Temp 98.0°F | Ht 61.0 in | Wt 160.0 lb

## 2014-10-20 DIAGNOSIS — I2510 Atherosclerotic heart disease of native coronary artery without angina pectoris: Secondary | ICD-10-CM

## 2014-10-20 DIAGNOSIS — I10 Essential (primary) hypertension: Secondary | ICD-10-CM

## 2014-10-20 DIAGNOSIS — J8410 Pulmonary fibrosis, unspecified: Secondary | ICD-10-CM

## 2014-10-20 LAB — BASIC METABOLIC PANEL
BUN: 12 mg/dL (ref 6–23)
CALCIUM: 10.4 mg/dL (ref 8.4–10.5)
CO2: 25 mEq/L (ref 19–32)
Chloride: 100 mEq/L (ref 96–112)
Creatinine, Ser: 0.7 mg/dL (ref 0.4–1.2)
GFR: 79.43 mL/min (ref >60.00)
GLUCOSE: 144 mg/dL — AB (ref 70–99)
Potassium: 4.7 mEq/L (ref 3.5–5.1)
SODIUM: 131 meq/L — AB (ref 135–145)

## 2014-10-20 LAB — CBC WITH DIFFERENTIAL/PLATELET
BASOS ABS: 0 10*3/uL (ref 0.0–0.1)
Basophils Relative: 0.3 % (ref 0.0–3.0)
Eosinophils Absolute: 0 10*3/uL (ref 0.0–0.7)
Eosinophils Relative: 0 % (ref 0.0–5.0)
HCT: 43.5 % (ref 36.0–46.0)
HEMOGLOBIN: 14.5 g/dL (ref 12.0–15.0)
LYMPHS PCT: 11.8 % — AB (ref 12.0–46.0)
Lymphs Abs: 1.1 10*3/uL (ref 0.7–4.0)
MCHC: 33.3 g/dL (ref 30.0–36.0)
MCV: 93.7 fl (ref 78.0–100.0)
MONOS PCT: 2.6 % — AB (ref 3.0–12.0)
Monocytes Absolute: 0.2 10*3/uL (ref 0.1–1.0)
NEUTROS ABS: 7.7 10*3/uL (ref 1.4–7.7)
NEUTROS PCT: 85.3 % — AB (ref 43.0–77.0)
Platelets: 262 10*3/uL (ref 150.0–400.0)
RBC: 4.64 Mil/uL (ref 3.87–5.11)
RDW: 16.2 % — ABNORMAL HIGH (ref 11.5–15.5)
WBC: 9 10*3/uL (ref 4.0–10.5)

## 2014-10-20 LAB — SEDIMENTATION RATE: SED RATE: 21 mm/h (ref 0–22)

## 2014-10-20 NOTE — Patient Instructions (Addendum)
No change prednisone pending labs but hold the lasix until swelling gets noticeable and take it one daily if note swelling and skip it if no swelling   Please remember to go to the lab    department downstairs for your tests - we will call you with the results when they are available.  Keep previous appt for follow up

## 2014-10-20 NOTE — Progress Notes (Signed)
5 yowf never smoker referred by Dr Brigitte Pulse for eval of new onset sob early 2012 with clear evidence of pulmonary fibrosis ? Related to connective tissue dz 08/2014    History of Present Illness  November 28, 2010 1st pulmonary office eval for new onset doe x across the room, abupt onset right after [place]'s, assoc with increase cough getting better and chest tightness assoc also with nasal congestion.. sleeping better most of the nights without noct exac or early am excess mucus production.  rec No evidence asthma copd  Try rx for gerd and off all resp meds and f/u in 2 weeks if not better > did not return   Date of Admission: 08/15/2014  Date of Discharge: 08/20/2014   Consults:  Pulmonology- Christinia Gully, MD  Cardiology- Peter Martinique, MD  Discharge Diagnoses:  Acute respiratory failure with hypoxemia  Interstitial Lung Disease/Postinflammatory pulmonary fibrosis > started steroids 08/16/14  Essential hypertension  Dyspnea  Diastolic dysfunction with acute on chronic heart failure (EF 60-65%)  Coronary artery disease  Leukocytosis  Protein-calorie malnutrition  Hyponatremia    08/31/2014 post hosp f/u ov/Josetta Wigal re: pf/ ? Connective tissue dz related  Chief Complaint  Patient presents with  . HFU    Pt states that her breathing has improved, but not back to baseline. No new co's today.   improved to point where could do HT on day of ov on RA Prednisone 2 with breakfast rec Nexium should be 40 mg daily automatically  Take 30-60 min before first meal of the day and pepcid ac 20 mg at  Bedtime until you return  Reduce prednisone to 20 mg daily with bfast  GERD diet   09/28/2014 f/u ov/Verdia Bolt re: PF/ assoc with arthritis/Pos RA/on prednisone new rx 08/24/14   Chief Complaint  Patient presents with  . Follow-up    Pt states that her breathing continues to improve, but not yet back to normal baseline.    can do HT leaning on basket / uses [place] parking / level into house and very sob if  carries groceries in vs better   a year ago=  clean house , walk around block, no HC parking  About 50% back to baseline since  on prednisone  Previously reports short courses of pred helped arthritic symptoms a lot but never on it more than a few weeks and is concerned about staying on it. rec Reduce prednisone to 10 mg each am    10/20/2014 f/u ov/Marcio Hoque re: pred 10 mg daily for PF related to ? RA  Chief Complaint  Patient presents with  . Acute Visit    Pt c/o weakness, dizziness and SOB for the past 6 days. She is also c/o prod cough with thick, clear sputum.    Not really sure breathing is worse so much as her fatigue is and this correlates best with resolution of chronic leg swelling while on lasix, her doe and arthritis may are really not changed on 20 vs 10 of prednisone.   No obvious day to day or daytime variabilty or assoc  cp or chest tightness, subjective wheeze overt sinus or hb symptoms. No unusual exp hx or h/o childhood pna/ asthma or knowledge of premature birth.  Sleeping ok without nocturnal  or early am exacerbation  of respiratory  c/o's or need for noct saba. Also denies any obvious fluctuation of symptoms with weather or environmental changes or other aggravating or alleviating factors except as outlined above   Current Medications, Allergies,  Complete Past Medical History, Past Surgical History, Family History, and Social History were reviewed in [employer] record.  ROS  The following are not active complaints unless bolded sore throat, dysphagia, dental problems, itching, sneezing,  nasal congestion or excess/ purulent secretions, ear ache,   fever, chills, sweats, unintended wt loss, pleuritic or exertional cp, hemoptysis,  orthopnea pnd or leg swelling, presyncope, palpitations, heartburn, abdominal pain, anorexia, nausea, vomiting, diarrhea  or change in bowel or urinary habits, change in stools or urine, dysuria,hematuria,  rash, arthralgias,  visual complaints, headache, numbness weakness or ataxia or problems with walking or coordination,  change in mood/affect or memory.                        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 Nl external ear canals without cough reflex   NECK :  without JVD/Nodes/TM/ nl carotid upstrokes bilaterally   LUNGS: no acc muscle use,  Bilateral very  subtle  basilar  insp crackles s cough on insp   CV:  RRR  no s3 or murmur or increase in P2, no edema   ABD:  soft and nontender with nl excursion in the supine position. No bruits or organomegaly, bowel sounds nl  MS:  warm without deformities, calf tenderness, cyanosis or clubbing  SKIN: warm and dry without lesions    NEURO:  alert, approp, no deficits    08/18/14 cxr Stable interstitial densities are noted throughout both lungs  consistent with scarring or fibrosis. No significant changes noted  compared to prior exam.   Recent Labs Lab 10/20/14 1534  NA 131*  K 4.7  CL 100  CO2 25  BUN 12  CREATININE 0.7  GLUCOSE 144*    Recent Labs Lab 10/20/14 1534  HGB 14.5  HCT 43.5  WBC 9.0  PLT 262.0     Lab Results  Component Value Date   TSH 0.589 08/16/2014     Lab Results  Component Value Date   PROBNP 802.7* 08/17/2014     Lab Results  Component Value Date   ESRSEDRATE 21 10/20/2014

## 2014-10-20 NOTE — Progress Notes (Signed)
Quick Note:  Spoke with pt and notified of results per Dr. Wert. Pt verbalized understanding and denied any questions.  ______ 

## 2014-10-21 NOTE — Assessment & Plan Note (Signed)
PFT's 03/10/10 FEV1 1.68 (130%) ratio 74 , DLCO 48% (vs 45% 04/22/07) corrects to 60%  See inpt adm 08/15/14  -CT 08/15/14 >> moderate CAD, UIP pattern of pulmonary fibrosis with b/l areas of GGO, BTX RUL  -ESR 08/16/14 >> 67 > started systemic steroids as inpt -Echo 08/16/14 >> mild LVH, EF 60 to 84%, diastolic dysfx  -Labs 21/03/12 >> ANA negative, RF 32, anti-CCP negative, SS-A/SS-B negative, SCL-70 negative  -PFT 08/17/14 >> FEV1 1.75 (115%), FEV1% 84, TLC 3.39 (73%), DLCO 45% - 08/31/14   Walked RA x 3 laps @ 185 ft each stopped due to  92% slow pace @ 40 mg per day> rec reduce to 20 mg daily  - 09/28/2014  Walked RA x 3 laps @ 185 ft each stopped due to  Slow to mod pace, some sob at end but sats 94%  - reduced pred to 10 mg daily 09/28/2014 > worse starting 10/14/14 so recheck esr 10/20/2014  - 10/20/2014  Walked RA x 3 laps @ 185 ft each stopped due to end of study, slow to mod pace, no sob or desats on 10 mg daily pred   I had an extended discussion with the patient reviewing all relevant studies completed to date and  lasting 15 to 20 minutes of a 25 minute visit on the following ongoing concerns:  The goal with a chronic steroid dependent illness is always arriving at the lowest effective dose that controls the disease/symptoms and not accepting a set "formula" which is based on statistics or guidelines that don't always take into account patient  variability or the natural hx of the dz in every individual patient, which may well vary over time.  For now therefore I recommend the patient maintain  10 mg per day through the holidays and consider taper next ov

## 2014-10-21 NOTE — Assessment & Plan Note (Signed)
Her present symptoms correlate with the addition of daily lasix to her regimen and note her bmet is still ok though the na is a bit low  rec for now until we sort out why she feels weak she just dose the lasix prn swelling

## 2014-10-26 DIAGNOSIS — Z0000 Encounter for general adult medical examination without abnormal findings: Secondary | ICD-10-CM | POA: Diagnosis not present

## 2014-10-26 DIAGNOSIS — M859 Disorder of bone density and structure, unspecified: Secondary | ICD-10-CM | POA: Diagnosis not present

## 2014-10-26 DIAGNOSIS — M8580 Other specified disorders of bone density and structure, unspecified site: Secondary | ICD-10-CM | POA: Diagnosis not present

## 2014-10-26 DIAGNOSIS — R7301 Impaired fasting glucose: Secondary | ICD-10-CM | POA: Diagnosis not present

## 2014-10-26 DIAGNOSIS — I10 Essential (primary) hypertension: Secondary | ICD-10-CM | POA: Diagnosis not present

## 2014-11-09 ENCOUNTER — Ambulatory Visit (INDEPENDENT_AMBULATORY_CARE_PROVIDER_SITE_OTHER)
Admission: RE | Admit: 2014-11-09 | Discharge: 2014-11-09 | Disposition: A | Discharge status: Home / Self Care | Payer: Medicare Other | Source: Ambulatory Visit | Attending: Internal Medicine | Admitting: Internal Medicine

## 2014-11-09 ENCOUNTER — Encounter: Payer: Self-pay | Admitting: Internal Medicine

## 2014-11-09 ENCOUNTER — Ambulatory Visit (INDEPENDENT_AMBULATORY_CARE_PROVIDER_SITE_OTHER): Payer: Medicare Other | Admitting: Internal Medicine

## 2014-11-09 VITALS — BP 148/62 | HR 91 | Temp 97.8°F | Ht 61.0 in | Wt 162.0 lb

## 2014-11-09 DIAGNOSIS — R0602 Shortness of breath: Secondary | ICD-10-CM | POA: Diagnosis not present

## 2014-11-09 DIAGNOSIS — J8410 Pulmonary fibrosis, unspecified: Secondary | ICD-10-CM | POA: Diagnosis not present

## 2014-11-09 NOTE — Progress Notes (Signed)
43 yowf never smoker referred by Dr Brigitte Pulse for eval of new onset sob early 2012 with clear evidence of pulmonary fibrosis ? Related to connective tissue dz 08/2014    History of Present Illness  November 28, 2010 1st pulmonary office eval for new onset doe x across the room, abupt onset right after NY's, assoc with increase cough getting better and chest tightness assoc also with nasal congestion.. sleeping better most of the nights without noct exac or early am excess mucus production.  rec No evidence asthma copd  Try rx for gerd and off all resp meds and f/u in 2 weeks if not better > did not return   Date of Admission: 08/15/2014  Date of Discharge: 08/20/2014   Consults:  Pulmonology- Maria Gully, MD  Cardiology- Peter Martinique, MD  Discharge Diagnoses:  Acute respiratory failure with hypoxemia  Interstitial Lung Disease/Postinflammatory pulmonary fibrosis > started steroids 08/16/14  Essential hypertension  Dyspnea  Diastolic dysfunction with acute on chronic heart failure (EF 60-65%)  Coronary artery disease  Leukocytosis  Protein-calorie malnutrition  Hyponatremia    08/31/2014 post hosp f/u ov/[name] re: pf/ ? Connective tissue dz related  Chief Complaint  Patient presents with  . HFU    Pt states that her breathing has improved, but not back to baseline. No new co's today.   improved to point where could do HT on day of ov on RA Prednisone 2 with breakfast rec Nexium should be 40 mg daily automatically  Take 30-60 min before first meal of the day and pepcid ac 20 mg at  Bedtime until you return  Reduce prednisone to 20 mg daily with bfast  GERD diet   09/28/2014 f/u ov/[name] re: PF/ assoc with arthritis/Pos RA/on prednisone new rx 08/24/14   Chief Complaint  Patient presents with  . Follow-up    Pt states that her breathing continues to improve, but not yet back to normal baseline.    can do HT leaning on basket / uses [place] parking / level into house and very sob if  carries groceries in vs better   a year ago=  clean house , walk around block, no [facility] parking  About 50% back to baseline since  on prednisone  Previously reports short courses of pred helped arthritic symptoms a lot but never on it more than a few weeks and is concerned about staying on it. rec Reduce prednisone to 10 mg each am    10/20/2014 f/u ov/[name] re: pred 10 mg daily for PF related to ? RA  Chief Complaint  Patient presents with  . Acute Visit    Pt c/o weakness, dizziness and SOB for the past 6 days. She is also c/o prod cough with thick, clear sputum.    Not really sure breathing is worse so much as her fatigue is and this correlates best with resolution of chronic leg swelling while on lasix, her doe and arthritis may are really not changed on 20 vs 10 of prednisone.  rec No change prednisone pending labs but hold the lasix until swelling gets noticeable and take it  As needed .   11/09/2014 f/u ov/[name] re: pf/ ? Assoc connective tissue dz  On 10 mg pred daily  Chief Complaint  Patient presents with  . Follow-up    Pt states that she is unchanged. She feels like she is having some increased nasal and chest congestion, has to clear her throat alot.       No obvious  day to day or daytime variabilty or assoc  Limiting sob or cp or chest tightness, subjective wheeze overt sinus or hb symptoms. No unusual exp hx or h/o childhood pna/ asthma or knowledge of premature birth.  Sleeping ok without nocturnal  or early am exacerbation  of respiratory  c/o's or need for noct saba. Also denies any obvious fluctuation of symptoms with weather or environmental changes or other aggravating or alleviating factors except as outlined above   Current Medications, Allergies, Complete Past Medical History, Past Surgical History, Family History, and Social History were reviewed in [facility] record.  ROS  The following are not active complaints unless bolded sore throat,  dysphagia, dental problems, itching, sneezing,  nasal congestion or excess/ purulent secretions, ear ache,   fever, chills, sweats, unintended wt loss, pleuritic or exertional cp, hemoptysis,  orthopnea pnd or leg swelling, presyncope, palpitations, heartburn, abdominal pain, anorexia, nausea, vomiting, diarrhea  or change in bowel or urinary habits, change in stools or urine, dysuria,hematuria,  rash, arthralgias, visual complaints, headache, numbness weakness or ataxia or problems with walking or coordination,  change in mood/affect or memory.                  Past Surgical History:  Hysterectomy  Cholecystectomy mid 90's  Radial rt mastectomy 1969 - never chemo   Family History:  Emphysema- Brother  Heart dz- Both Parents  Bladder CA- Mother  Esophageal CA- Brother  Prostate CA- Brother   Social History:  Married  No children  Retired Web designer  Never smoker  ETOH rare    :      Pex  amb wf nad  09/28/2014    160  Vs 10/20/2014  160 > 11/09/2014 162  Wt Readings from Last 3 Encounters:  08/31/14 158 lb (71.668 kg)  08/15/14 159 lb 2.8 oz (72.2 kg)  08/07/14 162 lb (73.483 kg)     HEENT: nl dentition, turbinates, and orophanx. Nl external ear canals without cough reflex   NECK :  without JVD/Nodes/TM/ nl carotid upstrokes bilaterally   LUNGS: no acc muscle use,  Bilateral very  subtle  basilar  insp crackles s cough on insp   CV:  RRR  no s3 or murmur or increase in P2, no edema   ABD:  soft and nontender with nl excursion in the supine position. No bruits or organomegaly, bowel sounds nl  MS:  warm without deformities, calf tenderness, cyanosis or clubbing  SKIN: warm and dry without lesions    NEURO:  alert, approp, no deficits      Recent Labs Lab 10/20/14 1534  NA 131*  K 4.7  CL 100  CO2 25  BUN 12  CREATININE 0.7  GLUCOSE 144*    Recent Labs Lab 10/20/14 1534  HGB 14.5  HCT 43.5  WBC 9.0  PLT 262.0     Lab  Results  Component Value Date   TSH 0.589 08/16/2014     Lab Results  Component Value Date   PROBNP 802.7* 08/17/2014     Lab Results  Component Value Date   ESRSEDRATE 21 10/20/2014      cxr 11/09/14  Chronic interstitial changes/fibrosis particularly at lung bases with chronic RIGHT upper lobe and minimal RIGHT basilar scarring. No acute abnormalities

## 2014-11-09 NOTE — Patient Instructions (Signed)
Try prednisone 10 mg one half  daily at breakfast  Continue nexium 40 mg Take 30-60 min before first meal of the day and Pepcid 20 mg at supper   Please remember to go to the x-ray department downstairs for your tests - we will call you with the results when they are available.  Please schedule a follow up office visit in 6 weeks, call sooner if needed - if worse increase prednisone to 10 mg daily

## 2014-11-10 NOTE — Progress Notes (Signed)
Quick Note:  Spoke with pt and notified of results per Dr. Wert. Pt verbalized understanding and denied any questions.  ______ 

## 2014-11-10 NOTE — Assessment & Plan Note (Addendum)
PFT's 03/10/10 FEV1 1.68 (130%) ratio 74 , DLCO 48% (vs 45% 04/22/07) corrects to 60%  See inpt adm 08/15/14  -CT 08/15/14 >> moderate CAD, UIP pattern of pulmonary fibrosis with b/l areas of GGO, BTX RUL  -ESR 08/16/14 >> 67 > started systemic steroids as inpt -Echo 08/16/14 >> mild LVH, EF 60 to 41%, diastolic dysfx  -Labs 93/79/02 >> ANA negative, RF 32, anti-CCP negative, SS-A/SS-B negative, SCL-70 negative  -PFT 08/17/14 >> FEV1 1.75 (115%), FEV1% 84, TLC 3.39 (73%), DLCO 45% - 08/31/14   Walked RA x 3 laps @ 185 ft each stopped due to  92% slow pace @ 40 mg per day> rec reduce to 20 mg daily  - 09/28/2014  Walked RA x 3 laps @ 185 ft each stopped due to  Slow to mod pace, some sob at end but sats 94%  - reduced pred to 10 mg daily 09/28/2014 > worse starting 10/14/14 so recheck esr 10/20/2014  - 10/20/2014  Walked RA x 3 laps @ 185 ft each stopped due to end of study, slow to mod pace, no sob or desats on 10 mg daily pred - 11/09/2014   Trial of prednisone 5 mg daily   I had an extended discussion with the patient reviewing all relevant studies completed to date and  lasting 15 to 20 minutes of a 25 minute visit on the following ongoing concerns: The goal with a chronic steroid dependent illness is always arriving at the lowest effective dose that controls the disease/symptoms and not accepting a set "formula" which is based on statistics or guidelines that don't always take into account patient  variability or the natural hx of the dz in every individual patient, which may well vary over time.  For now therefore I recommend the patient maintain  A floor of 5 mg daily but a ceiling for now of 10 mg daily if noted any flare of cough , sob or arthritis all which tend to correlate with one another and clinical worsening of pf

## 2014-11-11 DIAGNOSIS — I2510 Atherosclerotic heart disease of native coronary artery without angina pectoris: Secondary | ICD-10-CM | POA: Diagnosis not present

## 2014-11-11 DIAGNOSIS — J849 Interstitial pulmonary disease, unspecified: Secondary | ICD-10-CM | POA: Diagnosis not present

## 2014-11-11 DIAGNOSIS — R7301 Impaired fasting glucose: Secondary | ICD-10-CM | POA: Diagnosis not present

## 2014-11-11 DIAGNOSIS — J45909 Unspecified asthma, uncomplicated: Secondary | ICD-10-CM | POA: Diagnosis not present

## 2014-11-11 DIAGNOSIS — J8410 Pulmonary fibrosis, unspecified: Secondary | ICD-10-CM | POA: Diagnosis not present

## 2014-11-11 DIAGNOSIS — E785 Hyperlipidemia, unspecified: Secondary | ICD-10-CM | POA: Diagnosis not present

## 2014-11-11 DIAGNOSIS — I10 Essential (primary) hypertension: Secondary | ICD-10-CM | POA: Diagnosis not present

## 2014-11-11 DIAGNOSIS — Z0000 Encounter for general adult medical examination without abnormal findings: Secondary | ICD-10-CM | POA: Diagnosis not present

## 2014-11-11 DIAGNOSIS — F329 Major depressive disorder, single episode, unspecified: Secondary | ICD-10-CM | POA: Diagnosis not present

## 2014-11-12 DIAGNOSIS — Z1212 Encounter for screening for malignant neoplasm of rectum: Secondary | ICD-10-CM | POA: Diagnosis not present

## 2014-11-20 ENCOUNTER — Encounter: Payer: Self-pay | Admitting: Cardiology

## 2014-12-09 DIAGNOSIS — M859 Disorder of bone density and structure, unspecified: Secondary | ICD-10-CM | POA: Diagnosis not present

## 2014-12-21 ENCOUNTER — Ambulatory Visit: Payer: Medicare Other | Admitting: Internal Medicine

## 2014-12-23 NOTE — Progress Notes (Signed)
HPI: FU dyspnea. Patient also with history of pulmonary fibrosis. Admitted 10/15 with worsening dyspnea. Echo 10/15 showed normal LV function, mild LVH, grade 1 diastolic dysfunction, mild to moderate AI, mild LAE, trace MR. Chest CT 10/15 showed no pulmonary embolus, 3 vessel CAD, severe ILD. Patient treated with steroids and antibiotics with improvement; also treated with diuretics for diastolic CHF (proBNP 7619). Treated medically for presumed CAD based on CT results. Since last seen, she describes increased dyspnea on exertion. No orthopnea, PND, chest pain or syncope. Minimal pedal edema. Note her Lasix was discontinued previously and she has had some problems with incontinence.  Current Outpatient Prescriptions  Medication Sig Dispense Refill  . acetaminophen (TYLENOL 8 HOUR) 650 MG CR tablet Take 650 mg by mouth every 8 (eight) hours as needed.     Marland Kitchen aspirin EC 325 MG EC tablet Take 1 tablet (325 mg total) by mouth daily. 30 tablet 0  . atorvastatin (LIPITOR) 20 MG tablet Take 1 tablet (20 mg total) by mouth daily at 6 PM. 30 tablet 6  . esomeprazole (NEXIUM) 20 MG capsule Take 40 mg by mouth 2 (two) times daily before a meal.    . fluticasone (FLONASE) 50 MCG/ACT nasal spray Place 2 sprays into both nostrils once as needed for allergies or rhinitis.    Marland Kitchen guaiFENesin (MUCINEX) 600 MG 12 hr tablet Take 1 tablet (600 mg total) by mouth 2 (two) times daily.    Marland Kitchen losartan (COZAAR) 100 MG tablet Take 1 tablet (100 mg total) by mouth daily. 30 tablet 6  . predniSONE (DELTASONE) 10 MG tablet Take  1 each am 100 tablet 0  . sertraline (ZOLOFT) 100 MG tablet Take 100 mg by mouth daily.     . sodium chloride (OCEAN) 0.65 % SOLN nasal spray Place 1 spray into both nostrils 2 (two) times daily.  0  . traMADol (ULTRAM) 50 MG tablet Take 50 mg by mouth every 6 (six) hours as needed for moderate pain.      No current facility-administered medications for this visit.     Past Medical History    Diagnosis Date  . Dyspnea   . Diverticulosis 2008  . Internal hemorrhoids   . Esophageal stricture   . Arthritis   . Breast cancer 1969  . GERD (gastroesophageal reflux disease)   . Gallstones   . Depression   . HLD (hyperlipidemia)   . HTN (hypertension)   . Obesity   . Colon polyp 2013    TUBULAR ADENOMA (X1  . Pulmonary fibrosis   . CAD (coronary artery disease)     Past Surgical History  Procedure Laterality Date  . Abdominal hysterectomy    . Cholecystectomy    . Mastectomy      right  . Appendectomy    . Foot surgery    . Breast surgery      History   Social History  . Marital Status: Married    Spouse Name: N/A  . Number of Children: 0  . Years of Education: N/A   Occupational History  . retired    Social History Main Topics  . Smoking status: Never Smoker   . Smokeless tobacco: Never Used  . Alcohol Use: 4.2 oz/week    7 Glasses of wine per week     Comment: ocaasional  . Drug Use: No  . Sexual Activity: Not on file   Other Topics Concern  . Not on file   Social History  Narrative    ROS: no fevers or chills, productive cough, hemoptysis, dysphasia, odynophagia, melena, hematochezia, dysuria, hematuria, rash, seizure activity, orthopnea, PND, pedal edema, claudication. Remaining systems are negative.  Physical Exam: Well-developed well-nourished in no acute distress.  Skin is warm and dry.  HEENT is normal.  Neck is supple.  Chest with basilar crackles Cardiovascular exam is regular rate and rhythm.  Abdominal exam nontender or distended. No masses palpated. Extremities show no edema. neuro grossly intact  ECG 09/17/2014-sinus rhythm, RV conduction delay, poor R-wave progression.

## 2014-12-25 ENCOUNTER — Encounter: Payer: Self-pay | Admitting: *Deleted

## 2014-12-25 ENCOUNTER — Encounter: Payer: Self-pay | Admitting: Cardiology

## 2014-12-25 ENCOUNTER — Ambulatory Visit (INDEPENDENT_AMBULATORY_CARE_PROVIDER_SITE_OTHER): Payer: Medicare Other | Admitting: Cardiology

## 2014-12-25 VITALS — BP 140/70 | HR 96 | Ht 61.0 in | Wt 161.4 lb

## 2014-12-25 DIAGNOSIS — J8410 Pulmonary fibrosis, unspecified: Secondary | ICD-10-CM

## 2014-12-25 DIAGNOSIS — E785 Hyperlipidemia, unspecified: Secondary | ICD-10-CM

## 2014-12-25 DIAGNOSIS — R0600 Dyspnea, unspecified: Secondary | ICD-10-CM | POA: Diagnosis not present

## 2014-12-25 DIAGNOSIS — I10 Essential (primary) hypertension: Secondary | ICD-10-CM | POA: Diagnosis not present

## 2014-12-25 MED ORDER — FUROSEMIDE 20 MG PO TABS: 20.0000 mg | ORAL_TABLET | Freq: Every day | ORAL | Status: DC

## 2014-12-25 NOTE — Assessment & Plan Note (Signed)
Patient complains of increased dyspnea on exertion. This may be secondary to pulmonary fibrosis but she does have a history of diastolic dysfunction. Add Lasix 20 mg daily to see if this improves her symptoms. In one week check potassium, renal function and BNP.

## 2014-12-25 NOTE — Assessment & Plan Note (Signed)
Continue aspirin and statin. I am not sure if ischemia is contributing to her dyspnea. I think more likely her dyspnea is secondary to pulmonary fibrosis. I will schedule a nuclear study to screen for significant ischemia.

## 2014-12-25 NOTE — Assessment & Plan Note (Signed)
Continue present blood pressure medications. 

## 2014-12-25 NOTE — Assessment & Plan Note (Signed)
Followup pulmonary. 

## 2014-12-25 NOTE — Patient Instructions (Signed)
Your physician recommends that you schedule a follow-up appointment in: [place] has requested that you have a lexiscan myoview. For further information please visit [email or web address]. Please follow instruction sheet, as given.   START FUROSEMIDE 20 MG ONCE DAILY  Your physician recommends that you return for lab work in: [place]

## 2014-12-25 NOTE — Assessment & Plan Note (Signed)
Continue statin. 

## 2014-12-30 ENCOUNTER — Encounter: Payer: Self-pay | Admitting: Internal Medicine

## 2014-12-30 ENCOUNTER — Ambulatory Visit (INDEPENDENT_AMBULATORY_CARE_PROVIDER_SITE_OTHER): Payer: Medicare Other | Admitting: Internal Medicine

## 2014-12-30 VITALS — BP 132/60 | HR 92 | Ht 61.0 in | Wt 160.0 lb

## 2014-12-30 DIAGNOSIS — J8410 Pulmonary fibrosis, unspecified: Secondary | ICD-10-CM

## 2014-12-30 MED ORDER — PREDNISONE 10 MG PO TABS: ORAL_TABLET | ORAL | Status: DC

## 2014-12-30 NOTE — Patient Instructions (Addendum)
Ok to maintain predisone at 10 mg per day since this dose helps arthritis and therefore probably more than adequate to cover lung inflammation  I am deferring whether to refer you to rheumatology to Dr Raul Del capable hands  Please schedule a follow up visit in 3 months but call sooner if needed

## 2014-12-30 NOTE — Progress Notes (Signed)
86 yowf never smoker referred by Dr Shaw for eval of new onset sob early 2012 with clear evidence of pulmonary fibrosis ? Related to connective tissue dz 08/2014    History of Present Illness  November 28, 2010 1st pulmonary office eval for new onset doe x across the room, abupt onset right after [place]'s, assoc with increase cough getting better and chest tightness assoc also with nasal congestion.. sleeping better most of the nights without noct exac or early am excess mucus production.  rec No evidence asthma copd  Try rx for gerd and off all resp meds and f/u in 2 weeks if not better > did not return   Date of Admission: 08/15/2014  Date of Discharge: 08/20/2014   Consults:  Pulmonology- Latoi Giraldo, MD  Cardiology- Peter Jordan, MD  Discharge Diagnoses:  Acute respiratory failure with hypoxemia  Interstitial Lung Disease/Postinflammatory pulmonary fibrosis > started steroids 08/16/14  Essential hypertension  Dyspnea  Diastolic dysfunction with acute on chronic heart failure (EF 60-65%)  Coronary artery disease  Leukocytosis  Protein-calorie malnutrition  Hyponatremia    08/31/2014 post hosp f/u ov/Billyjoe Go re: pf/ ? Connective tissue dz related  Chief Complaint  Patient presents with  . HFU    Pt states that her breathing has improved, but not back to baseline. No new co's today.   improved to point where could do HT on day of ov on RA Prednisone 2 with breakfast rec Nexium should be 40 mg daily automatically  Take 30-60 min before first meal of the day and pepcid ac 20 mg at  Bedtime until you return  Reduce prednisone to 20 mg daily with bfast  GERD diet   09/28/2014 f/u ov/Imelda Dandridge re: PF/ assoc with arthritis/Pos RA/on prednisone new rx 08/24/14   Chief Complaint  Patient presents with  . Follow-up    Pt states that her breathing continues to improve, but not yet back to normal baseline.    can do HT leaning on basket / uses [facility] parking / level into house and very sob if  carries groceries in vs better   a year ago=  clean house , walk around block, no [facility] parking  About 50% back to baseline since  on prednisone  Previously reports short courses of pred helped arthritic symptoms a lot but never on it more than a few weeks and is concerned about staying on it. rec Reduce prednisone to 10 mg each am    10/20/2014 f/u ov/Daizee Firmin re: pred 10 mg daily for PF related to ? RA  Chief Complaint  Patient presents with  . Acute Visit    Pt c/o weakness, dizziness and SOB for the past 6 days. She is also c/o prod cough with thick, clear sputum.    Not really sure breathing is worse so much as her fatigue is and this correlates best with resolution of chronic leg swelling while on lasix, her doe and arthritis may are really not changed on 20 vs 10 of prednisone.  rec No change prednisone pending labs but hold the lasix until swelling gets noticeable and take it  As needed .   11/09/2014 f/u ov/Shantell Belongia re: pf/ ? Assoc connective tissue dz  On 10 mg pred daily  Chief Complaint  Patient presents with  . Follow-up    Pt states that she is unchanged. She feels like she is having some increased nasal and chest congestion, has to clear her throat alot.     rec Try prednisone 10   mg one half  daily at breakfast Continue nexium 40 mg Take 30-60 min before first meal of the day and Pepcid 20 mg at supper  Please remember to go to the x-ray department downstairs for your tests - we will call you with the results when they are available. Please schedule a follow up office visit in 6 weeks, call sooner if needed - if worse increase prednisone to 10 mg daily       12/30/2014 f/u ov/Mollyann Halbert re: sob Chief Complaint  Patient presents with  . Follow-up    Pt states that her congestion seems to be better. Her breathing is overall doing well. No new co's today.     Decreased lasix by half, increased prednisone to 10 mg daily after back pain / hands pain increased on the lower dose but not  sob/cough at lower dose   No obvious day to day or daytime variabilty or assoc  Limiting sob or cp or chest tightness, subjective wheeze overt sinus or hb symptoms. No unusual exp hx or h/o childhood pna/ asthma or knowledge of premature birth.  Sleeping ok without nocturnal  or early am exacerbation  of respiratory  c/o's or need for noct saba. Also denies any obvious fluctuation of symptoms with weather or environmental changes or other aggravating or alleviating factors except as outlined above   Current Medications, Allergies, Complete Past Medical History, Past Surgical History, Family History, and Social History were reviewed in [employer] [facility] record.  ROS  The following are not active complaints unless bolded sore throat, dysphagia, dental problems, itching, sneezing,  nasal congestion or excess/ purulent secretions, ear ache,   fever, chills, sweats, unintended wt loss, pleuritic or exertional cp, hemoptysis,  orthopnea pnd or leg swelling, presyncope, palpitations, heartburn, abdominal pain, anorexia, nausea, vomiting, diarrhea  or change in bowel or urinary habits, change in stools or urine, dysuria,hematuria,  rash, arthralgias, visual complaints, headache, numbness weakness or ataxia or problems with walking or coordination,  change in mood/affect or memory.                  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 Nl external ear canals without cough reflex   NECK :  without JVD/Nodes/TM/ nl carotid upstrokes bilaterally   LUNGS: no acc muscle use,  Bilateral very  subtle  basilar  insp crackles s cough on insp   CV:  RRR  no s3 or murmur or increase in P2, no edema   ABD:  soft and nontender with nl excursion in the supine position. No bruits or organomegaly, bowel sounds nl  MS:  warm without deformities, calf tenderness, cyanosis or clubbing  SKIN: warm and dry without lesions    NEURO:  alert, approp, no deficits  Recent Labs Lab 10/20/14 1534  NA 131*  K 4.7  CL 100  CO2 25  BUN 12  CREATININE 0.7  GLUCOSE 144*    Recent Labs Lab 10/20/14 1534  HGB 14.5  HCT 43.5  WBC 9.0  PLT 262.0     Lab Results  Component Value Date   TSH 0.589 08/16/2014     Lab Results  Component Value Date   PROBNP 802.7* 08/17/2014     Lab Results  Component Value Date   ESRSEDRATE 21 10/20/2014      cxr 11/09/14  Chronic interstitial changes/fibrosis particularly at lung bases with chronic RIGHT upper lobe and minimal RIGHT basilar scarring. No acute abnormalities

## 2014-12-31 ENCOUNTER — Encounter: Payer: Self-pay | Admitting: Internal Medicine

## 2014-12-31 NOTE — Assessment & Plan Note (Signed)
PFT's 03/10/10 FEV1 1.68 (130%) ratio 74 , DLCO 48% (vs 45% 04/22/07) corrects to 60%  See inpt adm 08/15/14  -CT 08/15/14 >> moderate CAD, UIP pattern of pulmonary fibrosis with b/l areas of GGO, BTX RUL  -ESR 08/16/14 >> 67 > started systemic steroids as inpt -Echo 08/16/14 >> mild LVH, EF 60 to 02%, diastolic dysfx  -Labs 09/22/34 >> ANA negative, RF 32, anti-CCP negative, SS-A/SS-B negative, SCL-70 negative  -PFT 08/17/14 >> FEV1 1.75 (115%), FEV1% 84, TLC 3.39 (73%), DLCO 45% - 08/31/14   Walked RA x 3 laps @ 185 ft each stopped due to  92% slow pace @ 40 mg per day> rec reduce to 20 mg daily  - 09/28/2014  Walked RA x 3 laps @ 185 ft each stopped due to  Slow to mod pace, some sob at end but sats 94%  - reduced pred to 10 mg daily 09/28/2014 > worse starting 10/14/14 so recheck esr 10/20/2014  - 10/20/2014  Walked RA x 3 laps @ 185 ft each stopped due to end of study, slow to mod pace, no sob or desats on 10 mg daily pred - 11/09/2014   Trial of prednisone 5 mg daily > flared in terms of arthritis > resumed 10 mg per day since around 12/07/14  For now will hold at 10 mg prednisone daily as due to arthrtic symptoms did not tolerate the lower dose and the dose that controls the arthritis is usually more than enough to control the lung inflammatory component in this setting  See instructions for specific recommendations which were reviewed directly with the patient who was given a copy with highlighter outlining the key components.

## 2015-01-07 ENCOUNTER — Telehealth (HOSPITAL_COMMUNITY): Payer: Self-pay

## 2015-01-07 NOTE — Telephone Encounter (Signed)
Encounter complete. 

## 2015-01-12 ENCOUNTER — Ambulatory Visit (HOSPITAL_COMMUNITY)
Admission: RE | Admit: 2015-01-12 | Discharge: 2015-01-12 | Disposition: A | Discharge status: Home / Self Care | Payer: Medicare Other | Source: Ambulatory Visit | Attending: Cardiology | Admitting: Cardiology

## 2015-01-12 DIAGNOSIS — I2510 Atherosclerotic heart disease of native coronary artery without angina pectoris: Secondary | ICD-10-CM | POA: Insufficient documentation

## 2015-01-12 DIAGNOSIS — J8410 Pulmonary fibrosis, unspecified: Secondary | ICD-10-CM | POA: Insufficient documentation

## 2015-01-12 DIAGNOSIS — E663 Overweight: Secondary | ICD-10-CM | POA: Diagnosis not present

## 2015-01-12 DIAGNOSIS — R0600 Dyspnea, unspecified: Secondary | ICD-10-CM

## 2015-01-12 DIAGNOSIS — R0609 Other forms of dyspnea: Secondary | ICD-10-CM | POA: Insufficient documentation

## 2015-01-12 DIAGNOSIS — I10 Essential (primary) hypertension: Secondary | ICD-10-CM | POA: Insufficient documentation

## 2015-01-12 DIAGNOSIS — I509 Heart failure, unspecified: Secondary | ICD-10-CM | POA: Insufficient documentation

## 2015-01-12 DIAGNOSIS — R002 Palpitations: Secondary | ICD-10-CM | POA: Insufficient documentation

## 2015-01-12 DIAGNOSIS — R42 Dizziness and giddiness: Secondary | ICD-10-CM | POA: Diagnosis not present

## 2015-01-12 MED ORDER — REGADENOSON 0.4 MG/5ML IV SOLN
0.4000 mg | Freq: Once | INTRAVENOUS | Status: AC
  Administered 2015-01-12: 0.4 mg via INTRAVENOUS

## 2015-01-12 MED ORDER — TECHNETIUM TC 99M SESTAMIBI GENERIC - CARDIOLITE
32.0000 | Freq: Once | INTRAVENOUS | Status: AC | PRN
  Administered 2015-01-12: 32 via INTRAVENOUS

## 2015-01-12 MED ORDER — TECHNETIUM TC 99M SESTAMIBI GENERIC - CARDIOLITE
11.0000 | Freq: Once | INTRAVENOUS | Status: AC | PRN
  Administered 2015-01-12: 11 via INTRAVENOUS

## 2015-01-12 MED ORDER — AMINOPHYLLINE 25 MG/ML IV SOLN
75.0000 mg | Freq: Once | INTRAVENOUS | Status: AC
  Administered 2015-01-12: 75 mg via INTRAVENOUS

## 2015-01-12 NOTE — Procedures (Addendum)
[place] [address] [place] [address] [phone]  Cardiology Nuclear Med Study  [name] is a 81 y.o. female     MRN : [id number]     DOB: February 10, 1928  Procedure Date: 01/12/2015  Nuclear Med Background Indication for Stress Test:  Evaluation for Ischemia History:  Pulmonary Fibrosis;CAD by CT;CHF;No prior NUC MPI for comparison. Cardiac Risk Factors: Hypertension, Lipids and Overweight  Symptoms:  Dizziness, DOE, Fatigue, Light-Headedness and Palpitations   Nuclear Pre-Procedure Caffeine/Decaff Intake:  1:00am NPO After: 9:00am   IV Site: R Forearm  IV 0.9% NS with Angio Cath:  22g  Chest Size (in):  n/a IV Started by: Rolene Course, RN  Height: 5\' 1"  (1.549 m)  Cup Size: B;Pt is s/p Left partial mastectomy and right radical mastectomy with lymphectomy;Pt states she has been cleared for use of right arm for IV and BP due to her breast CA was from [place].  BMI:  Body mass index is 30.44 kg/(m^2). Weight:  161 lb (73.029 kg)   Tech Comments:  n/a    Nuclear Med Study 1 or 2 day study: 1 day  Stress Test Type:  [place]  Order Authorizing Provider:  Kirk Ruths, MD   Resting Radionuclide: Technetium 95m Sestamibi  Resting Radionuclide Dose: 11.0 mCi   Stress Radionuclide:  Technetium 79m Sestamibi  Stress Radionuclide Dose: 32.0 mCi           Stress Protocol Rest HR: 82 Stress HR: 93  Rest BP: 167/71 Stress BP: 169/65  Exercise Time (min): n/a METS: n/a          Dose of Adenosine (mg):  n/a Dose of Lexiscan: 0.4 mg  Dose of Atropine (mg): n/a Dose of Dobutamine: n/a mcg/kg/min (at max HR)  Stress Test Technologist: Leane Para, CCT Nuclear Technologist: Imagene Riches, CNMT   Rest Procedure:  Myocardial perfusion imaging was performed at rest 45 minutes following the intravenous administration of Technetium 39m Sestamibi. Stress Procedure:  The patient received IV Lexiscan 0.4 mg over  15-seconds.  Technetium 31m Sestamibi injected IV at 30-seconds.  The patient experienced SOB and headache and 75 mg Aminophylline IV was administered.  There were no significant changes with Lexiscan.  Quantitative spect images were obtained after a  45 minute delay.  Transient Ischemic Dilatation (Normal <1.22):  0.87  QGS EDV:  64 ml QGS ESV:  19 ml LV Ejection Fraction: 70%  Rest ECG: NSR - Normal EKG  Stress ECG: No significant change from baseline ECG  QPS Raw Data Images:  There is interference from nuclear activity from structures below the diaphragm. This does not affect the ability to read the study. Stress Images:  Normal homogeneous uptake in all areas of the myocardium. Rest Images:  Normal homogeneous uptake in all areas of the myocardium. Subtraction (SDS):  No evidence of ischemia.  Impression Exercise Capacity:  Lexiscan with no exercise. BP Response:  Normal blood pressure response. Clinical Symptoms:  There is dyspnea. ECG Impression:  No significant ECG changes with Lexiscan. Comparison with Prior Nuclear Study: No previous nuclear study performed  Overall Impression:  Low risk stress nuclear study without reversible ischemia. There is high signal bowel overlying the inferior wall - an inferior perfusion defect is unlikely, but cannot be excluded.  LV Wall Motion:  NL LV Function; NL Wall Motion; LVEF 70%  Pixie Casino, MD, [facility] Board Certified in Nuclear Cardiology Attending Cardiologist [facility]  Pixie Casino, MD  01/12/2015 4:46 PM

## 2015-01-14 DIAGNOSIS — Z79899 Other long term (current) drug therapy: Secondary | ICD-10-CM | POA: Diagnosis not present

## 2015-01-14 DIAGNOSIS — Z6830 Body mass index (BMI) 30.0-30.9, adult: Secondary | ICD-10-CM | POA: Diagnosis not present

## 2015-01-14 DIAGNOSIS — M859 Disorder of bone density and structure, unspecified: Secondary | ICD-10-CM | POA: Diagnosis not present

## 2015-02-09 ENCOUNTER — Other Ambulatory Visit (HOSPITAL_COMMUNITY): Payer: Self-pay | Admitting: Internal Medicine

## 2015-02-09 ENCOUNTER — Ambulatory Visit (HOSPITAL_COMMUNITY)
Admission: RE | Admit: 2015-02-09 | Discharge: 2015-02-09 | Disposition: A | Discharge status: Home / Self Care | Payer: Medicare Other | Source: Ambulatory Visit | Attending: Internal Medicine | Admitting: Internal Medicine

## 2015-02-09 ENCOUNTER — Encounter (HOSPITAL_COMMUNITY): Payer: Self-pay

## 2015-02-09 DIAGNOSIS — M810 Age-related osteoporosis without current pathological fracture: Secondary | ICD-10-CM | POA: Insufficient documentation

## 2015-02-09 MED ORDER — ZOLEDRONIC ACID 5 MG/100ML IV SOLN
5.0000 mg | Freq: Once | INTRAVENOUS | Status: AC
  Administered 2015-02-09: 5 mg via INTRAVENOUS
  Filled 2015-02-09: qty 100

## 2015-02-09 MED ORDER — SODIUM CHLORIDE 0.9 % IV SOLN
Freq: Once | INTRAVENOUS | Status: AC
  Administered 2015-02-09: 14:00:00 via INTRAVENOUS

## 2015-02-09 NOTE — Discharge Instructions (Signed)
Drink  Fluids/water as tolerated over the next 72 hours Tylenol or ibuprofen if needed for aches and pains  Continue Calcium and Vit D as directed by your MD Call MD for any problems or questions         Reclast Zoledronic Acid injection (Paget's Disease, Osteoporosis) What is this medicine? ZOLEDRONIC ACID (ZOE le dron ik AS id) lowers the amount of calcium loss from bone. It is used to treat Paget's disease and osteoporosis in women. This medicine may be used for other purposes; ask your health care provider or pharmacist if you have questions. COMMON BRAND NAME(S): Reclast, Zometa What should I tell my health care provider before I take this medicine? They need to know if you have any of these conditions: -aspirin-sensitive asthma -cancer, especially if you are receiving medicines used to treat cancer -dental disease or wear dentures -infection -kidney disease -low levels of calcium in the blood -past surgery on the parathyroid gland or intestines -receiving corticosteroids like dexamethasone or prednisone -an unusual or allergic reaction to zoledronic acid, other medicines, foods, dyes, or preservatives -pregnant or trying to get pregnant -breast-feeding How should I use this medicine? This medicine is for infusion into a vein. It is given by a health care professional in a hospital or clinic setting. Talk to your pediatrician regarding the use of this medicine in children. This medicine is not approved for use in children. Overdosage: If you think you have taken too much of this medicine contact a poison control center or emergency room at once. NOTE: This medicine is only for you. Do not share this medicine with others. What if I miss a dose? It is important not to miss your dose. Call your doctor or health care professional if you are unable to keep an appointment. What may interact with this medicine? -certain antibiotics given by injection -NSAIDs, medicines for pain  and inflammation, like ibuprofen or naproxen -some diuretics like bumetanide, furosemide -teriparatide This list may not describe all possible interactions. Give your health care provider a list of all the medicines, herbs, non-prescription drugs, or dietary supplements you use. Also tell them if you smoke, drink alcohol, or use illegal drugs. Some items may interact with your medicine. What should I watch for while using this medicine? Visit your doctor or health care professional for regular checkups. It may be some time before you see the benefit from this medicine. Do not stop taking your medicine unless your doctor tells you to. Your doctor may order blood tests or other tests to see how you are doing. Women should inform their doctor if they wish to become pregnant or think they might be pregnant. There is a potential for serious side effects to an unborn child. Talk to your health care professional or pharmacist for more information. You should make sure that you get enough calcium and vitamin D while you are taking this medicine. Discuss the foods you eat and the vitamins you take with your health care professional. Some people who take this medicine have severe bone, joint, and/or muscle pain. This medicine may also increase your risk for jaw problems or a broken thigh bone. Tell your doctor right away if you have severe pain in your jaw, bones, joints, or muscles. Tell your doctor if you have any pain that does not go away or that gets worse. Tell your dentist and dental surgeon that you are taking this medicine. You should not have major dental surgery while on this medicine. See  your dentist to have a dental exam and fix any dental problems before starting this medicine. Take good care of your teeth while on this medicine. Make sure you see your dentist for regular follow-up appointments. What side effects may I notice from receiving this medicine? Side effects that you should report to your  doctor or health care professional as soon as possible: -allergic reactions like skin rash, itching or hives, swelling of the face, lips, or tongue -anxiety, confusion, or depression -breathing problems -changes in vision -eye pain -feeling faint or lightheaded, falls -jaw pain, especially after dental work -mouth sores -muscle cramps, stiffness, or weakness -trouble passing urine or change in the amount of urine Side effects that usually do not require medical attention (report to your doctor or health care professional if they continue or are bothersome): -bone, joint, or muscle pain -constipation -diarrhea -fever -hair loss -irritation at site where injected -loss of appetite -nausea, vomiting -stomach upset -trouble sleeping -trouble swallowing -weak or tired This list may not describe all possible side effects. Call your doctor for medical advice about side effects. You may report side effects to FDA at 1-800-FDA-1088. Where should I keep my medicine? This drug is given in a hospital or clinic and will not be stored at home. NOTE: This sheet is a summary. It may not cover all possible information. If you have questions about this medicine, talk to your doctor, pharmacist, or health care provider.  2015, Elsevier/Gold Standard. (2013-04-07 10:03:48)

## 2015-03-11 ENCOUNTER — Encounter: Payer: Self-pay | Admitting: Gastroenterology

## 2015-03-25 ENCOUNTER — Telehealth: Payer: Self-pay | Admitting: Cardiology

## 2015-03-26 NOTE — Telephone Encounter (Signed)
Closed encounter °

## 2015-03-29 NOTE — Progress Notes (Signed)
HPI: FU dyspnea. Patient also with history of pulmonary fibrosis. Admitted 10/15 with worsening dyspnea. Echo 10/15 showed normal LV function, mild LVH, grade 1 diastolic dysfunction, mild to moderate AI, mild LAE, trace MR. Chest CT 10/15 showed no pulmonary embolus, 3 vessel CAD, severe ILD. Patient treated with steroids and antibiotics with improvement; also treated with diuretics for diastolic CHF (proBNP 2505). Treated medically for presumed CAD based on CT results. Nuclear study 3/16 showed EF 70 and no ischemia. Since last seen, she continues to have significant dyspnea on exertion. No chest pain. No orthopnea, PND or pedal edema.  Current Outpatient Prescriptions  Medication Sig Dispense Refill  . acetaminophen (TYLENOL 8 HOUR) 650 MG CR tablet Take 650 mg by mouth every 8 (eight) hours as needed.     Marland Kitchen aspirin EC 325 MG EC tablet Take 1 tablet (325 mg total) by mouth daily. 30 tablet 0  . atorvastatin (LIPITOR) 20 MG tablet Take 1 tablet (20 mg total) by mouth daily at 6 PM. 30 tablet 6  . cholecalciferol (VITAMIN D) 1000 UNITS tablet Take 1,000 Units by mouth daily.    Marland Kitchen esomeprazole (NEXIUM) 20 MG capsule Take 40 mg by mouth 2 (two) times daily before a meal.    . fluticasone (FLONASE) 50 MCG/ACT nasal spray Place 2 sprays into both nostrils once as needed for allergies or rhinitis.    . furosemide (LASIX) 20 MG tablet Take 1 tablet (20 mg total) by mouth daily. (Patient taking differently: Take 20 mg by mouth daily as needed. ) 90 tablet 3  . guaiFENesin (MUCINEX) 600 MG 12 hr tablet Take 1 tablet (600 mg total) by mouth 2 (two) times daily.    Marland Kitchen losartan (COZAAR) 100 MG tablet Take 1 tablet (100 mg total) by mouth daily. 30 tablet 6  . predniSONE (DELTASONE) 10 MG tablet Take  1 each am 100 tablet 0  . sertraline (ZOLOFT) 100 MG tablet Take 100 mg by mouth daily.     . sodium chloride (OCEAN) 0.65 % SOLN nasal spray Place 1 spray into both nostrils 2 (two) times daily.  0  .  traMADol (ULTRAM) 50 MG tablet Take 50 mg by mouth every 6 (six) hours as needed for moderate pain.      No current facility-administered medications for this visit.     Past Medical History  Diagnosis Date  . Dyspnea   . Diverticulosis 2008  . Internal hemorrhoids   . Esophageal stricture   . Arthritis   . Breast cancer 1969  . GERD (gastroesophageal reflux disease)   . Gallstones   . Depression   . HLD (hyperlipidemia)   . HTN (hypertension)   . Obesity   . Colon polyp 2013    TUBULAR ADENOMA (X1  . Pulmonary fibrosis   . CAD (coronary artery disease)     Past Surgical History  Procedure Laterality Date  . Abdominal hysterectomy    . Cholecystectomy    . Mastectomy      right  . Appendectomy    . Foot surgery    . Breast surgery      History   Social History  . Marital Status: Married    Spouse Name: N/A  . Number of Children: 0  . Years of Education: N/A   Occupational History  . retired    Social History Main Topics  . Smoking status: Never Smoker   . Smokeless tobacco: Never Used  . Alcohol Use: 4.2  oz/week    7 Glasses of wine per week     Comment: ocaasional  . Drug Use: No  . Sexual Activity: Not on file   Other Topics Concern  . Not on file   Social History Narrative    ROS: no fevers or chills, productive cough, hemoptysis, dysphasia, odynophagia, melena, hematochezia, dysuria, hematuria, rash, seizure activity, orthopnea, PND, pedal edema, claudication. Remaining systems are negative.  Physical Exam: Well-developed well-nourished in no acute distress.  Skin is warm and dry.  HEENT is normal.  Neck is supple.  Chest with mild basilar crackles. Cardiovascular exam is regular rate and rhythm.  Abdominal exam nontender or distended. No masses palpated. Extremities show no edema. neuro grossly intact

## 2015-03-30 ENCOUNTER — Ambulatory Visit (INDEPENDENT_AMBULATORY_CARE_PROVIDER_SITE_OTHER): Payer: Medicare Other | Admitting: Cardiology

## 2015-03-30 ENCOUNTER — Encounter: Payer: Self-pay | Admitting: Cardiology

## 2015-03-30 VITALS — BP 164/80 | HR 80 | Ht 60.75 in | Wt 162.6 lb

## 2015-03-30 DIAGNOSIS — J8410 Pulmonary fibrosis, unspecified: Secondary | ICD-10-CM | POA: Diagnosis not present

## 2015-03-30 DIAGNOSIS — I5033 Acute on chronic diastolic (congestive) heart failure: Secondary | ICD-10-CM | POA: Diagnosis not present

## 2015-03-30 DIAGNOSIS — I10 Essential (primary) hypertension: Secondary | ICD-10-CM

## 2015-03-30 DIAGNOSIS — I2583 Coronary atherosclerosis due to lipid rich plaque: Secondary | ICD-10-CM

## 2015-03-30 DIAGNOSIS — I2510 Atherosclerotic heart disease of native coronary artery without angina pectoris: Secondary | ICD-10-CM | POA: Diagnosis not present

## 2015-03-30 DIAGNOSIS — E785 Hyperlipidemia, unspecified: Secondary | ICD-10-CM

## 2015-03-30 NOTE — Assessment & Plan Note (Signed)
Followup pulmonary. 

## 2015-03-30 NOTE — Assessment & Plan Note (Signed)
Continue aspirin and statin. I believe that her dyspnea is most likely secondary to pulmonary fibrosis and not coronary disease. Her nuclear study showed no ischemia.

## 2015-03-30 NOTE — Assessment & Plan Note (Signed)
Continue statin. 

## 2015-03-30 NOTE — Patient Instructions (Signed)
Your physician recommends that you schedule a follow-up appointment in: 6 MONTHS with Dr. Stanford Breed.

## 2015-03-30 NOTE — Assessment & Plan Note (Signed)
Blood pressure elevated. I've asked her to track this and we will add additional medications if it runs high.

## 2015-03-30 NOTE — Assessment & Plan Note (Signed)
euvolemic on examination. Continue Lasix as needed.

## 2015-03-31 ENCOUNTER — Encounter: Payer: Self-pay | Admitting: Internal Medicine

## 2015-03-31 ENCOUNTER — Ambulatory Visit (INDEPENDENT_AMBULATORY_CARE_PROVIDER_SITE_OTHER): Payer: Medicare Other | Admitting: Internal Medicine

## 2015-03-31 VITALS — BP 112/66 | HR 96 | Ht 61.0 in | Wt 160.0 lb

## 2015-03-31 DIAGNOSIS — I2510 Atherosclerotic heart disease of native coronary artery without angina pectoris: Secondary | ICD-10-CM

## 2015-03-31 DIAGNOSIS — J8410 Pulmonary fibrosis, unspecified: Secondary | ICD-10-CM | POA: Diagnosis not present

## 2015-03-31 NOTE — Assessment & Plan Note (Addendum)
PFT's 03/10/10 FEV1 1.68 (130%) ratio 74 , DLCO 48% (vs 45% 04/22/07) corrects to 60%  See inpt adm 08/15/14  -CT 08/15/14 >> moderate CAD, UIP pattern of pulmonary fibrosis with b/l areas of GGO, BTX RUL  -ESR 08/16/14 >> 67 > started systemic steroids as inpt -Echo 08/16/14 >> mild LVH, EF 60 to 74%, diastolic dysfx  -Labs 25/52/58 >> ANA negative, RF 32, anti-CCP negative, SS-A/SS-B negative, SCL-70 negative  -PFT 08/17/14 >> FEV1 1.75 (115%), FEV1% 84, TLC 3.39 (73%), DLCO 45% - 08/31/14   Walked RA x 3 laps @ 185 ft each stopped due to  92% slow pace @ 40 mg per day> rec reduce to 20 mg daily  - 09/28/2014  Walked RA x 3 laps @ 185 ft each stopped due to  Slow to mod pace, some sob at end but sats 94%  - reduced pred to 10 mg daily 09/28/2014 > worse starting 10/14/14 so recheck esr 10/20/2014  - 10/20/2014  Walked RA x 3 laps @ 185 ft each stopped due to end of study, slow to mod pace, no sob or desats on 10 mg daily pred - 11/09/2014   Trial of prednisone 5 mg daily > flared in terms of arthritis > resumed 10 mg per day since around 12/07/14  - 03/31/2015  Walked RA x 3 laps @ 185 ft each stopped due to  End of study, some sob but no desat at nl pace at 10 mg pred per day    I had an extended discussion with the patient reviewing all relevant studies completed to date and  lasting 15 to 20 minutes of a 25 minute visit on the following ongoing concerns:  1) this is clearly a steroid responsive form or ILD c/w connective tissue dz NOS  2) The goal with a chronic steroid dependent illness is always arriving at the lowest effective dose that controls the disease/symptoms and not accepting a set "formula" which is based on statistics or guidelines that don't always take into account patient  variability or the natural hx of the dz in every individual patient, which may well vary over time.  For now therefore I recommend the patient maintain  A floor 10 mg per day having recently flared on 5 mg and having  such tenuous oxygenation status with activity  3)  Each maintenance medication was reviewed in detail including most importantly the difference between maintenance and as needed and under what circumstances the prns are to be used.  Please see instructions for details which were reviewed in writing and the patient given a copy.

## 2015-03-31 NOTE — Patient Instructions (Signed)
No change in prednisone dose   Please schedule a follow up visit in 3 months but call sooner if needed

## 2015-03-31 NOTE — Progress Notes (Signed)
86 yowf never smoker referred by Dr Shaw for eval of new onset sob early 2012 with clear evidence of pulmonary fibrosis ? Related to connective tissue dz 08/2014    History of Present Illness  November 28, 2010 1st pulmonary office eval for new onset doe x across the room, abupt onset right after NY's, assoc with increase cough getting better and chest tightness assoc also with nasal congestion.. sleeping better most of the nights without noct exac or early am excess mucus production.  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 Connective tissue dz related  Chief Complaint  Patient presents with  . HFU    Pt states that her breathing has improved, but not back to baseline. No new co's today.   improved to point where could do HT on day of ov on RA Prednisone 2 with breakfast rec Nexium should be 40 mg daily automatically  Take 30-60 min before first meal of the day and pepcid ac 20 mg at  Bedtime until you return  Reduce prednisone to 20 mg daily with bfast  GERD diet   09/28/2014 f/u ov/Jashun Puertas re: PF/ assoc with arthritis/Pos RA/on prednisone new rx 08/24/14   Chief Complaint  Patient presents with  . Follow-up    Pt states that her breathing continues to improve, but not yet back to normal baseline.    can do HT leaning on basket / uses [facility] parking / level into house and very sob if  carries groceries in vs better   a year ago=  clean house , walk around block, no HC parking  About 50% back to baseline since  on prednisone  Previously reports short courses of pred helped arthritic symptoms a lot but never on it more than a few weeks and is concerned about staying on it. rec Reduce prednisone to 10 mg each am    10/20/2014 f/u ov/Jayvien Rowlette re: pred 10 mg daily for PF related to ? RA  Chief Complaint  Patient presents with  . Acute Visit    Pt c/o weakness, dizziness and SOB for the past 6 days. She is also c/o prod cough with thick, clear sputum.    Not really sure breathing is worse so much as her fatigue is and this correlates best with resolution of chronic leg swelling while on lasix, her doe and arthritis may are really not changed on 20 vs 10 of prednisone.  rec No change prednisone pending labs but hold the lasix until swelling gets noticeable and take it  As needed .   11/09/2014 f/u ov/Remedios Mckone re: pf/ ? Assoc connective tissue dz  On 10 mg pred daily  Chief Complaint  Patient presents with  . Follow-up    Pt states that she is unchanged. She feels like she is having some increased nasal and chest congestion, has to clear her throat alot.     rec Try prednisone 10   mg one half  daily at breakfast Continue nexium 40 mg Take 30-60 min before first meal of the day and Pepcid 20 mg at supper  Please remember to go to the x-ray department downstairs for your tests - we will call you with the results when they are available. Please schedule a follow up office visit in 6 weeks, call sooner if needed - if worse increase prednisone to 10 mg daily       12/30/2014 f/u ov/Beverley Allender re: sob Chief Complaint  Patient presents with  . Follow-up    Pt states that her congestion seems to be better. Her breathing is overall doing well. No new co's today.    Decreased lasix by half, increased prednisone to 10 mg daily after back pain / hands pain increased on the lower dose but not  sob/cough at lower dose rec Ok to maintain predisone at 10 mg per day since this dose helps arthritis and therefore probably more than adequate to cover lung inflammation I am deferring whether to refer you to rheumatology to Dr Raul Del capable hands   03/31/2015 f/u ov/Shalla Bulluck re: connective tissue dz/ PF   Chief Complaint  Patient presents with  . Follow-up    Pt states her breathing is unchanged since the last visit. No new co's today.    can do any shopping she wants as long as has a cart and hc parking and no cough or joint aches at 10 mg daily - really  Not limited by breathing from desired activities      No obvious day to day or daytime variabilty or assoc  cough or cp or chest tightness, subjective wheeze overt sinus or hb symptoms. No unusual exp hx or h/o childhood pna/ asthma or knowledge of premature birth.  Sleeping ok without nocturnal  or early am exacerbation  of respiratory  c/o's or need for noct saba. Also denies any obvious fluctuation of symptoms with weather or environmental changes or other aggravating or alleviating factors except as outlined above   Current Medications, Allergies, Complete Past Medical History, Past Surgical History, Family History, and Social History were reviewed in [employer] record.  ROS  The following are not active complaints unless bolded sore throat, dysphagia, dental problems, itching, sneezing,  nasal congestion or excess/ purulent secretions, ear ache,   fever, chills, sweats, unintended wt loss, pleuritic or exertional cp, hemoptysis,  orthopnea pnd or leg swelling, presyncope, palpitations, heartburn, abdominal pain, anorexia, nausea, vomiting, diarrhea  or change in bowel or urinary habits, change in stools or urine, dysuria,hematuria,  rash, arthralgias, visual complaints, headache, numbness weakness or ataxia or problems with walking or coordination,  change in mood/affect or memory.                  Past  Surgical History:  Hysterectomy  Cholecystectomy mid 90's  Radial rt mastectomy 1969 - never chemo   Family History:  Emphysema- Brother  Heart dz- Both Parents  Bladder CA- Mother  Esophageal CA- Brother  Prostate CA- Brother   Social History:  Married  No children  Retired Web designer  Never smoker  ETOH rare    :      Pex  amb wf nad  09/28/2014    160  Vs 10/20/2014  160 > 11/09/2014 162 > 12/30/2014  160  > 03/31/2015 161  Wt Readings from Last 3 Encounters:  08/31/14 158 lb (71.668 kg)  08/15/14 159 lb 2.8 oz (72.2 kg)  08/07/14  162 lb (73.483 kg)     HEENT: nl dentition, turbinates, and orophanx. Nl external ear canals without cough reflex   NECK :  without JVD/Nodes/TM/ nl carotid upstrokes bilaterally   LUNGS: no acc muscle use,  Bilateral very  subtle  basilar  insp crackles s cough on insp   CV:  RRR  no s3 or murmur or increase in P2, no edema   ABD:  soft and nontender with nl excursion in the supine position. No bruits or organomegaly, bowel sounds nl  MS:  warm without deformities, calf tenderness, cyanosis or clubbing  SKIN: warm and dry without lesions    NEURO:  alert, approp, no deficits        Lab 10/20/14 1534  NA 131*  K 4.7  CL 100  CO2 25  BUN 12  CREATININE 0.7  GLUCOSE 144*    Recent Labs Lab 10/20/14 1534  HGB 14.5  HCT 43.5  WBC 9.0  PLT 262.0     Lab Results  Component Value Date   TSH 0.589 08/16/2014     Lab Results  Component Value Date   PROBNP 802.7* 08/17/2014     Lab Results  Component Value Date   ESRSEDRATE 21 10/20/2014      cxr 11/09/14  Chronic interstitial changes/fibrosis particularly at lung bases with chronic RIGHT upper lobe and minimal RIGHT basilar scarring. No acute abnormalities

## 2015-04-19 ENCOUNTER — Other Ambulatory Visit: Payer: Self-pay | Admitting: Internal Medicine

## 2015-05-12 ENCOUNTER — Ambulatory Visit (INDEPENDENT_AMBULATORY_CARE_PROVIDER_SITE_OTHER): Payer: Medicare Other | Admitting: Family Medicine

## 2015-05-12 VITALS — BP 148/72 | HR 84 | Temp 98.2°F | Resp 18 | Ht 61.5 in | Wt 160.8 lb

## 2015-05-12 DIAGNOSIS — S91109A Unspecified open wound of unspecified toe(s) without damage to nail, initial encounter: Secondary | ICD-10-CM | POA: Diagnosis not present

## 2015-05-12 DIAGNOSIS — I2510 Atherosclerotic heart disease of native coronary artery without angina pectoris: Secondary | ICD-10-CM | POA: Diagnosis not present

## 2015-05-12 NOTE — Progress Notes (Signed)
[facility] [address], [place] [address] [phone]  Date:  05/12/2015   Name:  [name]   DOB:  1928/01/19   MRN:  [id number]  PCP:  Marton Redwood, MD    Chief Complaint: Nail Problem   History of Present Illness:  [name] is a 81 y.o. very pleasant female patient who presents with the following:  She cut her left 4th toe about 1.5 hours ago while trimming her nails. It would not stop bleeding so she came in to be seen She is on prednisone long term but not on any blood thinners She feels sure that she has had a tetanus shot in the last 10 years  Patient Active Problem List   Diagnosis Date Noted  . Hyperlipidemia 09/17/2014  . Acute respiratory failure 08/31/2014  . Hyponatremia 08/20/2014  . Diastolic dysfunction with acute on chronic heart failure 08/17/2014  . Coronary artery disease 08/17/2014  . Leukocytosis 08/17/2014  . Protein-calorie malnutrition 08/17/2014  . Postinflammatory pulmonary fibrosis 08/16/2014  . Acute respiratory failure with hypoxemia 08/16/2014  . Dyspnea 08/15/2014  . Abdominal pain, unspecified site 08/03/2014  . Change in bowel habits 07/24/2012  . Special screening for malignant neoplasms, colon 12/08/2011  . Stricture and stenosis of esophagus 12/08/2011  . NEOPLASM, MALIGNANT, RIGHT BREAST 11/28/2010  . OBSTRUCTIVE SLEEP APNEA 11/28/2010  . Essential hypertension 11/28/2010  . ALLERGIC RHINITIS 11/28/2010  . GERD 11/28/2010  . DYSPNEA 11/28/2010    Past Medical History  Diagnosis Date  . Dyspnea   . Diverticulosis 2008  . Internal hemorrhoids   . Esophageal stricture   . Arthritis   . Breast cancer 1969  . GERD (gastroesophageal reflux disease)   . Gallstones   . Depression   . HLD (hyperlipidemia)   . HTN (hypertension)   . Obesity   . Colon polyp 2013    TUBULAR ADENOMA (X1  . Pulmonary fibrosis   . CAD (coronary artery disease)     Past Surgical History  Procedure Laterality  Date  . Abdominal hysterectomy    . Cholecystectomy    . Mastectomy      right  . Appendectomy    . Foot surgery    . Breast surgery      History  Substance Use Topics  . Smoking status: Never Smoker   . Smokeless tobacco: Never Used  . Alcohol Use: 4.2 oz/week    7 Glasses of wine per week     Comment: ocaasional    Family History  Problem Relation Age of Onset  . Heart disease Mother   . Cancer Mother     bladder  . Esophageal cancer Brother   . Prostate cancer Brother   . Heart disease Brother   . Cancer Brother     sarcoma  . Stomach cancer Neg Hx     Allergies  Allergen Reactions  . Antihistamines, Loratadine-Type   . Codeine   . Darvocet [Propoxyphene N-Acetaminophen]   . Moxifloxacin     REACTION: nightmares and anxiety    Medication list has been reviewed and updated.  Current Outpatient Prescriptions on File Prior to Visit  Medication Sig Dispense Refill  . aspirin EC 325 MG EC tablet Take 1 tablet (325 mg total) by mouth daily. 30 tablet 0  . atorvastatin (LIPITOR) 20 MG tablet Take 1 tablet (20 mg total) by mouth daily at 6 PM. 30 tablet 6  . cholecalciferol (VITAMIN D) 1000 UNITS tablet  Take 1,000 Units by mouth daily.    Marland Kitchen esomeprazole (NEXIUM) 20 MG capsule Take 40 mg by mouth 2 (two) times daily before a meal.    . fluticasone (FLONASE) 50 MCG/ACT nasal spray Place 2 sprays into both nostrils once as needed for allergies or rhinitis.    . furosemide (LASIX) 20 MG tablet Take 20 mg by mouth daily as needed.    Marland Kitchen guaiFENesin (MUCINEX) 600 MG 12 hr tablet Take 1 tablet (600 mg total) by mouth 2 (two) times daily.    Marland Kitchen losartan (COZAAR) 100 MG tablet Take 1 tablet (100 mg total) by mouth daily. 30 tablet 6  . predniSONE (DELTASONE) 10 MG tablet TAKE 1 TABLET BY MOUTH EVERY MORNING 100 tablet 0  . sertraline (ZOLOFT) 100 MG tablet Take 100 mg by mouth daily.     Marland Kitchen acetaminophen (TYLENOL 8 HOUR) 650 MG CR tablet Take 650 mg by mouth every 8 (eight)  hours as needed.     . sodium chloride (OCEAN) 0.65 % SOLN nasal spray Place 1 spray into both nostrils 2 (two) times daily. (Patient not taking: Reported on 05/12/2015)  0  . traMADol (ULTRAM) 50 MG tablet Take 50 mg by mouth every 6 (six) hours as needed for moderate pain.      No current facility-administered medications on file prior to visit.    Review of Systems:  As per HPI- otherwise negative.   Physical Examination: Filed Vitals:   05/12/15 1518  BP: 148/72  Pulse: 84  Temp: 98.2 F (36.8 C)  Resp: 18   Filed Vitals:   05/12/15 1518  Height: 5' 1.5" (1.562 m)  Weight: 160 lb 12.8 oz (72.938 kg)   Body mass index is 29.89 kg/(m^2). Ideal Body Weight: Weight in (lb) to have BMI = 25: 134.2   GEN: WDWN, NAD, Non-toxic, Alert & Oriented x 3, looks well and younger than age 46: Atraumatic, Normocephalic.  Ears and Nose: No external deformity. EXTR: No clubbing/cyanosis/edema NEURO: Normal gait.  PSYCH: Normally interactive. Conversant. Not depressed or anxious appearing.  Calm demeanor.  She has cut off the skin at the very tip of the 4th toe with nail clippers- the wound is not deep and has stopped bleeding.    Cleaned area with gauze and peroxide- wound oozed a little bit then.  Used silver nitrate sticks for hemostasis and dressed  Assessment and Plan: Wound, open, toe, initial encounter  Cleaned and cauterized, dressed as above.  Reassurance that nothing further is needed at this time, she will seek care if any other concerns   Signed Lamar Blinks, MD

## 2015-05-12 NOTE — Patient Instructions (Signed)
Let me know if you have any further problems!   Take care

## 2015-05-17 DIAGNOSIS — I10 Essential (primary) hypertension: Secondary | ICD-10-CM | POA: Diagnosis not present

## 2015-05-17 DIAGNOSIS — R7301 Impaired fasting glucose: Secondary | ICD-10-CM | POA: Diagnosis not present

## 2015-05-17 DIAGNOSIS — M8580 Other specified disorders of bone density and structure, unspecified site: Secondary | ICD-10-CM | POA: Diagnosis not present

## 2015-05-17 DIAGNOSIS — E785 Hyperlipidemia, unspecified: Secondary | ICD-10-CM | POA: Diagnosis not present

## 2015-05-17 DIAGNOSIS — J8410 Pulmonary fibrosis, unspecified: Secondary | ICD-10-CM | POA: Diagnosis not present

## 2015-05-17 DIAGNOSIS — I2510 Atherosclerotic heart disease of native coronary artery without angina pectoris: Secondary | ICD-10-CM | POA: Diagnosis not present

## 2015-05-17 DIAGNOSIS — J849 Interstitial pulmonary disease, unspecified: Secondary | ICD-10-CM | POA: Diagnosis not present

## 2015-05-17 DIAGNOSIS — Z6830 Body mass index (BMI) 30.0-30.9, adult: Secondary | ICD-10-CM | POA: Diagnosis not present

## 2015-06-01 DIAGNOSIS — H00021 Hordeolum internum right upper eyelid: Secondary | ICD-10-CM | POA: Diagnosis not present

## 2015-06-01 DIAGNOSIS — H2513 Age-related nuclear cataract, bilateral: Secondary | ICD-10-CM | POA: Diagnosis not present

## 2015-06-15 DIAGNOSIS — Z1231 Encounter for screening mammogram for malignant neoplasm of breast: Secondary | ICD-10-CM | POA: Diagnosis not present

## 2015-06-15 DIAGNOSIS — Z853 Personal history of malignant neoplasm of breast: Secondary | ICD-10-CM | POA: Diagnosis not present

## 2015-06-18 DIAGNOSIS — I10 Essential (primary) hypertension: Secondary | ICD-10-CM | POA: Diagnosis not present

## 2015-06-18 DIAGNOSIS — Z6831 Body mass index (BMI) 31.0-31.9, adult: Secondary | ICD-10-CM | POA: Diagnosis not present

## 2015-07-01 ENCOUNTER — Encounter: Payer: Self-pay | Admitting: Internal Medicine

## 2015-07-01 ENCOUNTER — Ambulatory Visit (INDEPENDENT_AMBULATORY_CARE_PROVIDER_SITE_OTHER): Payer: Medicare Other | Admitting: Internal Medicine

## 2015-07-01 VITALS — BP 134/58 | HR 77 | Ht 61.0 in | Wt 162.2 lb

## 2015-07-01 DIAGNOSIS — I2510 Atherosclerotic heart disease of native coronary artery without angina pectoris: Secondary | ICD-10-CM | POA: Diagnosis not present

## 2015-07-01 DIAGNOSIS — E669 Obesity, unspecified: Secondary | ICD-10-CM | POA: Diagnosis not present

## 2015-07-01 DIAGNOSIS — J8410 Pulmonary fibrosis, unspecified: Secondary | ICD-10-CM

## 2015-07-01 MED ORDER — PREDNISONE 5 MG (21) PO TBPK: 5.0000 mg | ORAL_TABLET | Freq: Every day | ORAL | Status: DC

## 2015-07-01 NOTE — Patient Instructions (Addendum)
Try 10 mg alternating with 5 mg daily x 2 weeks then 5 mg daily x 2 weeks then 5 mg every other day using your ability to walk and aches in joints as indicator that if either problem worsens resume 10 mg daily as a "ceiling"  Please schedule a follow up office visit in 6 -8 weeks, call sooner if needed

## 2015-07-01 NOTE — Progress Notes (Signed)
86 yowf never smoker referred by Dr Shaw for eval of new onset sob early 2012 with clear evidence of pulmonary fibrosis ? Related to connective tissue dz 08/2014    History of Present Illness  November 28, 2010 1st pulmonary office eval for new onset doe x across the room, abupt onset right after NY's, assoc with increase cough getting better and chest tightness assoc also with nasal congestion.. sleeping better most of the nights without noct exac or early am excess mucus production.  rec No evidence asthma copd  Try rx for gerd and off all resp meds and f/u in 2 weeks if not better > did not return   Date of Admission: 08/15/2014  Date of Discharge: 08/20/2014   Consults:  Pulmonology- Josiah Nieto, MD  Cardiology- Peter Jordan, MD  Discharge Diagnoses:  Acute respiratory failure with hypoxemia  Interstitial Lung Disease/Postinflammatory pulmonary fibrosis > started steroids 08/16/14  Essential hypertension  Dyspnea  Diastolic dysfunction with acute on chronic heart failure (EF 60-65%)  Coronary artery disease  Leukocytosis  Protein-calorie malnutrition  Hyponatremia    08/31/2014 post hosp f/u ov/Katlynn Naser re: pf/ ? Connective tissue dz related  Chief Complaint  Patient presents with  . HFU    Pt states that her breathing has improved, but not back to baseline. No new co's today.   improved to point where could do HT on day of ov on RA Prednisone 2 with breakfast rec Nexium should be 40 mg daily automatically  Take 30-60 min before first meal of the day and pepcid ac 20 mg at  Bedtime until you return  Reduce prednisone to 20 mg daily with bfast  GERD diet   09/28/2014 f/u ov/Kayleb Warshaw re: PF/ assoc with arthritis/Pos RA/on prednisone new rx 08/24/14   Chief Complaint  Patient presents with  . Follow-up    Pt states that her breathing continues to improve, but not yet back to normal baseline.    can do HT leaning on basket / uses [facility] parking / level into house and very sob if  carries groceries in vs better   a year ago=  clean house , walk around block, no [facility] parking  About 50% back to baseline since  on prednisone  Previously reports short courses of pred helped arthritic symptoms a lot but never on it more than a few weeks and is concerned about staying on it. rec Reduce prednisone to 10 mg each am    10/20/2014 f/u ov/Yousif Edelson re: pred 10 mg daily for PF related to ? RA  Chief Complaint  Patient presents with  . Acute Visit    Pt c/o weakness, dizziness and SOB for the past 6 days. She is also c/o prod cough with thick, clear sputum.    Not really sure breathing is worse so much as her fatigue is and this correlates best with resolution of chronic leg swelling while on lasix, her doe and arthritis may are really not changed on 20 vs 10 of prednisone.  rec No change prednisone pending labs but hold the lasix until swelling gets noticeable and take it  As needed .   11/09/2014 f/u ov/Helyn Schwan re: pf/ ? Assoc connective tissue dz  On 10 mg pred daily  Chief Complaint  Patient presents with  . Follow-up    Pt states that she is unchanged. She feels like she is having some increased nasal and chest congestion, has to clear her throat alot.     rec Try prednisone 10   mg one half  daily at breakfast Continue nexium 40 mg Take 30-60 min before first meal of the day and Pepcid 20 mg at supper  Please remember to go to the x-ray department downstairs for your tests - we will call you with the results when they are available. Please schedule a follow up office visit in 6 weeks, call sooner if needed - if worse increase prednisone to 10 mg daily       12/30/2014 f/u ov/Kyira Volkert re: sob Chief Complaint  Patient presents with  . Follow-up    Pt states that her congestion seems to be better. Her breathing is overall doing well. No new co's today.    Decreased lasix by half, increased prednisone to 10 mg daily after back pain / hands pain increased on the lower dose but not  sob/cough at lower dose rec Ok to maintain predisone at 10 mg per day since this dose helps arthritis and therefore probably more than adequate to cover lung inflammation I am deferring whether to refer you to rheumatology to Dr Raul Del capable hands   03/31/2015 f/u ov/Malky Rudzinski re: connective tissue dz/ PF   Chief Complaint  Patient presents with  . Follow-up    Pt states her breathing is unchanged since the last visit. No new co's today.    can do any shopping she wants as long as has a cart and [facility]c parking and no cough or joint aches at 10 mg daily - really  Not limited by breathing from desired activities   rec No change rx = 10 mg pred daily    07/01/2015 f/u ov/Kennede Lusk re: aches/ ild that appear pred responsive Chief Complaint  Patient presents with  . Follow-up    Pt states her breathing is about the same "maybe some worse".     On 10 mg daily thru 8/21 no aches and no change in doe so reduced dose on her own to 5 mg qod. Min dry daytime coughing   No obvious day to day or daytime variabilty or assoc  cp or chest tightness, subjective wheeze overt sinus or hb symptoms. No unusual exp hx or h/o childhood pna/ asthma or knowledge of premature birth.  Sleeping ok without nocturnal  or early am exacerbation  of respiratory  c/o's or need for noct saba. Also denies any obvious fluctuation of symptoms with weather or environmental changes or other aggravating or alleviating factors except as outlined above   Current Medications, Allergies, Complete Past Medical History, Past Surgical History, Family History, and Social History were reviewed in [employer] record.  ROS  The following are not active complaints unless bolded sore throat, dysphagia, dental problems, itching, sneezing,  nasal congestion or excess/ purulent secretions, ear ache,   fever, chills, sweats, unintended wt loss, pleuritic or exertional cp, hemoptysis,  orthopnea pnd or leg swelling, presyncope,  palpitations, heartburn, abdominal pain, anorexia, nausea, vomiting, diarrhea  or change in bowel or urinary habits, change in stools or urine, dysuria,hematuria,  rash, arthralgias, visual complaints, headache, numbness weakness or ataxia or problems with walking or coordination,  change in mood/affect or memory.                  Past Surgical History:  Hysterectomy  Cholecystectomy mid 90's  Radial rt mastectomy 1969 - never chemo   Family History:  Emphysema- Brother  Heart dz- Both Parents  Bladder CA- Mother  Esophageal CA- Brother  Prostate CA- Brother   Social History:  Married  No children  Retired Web designer  Never smoker  ETOH rare    :      Pex  amb wf nad  09/28/2014    160  Vs 10/20/2014  160 > 11/09/2014 162 > 12/30/2014  160  > 03/31/2015 161 > 07/01/2015  162  Wt Readings from Last 3 Encounters:  08/31/14 158 lb (71.668 kg)  08/15/14 159 lb 2.8 oz (72.2 kg)  08/07/14 162 lb (73.483 kg)     HEENT: nl dentition, turbinates, and orophanx. Nl external ear canals without cough reflex   NECK :  without JVD/Nodes/TM/ nl carotid upstrokes bilaterally   LUNGS: no acc muscle use,  Bilateral faint  basilar  insp crackles s cough on insp   CV:  RRR  no s3 or murmur or increase in P2, no edema   ABD:  soft and nontender with nl excursion in the supine position. No bruits or organomegaly, bowel sounds nl  MS:  warm without deformities, calf tenderness, cyanosis or clubbing  SKIN: warm and dry without lesions    NEURO:  alert, approp, no deficits      Lab Results  Component Value Date   ESRSEDRATE 21 10/20/2014   ESRSEDRATE 59* 08/17/2014               86 yowf never smoker referred by Dr Brigitte Pulse for eval of new onset sob early 2012 with clear evidence of pulmonary fibrosis ? Related to connective tissue dz 08/2014    History of Present Illness  November 28, 2010 1st pulmonary office eval for new onset doe x across the room, abupt  onset right after NY's, assoc with increase cough getting better and chest tightness assoc also with nasal congestion.. sleeping better most of the nights without noct exac or early am excess mucus production.  rec No evidence asthma copd  Try rx for gerd and off all resp meds and f/u in 2 weeks if not better > did not return   Date of Admission: 08/15/2014  Date of Discharge: 08/20/2014   Consults:  Pulmonology- Christinia Gully, MD  Cardiology- Peter Martinique, MD  Discharge Diagnoses:  Acute respiratory failure with hypoxemia  Interstitial Lung Disease/Postinflammatory pulmonary fibrosis > started steroids 08/16/14  Essential hypertension  Dyspnea  Diastolic dysfunction with acute on chronic heart failure (EF 60-65%)  Coronary artery disease  Leukocytosis  Protein-calorie malnutrition  Hyponatremia    08/31/2014 post hosp f/u ov/Kalika Smay re: pf/ ? Connective tissue dz related  Chief Complaint  Patient presents with  . HFU    Pt states that her breathing has improved, but not back to baseline. No new co's today.   improved to point where could do HT on day of ov on RA Prednisone 2 with breakfast rec Nexium should be 40 mg daily automatically  Take 30-60 min before first meal of the day and pepcid ac 20 mg at  Bedtime until you return  Reduce prednisone to 20 mg daily with bfast  GERD diet   09/28/2014 f/u ov/Caydan Mctavish re: PF/ assoc with arthritis/Pos RA/on prednisone new rx 08/24/14   Chief Complaint  Patient presents with  . Follow-up    Pt states that her breathing continues to improve, but not yet back to normal baseline.    can do HT leaning on basket / uses [employer] parking / level into house and very sob if carries groceries in vs better   a year ago=  clean house , walk around block, no [facility]  parking  About 50% back to baseline since  on prednisone  Previously reports short courses of pred helped arthritic symptoms a lot but never on it more than a few weeks and is concerned about  staying on it. rec Reduce prednisone to 10 mg each am    10/20/2014 f/u ov/Nayshawn Mesta re: pred 10 mg daily for PF related to ? RA  Chief Complaint  Patient presents with  . Acute Visit    Pt c/o weakness, dizziness and SOB for the past 6 days. She is also c/o prod cough with thick, clear sputum.    Not really sure breathing is worse so much as her fatigue is and this correlates best with resolution of chronic leg swelling while on lasix, her doe and arthritis may are really not changed on 20 vs 10 of prednisone.  rec No change prednisone pending labs but hold the lasix until swelling gets noticeable and take it  As needed .   11/09/2014 f/u ov/Latosha Gaylord re: pf/ ? Assoc connective tissue dz  On 10 mg pred daily  Chief Complaint  Patient presents with  . Follow-up    Pt states that she is unchanged. She feels like she is having some increased nasal and chest congestion, has to clear her throat alot.     rec Try prednisone 10 mg one half  daily at breakfast Continue nexium 40 mg Take 30-60 min before first meal of the day and Pepcid 20 mg at supper  Please remember to go to the x-ray department downstairs for your tests - we will call you with the results when they are available. Please schedule a follow up office visit in 6 weeks, call sooner if needed - if worse increase prednisone to 10 mg daily       12/30/2014 f/u ov/Koua Deeg re: sob Chief Complaint  Patient presents with  . Follow-up    Pt states that her congestion seems to be better. Her breathing is overall doing well. No new co's today.    Decreased lasix by half, increased prednisone to 10 mg daily after back pain / hands pain increased on the lower dose but not sob/cough at lower dose rec Ok to maintain predisone at 10 mg per day since this dose helps arthritis and therefore probably more than adequate to cover lung inflammation I am deferring whether to refer you to rheumatology to Dr Raul Del capable hands   03/31/2015 f/u ov/Darlina Mccaughey re:  connective tissue dz/ PF   Chief Complaint  Patient presents with  . Follow-up    Pt states her breathing is unchanged since the last visit. No new co's today.    can do any shopping she wants as long as has a cart and hc parking and no cough or joint aches at 10 mg daily - really  Not limited by breathing from desired activities  rec  no change rx   07/01/2015 f/u ov/Yarnell Kozloski re: PF/ steroid responsive assoc with nonspecific arthritis Chief Complaint  Patient presents with  . Follow-up    Pt states her breathing is about the same "maybe some worse".    was on 10 mg daily thru 06/27/15 then abruptly reduced to 5 mg qod for reasons not clear, with no change in doe/ arthritis complains or cough    No obvious day to day or daytime variabilty or assoc    cp or chest tightness, subjective wheeze overt sinus or hb symptoms. No unusual exp hx or h/o childhood pna/ asthma or knowledge  of premature birth.  Sleeping ok without nocturnal  or early am exacerbation  of respiratory  c/o's or need for noct saba. Also denies any obvious fluctuation of symptoms with weather or environmental changes or other aggravating or alleviating factors except as outlined above   Current Medications, Allergies, Complete Past Medical History, Past Surgical History, Family History, and Social History were reviewed in [employer] record.  ROS  The following are not active complaints unless bolded sore throat, dysphagia, dental problems, itching, sneezing,  nasal congestion or excess/ purulent secretions, ear ache,   fever, chills, sweats, unintended wt loss, pleuritic or exertional cp, hemoptysis,  orthopnea pnd or leg swelling, presyncope, palpitations, heartburn, abdominal pain, anorexia, nausea, vomiting, diarrhea  or change in bowel or urinary habits, change in stools or urine, dysuria,hematuria,  rash, arthralgias, visual complaints, headache, numbness weakness or ataxia or problems with walking or  coordination,  change in mood/affect or memory.                  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 Nl external ear canals without cough reflex   NECK :  without JVD/Nodes/TM/ nl carotid upstrokes bilaterally   LUNGS: no acc muscle use,  Bilateral very  subtle  basilar  insp crackles s cough on insp   CV:  RRR  no s3 or murmur or increase in P2, no edema   ABD:  soft and nontender with nl excursion in the supine position. No bruits or organomegaly, bowel sounds nl  MS:  warm without deformities, calf tenderness, cyanosis or clubbing  SKIN: warm and dry without lesions    NEURO:  alert, approp, no deficits         I personally reviewed images and agree with radiology impression as follows:  CXR:  11/09/14   Chronic interstitial changes/fibrosis particularly at lung bases with chronic RIGHT upper lobe and minimal RIGHT basilar scarring. No acute abnormalities    Lab Results  Component Value Date   ESRSEDRATE 21 10/20/2014   ESRSEDRATE 67* 08/17/2014

## 2015-07-01 NOTE — Assessment & Plan Note (Addendum)
PFT's 03/10/10 FEV1 1.68 (130%) ratio 74 , DLCO 48% (vs 45% 04/22/07) corrects to 60%  See inpt adm 08/15/14  -CT 08/15/14 >> moderate CAD, UIP pattern of pulmonary fibrosis with b/l areas of GGO, BTX RUL  -ESR 08/16/14 >> 67 > started systemic steroids as inpt -Echo 08/16/14 >> mild LVH, EF 60 to 40%, diastolic dysfx  -Labs 37/54/36 >> ANA negative, RF 32, anti-CCP negative, SS-A/SS-B negative, SCL-70 negative  -PFT 08/17/14 >> FEV1 1.75 (115%), FEV1% 84, TLC 3.39 (73%), DLCO 45% - 08/31/14   Walked RA x 3 laps @ 185 ft each stopped due to  92% slow pace @ 40 mg per day> rec reduce to 20 mg daily  - 09/28/2014  Walked RA x 3 laps @ 185 ft each stopped due to  Slow to mod pace, some sob at end but sats 94%  - reduced pred to 10 mg daily 09/28/2014 > worse starting 10/14/14 so recheck esr 10/20/2014  - 10/20/2014  Walked RA x 3 laps @ 185 ft each stopped due to end of study, slow to mod pace, no sob or desats on 10 mg daily pred - 11/09/2014   Trial of prednisone 5 mg daily > flared in terms of arthritis > resumed 10 mg per day since around 12/07/14  - 03/31/2015  Walked RA x 3 laps @ 185 ft each stopped due to  End of study, some sob but no desat at nl pace at 10 mg pred per day  - 07/01/2015  Walked RA x 3 laps @ 185 ft each stopped due to min sob/ nl pace no desat    At a dose of 10 mg daily until 06/27/15 then changed herself to 5 mg qod> rec slow taper back to same floor   The goal with a chronic steroid dependent illness is always arriving at the lowest effective dose that controls the disease/symptoms and not accepting a set "formula" which is based on statistics or guidelines that don't always take into account patient  variability or the natural hx of the dz in every individual patient, which may well vary over time.  For now therefore I recommend the patient maintain  A floor of 5 mg qod though should not change dose so dramatically and re-eval at 2 weeks before tapering further.  I had an extended  discussion with the patient reviewing all relevant studies completed to date and  lasting 15 to 20 minutes of a 25 minute visit  1) change to 10/5 x 2weeks then 5 mg daily x 2 weeks using doe and aches to see if can taper all the way to 43m qod as the new floor and then return for walking sats and esr      Each maintenance medication was reviewed in detail including most importantly the difference between maintenance and prns and under what circumstances the prns are to be triggered using an action plan format that is not reflected in the computer generated alphabetically organized AVS.    Please see instructions for details which were reviewed in writing and the patient given a copy highlighting the part that I personally wrote and discussed at today's ov.

## 2015-07-01 NOTE — Assessment & Plan Note (Signed)
Body mass index is 30.66 kg/(m^2).  Lab Results  Component Value Date   TSH 0.589 08/16/2014     Contributing to gerd tendency/ doe/reviewed need  achieve and maintain neg calorie balance > defer f/u primary care including intermittently monitoring thyroid status

## 2015-07-02 ENCOUNTER — Ambulatory Visit: Payer: Medicare Other | Admitting: Internal Medicine

## 2015-07-02 IMAGING — CR DG CHEST 2V
2 series · 2 of 2 positions shown · non-contrast
Comparison: No prior chest x-rays.

CLINICAL DATA: Shortness of breath and cough.

EXAM:
CHEST - 2 VIEW

[w chest pa]
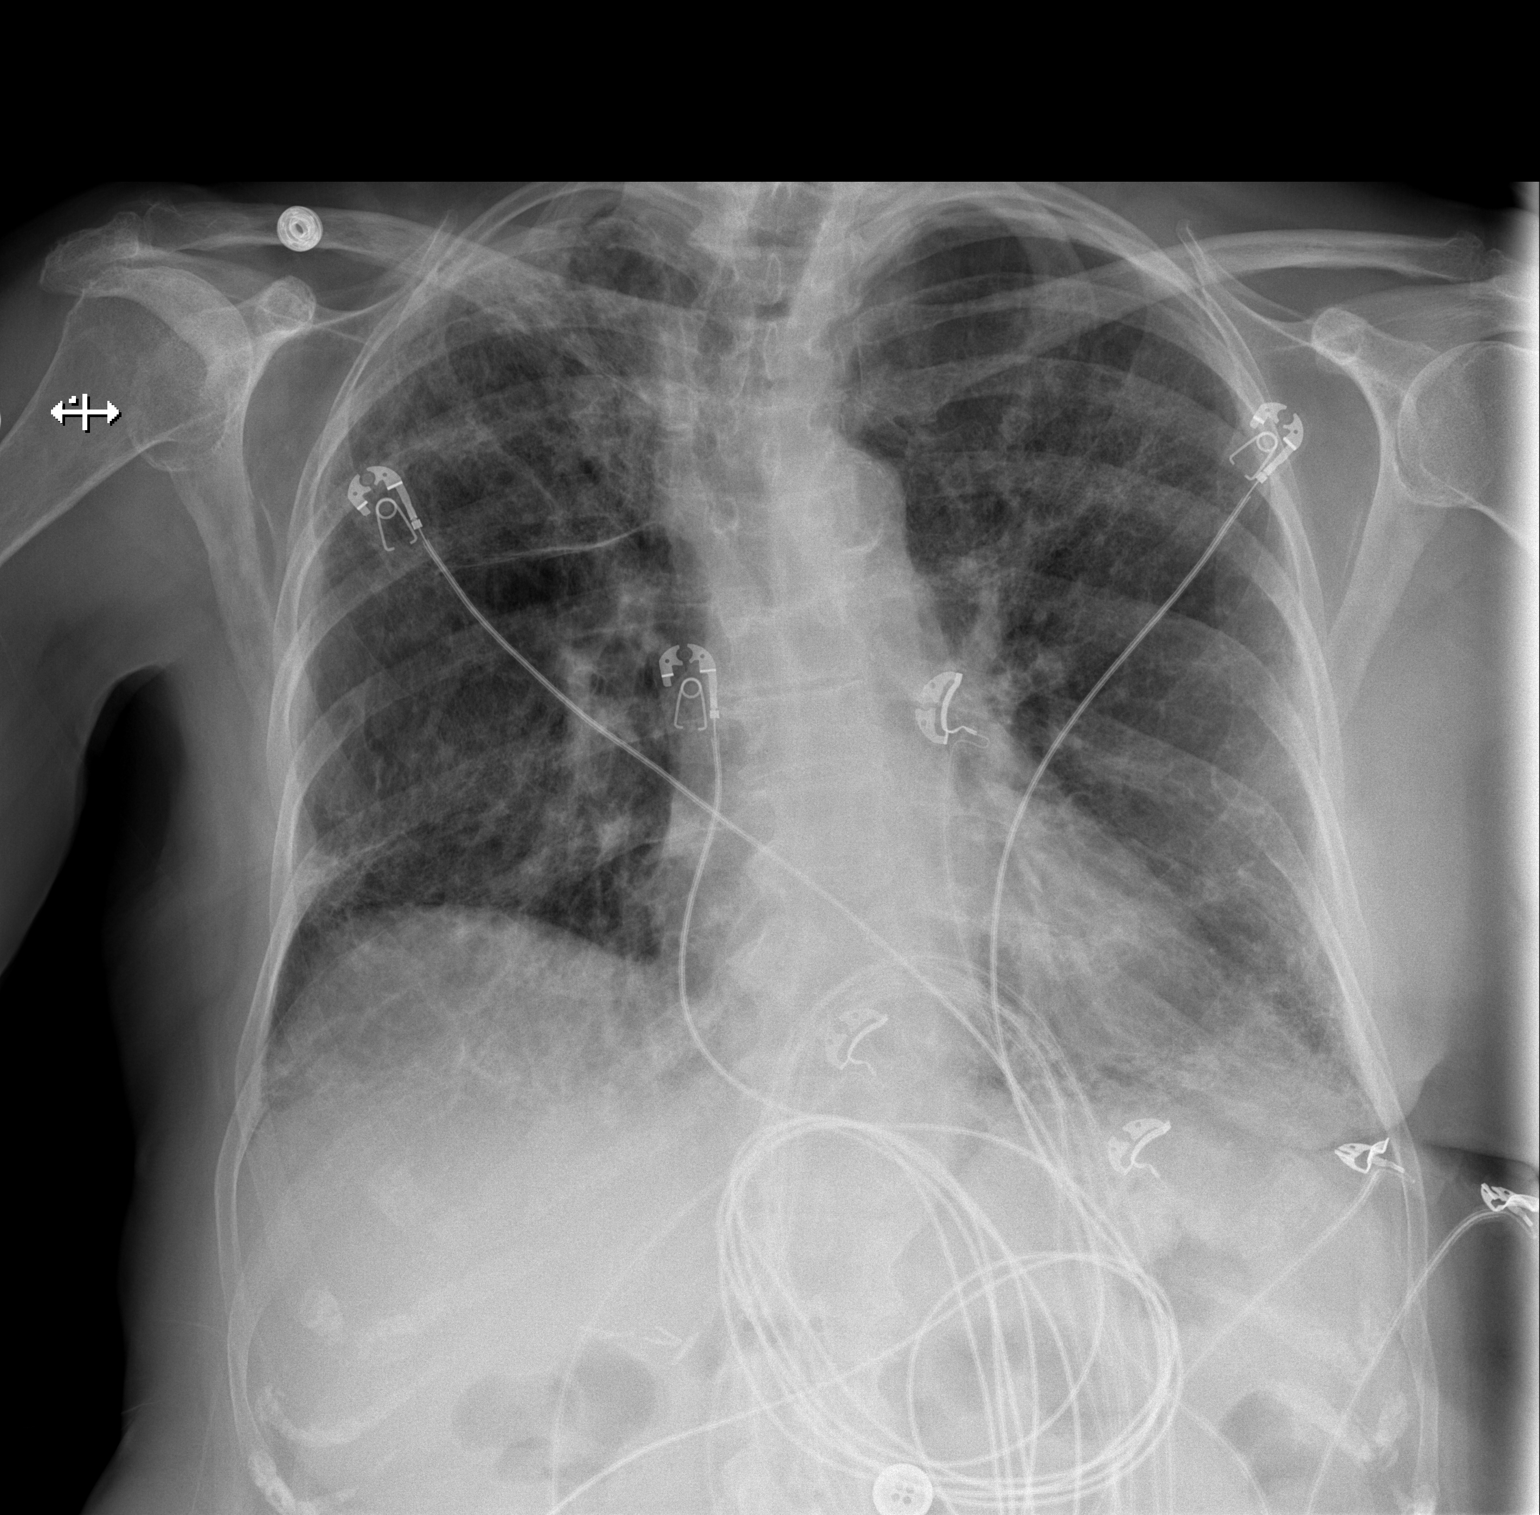

[w chest lat]
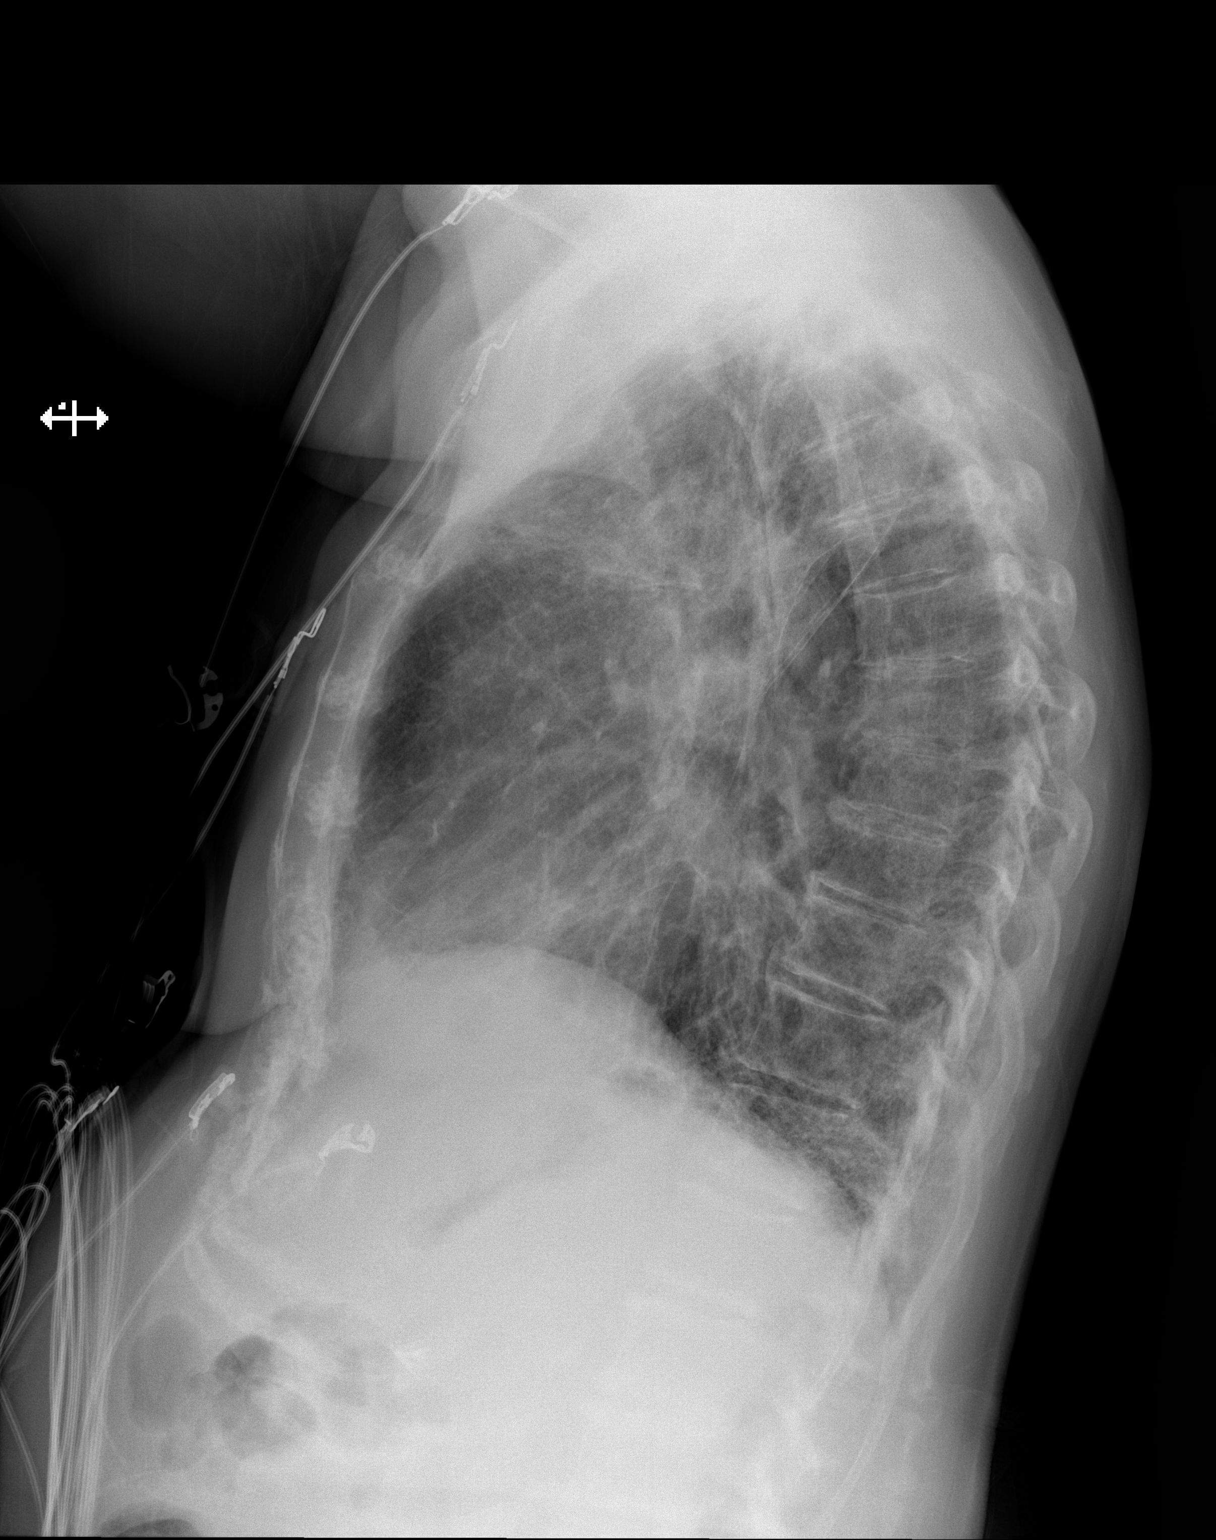

[2 of 2 positions shown; findings below may reference images not displayed]

Comparison is made to CT
appearance of the lung bases on prior CT studies from 07/30/2014 and
02/18/2012.
FINDINGS: There is a component of diffuse bilateral chronic lung disease which
is fibrotic in nature. Lung bases on prior abdomen CT shows similar
changes. There may be component of acute infiltrate, especially in
the right upper lung. No pulmonary edema, pleural effusions or
pneumothorax identified. The anterior right hemidiaphragm is mildly
elevated. The heart size and mediastinal contours are within normal
limits. The bony thorax shows degenerative disease of the thoracic
spine with associated mild rightward convex scoliosis.
IMPRESSION: Diffuse chronic lung disease which most likely represents pulmonary
fibrosis. Without prior chest x-rays, it it would be difficult to
exclude acute infiltrate, especially in the right upper lobe.

## 2015-07-03 IMAGING — DX DG CHEST 1V PORT
1 series · 1 of 1 positions shown · non-contrast
Comparison: August 15, 2014 chest radiograph and chest CT

CLINICAL DATA: Chronic interstitial lung disease ; acute onset
dyspnea with exertion

EXAM:
PORTABLE CHEST - 1 VIEW

[chest ap]
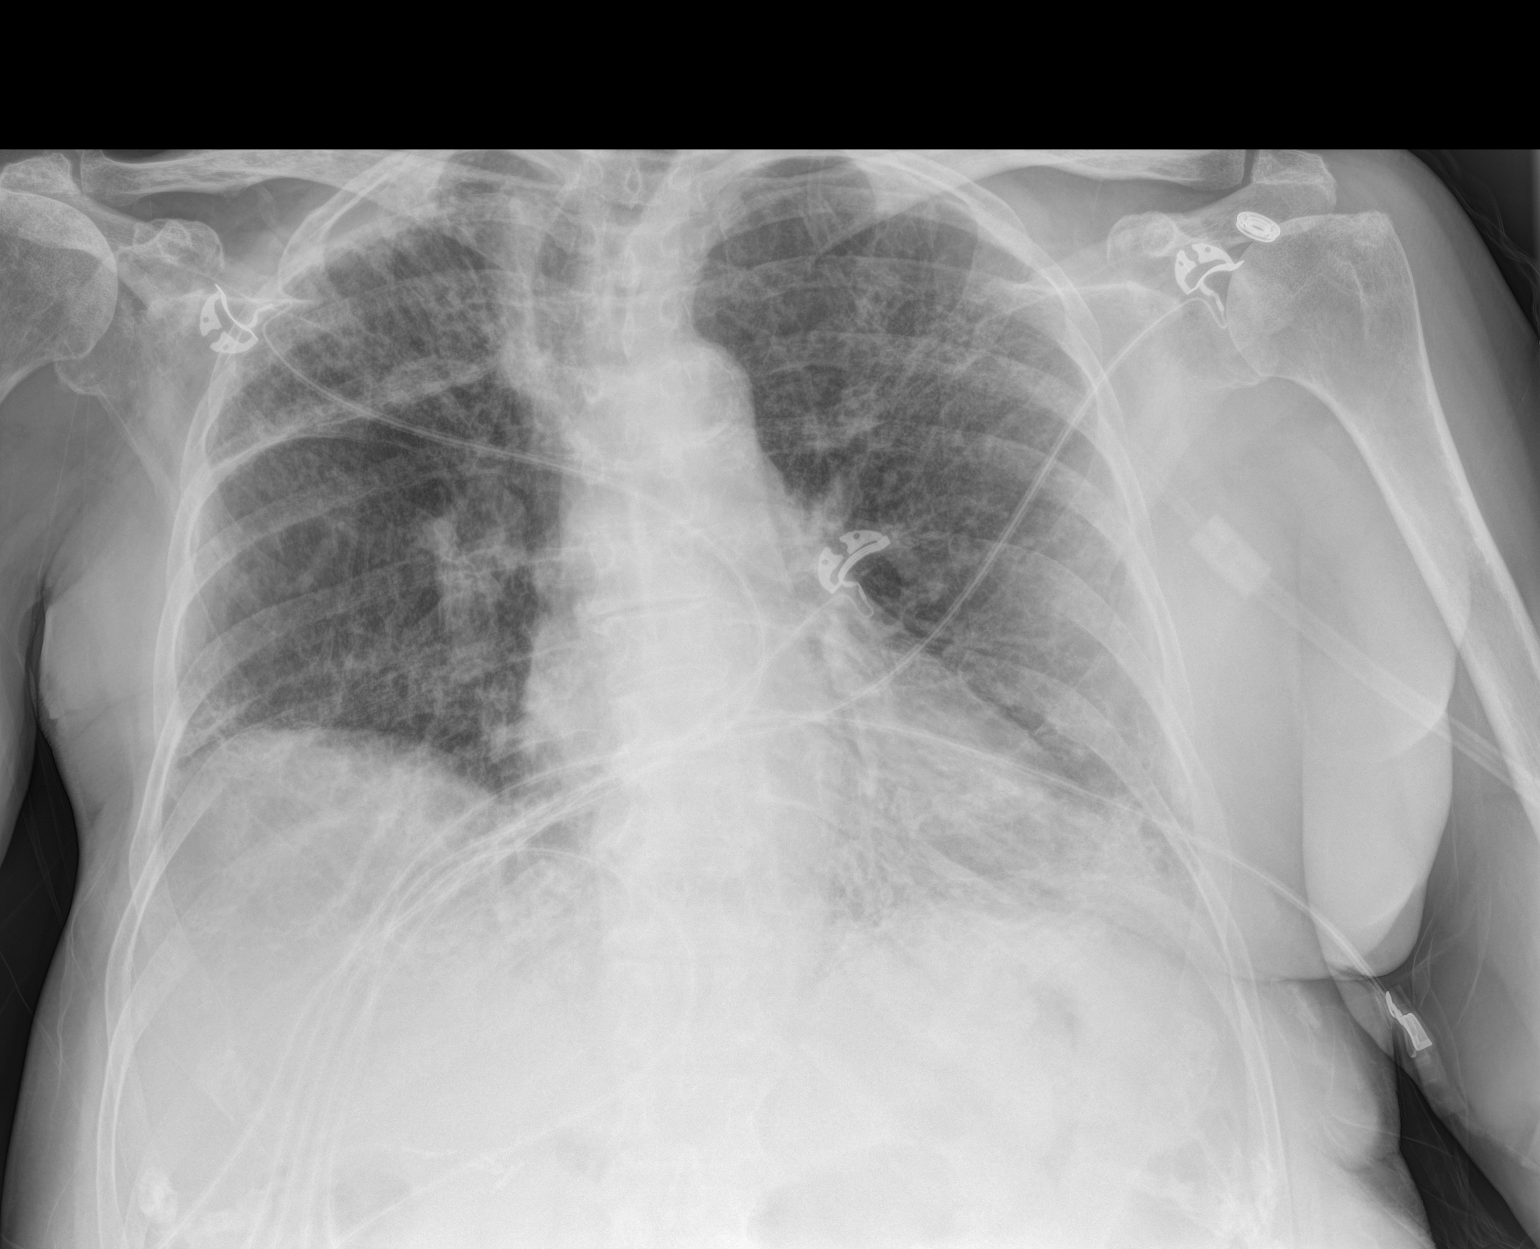

[1 of 1 positions shown; findings below may reference images not displayed]

FINDINGS: Widespread reticular type interstitial disease/pulmonary fibrosis
remains without change. There is stable asymmetric apical pleural
thickening on the right compared to the left. No new opacity. Heart
size and pulmonary vascularity are overall within normal limits. No
adenopathy apparent. There is upper thoracic dextroscoliosis. There
is atherosclerotic change in aorta.
IMPRESSION: Stable chronic interstitial lung disease/ pulmonary fibrosis. No new
opacity to suggest superimposed edema or consolidation. Subtle acute
pneumonia could easily be obscured given the degree of underlying
interstitial disease, however.

## 2015-07-04 ENCOUNTER — Encounter: Payer: Self-pay | Admitting: Internal Medicine

## 2015-07-06 DIAGNOSIS — H5213 Myopia, bilateral: Secondary | ICD-10-CM | POA: Diagnosis not present

## 2015-07-06 DIAGNOSIS — H524 Presbyopia: Secondary | ICD-10-CM | POA: Diagnosis not present

## 2015-07-06 DIAGNOSIS — H04123 Dry eye syndrome of bilateral lacrimal glands: Secondary | ICD-10-CM | POA: Diagnosis not present

## 2015-07-06 DIAGNOSIS — H2513 Age-related nuclear cataract, bilateral: Secondary | ICD-10-CM | POA: Diagnosis not present

## 2015-07-06 DIAGNOSIS — H52223 Regular astigmatism, bilateral: Secondary | ICD-10-CM | POA: Diagnosis not present

## 2015-07-13 ENCOUNTER — Other Ambulatory Visit: Payer: Self-pay | Admitting: Internal Medicine

## 2015-07-28 ENCOUNTER — Telehealth: Payer: Self-pay | Admitting: Internal Medicine

## 2015-07-28 ENCOUNTER — Encounter: Payer: Self-pay | Admitting: Internal Medicine

## 2015-07-28 ENCOUNTER — Ambulatory Visit (INDEPENDENT_AMBULATORY_CARE_PROVIDER_SITE_OTHER): Payer: Medicare Other | Admitting: Internal Medicine

## 2015-07-28 ENCOUNTER — Ambulatory Visit (INDEPENDENT_AMBULATORY_CARE_PROVIDER_SITE_OTHER)
Admission: RE | Admit: 2015-07-28 | Discharge: 2015-07-28 | Disposition: A | Discharge status: Home / Self Care | Payer: Medicare Other | Source: Ambulatory Visit | Attending: Internal Medicine | Admitting: Internal Medicine

## 2015-07-28 VITALS — BP 136/70 | HR 99 | Temp 97.7°F | Ht 65.0 in | Wt 164.0 lb

## 2015-07-28 DIAGNOSIS — I2510 Atherosclerotic heart disease of native coronary artery without angina pectoris: Secondary | ICD-10-CM | POA: Diagnosis not present

## 2015-07-28 DIAGNOSIS — R0602 Shortness of breath: Secondary | ICD-10-CM | POA: Diagnosis not present

## 2015-07-28 DIAGNOSIS — R0600 Dyspnea, unspecified: Secondary | ICD-10-CM

## 2015-07-28 DIAGNOSIS — J8410 Pulmonary fibrosis, unspecified: Secondary | ICD-10-CM | POA: Diagnosis not present

## 2015-07-28 DIAGNOSIS — R0902 Hypoxemia: Secondary | ICD-10-CM | POA: Diagnosis not present

## 2015-07-28 DIAGNOSIS — J9611 Chronic respiratory failure with hypoxia: Secondary | ICD-10-CM | POA: Insufficient documentation

## 2015-07-28 DIAGNOSIS — J8489 Other specified interstitial pulmonary diseases: Secondary | ICD-10-CM | POA: Diagnosis not present

## 2015-07-28 DIAGNOSIS — R05 Cough: Secondary | ICD-10-CM | POA: Diagnosis not present

## 2015-07-28 NOTE — Progress Notes (Signed)
[name] never smoker referred by Dr Shaw for eval of new onset sob early 2012 with clear evidence of pulmonary fibrosis ? Related to connective tissue dz 08/2014    History of Present Illness  November 28, 2010 1st pulmonary office eval for new onset doe x across the room, abupt onset right after NY's, assoc with increase cough getting better and chest tightness assoc also with nasal congestion.. sleeping better most of the nights without noct exac or early am excess mucus production.  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 Connective tissue dz related  Chief Complaint  Patient presents with  . HFU    Pt states that her breathing has improved, but not back to baseline. No new co's today.   improved to point where could do HT on day of ov on RA Prednisone 2 with breakfast rec Nexium should be 40 mg daily automatically  Take 30-60 min before first meal of the day and pepcid ac 20 mg at  Bedtime until you return  Reduce prednisone to 20 mg daily with bfast  GERD diet   09/28/2014 f/u ov/Wert re: PF/ assoc with arthritis/Pos RA/on prednisone new rx 08/24/14   Chief Complaint  Patient presents with  . Follow-up    Pt states that her breathing continues to improve, but not yet back to normal baseline.    can do HT leaning on basket / uses [facility] parking / level into house and very sob if  carries groceries in vs better   a year ago=  clean house , walk around block, no [facility] parking  About 50% back to baseline since  on prednisone  Previously reports short courses of pred helped arthritic symptoms a lot but never on it more than a few weeks and is concerned about staying on it. rec Reduce prednisone to 10 mg each am    10/20/2014 f/u ov/Wert re: pred 10 mg daily for PF related to ? RA  Chief Complaint  Patient presents with  . Acute Visit    Pt c/o weakness, dizziness and SOB for the past 6 days. She is also c/o prod cough with thick, clear sputum.    Not really sure breathing is worse so much as her fatigue is and this correlates best with resolution of chronic leg swelling while on lasix, her doe and arthritis may are really not changed on 20 vs 10 of prednisone.  rec No change prednisone pending labs but hold the lasix until swelling gets noticeable and take it  As needed .   11/09/2014 f/u ov/Wert re: pf/ ? Assoc connective tissue dz  On 10 mg pred daily  Chief Complaint  Patient presents with  . Follow-up    Pt states that she is unchanged. She feels like she is having some increased nasal and chest congestion, has to clear her throat alot.     rec Try prednisone 10   mg one half  daily at breakfast Continue nexium 40 mg Take 30-60 min before first meal of the day and Pepcid 20 mg at supper  Please remember to go to the x-ray department downstairs for your tests - we will call you with the results when they are available. Please schedule a follow up office visit in 6 weeks, call sooner if needed - if worse increase prednisone to 10 mg daily       12/30/2014 f/u ov/Wert re: sob Chief Complaint  Patient presents with  . Follow-up    Pt states that her congestion seems to be better. Her breathing is overall doing well. No new co's today.    Decreased lasix by half, increased prednisone to 10 mg daily after back pain / hands pain increased on the lower dose but not  sob/cough at lower dose rec Ok to maintain predisone at 10 mg per day since this dose helps arthritis and therefore probably more than adequate to cover lung inflammation I am deferring whether to refer you to rheumatology to Dr Raul Del capable hands   03/31/2015 f/u ov/Wert re: connective tissue dz/ PF   Chief Complaint  Patient presents with  . Follow-up    Pt states her breathing is unchanged since the last visit. No new co's today.    can do any shopping she wants as long as has a cart and [facility] parking and no cough or joint aches at 10 mg daily - really  Not limited by breathing from desired activities   rec No change rx = 10 mg pred daily    07/01/2015 f/u ov/Wert re: aches/ ild that appear pred responsive Chief Complaint  Patient presents with  . Follow-up    Pt states her breathing is about the same "maybe some worse".     On 10 mg daily thru 8/21 no aches and no change in doe so reduced dose on her own to 5 mg qod. Min dry daytime coughing   No obvious day to day or daytime variabilty or assoc  cp or chest tightness, subjective wheeze overt sinus or hb symptoms. No unusual exp hx or h/o childhood pna/ asthma or knowledge of premature birth.  Sleeping ok without nocturnal  or early am exacerbation  of respiratory  c/o's or need for noct saba. Also denies any obvious fluctuation of symptoms with weather or environmental changes or other aggravating or alleviating factors except as outlined above   Current Medications, Allergies, Complete Past Medical History, Past Surgical History, Family History, and Social History were reviewed in [facility] record.  ROS  The following are not active complaints unless bolded sore throat, dysphagia, dental problems, itching, sneezing,  nasal congestion or excess/ purulent secretions, ear ache,   fever, chills, sweats, unintended wt loss, pleuritic or exertional cp, hemoptysis,  orthopnea pnd or leg swelling, presyncope,  palpitations, heartburn, abdominal pain, anorexia, nausea, vomiting, diarrhea  or change in bowel or urinary habits, change in stools or urine, dysuria,hematuria,  rash, arthralgias, visual complaints, headache, numbness weakness or ataxia or problems with walking or coordination,  change in mood/affect or memory.                  Past Surgical History:  Hysterectomy  Cholecystectomy mid 90's  Radial rt mastectomy 1969 - never chemo   Family History:  Emphysema- Brother  Heart dz- Both Parents  Bladder CA- Mother  Esophageal CA- Brother  Prostate CA- Brother   Social History:  Married  No children  Retired Web designer  Never smoker  ETOH rare    :      Pex  amb wf nad  09/28/2014    160  Vs 10/20/2014  160 > 11/09/2014 162 > 12/30/2014  160  > 03/31/2015 161 > 07/01/2015  162  Wt Readings from Last 3 Encounters:  08/31/14 158 lb (71.668 kg)  08/15/14 159 lb 2.8 oz (72.2 kg)  08/07/14 162 lb (73.483 kg)     HEENT: nl dentition, turbinates, and orophanx. Nl external ear canals without cough reflex   NECK :  without JVD/Nodes/TM/ nl carotid upstrokes bilaterally   LUNGS: no acc muscle use,  Bilateral faint  basilar  insp crackles s cough on insp   CV:  RRR  no s3 or murmur or increase in P2, no edema   ABD:  soft and nontender with nl excursion in the supine position. No bruits or organomegaly, bowel sounds nl  MS:  warm without deformities, calf tenderness, cyanosis or clubbing  SKIN: warm and dry without lesions    NEURO:  alert, approp, no deficits      Lab Results  Component Value Date   ESRSEDRATE 21 10/20/2014   ESRSEDRATE 59* 08/17/2014               [name] never smoker referred by Dr Brigitte Pulse for eval of new onset sob early 2012 with clear evidence of pulmonary fibrosis ? Related to connective tissue dz 08/2014    History of Present Illness  November 28, 2010 1st pulmonary office eval for new onset doe x across the room, abupt  onset right after NY's, assoc with increase cough getting better and chest tightness assoc also with nasal congestion.. sleeping better most of the nights without noct exac or early am excess mucus production.  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 Connective tissue dz related  Chief Complaint  Patient presents with  . HFU    Pt states that her breathing has improved, but not back to baseline. No new co's today.   improved to point where could do HT on day of ov on RA Prednisone 2 with breakfast rec Nexium should be 40 mg daily automatically  Take 30-60 min before first meal of the day and pepcid ac 20 mg at  Bedtime until you return  Reduce prednisone to 20 mg daily with bfast  GERD diet   09/28/2014 f/u ov/Wert re: PF/ assoc with arthritis/Pos RA/on prednisone new rx 08/24/14   Chief Complaint  Patient presents with  . Follow-up    Pt states that her breathing continues to improve, but not yet back to normal baseline.    can do HT leaning on basket / uses [place] parking / level into house and very sob if carries groceries in vs better   a year ago=  clean house , walk around block, no [facility]  parking  About 50% back to baseline since  on prednisone  Previously reports short courses of pred helped arthritic symptoms a lot but never on it more than a few weeks and is concerned about  staying on it. rec Reduce prednisone to 10 mg each am    10/20/2014 f/u ov/Wert re: pred 10 mg daily for PF related to ? RA  Chief Complaint  Patient presents with  . Acute Visit    Pt c/o weakness, dizziness and SOB for the past 6 days. She is also c/o prod cough with thick, clear sputum.    Not really sure breathing is worse so much as her fatigue is and this correlates best with resolution of chronic leg swelling while on lasix, her doe and arthritis may are really not changed on 20 vs 10 of prednisone.  rec No change prednisone pending labs but hold the lasix until swelling gets noticeable and take it  As needed .   11/09/2014 f/u ov/Wert re: pf/ ? Assoc connective tissue dz  On 10 mg pred daily  Chief Complaint  Patient presents with  . Follow-up    Pt states that she is unchanged. She feels like she is having some increased nasal and chest congestion, has to clear her throat alot.     rec Try prednisone 10 mg one half  daily at breakfast Continue nexium 40 mg Take 30-60 min before first meal of the day and Pepcid 20 mg at supper  Please remember to go to the x-ray department downstairs for your tests - we will call you with the results when they are available. Please schedule a follow up office visit in 6 weeks, call sooner if needed - if worse increase prednisone to 10 mg daily       12/30/2014 f/u ov/Wert re: sob Chief Complaint  Patient presents with  . Follow-up    Pt states that her congestion seems to be better. Her breathing is overall doing well. No new co's today.    Decreased lasix by half, increased prednisone to 10 mg daily after back pain / hands pain increased on the lower dose but not sob/cough at lower dose rec Ok to maintain predisone at 10 mg per day since this dose helps arthritis and therefore probably more than adequate to cover lung inflammation I am deferring whether to refer you to rheumatology to Dr Raul Del capable hands   03/31/2015 f/u ov/Wert re:  connective tissue dz/ PF   Chief Complaint  Patient presents with  . Follow-up    Pt states her breathing is unchanged since the last visit. No new co's today.    can do any shopping she wants as long as has a cart and hc parking and no cough or joint aches at 10 mg daily - really  Not limited by breathing from desired activities  rec  no change rx   07/01/2015 f/u ov/Wert re: PF/ steroid responsive assoc with nonspecific arthritis Chief Complaint  Patient presents with  . Follow-up    Pt states her breathing is about the same "maybe some worse".    was on 10 mg daily thru 06/27/15 then abruptly reduced to 5 mg qod for reasons not clear, with no change in doe/ arthritis complains or cough  rec Try 10 mg alternating with 5 mg daily x 2 weeks then 5 mg daily x 2 weeks then 5 mg every other day using your ability to walk and aches in joints as indicator that  if either problem worsens resume 10 mg daily as a "ceiling"    07/28/2015 acute extended ov/Wert re: ? Flare of PF/ arthritis Chief Complaint  Patient presents with  . Acute Visit    Pt c/o increased SOB, increased fatigue x 1 week. Pt also c/o producitve cough with clear phlegm. Denies fever, wheezing, PND, chest or back pain. Pt did not bring her meds to appt as instructed       flared p a week on 5 so went to 10 mg daily with flare also with back pain> improved Cough is assoc with pnds but no excess or purulent sputum  Doe = MMRC2 = can't walk a nl pace on a flat grade s sob    No obvious day to day or daytime variabilty or assoc    cp or chest tightness, subjective wheeze overt sinus or hb symptoms. No unusual exp hx or h/o childhood pna/ asthma or knowledge of premature birth.  Sleeping ok without nocturnal  or early am exacerbation  of respiratory  c/o's or need for noct saba. Also denies any obvious fluctuation of symptoms with weather or environmental changes or other aggravating or alleviating factors except as outlined  above   Current Medications, Allergies, Complete Past Medical History, Past Surgical History, Family History, and Social History were reviewed in [employer] record.  ROS  The following are not active complaints unless bolded sore throat, dysphagia, dental problems, itching, sneezing,  nasal congestion or excess/ purulent secretions, ear ache,   fever, chills, sweats, unintended wt loss, pleuritic or exertional cp, hemoptysis,  orthopnea pnd or leg swelling, presyncope, palpitations, heartburn, abdominal pain, anorexia, nausea, vomiting, diarrhea  or change in bowel or urinary habits, change in stools or urine, dysuria,hematuria,  rash, arthralgias, visual complaints, headache, numbness weakness or ataxia or problems with walking or coordination,  change in mood/affect or memory.                  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 Nl external ear canals without cough reflex   NECK :  without JVD/Nodes/TM/ nl carotid upstrokes bilaterally   LUNGS: no acc muscle use,  Bilateral very  subtle  basilar  insp crackles s cough on insp   CV:  RRR  no s3 or murmur or increase in P2, no edema   ABD:  soft and nontender with nl excursion in the supine position. No bruits or organomegaly, bowel sounds nl  MS:  warm without  deformities, calf tenderness, cyanosis or clubbing  SKIN: warm and dry without lesions    NEURO:  alert, approp, no deficits         I personally reviewed images and  agree with radiology impression as follows:  CXR:   07/28/2015  Chronic scarring/fibrosis in the right upper lobe and bilateral lower lobes. No evidence of acute cardiopulmonary disease.

## 2015-07-28 NOTE — Patient Instructions (Addendum)
Add pepcid ac 20 mg at bedtime and For drainage / throat tickle try take CHLORPHENIRAMINE  4 mg - take one every 4 hours as needed - available over the counter- may cause drowsiness so start with just a bedtime dose or two and see how you tolerate it before trying in daytime    Please remember to go to the   x-ray department downstairs for your tests - we will call you with the results when they are available.  The fact you are so prednisone dependent strongly suggests you have a rheumatologic condition and may benefit from consultation with a  Rheumatologist  No blood work today but I would add at least an ESR to the studies you will get tomorrow with Dr Shaw to evaluate your fatigue   Leave prednisone at 10 mg per day for now with option to increase to 20 mg if condition worsens   Please see patient coordinator before you leave today  to schedule :  Wear 02 2lpm only when walking   Please schedule a follow up visit in 3 months but call sooner if needed     

## 2015-07-28 NOTE — Telephone Encounter (Signed)
Order has been placed on new template. MW needs to "cosign order in epic". Please advise thanks

## 2015-07-28 NOTE — Telephone Encounter (Signed)
Done

## 2015-07-28 NOTE — Telephone Encounter (Signed)
Spoke with the pt  She states having increased SOB today  She also c/o increased chest congestion and cough  OV with MW today at 1:30 and she was advised to bring all meds

## 2015-07-29 ENCOUNTER — Telehealth: Payer: Self-pay | Admitting: Internal Medicine

## 2015-07-29 DIAGNOSIS — Z6829 Body mass index (BMI) 29.0-29.9, adult: Secondary | ICD-10-CM | POA: Diagnosis not present

## 2015-07-29 DIAGNOSIS — I10 Essential (primary) hypertension: Secondary | ICD-10-CM | POA: Diagnosis not present

## 2015-07-29 DIAGNOSIS — Z23 Encounter for immunization: Secondary | ICD-10-CM | POA: Diagnosis not present

## 2015-07-29 DIAGNOSIS — R42 Dizziness and giddiness: Secondary | ICD-10-CM | POA: Diagnosis not present

## 2015-07-29 DIAGNOSIS — R7889 Finding of other specified substances, not normally found in blood: Secondary | ICD-10-CM | POA: Diagnosis not present

## 2015-07-29 DIAGNOSIS — J849 Interstitial pulmonary disease, unspecified: Secondary | ICD-10-CM | POA: Diagnosis not present

## 2015-07-29 DIAGNOSIS — H269 Unspecified cataract: Secondary | ICD-10-CM | POA: Diagnosis not present

## 2015-07-29 DIAGNOSIS — R5383 Other fatigue: Secondary | ICD-10-CM | POA: Diagnosis not present

## 2015-07-29 NOTE — Telephone Encounter (Signed)
Result Note     Call pt: Reviewed cxr and no acute change so no change in recommendations made at ov  --  I spoke with patient about results and she verbalized understanding and had no questions 

## 2015-08-01 ENCOUNTER — Encounter (HOSPITAL_COMMUNITY): Payer: Self-pay | Admitting: *Deleted

## 2015-08-01 ENCOUNTER — Inpatient Hospital Stay (HOSPITAL_COMMUNITY)
Admission: EM | Admit: 2015-08-01 | Discharge: 2015-08-05 | DRG: 196 | Disposition: A | Discharge status: Home Health | Payer: Medicare Other | Attending: Internal Medicine | Admitting: Internal Medicine

## 2015-08-01 ENCOUNTER — Emergency Department (HOSPITAL_COMMUNITY): Payer: Medicare Other

## 2015-08-01 DIAGNOSIS — E871 Hypo-osmolality and hyponatremia: Secondary | ICD-10-CM | POA: Diagnosis present

## 2015-08-01 DIAGNOSIS — T380X5A Adverse effect of glucocorticoids and synthetic analogues, initial encounter: Secondary | ICD-10-CM | POA: Diagnosis present

## 2015-08-01 DIAGNOSIS — K219 Gastro-esophageal reflux disease without esophagitis: Secondary | ICD-10-CM | POA: Diagnosis present

## 2015-08-01 DIAGNOSIS — Z79899 Other long term (current) drug therapy: Secondary | ICD-10-CM

## 2015-08-01 DIAGNOSIS — K5900 Constipation, unspecified: Secondary | ICD-10-CM | POA: Diagnosis present

## 2015-08-01 DIAGNOSIS — I2510 Atherosclerotic heart disease of native coronary artery without angina pectoris: Secondary | ICD-10-CM | POA: Diagnosis present

## 2015-08-01 DIAGNOSIS — I10 Essential (primary) hypertension: Secondary | ICD-10-CM | POA: Diagnosis present

## 2015-08-01 DIAGNOSIS — Z885 Allergy status to narcotic agent status: Secondary | ICD-10-CM | POA: Diagnosis not present

## 2015-08-01 DIAGNOSIS — I5033 Acute on chronic diastolic (congestive) heart failure: Secondary | ICD-10-CM | POA: Diagnosis present

## 2015-08-01 DIAGNOSIS — J9621 Acute and chronic respiratory failure with hypoxia: Secondary | ICD-10-CM | POA: Diagnosis not present

## 2015-08-01 DIAGNOSIS — Z853 Personal history of malignant neoplasm of breast: Secondary | ICD-10-CM

## 2015-08-01 DIAGNOSIS — Z7952 Long term (current) use of systemic steroids: Secondary | ICD-10-CM

## 2015-08-01 DIAGNOSIS — Z66 Do not resuscitate: Secondary | ICD-10-CM | POA: Diagnosis present

## 2015-08-01 DIAGNOSIS — J8410 Pulmonary fibrosis, unspecified: Principal | ICD-10-CM

## 2015-08-01 DIAGNOSIS — Z6828 Body mass index (BMI) 28.0-28.9, adult: Secondary | ICD-10-CM

## 2015-08-01 DIAGNOSIS — Z7982 Long term (current) use of aspirin: Secondary | ICD-10-CM | POA: Diagnosis not present

## 2015-08-01 DIAGNOSIS — I5032 Chronic diastolic (congestive) heart failure: Secondary | ICD-10-CM

## 2015-08-01 DIAGNOSIS — R05 Cough: Secondary | ICD-10-CM | POA: Diagnosis not present

## 2015-08-01 DIAGNOSIS — N390 Urinary tract infection, site not specified: Secondary | ICD-10-CM | POA: Diagnosis present

## 2015-08-01 DIAGNOSIS — I248 Other forms of acute ischemic heart disease: Secondary | ICD-10-CM | POA: Diagnosis present

## 2015-08-01 DIAGNOSIS — M1990 Unspecified osteoarthritis, unspecified site: Secondary | ICD-10-CM | POA: Diagnosis present

## 2015-08-01 DIAGNOSIS — E785 Hyperlipidemia, unspecified: Secondary | ICD-10-CM | POA: Diagnosis present

## 2015-08-01 DIAGNOSIS — Z79891 Long term (current) use of opiate analgesic: Secondary | ICD-10-CM | POA: Diagnosis not present

## 2015-08-01 DIAGNOSIS — Z86010 Personal history of colonic polyps: Secondary | ICD-10-CM | POA: Diagnosis not present

## 2015-08-01 DIAGNOSIS — D72829 Elevated white blood cell count, unspecified: Secondary | ICD-10-CM | POA: Diagnosis present

## 2015-08-01 DIAGNOSIS — Z800 Family history of malignant neoplasm of digestive organs: Secondary | ICD-10-CM | POA: Diagnosis not present

## 2015-08-01 DIAGNOSIS — F329 Major depressive disorder, single episode, unspecified: Secondary | ICD-10-CM | POA: Diagnosis present

## 2015-08-01 DIAGNOSIS — N3000 Acute cystitis without hematuria: Secondary | ICD-10-CM

## 2015-08-01 DIAGNOSIS — Z881 Allergy status to other antibiotic agents status: Secondary | ICD-10-CM | POA: Diagnosis not present

## 2015-08-01 DIAGNOSIS — R0602 Shortness of breath: Secondary | ICD-10-CM | POA: Diagnosis not present

## 2015-08-01 DIAGNOSIS — Z888 Allergy status to other drugs, medicaments and biological substances status: Secondary | ICD-10-CM | POA: Diagnosis not present

## 2015-08-01 DIAGNOSIS — I509 Heart failure, unspecified: Secondary | ICD-10-CM | POA: Diagnosis not present

## 2015-08-01 DIAGNOSIS — E669 Obesity, unspecified: Secondary | ICD-10-CM | POA: Diagnosis present

## 2015-08-01 DIAGNOSIS — Z8249 Family history of ischemic heart disease and other diseases of the circulatory system: Secondary | ICD-10-CM | POA: Diagnosis not present

## 2015-08-01 HISTORY — DX: Chronic diastolic (congestive) heart failure: I50.32

## 2015-08-01 LAB — URINALYSIS, ROUTINE W REFLEX MICROSCOPIC
Bilirubin Urine: NEGATIVE
Glucose, UA: NEGATIVE mg/dL
KETONES UR: 15 mg/dL — AB
NITRITE: NEGATIVE
PH: 6 (ref 5.0–8.0)
Protein, ur: NEGATIVE mg/dL
SPECIFIC GRAVITY, URINE: 1.015 (ref 1.005–1.030)
Urobilinogen, UA: 1 mg/dL (ref 0.0–1.0)

## 2015-08-01 LAB — CBC WITH DIFFERENTIAL/PLATELET
BASOS ABS: 0 10*3/uL (ref 0.0–0.1)
BASOS PCT: 0 %
EOS ABS: 0.1 10*3/uL (ref 0.0–0.7)
Eosinophils Relative: 1 %
HCT: 40.9 % (ref 36.0–46.0)
Hemoglobin: 13.4 g/dL (ref 12.0–15.0)
Lymphocytes Relative: 6 %
Lymphs Abs: 0.9 10*3/uL (ref 0.7–4.0)
MCH: 29.6 pg (ref 26.0–34.0)
MCHC: 32.8 g/dL (ref 30.0–36.0)
MCV: 90.3 fL (ref 78.0–100.0)
MONO ABS: 1.6 10*3/uL — AB (ref 0.1–1.0)
MONOS PCT: 10 %
NEUTROS PCT: 83 %
Neutro Abs: 12.9 10*3/uL — ABNORMAL HIGH (ref 1.7–7.7)
Platelets: 302 10*3/uL (ref 150–400)
RBC: 4.53 MIL/uL (ref 3.87–5.11)
RDW: 13.9 % (ref 11.5–15.5)
WBC: 15.5 10*3/uL — ABNORMAL HIGH (ref 4.0–10.5)

## 2015-08-01 LAB — COMPREHENSIVE METABOLIC PANEL
ALBUMIN: 3.3 g/dL — AB (ref 3.5–5.0)
ALT: 45 U/L (ref 14–54)
ANION GAP: 7 (ref 5–15)
AST: 47 U/L — AB (ref 15–41)
Alkaline Phosphatase: 149 U/L — ABNORMAL HIGH (ref 38–126)
BUN: 10 mg/dL (ref 6–20)
CHLORIDE: 99 mmol/L — AB (ref 101–111)
CO2: 25 mmol/L (ref 22–32)
Calcium: 9.3 mg/dL (ref 8.9–10.3)
Creatinine, Ser: 0.64 mg/dL (ref 0.44–1.00)
GFR calc Af Amer: >60 mL/min (ref >60)
GFR calc non Af Amer: >60 mL/min (ref >60)
GLUCOSE: 106 mg/dL — AB (ref 65–99)
POTASSIUM: 4.3 mmol/L (ref 3.5–5.1)
SODIUM: 131 mmol/L — AB (ref 135–145)
Total Bilirubin: 1.1 mg/dL (ref 0.3–1.2)
Total Protein: 6.9 g/dL (ref 6.5–8.1)

## 2015-08-01 LAB — URINE MICROSCOPIC-ADD ON

## 2015-08-01 LAB — MAGNESIUM: MAGNESIUM: 2 mg/dL (ref 1.7–2.4)

## 2015-08-01 LAB — TROPONIN I

## 2015-08-01 LAB — BRAIN NATRIURETIC PEPTIDE: B NATRIURETIC PEPTIDE 5: 250.1 pg/mL — AB (ref 0.0–100.0)

## 2015-08-01 MED ORDER — SODIUM CHLORIDE 0.9 % IV SOLN
INTRAVENOUS | Status: DC
  Administered 2015-08-01 (×2): via INTRAVENOUS

## 2015-08-01 MED ORDER — FLUTICASONE PROPIONATE 50 MCG/ACT NA SUSP
2.0000 | Freq: Every day | NASAL | Status: DC | PRN
  Administered 2015-08-01 – 2015-08-04 (×3): 2 via NASAL
  Filled 2015-08-01: qty 16

## 2015-08-01 MED ORDER — DEXAMETHASONE SODIUM PHOSPHATE 10 MG/ML IJ SOLN
10.0000 mg | Freq: Once | INTRAMUSCULAR | Status: AC
  Administered 2015-08-01: 10 mg via INTRAVENOUS
  Filled 2015-08-01: qty 1

## 2015-08-01 MED ORDER — SORBITOL 70 % SOLN
30.0000 mL | Freq: Every day | Status: DC | PRN
  Filled 2015-08-01: qty 30

## 2015-08-01 MED ORDER — VITAMIN D3 25 MCG (1000 UNIT) PO TABS
1000.0000 [IU] | ORAL_TABLET | Freq: Every day | ORAL | Status: DC
  Administered 2015-08-01 – 2015-08-05 (×5): 1000 [IU] via ORAL
  Filled 2015-08-01 (×5): qty 1

## 2015-08-01 MED ORDER — SENNOSIDES-DOCUSATE SODIUM 8.6-50 MG PO TABS: 1.0000 | ORAL_TABLET | Freq: Every evening | ORAL | Status: DC | PRN

## 2015-08-01 MED ORDER — IPRATROPIUM-ALBUTEROL 0.5-2.5 (3) MG/3ML IN SOLN
3.0000 mL | Freq: Four times a day (QID) | RESPIRATORY_TRACT | Status: DC
  Administered 2015-08-01: 3 mL via RESPIRATORY_TRACT
  Filled 2015-08-01 (×2): qty 3

## 2015-08-01 MED ORDER — SODIUM CHLORIDE 0.9 % IJ SOLN
3.0000 mL | Freq: Two times a day (BID) | INTRAMUSCULAR | Status: DC
  Administered 2015-08-01 – 2015-08-04 (×3): 3 mL via INTRAVENOUS

## 2015-08-01 MED ORDER — SODIUM CHLORIDE 0.9 % IJ SOLN
3.0000 mL | Freq: Two times a day (BID) | INTRAMUSCULAR | Status: DC
  Administered 2015-08-01 – 2015-08-05 (×3): 3 mL via INTRAVENOUS

## 2015-08-01 MED ORDER — PANTOPRAZOLE SODIUM 40 MG PO TBEC
80.0000 mg | DELAYED_RELEASE_TABLET | Freq: Every day | ORAL | Status: DC
  Administered 2015-08-01 – 2015-08-04 (×4): 80 mg via ORAL
  Filled 2015-08-01 (×5): qty 2

## 2015-08-01 MED ORDER — SERTRALINE HCL 100 MG PO TABS
100.0000 mg | ORAL_TABLET | Freq: Every day | ORAL | Status: DC
  Administered 2015-08-01 – 2015-08-05 (×5): 100 mg via ORAL
  Filled 2015-08-01 (×5): qty 1

## 2015-08-01 MED ORDER — TRAMADOL HCL 50 MG PO TABS: 50.0000 mg | ORAL_TABLET | Freq: Four times a day (QID) | ORAL | Status: DC | PRN

## 2015-08-01 MED ORDER — SODIUM CHLORIDE 0.9 % IJ SOLN: 3.0000 mL | INTRAMUSCULAR | Status: DC | PRN

## 2015-08-01 MED ORDER — AMOXICILLIN-POT CLAVULANATE 875-125 MG PO TABS
1.0000 | ORAL_TABLET | Freq: Two times a day (BID) | ORAL | Status: DC
  Administered 2015-08-01 – 2015-08-05 (×9): 1 via ORAL
  Filled 2015-08-01 (×9): qty 1

## 2015-08-01 MED ORDER — IRBESARTAN 300 MG PO TABS
300.0000 mg | ORAL_TABLET | Freq: Every day | ORAL | Status: DC
  Administered 2015-08-01 – 2015-08-05 (×5): 300 mg via ORAL
  Filled 2015-08-01 (×5): qty 1

## 2015-08-01 MED ORDER — SALINE SPRAY 0.65 % NA SOLN
1.0000 | Freq: Two times a day (BID) | NASAL | Status: DC
  Administered 2015-08-01 – 2015-08-05 (×9): 1 via NASAL
  Filled 2015-08-01: qty 44

## 2015-08-01 MED ORDER — IPRATROPIUM-ALBUTEROL 0.5-2.5 (3) MG/3ML IN SOLN: 3.0000 mL | RESPIRATORY_TRACT | Status: DC | PRN

## 2015-08-01 MED ORDER — ACETAMINOPHEN 650 MG RE SUPP: 650.0000 mg | Freq: Four times a day (QID) | RECTAL | Status: DC | PRN

## 2015-08-01 MED ORDER — FUROSEMIDE 20 MG PO TABS: 20.0000 mg | ORAL_TABLET | ORAL | Status: DC

## 2015-08-01 MED ORDER — ALUM & MAG HYDROXIDE-SIMETH 200-200-20 MG/5ML PO SUSP: 30.0000 mL | Freq: Four times a day (QID) | ORAL | Status: DC | PRN

## 2015-08-01 MED ORDER — ONDANSETRON HCL 4 MG/2ML IJ SOLN: 4.0000 mg | Freq: Four times a day (QID) | INTRAMUSCULAR | Status: DC | PRN

## 2015-08-01 MED ORDER — ACETAMINOPHEN 325 MG PO TABS: 650.0000 mg | ORAL_TABLET | Freq: Four times a day (QID) | ORAL | Status: DC | PRN

## 2015-08-01 MED ORDER — ONDANSETRON HCL 4 MG PO TABS: 4.0000 mg | ORAL_TABLET | Freq: Four times a day (QID) | ORAL | Status: DC | PRN

## 2015-08-01 MED ORDER — ALBUTEROL SULFATE (2.5 MG/3ML) 0.083% IN NEBU
5.0000 mg | INHALATION_SOLUTION | Freq: Once | RESPIRATORY_TRACT | Status: AC
  Administered 2015-08-01: 5 mg via RESPIRATORY_TRACT
  Filled 2015-08-01: qty 6

## 2015-08-01 MED ORDER — SODIUM CHLORIDE 0.9 % IV SOLN: 250.0000 mL | INTRAVENOUS | Status: DC | PRN

## 2015-08-01 MED ORDER — ENOXAPARIN SODIUM 40 MG/0.4ML ~~LOC~~ SOLN
40.0000 mg | SUBCUTANEOUS | Status: DC
  Administered 2015-08-01 – 2015-08-04 (×4): 40 mg via SUBCUTANEOUS
  Filled 2015-08-01 (×5): qty 0.4

## 2015-08-01 MED ORDER — IPRATROPIUM BROMIDE 0.02 % IN SOLN
0.5000 mg | Freq: Once | RESPIRATORY_TRACT | Status: AC
  Administered 2015-08-01: 0.5 mg via RESPIRATORY_TRACT
  Filled 2015-08-01: qty 2.5

## 2015-08-01 MED ORDER — FUROSEMIDE 10 MG/ML IJ SOLN
40.0000 mg | Freq: Once | INTRAMUSCULAR | Status: AC
  Administered 2015-08-01: 40 mg via INTRAVENOUS
  Filled 2015-08-01: qty 4

## 2015-08-01 MED ORDER — BENZONATATE 100 MG PO CAPS
200.0000 mg | ORAL_CAPSULE | Freq: Three times a day (TID) | ORAL | Status: DC
  Administered 2015-08-01 – 2015-08-05 (×13): 200 mg via ORAL
  Filled 2015-08-01 (×14): qty 2

## 2015-08-01 MED ORDER — ASPIRIN EC 325 MG PO TBEC
325.0000 mg | DELAYED_RELEASE_TABLET | Freq: Every day | ORAL | Status: DC
  Administered 2015-08-01 – 2015-08-05 (×5): 325 mg via ORAL
  Filled 2015-08-01 (×5): qty 1

## 2015-08-01 MED ORDER — METHYLPREDNISOLONE SODIUM SUCC 40 MG IJ SOLR
40.0000 mg | Freq: Four times a day (QID) | INTRAMUSCULAR | Status: DC
  Administered 2015-08-01 – 2015-08-02 (×5): 40 mg via INTRAVENOUS
  Filled 2015-08-01 (×6): qty 1

## 2015-08-01 MED ORDER — MAGNESIUM CITRATE PO SOLN: 1.0000 | Freq: Once | ORAL | Status: DC | PRN

## 2015-08-01 NOTE — Progress Notes (Signed)
RT came to bedside to administer neb treatment (Albuterol/Atrovent).  Patient is comfortable on 3 lpm of O2 with saturation of 99%.  BS are clear bilaterally. Patient does not appear to be in distress at this time. FiO2 decreased to 2 lpm. RN notified. RT will continue to monitor patient as needed.

## 2015-08-01 NOTE — Consult Note (Signed)
PCCM PROGRESS NOTE  ADMISSION DATE: 08/01/2015 CONSULT DATE: [id number] REFERRING PROVIDER: [facility]  CC: Progressive fatigue  HPI: 81 yo female followed by Dr. Melvyn Novas in pulmonary office for steroid responsive pulmonary fibrosis.  She has not been feeling well for the past several days.  She has noticed more cough.  She is bringing up clear, thick phlegm.  She gets coughing spells when she takes a deep breath.  She denies hemoptysis, and has not noticed fever.  She denies chest pain.  She has nasal congestion with post-nasal drip, but this is chronic and no worse than usual.  She has been feeling more fatigued.  She didn't notice the typical improvement when she takes more prednisone.  She has noticed more burning with urination, and thinks she might have a bladder infection.   She  has a past medical history of Dyspnea; Diverticulosis (2008); Internal hemorrhoids; Esophageal stricture; Arthritis; Breast cancer (1969); GERD (gastroesophageal reflux disease); Gallstones; Depression; HLD (hyperlipidemia); HTN (hypertension); Obesity; Colon polyp (2013); Pulmonary fibrosis; CAD (coronary artery disease); and Chronic diastolic CHF (congestive heart failure) (08/01/2015).   She  has past surgical history that includes Abdominal hysterectomy; Cholecystectomy; Mastectomy; Appendectomy; Foot surgery; and Breast surgery.   Her family history includes Cancer in her brother and mother; Esophageal cancer in her brother; Heart disease in her brother and mother; Prostate cancer in her brother. There is no history of Stomach cancer.   She  reports that she has never smoked. She has never used smokeless tobacco. She reports that she drinks about 4.2 oz of alcohol per week. She reports that she does not use illicit drugs.   Allergies  Allergen Reactions  . Anesthetics, Amide Nausea And Vomiting  . Antihistamines, Loratadine-Type Other (See Comments)    Weird dreams  . Codeine Nausea And Vomiting  . Darvocet  [Propoxyphene N-Acetaminophen] Nausea And Vomiting  . Moxifloxacin     REACTION: nightmares and anxiety      Medication List    ASK your doctor about these medications        aspirin 325 MG EC tablet  Take 1 tablet (325 mg total) by mouth daily.     atorvastatin 20 MG tablet  Commonly known as:  LIPITOR  Take 1 tablet (20 mg total) by mouth daily at 6 PM.     cholecalciferol 1000 UNITS tablet  Commonly known as:  VITAMIN D  Take 1,000 Units by mouth daily.     esomeprazole 20 MG capsule  Commonly known as:  NEXIUM  Take 40 mg by mouth daily before breakfast.     fluticasone 50 MCG/ACT nasal spray  Commonly known as:  FLONASE  Place 2 sprays into both nostrils once as needed for allergies or rhinitis.     furosemide 20 MG tablet  Commonly known as:  LASIX  Take 20 mg by mouth every Monday, Wednesday, and Friday.     guaiFENesin 600 MG 12 hr tablet  Commonly known as:  MUCINEX  Take 1 tablet (600 mg total) by mouth 2 (two) times daily.     predniSONE 5 MG (21) Tbpk tablet  Commonly known as:  STERAPRED UNI-PAK 21 TAB  Take 1 tablet (5 mg total) by mouth daily. Take as directed     predniSONE 10 MG tablet  Commonly known as:  DELTASONE  TAKE 1 TABLET BY MOUTH EVERY MORNING     sertraline 100 MG tablet  Commonly known as:  ZOLOFT  Take 100 mg by mouth daily.  sodium chloride 0.65 % Soln nasal spray  Commonly known as:  OCEAN  Place 1 spray into both nostrils 2 (two) times daily.     traMADol 50 MG tablet  Commonly known as:  ULTRAM  Take 50 mg by mouth every 6 (six) hours as needed for moderate pain.     TYLENOL 8 HOUR 650 MG CR tablet  Generic drug:  acetaminophen  Take 650 mg by mouth every 8 (eight) hours as needed for pain.     valsartan 320 MG tablet  Commonly known as:  DIOVAN  Take 1 tablet by mouth daily.        ROS: Negative except above.  SUBJECTIVE: Feels tired.  OBJECTIVE: Temp:  [98.1 F (36.7 C)-98.7 F (37.1 C)] 98.1 F (36.7  C) (09/25 1036) Pulse Rate:  [93-107] 104 (09/25 1036) Resp:  [17-25] 18 (09/25 1036) BP: (129-166)/(52-61) 166/61 mmHg (09/25 1036) SpO2:  [95 %-100 %] 99 % (09/25 1036) Weight:  [154 lb 12.2 oz (70.2 kg)-161 lb 6 oz (73.2 kg)] 154 lb 12.2 oz (70.2 kg) (09/25 1036)   General: pleasant, wearing oxygen HEENT: pupils reactive, no sinus tenderness, clear nasal discharge, no oral exudate Cardiac: regular, no murmur Chest: b/l crackles, no wheeze Abd: soft, non tender Ext: no edema, limited ROM of Rt shoulder Neuro: CN intact Skin: no rashes   CMP Latest Ref Rng 08/01/2015 10/20/2014 09/17/2014  Glucose 65 - 99 mg/dL 106(H) 144(H) 86  BUN 6 - 20 mg/dL 10 12 16   Creatinine 0.44 - 1.00 mg/dL 0.64 0.7 0.80  Sodium 135 - 145 mmol/L 131(L) 131(L) 131(L)  Potassium 3.5 - 5.1 mmol/L 4.3 4.7 4.4  Chloride 101 - 111 mmol/L 99(L) 100 93(L)  CO2 22 - 32 mmol/L 25 25 25   Calcium 8.9 - 10.3 mg/dL 9.3 10.4 10.0  Total Protein 6.5 - 8.1 g/dL 6.9 - 6.9  Total Bilirubin 0.3 - 1.2 mg/dL 1.1 - 0.8  Alkaline Phos 38 - 126 U/L 149(H) - 47  AST 15 - 41 U/L 47(H) - 20  ALT 14 - 54 U/L 45 - 18     CBC Latest Ref Rng 08/01/2015 10/20/2014 08/20/2014  WBC 4.0 - 10.5 K/uL 15.5(H) 9.0 13.7(H)  Hemoglobin 12.0 - 15.0 g/dL 13.4 14.5 13.0  Hematocrit 36.0 - 46.0 % 40.9 43.5 40.2  Platelets 150 - 400 K/uL 302 262.0 445(H)     Dg Chest 2 View  08/01/2015   CLINICAL DATA:  Productive cough, shortness of breath, pulmonary fibrosis  EXAM: CHEST  2 VIEW  COMPARISON:  07/28/2015  FINDINGS: Right upper lobe and bilateral lower lobe scarring/fibrosis. No superimposed opacities suspicious for pneumonia.  The heart is normal in size.  Mild degenerative changes of the visualized thoracolumbar spine.  IMPRESSION: Chronic scarring/fibrosis in the right upper lobe and bilateral lower lobes.  No evidence of acute cardiopulmonary disease.   Electronically Signed   By: Julian Hy M.D.   On: 08/01/2015 08:21      CULTURES: Urine 9/25 >>   ANTIBIOTICS: Augmentin 9/25 >>  STUDIES: CT chest 08/15/14 >> b/l GGO, BTX RUL, severe interstitial changes b/l with UIP pattern Echo 08/16/14 >> mild LVH, EF 60 to 72%, grade 1 diastolic dysfx PFT 09/47/09 >> FEV1 1.75 (115%), FEV1% 84, TLC 3.39 (73%), DLCO 45%, no BD  SEROLOGY from 08/17/14: ANA >> negative Rheumatoid factor >> 32  Aldolase >> 8.9 ANCA >> negative SS-A, SS-B >> negative Scl 70 >> negative Anti CCP >> negative  EVENTS: 9/25  Admit  DISCUSSION: 81 yo female with hx of steroid responsive pulmonary fibrosis presents with several days of fatigue, dysuria, dyspnea, and cough.  Concern is that she has a UTI.  She might also have flare of her pulmonary fibrosis +/- acute bronchitis and pulmonary edema.   ASSESSMENT/PLAN:  Acute on chronic respiratory failure with hypoxia >> infection vs edema vs flare of pulmonary fibrosis. Plan: - solumedrol 40 mg q6h >> likely can quickly transition back to prednisone - lasix per primary team - day 1 of augmentin - adjust oxygen to keep SpO2 90 to 95%  Acute on chronic diastolic dysfx. Plan: - BP management and diuresis per primary team  UTI. Plan: - day 1 augmentin - f/u urine cx  Goals of Care >> DNR.   Chesley Mires, MD [facility] Pulmonary/Critical Care 08/01/2015, 11:27 AM Pager:  [phone] After 3pm call: [phone]

## 2015-08-01 NOTE — ED Notes (Signed)
Ems called due to shortness of breath.  Hx pulm fibrosis

## 2015-08-01 NOTE — ED Notes (Signed)
Respiratory notified of neb tx needed.

## 2015-08-01 NOTE — H&P (Signed)
[facility] History and Physical  [name] QQP:[id number] DOB: July 17, 1928 DOA: 08/01/2015  Referring physician: Dr. Zenia Resides PCP: Marton Redwood, MD /cardiology: Dr. Mayo Ao: Dr. Melvyn Novas  Chief Complaint: Worsening shortness of breath  HPI: [name] is a 81 y.o. female  With history of pulmonary fibrosis followed by Dr. Melvyn Novas of [facility], who presents to the ED with a one-week history of worsening shortness of breath, generalized fatigue, productive cough of clear sputum, wheezing. Patient states that in July of this year she was trying to be weaned off her prednisone dose was decreased about 5 mg daily. Patient subsequently presented to Dr. Mady Gemma office and was seen there on Wednesday, 07/28/2015 with complaints of a worsening shortness of breath. Patient states her prednisone dose was subsequently increased to 10 mg daily and patient was prescribed some home oxygen a travel pack however has not received her travel pack. Due to a mixup. Patient does endorse some nausea, chronic intermittent constipation, generalized weakness, dysuria. Patient denies any fevers, no chills, no emesis, no visual changes, no chest pain, no abdominal pain, no diarrhea, no melanotic, no hematemesis, no hematochezia. Patient was seen in the emergency room, comprehensive metabolic profile obtained at a sodium of 131 chloride of 99 glucose of 106 alk phosphatase of 149 abdomen of 3.3 AST of 47 otherwise was within normal limits. BNP was at 250. First set of troponin was negative. CBC had a white count of 15.5 with absolute monocytes of 1.6 and absolute neutrophils of 12.9. Chest x-ray which was done was negative for acute cardiopulmonary disease. Patient was given a dose of IV Decadron and placed on oxygen and [facility] were called to admit the patient for further evaluation and management.   Review of Systems: As per history of present illness otherwise negative. Constitutional:  No  weight loss, night sweats, Fevers, chills, fatigue.  HEENT:  No headaches, Difficulty swallowing,Tooth/dental problems,Sore throat,  No sneezing, itching, ear ache, nasal congestion, post nasal drip,  Cardio-vascular:  No chest pain, Orthopnea, PND, swelling in lower extremities, anasarca, dizziness, palpitations  GI:  No heartburn, indigestion, abdominal pain, nausea, vomiting, diarrhea, change in bowel habits, loss of appetite  Resp:  No shortness of breath with exertion or at rest. No excess mucus, no productive cough, No non-productive cough, No coughing up of blood.No change in color of mucus.No wheezing.No chest wall deformity  Skin:  no rash or lesions.  GU:  no dysuria, change in color of urine, no urgency or frequency. No flank pain.  Musculoskeletal:  No joint pain or swelling. No decreased range of motion. No back pain.  Psych:  No change in mood or affect. No depression or anxiety. No memory loss.   Past Medical History  Diagnosis Date  . Dyspnea   . Diverticulosis 2008  . Internal hemorrhoids   . Esophageal stricture   . Arthritis   . Breast cancer 1969  . GERD (gastroesophageal reflux disease)   . Gallstones   . Depression   . HLD (hyperlipidemia)   . HTN (hypertension)   . Obesity   . Colon polyp 2013    TUBULAR ADENOMA (X1  . Pulmonary fibrosis   . CAD (coronary artery disease)   . Chronic diastolic CHF (congestive heart failure) 08/01/2015   Past Surgical History  Procedure Laterality Date  . Abdominal hysterectomy    . Cholecystectomy    . Mastectomy      right  . Appendectomy    . Foot surgery    .  Breast surgery     Social History:  reports that she has never smoked. She has never used smokeless tobacco. She reports that she drinks about 4.2 oz of alcohol per week. She reports that she does not use illicit drugs.  Allergies  Allergen Reactions  . Anesthetics, Amide Nausea And Vomiting  . Antihistamines, Loratadine-Type Other (See Comments)     Weird dreams  . Codeine Nausea And Vomiting  . Darvocet [Propoxyphene N-Acetaminophen] Nausea And Vomiting  . Moxifloxacin     REACTION: nightmares and anxiety    Family History  Problem Relation Age of Onset  . Heart disease Mother   . Cancer Mother     bladder  . Esophageal cancer Brother   . Prostate cancer Brother   . Heart disease Brother   . Cancer Brother     sarcoma  . Stomach cancer Neg Hx    mother deceased age 65 from an acute MI. Father deceased age 27 from an acute MI. Father also had a history of rheumatic heart disease.  Prior to Admission medications   Medication Sig Start Date End Date Taking? Authorizing Provider  acetaminophen (TYLENOL 8 HOUR) 650 MG CR tablet Take 650 mg by mouth every 8 (eight) hours as needed for pain.    Yes Historical Provider, MD  aspirin EC 325 MG EC tablet Take 1 tablet (325 mg total) by mouth daily. 08/20/14  Yes Marton Redwood, MD  cholecalciferol (VITAMIN D) 1000 UNITS tablet Take 1,000 Units by mouth daily.   Yes Historical Provider, MD  esomeprazole (NEXIUM) 20 MG capsule Take 40 mg by mouth daily before breakfast.    Yes Historical Provider, MD  fluticasone (FLONASE) 50 MCG/ACT nasal spray Place 2 sprays into both nostrils once as needed for allergies or rhinitis.   Yes Historical Provider, MD  furosemide (LASIX) 20 MG tablet Take 20 mg by mouth every Monday, Wednesday, and Friday.    Yes Historical Provider, MD  guaiFENesin (MUCINEX) 600 MG 12 hr tablet Take 1 tablet (600 mg total) by mouth 2 (two) times daily. 08/20/14  Yes Marton Redwood, MD  predniSONE (DELTASONE) 10 MG tablet TAKE 1 TABLET BY MOUTH EVERY MORNING 07/13/15  Yes Tanda Rockers, MD  sertraline (ZOLOFT) 100 MG tablet Take 100 mg by mouth daily.    Yes Historical Provider, MD  sodium chloride (OCEAN) 0.65 % SOLN nasal spray Place 1 spray into both nostrils 2 (two) times daily. 08/20/14  Yes Marton Redwood, MD  traMADol (ULTRAM) 50 MG tablet Take 50 mg by mouth every 6 (six)  hours as needed for moderate pain.    Yes Historical Provider, MD  valsartan (DIOVAN) 320 MG tablet Take 1 tablet by mouth daily. 06/30/15  Yes Historical Provider, MD  atorvastatin (LIPITOR) 20 MG tablet Take 1 tablet (20 mg total) by mouth daily at 6 PM. Patient not taking: Reported on 08/01/2015 08/20/14   Marton Redwood, MD  predniSONE (STERAPRED UNI-PAK 21 TAB) 5 MG (21) TBPK tablet Take 1 tablet (5 mg total) by mouth daily. Take as directed Patient not taking: Reported on 07/28/2015 07/01/15   Tanda Rockers, MD   Physical Exam: Filed Vitals:   08/01/15 0703 08/01/15 0704 08/01/15 0851 08/01/15 1015  BP:  139/61  129/52  Pulse:  93 95 107  Temp:  98.3 F (36.8 C)  98.7 F (37.1 C)  TempSrc:  Oral  Oral  Resp:  _0 SpO2: 98% 95% 99% 100%    Wt Readings  from Last 3 Encounters:  07/28/15 74.39 kg (164 lb)  07/01/15 73.573 kg (162 lb 3.2 oz)  05/12/15 72.938 kg (160 lb 12.8 oz)    General:  Well-developed well-nourished female lying on gurney in no acute cardio pulmonary distress. Speaking in full sentences.  Eyes: PERRLA, EOMI, normal lids, irises & conjunctiva ENT: grossly normal hearing, lips & tongue Neck: no LAD, masses or thyromegaly Cardiovascular: RRR, no m/r/g. No LE edema. Respiratory: Some fine history crackles at the bases. No wheezing noted. No rhonchi. Normal respiratory effort. Abdomen: soft, ntnd, positive bowel sounds, no rebound, no guarding Skin: no rash or induration seen on limited exam Musculoskeletal: grossly normal tone BUE/BLE Psychiatric: grossly normal mood and affect, speech fluent and appropriate Neurologic: Alert and oriented 3. CN 2 through 12 are grossly intact. No focal deficits. grossly non-focal.          Labs on Admission:  Basic Metabolic Panel:  Recent Labs Lab 08/01/15 0745  NA 131*  K 4.3  CL 99*  CO2 25  GLUCOSE 106*  BUN 10  CREATININE 0.64  CALCIUM 9.3   Liver Function Tests:  Recent Labs Lab 08/01/15 0745  AST  47*  ALT 45  ALKPHOS 149*  BILITOT 1.1  PROT 6.9  ALBUMIN 3.3*   No results for input(s): LIPASE, AMYLASE in the last 168 hours. No results for input(s): AMMONIA in the last 168 hours. CBC:  Recent Labs Lab 08/01/15 0745  WBC 15.5*  NEUTROABS 12.9*  HGB 13.4  HCT 40.9  MCV 90.3  PLT 302   Cardiac Enzymes:  Recent Labs Lab 08/01/15 0745  TROPONINI <0.03    BNP (last 3 results)  Recent Labs  08/01/15 0745  BNP 250.1*    ProBNP (last 3 results)  Recent Labs  08/15/14 1457 08/16/14 0336 08/17/14 0515  PROBNP 1859.0* 1717.0* 802.7*    CBG: No results for input(s): GLUCAP in the last 168 hours.  Radiological Exams on Admission: Dg Chest 2 View  08/01/2015   CLINICAL DATA:  Productive cough, shortness of breath, pulmonary fibrosis  EXAM: CHEST  2 VIEW  COMPARISON:  07/28/2015  FINDINGS: Right upper lobe and bilateral lower lobe scarring/fibrosis. No superimposed opacities suspicious for pneumonia.  The heart is normal in size.  Mild degenerative changes of the visualized thoracolumbar spine.  IMPRESSION: Chronic scarring/fibrosis in the right upper lobe and bilateral lower lobes.  No evidence of acute cardiopulmonary disease.   Electronically Signed   By: Julian Hy M.D.   On: 08/01/2015 08:21    EKG: None  Assessment/Plan Principal Problem:   Pulmonary fibrosis Active Problems:   Essential hypertension   GERD   Coronary artery disease   Leukocytosis   Hyperlipidemia   Obesity   Chronic diastolic CHF (congestive heart failure)   SOB (shortness of breath)  #1 probable pulmonary fibrosis flare Patient presenting with a worsening shortness of breath ongoing for 1 week with a productive cough of clear sputum. Patient stated that her chronic prednisone therapy was being trying to be weaned down and her prednisone was decreased to 5 mg from 10 mg in July 2016. Patient stated that she saw her lung doctor Dr. Melvyn Novas on Wednesday, 07/28/2015 was told to  increase her dose of prednisone to 10 mg daily. Patient was also ordered a trial pack oxygen however due to some confusion hasn't received that yet. Patient was given some IV Decadron as well as some oxygen and nebulizer treatments. Will admit the patient to telemetry. Place  patient on Solu-Medrol 40 mg IV every 6 hours. Will give a dose of Lasix 40 mg IV 1. Will also place on Augmentin, Tessalon Perles, scheduled nebulizer treatments. Will consult with pulmonary for further evaluation and management.  #2 leukocytosis Likely secondary to chronic steroids use. Urinalysis is not impressive for UTI. Chest x-ray done was negative for any acute pulmonary issues. Patient on chronic prednisone. Follow.  #3 gastroesophageal reflux disease PPI.  #4 hypertension Stable. Continue home regimen of ARB and diuretics.   # 5 chronic diastolic CHF Stable. Continue home regimen of Lasix.  #6 coronary artery disease Stable. Patient denies any chest pain. Troponin was less than 0.03.  #7 prophylaxis PPI for GI prophylaxis. Lovenox for DVT prophylaxis.   Code Status: DO NOT RESUSCITATE DVT Prophylaxis: Lovenox Family Communication:updated patient. No family present. Disposition Plan: Admit to telemetry  Time spent: 65 mins  [facility] MD [facility] Pager [phone]

## 2015-08-01 NOTE — ED Notes (Signed)
Per EMS , pt. From home with complaint of sob which got worsen as she woke up today at [place], denies chest pain , alert and oriented x3.  Pt. Been seen by her pulmonologist  for her chronic respiratory problem.

## 2015-08-01 NOTE — ED Provider Notes (Signed)
CSN: [id number]     Arrival date & time 08/01/15  0702 History   First MD Initiated Contact with Patient 08/01/15 [phone]     Chief Complaint  Patient presents with  . Shortness of Breath     (Consider location/radiation/quality/duration/timing/severity/associated sxs/prior Treatment) HPI Comments: Patient here complaining of increased shortness of breath. History of pulmonary fibrosis this is similar. Seen by her pulmonologist 4 days ago for similar symptoms and she is on chronic Synthroid therapy. Denies any anginal type chest pain. Does endorse increased dyspnea on exertion. Denies any leg swelling. No recent fever or chills. No vomiting or diarrhea. Symptoms are persistent and haven't been associated with generalized weakness. No urinary symptoms. Sx better with oxygen  Patient is a 81 y.o. female presenting with shortness of breath. The history is provided by the patient.  Shortness of Breath   Past Medical History  Diagnosis Date  . Dyspnea   . Diverticulosis 2008  . Internal hemorrhoids   . Esophageal stricture   . Arthritis   . Breast cancer 1969  . GERD (gastroesophageal reflux disease)   . Gallstones   . Depression   . HLD (hyperlipidemia)   . HTN (hypertension)   . Obesity   . Colon polyp 2013    TUBULAR ADENOMA (X1  . Pulmonary fibrosis   . CAD (coronary artery disease)    Past Surgical History  Procedure Laterality Date  . Abdominal hysterectomy    . Cholecystectomy    . Mastectomy      right  . Appendectomy    . Foot surgery    . Breast surgery     Family History  Problem Relation Age of Onset  . Heart disease Mother   . Cancer Mother     bladder  . Esophageal cancer Brother   . Prostate cancer Brother   . Heart disease Brother   . Cancer Brother     sarcoma  . Stomach cancer Neg Hx    Social History  Substance Use Topics  . Smoking status: Never Smoker   . Smokeless tobacco: Never Used  . Alcohol Use: 4.2 oz/week    7 Glasses of wine per week      Comment: ocaasional   OB History    No data available     Review of Systems  Respiratory: Positive for shortness of breath.   All other systems reviewed and are negative.     Allergies  Antihistamines, loratadine-type; Codeine; Darvocet; and Moxifloxacin  Home Medications   Prior to Admission medications   Medication Sig Start Date End Date Taking? Authorizing Provider  acetaminophen (TYLENOL 8 HOUR) 650 MG CR tablet Take 650 mg by mouth every 8 (eight) hours as needed.     Historical Provider, MD  aspirin EC 325 MG EC tablet Take 1 tablet (325 mg total) by mouth daily. 08/20/14   Marton Redwood, MD  atorvastatin (LIPITOR) 20 MG tablet Take 1 tablet (20 mg total) by mouth daily at 6 PM. 08/20/14   Marton Redwood, MD  cholecalciferol (VITAMIN D) 1000 UNITS tablet Take 1,000 Units by mouth daily.    Historical Provider, MD  esomeprazole (NEXIUM) 20 MG capsule Take 40 mg by mouth daily before breakfast.     Historical Provider, MD  fluticasone (FLONASE) 50 MCG/ACT nasal spray Place 2 sprays into both nostrils once as needed for allergies or rhinitis.    Historical Provider, MD  furosemide (LASIX) 20 MG tablet Take 20 mg by mouth 3 (three) times a  week.     Historical Provider, MD  guaiFENesin (MUCINEX) 600 MG 12 hr tablet Take 1 tablet (600 mg total) by mouth 2 (two) times daily. 08/20/14   Marton Redwood, MD  predniSONE (DELTASONE) 10 MG tablet TAKE 1 TABLET BY MOUTH EVERY MORNING 07/13/15   Tanda Rockers, MD  predniSONE (STERAPRED UNI-PAK 21 TAB) 5 MG (21) TBPK tablet Take 1 tablet (5 mg total) by mouth daily. Take as directed Patient not taking: Reported on 07/28/2015 07/01/15   Tanda Rockers, MD  sertraline (ZOLOFT) 100 MG tablet Take 100 mg by mouth daily.     Historical Provider, MD  sodium chloride (OCEAN) 0.65 % SOLN nasal spray Place 1 spray into both nostrils 2 (two) times daily. 08/20/14   Marton Redwood, MD  traMADol (ULTRAM) 50 MG tablet Take 50 mg by mouth every 6 (six) hours  as needed for moderate pain.     Historical Provider, MD  valsartan (DIOVAN) 320 MG tablet Take 1 tablet by mouth daily. 06/30/15   Historical Provider, MD   BP 139/61 mmHg  Pulse 93  Temp(Src) 98.3 F (36.8 C) (Oral)  Resp 25  SpO2 95% Physical Exam  Constitutional: She is oriented to person, place, and time. She appears well-developed and well-nourished.  Non-toxic appearance. No distress.  HENT:  Head: Normocephalic and atraumatic.  Eyes: Conjunctivae, EOM and lids are normal. Pupils are equal, round, and reactive to light.  Neck: Normal range of motion. Neck supple. No tracheal deviation present. No thyroid mass present.  Cardiovascular: Normal rate, regular rhythm and normal heart sounds.  Exam reveals no gallop.   No murmur heard. Pulmonary/Chest: Effort normal. No stridor. No respiratory distress. She has decreased breath sounds. She has no wheezes. She has rhonchi. She has no rales.  Abdominal: Soft. Normal appearance and bowel sounds are normal. She exhibits no distension. There is no tenderness. There is no rebound and no CVA tenderness.  Musculoskeletal: Normal range of motion. She exhibits no edema or tenderness.  Neurological: She is alert and oriented to person, place, and time. She has normal strength. No cranial nerve deficit or sensory deficit. GCS eye subscore is 4. GCS verbal subscore is 5. GCS motor subscore is 6.  Skin: Skin is warm and dry. No abrasion and no rash noted.  Psychiatric: She has a normal mood and affect. Her speech is normal and behavior is normal.  Nursing note and vitals reviewed.   ED Course  Procedures (including critical care time) Labs Review Labs Reviewed  CBC WITH DIFFERENTIAL/PLATELET - Abnormal; Notable for the following:    WBC 15.5 (*)    Neutro Abs 12.9 (*)    Monocytes Absolute 1.6 (*)    All other components within normal limits  BRAIN NATRIURETIC PEPTIDE  TROPONIN I  COMPREHENSIVE METABOLIC PANEL    Imaging Review No results  found. I have personally reviewed and evaluated these images and lab results as part of my medical decision-making.   EKG Interpretation None      MDM   Final diagnoses:  SOB (shortness of breath)    Patient given albuterol and Decadron here. Will be admitted for treatment of her pulmonary fibrosis    Lacretia Leigh, MD 08/01/15 [phone]

## 2015-08-01 NOTE — ED Notes (Signed)
Bed: WA09 Expected date: 08/01/15 Expected time: 6:56 AM Means of arrival: Ambulance Comments: 55+ yo [facility]

## 2015-08-01 NOTE — ED Notes (Signed)
Report to Sonia Baller, RN - pt transferred to [phone]

## 2015-08-01 NOTE — Progress Notes (Signed)
Received pt to care at 1530 from previous nurse. Assessment completed by this Probation officer. I agree with previous nurse's assessment.

## 2015-08-02 ENCOUNTER — Encounter: Payer: Self-pay | Admitting: Internal Medicine

## 2015-08-02 ENCOUNTER — Inpatient Hospital Stay (HOSPITAL_COMMUNITY): Payer: Medicare Other

## 2015-08-02 DIAGNOSIS — I509 Heart failure, unspecified: Secondary | ICD-10-CM

## 2015-08-02 DIAGNOSIS — J9621 Acute and chronic respiratory failure with hypoxia: Secondary | ICD-10-CM

## 2015-08-02 LAB — CBC
HCT: 38.9 % (ref 36.0–46.0)
Hemoglobin: 12.9 g/dL (ref 12.0–15.0)
MCH: 30 pg (ref 26.0–34.0)
MCHC: 33.2 g/dL (ref 30.0–36.0)
MCV: 90.5 fL (ref 78.0–100.0)
PLATELETS: 321 10*3/uL (ref 150–400)
RBC: 4.3 MIL/uL (ref 3.87–5.11)
RDW: 14.2 % (ref 11.5–15.5)
WBC: 16.6 10*3/uL — AB (ref 4.0–10.5)

## 2015-08-02 LAB — BASIC METABOLIC PANEL
Anion gap: 8 (ref 5–15)
BUN: 12 mg/dL (ref 6–20)
CALCIUM: 9.4 mg/dL (ref 8.9–10.3)
CHLORIDE: 100 mmol/L — AB (ref 101–111)
CO2: 26 mmol/L (ref 22–32)
CREATININE: 0.57 mg/dL (ref 0.44–1.00)
Glucose, Bld: 154 mg/dL — ABNORMAL HIGH (ref 65–99)
Potassium: 4.4 mmol/L (ref 3.5–5.1)
SODIUM: 134 mmol/L — AB (ref 135–145)

## 2015-08-02 LAB — INFLUENZA PANEL BY PCR (TYPE A & B)
H1N1 flu by pcr: NOT DETECTED
Influenza A By PCR: NEGATIVE
Influenza B By PCR: NEGATIVE

## 2015-08-02 LAB — TROPONIN I
TROPONIN I: 0.03 ng/mL (ref <0.031)
TROPONIN I: 0.15 ng/mL — AB (ref <0.031)

## 2015-08-02 LAB — GLUCOSE, CAPILLARY: GLUCOSE-CAPILLARY: 143 mg/dL — AB (ref 65–99)

## 2015-08-02 LAB — URINE CULTURE

## 2015-08-02 MED ORDER — IPRATROPIUM-ALBUTEROL 0.5-2.5 (3) MG/3ML IN SOLN
3.0000 mL | Freq: Three times a day (TID) | RESPIRATORY_TRACT | Status: DC
  Administered 2015-08-02 – 2015-08-05 (×8): 3 mL via RESPIRATORY_TRACT
  Filled 2015-08-02 (×10): qty 3

## 2015-08-02 MED ORDER — ZOLPIDEM TARTRATE 5 MG PO TABS
5.0000 mg | ORAL_TABLET | Freq: Once | ORAL | Status: AC
  Administered 2015-08-02: 5 mg via ORAL
  Filled 2015-08-02: qty 1

## 2015-08-02 MED ORDER — PREDNISONE 20 MG PO TABS
40.0000 mg | ORAL_TABLET | Freq: Every day | ORAL | Status: DC
  Administered 2015-08-02 – 2015-08-05 (×4): 40 mg via ORAL
  Filled 2015-08-02 (×4): qty 2

## 2015-08-02 MED ORDER — FUROSEMIDE 10 MG/ML IJ SOLN
40.0000 mg | Freq: Two times a day (BID) | INTRAMUSCULAR | Status: DC
  Administered 2015-08-02 – 2015-08-05 (×7): 40 mg via INTRAVENOUS
  Filled 2015-08-02 (×7): qty 4

## 2015-08-02 NOTE — Assessment & Plan Note (Addendum)
PFT's 03/10/10 FEV1 1.68 (130%) ratio 74 , DLCO 48% (vs 45% 04/22/07) corrects to 60%  See inpt adm 08/15/14  -CT 08/15/14 >> moderate CAD, UIP pattern of pulmonary fibrosis with b/l areas of GGO, BTX RUL  -ESR 08/16/14 >> 67 > started systemic steroids as inpt -Echo 08/16/14 >> mild LVH, EF 60 to 44%, diastolic dysfx  -Labs 62/86/38 >> ANA negative, RF 32, anti-CCP negative, SS-A/SS-B negative, SCL-70 negative  -PFT 08/17/14 >> FEV1 1.75 (115%), FEV1% 84, TLC 3.39 (73%), DLCO 45% - 08/31/14   Walked RA x 3 laps @ 185 ft each stopped due to  92% slow pace @ 40 mg per day> rec reduce to 20 mg daily  - 09/28/2014  Walked RA x 3 laps @ 185 ft each stopped due to  Slow to mod pace, some sob at end but sats 94%  - reduced pred to 10 mg daily 09/28/2014 > worse starting 10/14/14 so recheck esr 10/20/2014  - 10/20/2014  Walked RA x 3 laps @ 185 ft each stopped due to end of study, slow to mod pace, no sob or desats on 10 mg daily pred - 11/09/2014   Trial of prednisone 5 mg daily > flared in terms of arthritis > resumed 10 mg per day since around 12/07/14  - 03/31/2015  Walked RA x 3 laps @ 185 ft each stopped due to  End of study, some sob but no desat at nl pace at 10 mg pred per day  - 07/01/2015  Walked RA x 3 laps @ 185 ft each stopped due to min sob/ nl pace no desat    At a dose of 10 mg daily until 06/27/15 then changed herself to 5 mg qod> rec slow taper back to same floor - 07/28/2015   Walked RA  2 laps @ 185 ft each stopped due to  Fatigue/ nl pace, 86%  @ 10 mg daily > rec amb 02 (see ex hypoxemia)   Clearly her symptoms are pred dependent with flare arthritis and sob at < 10 mg daily suggesting underlying collagen vasc dz  The goal with a chronic steroid dependent illness is always arriving at the lowest effective dose that controls the disease/symptoms and not accepting a set "formula" which is based on statistics or guidelines that don't always take into account patient  variability or the natural hx  of the dz in every individual patient, which may well vary over time.  For now therefore I recommend the patient maintain  A floor of 10 mg daily for now with ceiling of 20 mg per day if worse ? Needs rheum eval next

## 2015-08-02 NOTE — Progress Notes (Signed)
  Echocardiogram 2D Echocardiogram has been performed.  [name] 08/02/2015, 3:51 PM

## 2015-08-02 NOTE — Progress Notes (Signed)
[facility] PROGRESS NOTE  [name] WCH:[id number] DOB: 08/07/1928 DOA: 08/01/2015 PCP: Marton Redwood, MD  Assessment/Plan: #1 pulmonary fibrosis Patient presenting with complaints of shortness of breath with cough. Chest x-ray was negative for any acute infiltrate. Likely a flare of her pulmonary fibrosis. Patient with some clinical improvement. Continue IV Solu-Medrol, Augmentin, oxygen, nebulizer treatments, Tessalon Perles, Protonix. Pulmonary following and appreciate input and recommendations.  #2 acute on chronic diastolic heart failure Some clinical improvement. Cycle cardiac enzymes every 6 hours 3. Check a 2-D echo. Strict I's and O's. Daily weights. Place on Lasix 40 mg IV every 12 hours. Continue Avapro. Follow.  #3 gastroesophageal reflux disease  PPI.  #4 hypertension Continue Avapro and Lasix.  #5 coronary artery disease Stable. Continue Avapro, Lasix, aspirin.  #6 leukocytosis Likely steroid-induced. Chest x-ray negative for any acute infiltrates. UA is nitrite negative trace leukocytes 0-2 WBCs. Patient is currently afebrile. Patient on oral Augmentin. Follow.  #7 prophylaxis PPI for GI prophylaxis. Lovenox for DVT prophylaxis.  Code Status: DO NOT RESUSCITATE Family Communication: updated patient, no family at bedside. Disposition Plan: Remain on telemetry.   Consultants:  Pulmonary: Dr. Halford Chessman 08/01/2015  Procedures:  Chest x-ray 08/01/2015    Antibiotics:  Augmentin 08/01/2015  HPI/Subjective: Patient states she's feeling somewhat better. Still with cough. Patient noted to desat of O2 into the 80s on ambulation.  Objective: Filed Vitals:   08/02/15 0509  BP: 135/63  Pulse: 84  Temp: 97.8 F (36.6 C)  Resp: 18    Intake/Output Summary (Last 24 hours) at 08/02/15 0940 Last data filed at 08/02/15 0841  Gross per 24 hour  Intake   1380 ml  Output    300 ml  Net   1080 ml   Filed Weights   08/01/15 1028 08/01/15 1036 08/02/15  0509  Weight: 73.2 kg (161 lb 6 oz) 70.2 kg (154 lb 12.2 oz) 71.4 kg (157 lb 6.5 oz)    Exam:   General:  NAD  Cardiovascular: RRR  Respiratory: Some inspiratory crackles. Some coarse breath sounds.  Abdomen: Soft, nontender, nondistended, positive bowel sounds.  Musculoskeletal: No clubbing cyanosis or edema.  Data Reviewed: Basic Metabolic Panel:  Recent Labs Lab 08/01/15 0745 08/02/15 0431  NA 131* 134*  K 4.3 4.4  CL 99* 100*  CO2 25 26  GLUCOSE 106* 154*  BUN 10 12  CREATININE 0.64 0.57  CALCIUM 9.3 9.4  MG 2.0  --    Liver Function Tests:  Recent Labs Lab 08/01/15 0745  AST 47*  ALT 45  ALKPHOS 149*  BILITOT 1.1  PROT 6.9  ALBUMIN 3.3*   No results for input(s): LIPASE, AMYLASE in the last 168 hours. No results for input(s): AMMONIA in the last 168 hours. CBC:  Recent Labs Lab 08/01/15 0745 08/02/15 0431  WBC 15.5* 16.6*  NEUTROABS 12.9*  --   HGB 13.4 12.9  HCT 40.9 38.9  MCV 90.3 90.5  PLT 302 321   Cardiac Enzymes:  Recent Labs Lab 08/01/15 0745 08/02/15 0815  TROPONINI <0.03 <0.03   BNP (last 3 results)  Recent Labs  08/01/15 0745  BNP 250.1*    ProBNP (last 3 results)  Recent Labs  08/15/14 1457 08/16/14 0336 08/17/14 0515  PROBNP 1859.0* 1717.0* 802.7*    CBG:  Recent Labs Lab 08/02/15 0731  GLUCAP 143*    No results found for this or any previous visit (from the past 240 hour(s)).   Studies: Dg Chest 2 View  08/01/2015  CLINICAL DATA:  Productive cough, shortness of breath, pulmonary fibrosis  EXAM: CHEST  2 VIEW  COMPARISON:  07/28/2015  FINDINGS: Right upper lobe and bilateral lower lobe scarring/fibrosis. No superimposed opacities suspicious for pneumonia.  The heart is normal in size.  Mild degenerative changes of the visualized thoracolumbar spine.  IMPRESSION: Chronic scarring/fibrosis in the right upper lobe and bilateral lower lobes.  No evidence of acute cardiopulmonary disease.   Electronically  Signed   By: Julian Hy M.D.   On: 08/01/2015 08:21    Scheduled Meds: . amoxicillin-clavulanate  1 tablet Oral Q12H  . aspirin EC  325 mg Oral Daily  . benzonatate  200 mg Oral TID  . cholecalciferol  1,000 Units Oral Daily  . enoxaparin (LOVENOX) injection  40 mg Subcutaneous Q24H  . furosemide  40 mg Intravenous Q12H  . ipratropium-albuterol  3 mL Nebulization TID  . irbesartan  300 mg Oral Daily  . methylPREDNISolone (SOLU-MEDROL) injection  40 mg Intravenous Q6H  . pantoprazole  80 mg Oral Q1200  . sertraline  100 mg Oral Daily  . sodium chloride  1 spray Each Nare BID  . sodium chloride  3 mL Intravenous Q12H  . sodium chloride  3 mL Intravenous Q12H   Continuous Infusions: . sodium chloride 20 mL/hr at 08/01/15 1202    Principal Problem:   Pulmonary fibrosis Active Problems:   Essential hypertension   GERD   Diastolic dysfunction with acute on chronic heart failure   Coronary artery disease   Leukocytosis   Hyperlipidemia   Obesity   Chronic diastolic CHF (congestive heart failure)   SOB (shortness of breath)    Time spent: 35 minutes    [facility] MD [facility] Pager [phone]. If 7PM-7AM, please contact night-coverage at [email or web address], password [facility] 08/02/2015, 9:40 AM  LOS: 1 day

## 2015-08-02 NOTE — Care Management Note (Signed)
Case Management Note  Patient Details  Name: [name] MRN: [id number] Date of Birth: 22-May-1928  Subjective/Objective: 81 y/o f admitted w/PNA. From home.Current 02 sats qualify for home 02.If needed @ d/c, can arrange w/qualifying 02 sats,& home 02 order.  PT/OT-recc HHC. Will provide [place] agency list in am.Await HHPT/HHOT order, face to face.                 Action/Plan:d/c home w/HHC.   Expected Discharge Date:  08/03/15               Expected Discharge Plan:  [place]  In-House Referral:     Discharge planning Services  CM Consult  Post Acute Care Choice:    Choice offered to:     DME Arranged:    DME Agency:     HH Arranged:    HH Agency:     Status of Service:  In process, will continue to follow  Medicare Important Message Given:    Date Medicare IM Given:    Medicare IM give by:    Date Additional Medicare IM Given:    Additional Medicare Important Message give by:     If discussed at [place] of Stay Meetings, dates discussed:    Additional Comments:  Dessa Phi, RN 08/02/2015, 5:58 PM

## 2015-08-02 NOTE — Clinical Documentation Improvement (Signed)
Internal Medicine  Abnormal Lab/Test Results:  **Lab has reported the following sodium values for this patient this admission: *131, 134  Possible Clinical Conditions associated with below indicators  *Hyponatremia  *Hypernatremia  *Normal sodium values  Other Condition  Cannot Clinically Determine    Please exercise your independent, professional judgment when responding. A specific answer is not anticipated or expected.   Thank You,  [place] [phone]

## 2015-08-02 NOTE — Progress Notes (Signed)
PCCM PROGRESS NOTE  ADMISSION DATE: 08/01/2015 CONSULT DATE: [id number] REFERRING PROVIDER: [facility]  CC: Progressive fatigue  HPI: 81 y/o female followed by Dr. Melvyn Novas in pulmonary office for steroid responsive pulmonary fibrosis.  She has not been feeling well for the past several days.  She has noticed more cough.  She is bringing up clear, thick phlegm.  She gets coughing spells when she takes a deep breath.  She denies hemoptysis, and has not noticed fever.  She denies chest pain.  She has nasal congestion with post-nasal drip, but this is chronic and no worse than usual.  She has been feeling more fatigued.  She didn't notice the typical improvement when she takes more prednisone.  She has noticed more burning with urination, and thinks she might have a bladder infection.    SUBJECTIVE:  Pt reports feeling tired but no increase in WOB.  Concerned about going home as she lives alone.  Episode of desaturation into the 80's while off O2 walking to the bathroom.   OBJECTIVE: Temp:  [97.8 F (36.6 C)-98.9 F (37.2 C)] 97.8 F (36.6 C) (09/26 0509) Pulse Rate:  [80-97] 84 (09/26 0509) Resp:  [18] 18 (09/26 0509) BP: (124-135)/(59-63) 135/63 mmHg (09/26 0509) SpO2:  [97 %-98 %] 98 % (09/26 1348) Weight:  [157 lb 6.5 oz (71.4 kg)] 157 lb 6.5 oz (71.4 kg) (09/26 0509)   General: pleasant, wearing oxygen HEENT: pupils reactive, no sinus tenderness, clear nasal discharge, no oral exudate Cardiac: regular, no murmur Chest: bilateral dry crackles, no wheeze Abd: soft, non tender Ext: no edema, limited ROM of Rt shoulder Neuro: CN intact Skin: no rashes   CMP Latest Ref Rng 08/02/2015 08/01/2015 10/20/2014  Glucose 65 - 99 mg/dL 154(H) 106(H) 144(H)  BUN 6 - 20 mg/dL 12 10 12   Creatinine 0.44 - 1.00 mg/dL 0.57 0.64 0.7  Sodium 135 - 145 mmol/L 134(L) 131(L) 131(L)  Potassium 3.5 - 5.1 mmol/L 4.4 4.3 4.7  Chloride 101 - 111 mmol/L 100(L) 99(L) 100  CO2 22 - 32 mmol/L 26 25 25   Calcium 8.9 -  10.3 mg/dL 9.4 9.3 10.4  Total Protein 6.5 - 8.1 g/dL - 6.9 -  Total Bilirubin 0.3 - 1.2 mg/dL - 1.1 -  Alkaline Phos 38 - 126 U/L - 149(H) -  AST 15 - 41 U/L - 47(H) -  ALT 14 - 54 U/L - 45 -     CBC Latest Ref Rng 08/02/2015 08/01/2015 10/20/2014  WBC 4.0 - 10.5 K/uL 16.6(H) 15.5(H) 9.0  Hemoglobin 12.0 - 15.0 g/dL 12.9 13.4 14.5  Hematocrit 36.0 - 46.0 % 38.9 40.9 43.5  Platelets 150 - 400 K/uL 321 302 262.0     Dg Chest 2 View  08/01/2015   CLINICAL DATA:  Productive cough, shortness of breath, pulmonary fibrosis  EXAM: CHEST  2 VIEW  COMPARISON:  07/28/2015  FINDINGS: Right upper lobe and bilateral lower lobe scarring/fibrosis. No superimposed opacities suspicious for pneumonia.  The heart is normal in size.  Mild degenerative changes of the visualized thoracolumbar spine.  IMPRESSION: Chronic scarring/fibrosis in the right upper lobe and bilateral lower lobes.  No evidence of acute cardiopulmonary disease.   Electronically Signed   By: Julian Hy M.D.   On: 08/01/2015 08:21     CULTURES: Urine 9/25 >>   ANTIBIOTICS: Augmentin 9/25 >>  STUDIES: CT chest 08/15/14 >> b/l GGO, BTX RUL, severe interstitial changes b/l with UIP pattern Echo 08/16/14 >> mild LVH, EF 60 to 65%, grade  1 diastolic dysfx PFT 13/08/65 >> FEV1 1.75 (115%), FEV1% 84, TLC 3.39 (73%), DLCO 45%, no BD  SEROLOGY from 08/17/14: ANA >> negative Rheumatoid factor >> 32  Aldolase >> 8.9 ANCA >> negative SS-A, SS-B >> negative Scl 70 >> negative Anti CCP >> negative  EVENTS: 9/25  Admit  9/26  Feels almost back to baseline.    DISCUSSION: 81 y/o female with hx of steroid responsive pulmonary fibrosis presents with several days of fatigue, dysuria, dyspnea, and cough.  Concern is that she has a UTI.  She might also have flare of her pulmonary fibrosis +/- acute bronchitis and pulmonary edema.   ASSESSMENT/PLAN:  Acute on chronic respiratory failure with hypoxia >> infection vs edema vs flare of  pulmonary fibrosis.  Suspected fibrosis flare.   Plan: - solumedrol 40 mg q6h >> likely can quickly transition back to prednisone, baseline 10 mg.  Plan for 40mg  QD until seen in office - lasix per primary team - day 2 of augmentin - adjust oxygen to keep SpO2 90 to 95% - assess ambulatory saturations prior to discharge to determine flow needs - follow up with Dr. Melvyn Novas arranged - consider d/c home with PT   Acute on chronic diastolic dysfx. Plan: - BP management and diuresis per primary team   UTI. Plan: - day 2 augmentin - f/u urine cx   Goals of Care >> DNR.   Anticipated discharge in am per primary SVC (per patient report).  PCCM will be available PRN.  Please call with needs.   Noe Gens, NP-C [facility] Pgr: [phone] or if no answer [phone] 08/02/2015, 2:34 PM

## 2015-08-02 NOTE — Assessment & Plan Note (Signed)
07/28/2015   Walked RA x one lap @ 185 stopped due to  desat to 86% nl pace 07/28/2015   Walked   2lpm   2 laps @ 185 ft each stopped due to fatigue, no sob or desat > rec 02 with walking only

## 2015-08-02 NOTE — Evaluation (Signed)
Occupational Therapy Evaluation Patient Details Name: [name] MRN: [id number] DOB: 09/17/1928 Today's Date: 08/02/2015    History of Present Illness 81 y.o. female with history of pulmonary fibrosis followed by Dr. Melvyn Novas of [facility], who presents to the ED with a one-week history of worsening shortness of breath, generalized fatigue, productive cough of clear sputum, wheezing.    Clinical Impression   Pt up to use walker to transfer with min guard assist today. Will benefit from Ot to continue with energy conservation education and progress safety and independence with self care tasks. Will follow. HR with activity was 121. HR at rest at start of session 107. O2 on 2L with activity at 93%.     Follow Up Recommendations  Home health OT;Supervision - Intermittent;Other (comment) (Hazard aide)    Equipment Recommendations  None recommended by OT    Recommendations for Other Services       Precautions / Restrictions Precautions Precaution Comments: O2 Restrictions Weight Bearing Restrictions: No      Mobility Bed Mobility Overal bed mobility: Modified Independent             General bed mobility comments: with rail, HOB up  Transfers Overall transfer level: Needs assistance Equipment used: Rolling walker (2 wheeled) Transfers: Sit to/from Stand Sit to Stand: Min guard         General transfer comment: verbal cues for hand placement    Balance Overall balance assessment: Modified Independent                                          ADL Overall ADL's : Needs assistance/impaired Eating/Feeding: Independent;Sitting   Grooming: Wash/dry hands;Set up;Sitting   Upper Body Bathing: Set up;Sitting   Lower Body Bathing: Min guard;Sit to/from stand   Upper Body Dressing : Set up;Sitting   Lower Body Dressing: Min guard;Sit to/from stand Lower Body Dressing Details (indicate cue type and reason): able to slide on shoes. no  ties. Toilet Transfer: Min guard;Ambulation;RW   Toileting- Water quality scientist and Hygiene: Min guard;Sit to/from stand         General ADL Comments: Pt is noted to get SOB with activity - 2/4 dyspnea with activity. Will benefit from energy conservation handout and education. She has a tubseat and toilet riser but states she has not had to use them in the past. Will likely benefit from use of shower chair now for energy conservation purposes. She states it would be helpful to have initial assist with household tasks and showering-will recommend [facility] aide. Pt moves well and walker seemed to help also with energy conservation.      Vision     Perception     Praxis      Pertinent Vitals/Pain Pain Assessment: No/denies pain     Hand Dominance     Extremity/Trunk Assessment Upper Extremity Assessment Upper Extremity Assessment: Overall WFL for tasks assessed      Cervical / Trunk Assessment Cervical / Trunk Assessment: Normal   Communication Communication Communication: No difficulties   Cognition Arousal/Alertness: Awake/alert Behavior During Therapy: WFL for tasks assessed/performed Overall Cognitive Status: Within Functional Limits for tasks assessed                     General Comments       Exercises       Shoulder Instructions      Home  Living Family/patient expects to be discharged to:: Private residence Living Arrangements: Alone Available Help at Discharge: Family;Friend(s);Available PRN/intermittently   Home Access: Stairs to enter Entrance Stairs-Number of Steps: 2 Entrance Stairs-Rails: Left Home Layout: One level     Bathroom Shower/Tub: Teacher, early years/pre: Handicapped height     Home Equipment: Environmental consultant - 2 wheels;Cane - single point;Grab bars - tub/shower;Shower seat;Toilet riser   Additional Comments: doesn't use toilet riser or shower seat, one commode is standard, one is handicapped height      Prior  Functioning/Environment Level of Independence: Independent        Comments: drives, walks short distances due to SOB, denies h/o falls    OT Diagnosis: Generalized weakness   OT Problem List: Decreased strength;Decreased knowledge of use of DME or AE;Decreased activity tolerance   OT Treatment/Interventions: Self-care/ADL training;Patient/family education;Therapeutic activities;DME and/or AE instruction    OT Goals(Current goals can be found in the care plan section) Acute Rehab OT Goals Patient Stated Goal: to be able to do her housework and shop OT Goal Formulation: With patient Time For Goal Achievement: 08/16/15 Potential to Achieve Goals: Good  OT Frequency: Min 2X/week   Barriers to D/C:            Co-evaluation              End of Session Equipment Utilized During Treatment: Rolling walker;Oxygen  Activity Tolerance: Patient tolerated treatment well (some fatigue) Patient left: in bed;with call Alfonse Garringer/phone within reach   Time: 1216-1240 OT Time Calculation (min): 24 min Charges:  OT General Charges $OT Visit: 1 Procedure OT Evaluation $Initial OT Evaluation Tier I: 1 Procedure G-Codes:    Jules Schick  [phone] 08/02/2015, 1:22 PM

## 2015-08-02 NOTE — Evaluation (Signed)
Physical Therapy Evaluation Patient Details Name: [name] MRN: [id number] DOB: 1928/09/11 Today's Date: 08/02/2015   History of Present Illness  81 y.o. female with history of pulmonary fibrosis followed by Dr. Melvyn Novas of [facility], who presents to the ED with a one-week history of worsening shortness of breath, generalized fatigue, productive cough of clear sputum, wheezing.   Clinical Impression  Pt admitted with above diagnosis. Pt currently with functional limitations due to the deficits listed below (see PT Problem List). Pt ambulated 140' with RW and 2L O2, SaO2 93% while walking, HR 121.  Pt will benefit from skilled PT to increase their independence and safety with mobility to allow discharge to the venue listed below.       Follow Up Recommendations Home health PT    Equipment Recommendations  Will likely need home O2    Recommendations for Other Services       Precautions / Restrictions Precautions Precaution Comments: O2 Restrictions Weight Bearing Restrictions: No      Mobility  Bed Mobility Overal bed mobility: Modified Independent             General bed mobility comments: with rail, HOB up  Transfers Overall transfer level: Needs assistance Equipment used: Rolling walker (2 wheeled) Transfers: Sit to/from Stand Sit to Stand: Min guard         General transfer comment: verbal cues for hand placement  Ambulation/Gait Ambulation/Gait assistance: Min guard Ambulation Distance (Feet): 140 Feet Assistive device: Rolling walker (2 wheeled) Gait Pattern/deviations: Step-through pattern;Decreased stride length   Gait velocity interpretation: at or above normal speed for age/gender General Gait Details: verbal cues for positioning in RW, SaO2 93% on 2L O2 walking (per RN SaO2 70's-80% on RA with walk to bathroom earlier this morning), HR 121 with walking, 107 at rest  Stairs            Wheelchair Mobility    Modified Rankin (Stroke  Patients Only)       Balance Overall balance assessment: Modified Independent                                           Pertinent Vitals/Pain Pain Assessment: No/denies pain    Home Living Family/patient expects to be discharged to:: Private residence Living Arrangements: Alone Available Help at Discharge: Family;Friend(s);Available PRN/intermittently   Home Access: Stairs to enter Entrance Stairs-Rails: Left Entrance Stairs-Number of Steps: 2 Home Layout: One level Home Equipment: Walker - 2 wheels;Cane - single point;Grab bars - tub/shower;Shower seat;Toilet riser Additional Comments: doesn't use toilet riser or shower seat, one commode is standard, one is handicapped height    Prior Function Level of Independence: Independent         Comments: drives, walks short distances due to SOB, denies h/o falls     Hand Dominance        Extremity/Trunk Assessment   Upper Extremity Assessment: Defer to OT evaluation           Lower Extremity Assessment: Overall WFL for tasks assessed      Cervical / Trunk Assessment: Normal  Communication   Communication: No difficulties  Cognition Arousal/Alertness: Awake/alert Behavior During Therapy: WFL for tasks assessed/performed Overall Cognitive Status: Within Functional Limits for tasks assessed                      General Comments  Exercises        Assessment/Plan    PT Assessment Patient needs continued PT services  PT Diagnosis Generalized weakness   PT Problem List Decreased activity tolerance;Cardiopulmonary status limiting activity  PT Treatment Interventions Gait training;Functional mobility training;Therapeutic activities;Patient/family education;Therapeutic exercise   PT Goals (Current goals can be found in the Care Plan section) Acute Rehab PT Goals Patient Stated Goal: to be able to do her housework and shop PT Goal Formulation: With patient Time For Goal  Achievement: 08/16/15 Potential to Achieve Goals: Fair    Frequency Min 3X/week   Barriers to discharge        Co-evaluation               End of Session Equipment Utilized During Treatment: Oxygen Activity Tolerance: Patient tolerated treatment well Patient left: in bed;with call bell/phone within reach Nurse Communication: Mobility status         Time: 1779-3903 PT Time Calculation (min) (ACUTE ONLY): 24 min   Charges:   PT Evaluation $Initial PT Evaluation Tier I: 1 Procedure     PT G CodesPhilomena Suarez 08/02/2015, 12:59 PM [phone]

## 2015-08-03 DIAGNOSIS — E871 Hypo-osmolality and hyponatremia: Secondary | ICD-10-CM | POA: Diagnosis present

## 2015-08-03 LAB — CBC
HCT: 38.2 % (ref 36.0–46.0)
Hemoglobin: 12.5 g/dL (ref 12.0–15.0)
MCH: 29.6 pg (ref 26.0–34.0)
MCHC: 32.7 g/dL (ref 30.0–36.0)
MCV: 90.3 fL (ref 78.0–100.0)
Platelets: 341 10*3/uL (ref 150–400)
RBC: 4.23 MIL/uL (ref 3.87–5.11)
RDW: 14.5 % (ref 11.5–15.5)
WBC: 22.5 10*3/uL — ABNORMAL HIGH (ref 4.0–10.5)

## 2015-08-03 LAB — BASIC METABOLIC PANEL
Anion gap: 7 (ref 5–15)
BUN: 16 mg/dL (ref 6–20)
CALCIUM: 9.1 mg/dL (ref 8.9–10.3)
CO2: 29 mmol/L (ref 22–32)
CREATININE: 0.66 mg/dL (ref 0.44–1.00)
Chloride: 97 mmol/L — ABNORMAL LOW (ref 101–111)
GFR calc non Af Amer: >60 mL/min (ref >60)
Glucose, Bld: 131 mg/dL — ABNORMAL HIGH (ref 65–99)
Potassium: 4.5 mmol/L (ref 3.5–5.1)
SODIUM: 133 mmol/L — AB (ref 135–145)

## 2015-08-03 LAB — MAGNESIUM: MAGNESIUM: 2.1 mg/dL (ref 1.7–2.4)

## 2015-08-03 LAB — BRAIN NATRIURETIC PEPTIDE: B Natriuretic Peptide: 278.6 pg/mL — ABNORMAL HIGH (ref 0.0–100.0)

## 2015-08-03 LAB — TROPONIN I
Troponin I: 0.17 ng/mL — ABNORMAL HIGH (ref <0.031)
Troponin I: 0.16 ng/mL — ABNORMAL HIGH (ref <0.031)

## 2015-08-03 LAB — GLUCOSE, CAPILLARY: Glucose-Capillary: 112 mg/dL — ABNORMAL HIGH (ref 65–99)

## 2015-08-03 MED ORDER — ZOLPIDEM TARTRATE 5 MG PO TABS
5.0000 mg | ORAL_TABLET | Freq: Every evening | ORAL | Status: DC | PRN
  Administered 2015-08-03 – 2015-08-05 (×3): 5 mg via ORAL
  Filled 2015-08-03 (×3): qty 1

## 2015-08-03 NOTE — Care Management Important Message (Signed)
Important Message  Patient Details  Name: [name] MRN: [id number] Date of Birth: 06-Dec-1927   Medicare Important Message Given:  Yes-second notification given    Camillo Flaming 08/03/2015, 11:55 [place] Message  Patient Details  Name: [name] MRN: [id number] Date of Birth: [birth year]   Medicare Important Message Given:  Yes-second notification given    Camillo Flaming 08/03/2015, 11:54 AM

## 2015-08-03 NOTE — Progress Notes (Signed)
Occupational Therapy Treatment Patient Details Name: [name] MRN: [id number] DOB: [birth year] Today's Date: 08/03/2015    History of present illness 81 y.o. female with history of pulmonary fibrosis followed by Dr. Melvyn Novas of [facility], who presents to the ED with a one-week history of worsening shortness of breath, generalized fatigue, productive cough of clear sputum, wheezing.    OT comments  Education provided during session and energy conservation handout given to pt. Pt progressing.  Follow Up Recommendations  No OT follow up;Supervision - Intermittent    Equipment Recommendations  None recommended by OT    Recommendations for Other Services      Precautions / Restrictions Precautions Precaution Comments: O2 Restrictions Weight Bearing Restrictions: No       Mobility Bed Mobility Overal bed mobility: Needs Assistance Bed Mobility: Supine to Sit;Sit to Supine     Supine to sit: Supervision Sit to supine: Modified independent (Device/Increase time)      Transfers Overall transfer level: Needs assistance   Transfers: Sit to/from Stand Sit to Stand: Supervision         General transfer comment: cue for hand placement with RW.     Balance      No LOB in session. Ambulated with and without RW.                             ADL Overall ADL's : Needs assistance/impaired     Grooming: Oral care;Supervision/safety;Standing;Brushing hair (dryed hand) Grooming Details (indicate cue type and reason): pt able to go and retrieve comb from chair at supervision level; other items at sink     Lower Body Bathing: Supervison/ safety (standing;feel pt could have retrieved items with supervision) Lower Body Bathing Details (indicate cue type and reason): washed bottom     Lower Body Dressing: Modified independent;Sitting/lateral leans (donned/doffed shoes)   Toilet Transfer: Supervision/safety;Ambulation;RW (ambulated with and without RW; sit to  stand from bed)           Functional mobility during ADLs: Supervision/safety;Rolling walker (with and without RW) General ADL Comments: Educated on energy conservation techniques and handout provided. Educated on safety such as sitting for LB bathing.      Vision                     Perception     Praxis      Cognition  Awake/Alert  Behavior During Therapy: WFL for tasks assessed/performed Overall Cognitive Status: Within Functional Limits for tasks assessed                       Extremity/Trunk Assessment               Exercises     Shoulder Instructions       General Comments      Pertinent Vitals/ Pain       Pain Assessment: No/denies pain; Pt on 1L of O2 in session. Noted pt's O2 dropping to 89% 1x but just very briefly, and trended up to 90s.  Home Living                                          Prior Functioning/Environment              Frequency Min 2X/week     Progress Toward Goals  OT Goals(current goals can now be found in the care plan section)  Progress towards OT goals: Progressing toward goals-updated some goals due to progress  Acute Rehab OT Goals Patient Stated Goal: not stated OT Goal Formulation: With patient Time For Goal Achievement: 08/16/15 Potential to Achieve Goals: Good ADL Goals Pt Will Perform Grooming: with modified independence;standing Pt Will Transfer to Toilet: with modified independence;ambulating;bedside commode Pt Will Perform Toileting - Clothing Manipulation and hygiene: with modified independence;sit to/from stand Pt Will Perform Tub/Shower Transfer: Tub transfer;with supervision;shower seat Additional ADL Goal #1: Pt will perform bathing and dressing routine including gathering items at modified independent level Additional ADL Goal #2: Pt will verbalize 2 energy conservation strategies independently.  Plan Discharge plan needs to be updated    Co-evaluation                  End of Session Equipment Utilized During Treatment: Rolling walker;Oxygen   Activity Tolerance Patient tolerated treatment well   Patient Left in bed;with call bell/phone within reach;with bed alarm set   Nurse Communication          Time: 6734-1937 OT Time Calculation (min): 17 min  Charges: OT General Charges $OT Visit: 1 Procedure OT Treatments $Self Care/Home Management : 8-22 mins  Benito Mccreedy OTR/L [phone] 08/03/2015, 5:37 PM

## 2015-08-03 NOTE — Progress Notes (Signed)
[facility] PROGRESS NOTE  JOCELIN SCHUELKE SNK:[id number] DOB: September 08, 1928 DOA: 08/01/2015 PCP: Marton Redwood, MD  Assessment/Plan: #1 pulmonary fibrosis Patient presenting with complaints of shortness of breath with cough. Chest x-ray was negative for any acute infiltrate. Likely a flare of her pulmonary fibrosis. Patient with some clinical improvement. IV Solu-Medrol has been transitioned to oral prednisone. Continue Augmentin, oxygen, nebulizer treatments, Tessalon Perles, Protonix. Pulmonary following and appreciate input and recommendations.  #2 acute on chronic diastolic heart failure Some clinical improvement. Cardiac enzymes slightly elevated have her symptoms to a plateaued and likely secondary to CHF exacerbation. 2-D echo with EF 65-70%, NWMA. -1.054 L over the past 24 hours. Patient's current weight is 70.3 kg from 73.2 kg on admission. Continue IV Lasix. Continue Avapro. Strict I's and O's. Daily weights. Follow.  #3 gastroesophageal reflux disease  PPI.  #4 hypertension Continue Avapro and Lasix.  #5 coronary artery disease Stable. Continue Avapro, Lasix, aspirin.  #6 leukocytosis Likely steroid-induced. Chest x-ray negative for any acute infiltrates. UA is nitrite negative trace leukocytes 0-2 WBCs. Patient is currently afebrile. Patient on oral Augmentin. Follow.  #7 chronic hyponatremia Stable.  #8 elevated troponins Likely secondary to problem #2. Troponins have plateaued. 2-D echo with no significant change from prior 2-D echo. EF of 65-70% with no wall motion abnormalities. Continue current treatment for acute CHF exacerbation.  #9 prophylaxis PPI for GI prophylaxis. Lovenox for DVT prophylaxis.  Code Status: DO NOT RESUSCITATE Family Communication: updated patient, no family at bedside. Disposition Plan: Remain on telemetry. Hopefully home in 1-2 days. Patient will like to be home before her [birth year] on Saturday.   Consultants:  Pulmonary: Dr. Halford Chessman  08/01/2015  Procedures:  Chest x-ray 08/01/2015  2-D echo 08/03/2015  Antibiotics:  Augmentin 08/01/2015  HPI/Subjective: Patient states SOB improved. No CP.  Objective: Filed Vitals:   08/03/15 1333  BP: 154/57  Pulse: 92  Temp: 97.9 F (36.6 C)  Resp: 18    Intake/Output Summary (Last 24 hours) at 08/03/15 1635 Last data filed at 08/03/15 1334  Gross per 24 hour  Intake   1056 ml  Output   3050 ml  Net  -1994 ml   Filed Weights   08/01/15 1036 08/02/15 0509 08/03/15 0838  Weight: 70.2 kg (154 lb 12.2 oz) 71.4 kg (157 lb 6.5 oz) 70.398 kg (155 lb 3.2 oz)    Exam:   General:  NAD  Cardiovascular: RRR  Respiratory: Some inspiratory crackles.   Abdomen: Soft, nontender, nondistended, positive bowel sounds.  Musculoskeletal: No clubbing cyanosis or edema.  Data Reviewed: Basic Metabolic Panel:  Recent Labs Lab 08/01/15 0745 08/02/15 0431 08/03/15 0600  NA 131* 134* 133*  K 4.3 4.4 4.5  CL 99* 100* 97*  CO2 25 26 29   GLUCOSE 106* 154* 131*  BUN 10 12 16   CREATININE 0.64 0.57 0.66  CALCIUM 9.3 9.4 9.1  MG 2.0  --  2.1   Liver Function Tests:  Recent Labs Lab 08/01/15 0745  AST 47*  ALT 45  ALKPHOS 149*  BILITOT 1.1  PROT 6.9  ALBUMIN 3.3*   No results for input(s): LIPASE, AMYLASE in the last 168 hours. No results for input(s): AMMONIA in the last 168 hours. CBC:  Recent Labs Lab 08/01/15 0745 08/02/15 0431 08/03/15 0600  WBC 15.5* 16.6* 22.5*  NEUTROABS 12.9*  --   --   HGB 13.4 12.9 12.5  HCT 40.9 38.9 38.2  MCV 90.3 90.5 90.3  PLT 302 321 341  Cardiac Enzymes:  Recent Labs Lab 08/02/15 0815 08/02/15 1322 08/02/15 2010 08/03/15 0915 08/03/15 1507  TROPONINI <0.03 0.03 0.15* 0.17* 0.16*   BNP (last 3 results)  Recent Labs  08/01/15 0745 08/03/15 0600  BNP 250.1* 278.6*    ProBNP (last 3 results)  Recent Labs  08/15/14 1457 08/16/14 0336 08/17/14 0515  PROBNP 1859.0* 1717.0* 802.7*     CBG:  Recent Labs Lab 08/02/15 0731 08/03/15 0745  GLUCAP 143* 112*    Recent Results (from the past 240 hour(s))  Urine culture     Status: None   Collection Time: 08/01/15  8:14 AM  Result Value Ref Range Status   Specimen Description URINE, CLEAN CATCH  Final   Special Requests NONE  Final   Culture   Final    MULTIPLE SPECIES PRESENT, SUGGEST RECOLLECTION Performed at [facility]    Report Status 08/02/2015 FINAL  Final     Studies: No results found.  Scheduled Meds: . amoxicillin-clavulanate  1 tablet Oral Q12H  . aspirin EC  325 mg Oral Daily  . benzonatate  200 mg Oral TID  . cholecalciferol  1,000 Units Oral Daily  . enoxaparin (LOVENOX) injection  40 mg Subcutaneous Q24H  . furosemide  40 mg Intravenous Q12H  . ipratropium-albuterol  3 mL Nebulization TID  . irbesartan  300 mg Oral Daily  . pantoprazole  80 mg Oral Q1200  . predniSONE  40 mg Oral Q breakfast  . sertraline  100 mg Oral Daily  . sodium chloride  1 spray Each Nare BID  . sodium chloride  3 mL Intravenous Q12H  . sodium chloride  3 mL Intravenous Q12H   Continuous Infusions: . sodium chloride 20 mL/hr at 08/01/15 1202    Principal Problem:   Pulmonary fibrosis Active Problems:   Essential hypertension   GERD   Diastolic dysfunction with acute on chronic heart failure   Coronary artery disease   Leukocytosis   Hyperlipidemia   Obesity   Chronic diastolic CHF (congestive heart failure)   SOB (shortness of breath)   Chronic hyponatremia    Time spent: 35 minutes    [facility] MD [facility] Pager [phone]. If 7PM-7AM, please contact night-coverage at [email or web address], password [facility] 08/03/2015, 4:35 PM  LOS: 2 days

## 2015-08-04 DIAGNOSIS — I2510 Atherosclerotic heart disease of native coronary artery without angina pectoris: Secondary | ICD-10-CM

## 2015-08-04 DIAGNOSIS — J9601 Acute respiratory failure with hypoxia: Secondary | ICD-10-CM

## 2015-08-04 LAB — GLUCOSE, CAPILLARY: GLUCOSE-CAPILLARY: 78 mg/dL (ref 65–99)

## 2015-08-04 LAB — TROPONIN I: Troponin I: 0.12 ng/mL — ABNORMAL HIGH (ref <0.031)

## 2015-08-04 NOTE — Progress Notes (Addendum)
Patient ID: [name], female   DOB: 1927/12/14, 81 y.o.   MRN: [id number] [facility] PROGRESS NOTE  MYRACLE FEBRES TTS:[id number] DOB: July 08, 1928 DOA: 08/01/2015 PCP: Marton Redwood, MD  Brief narrative:    81 year old female with past medical history of pulmonary fibrosis, follows with Dr. Melvyn Novas of [facility], chronic diastolic CHF, anemia, hypertension, depression who presented to [facility] with worsening shortness of breath, cough productive of clear sputum and wheezing for past 1 week prior to this admission. She has been seen by Dr. Melvyn Novas prior to this admission for shortness of breath and her prednisone dose was increased at that time to 10 mg daily.  On admission, BNP was at 250. First troponin was negative. WBC count. Chest x-ray was negative for acute cardiopulmonary disease. Patient was given a dose of IV Decadron and placed on oxygen. Pulmonary has seen the pt in consultation. She was diuresed with IV lasix.   Anticipated discharge: likely 9/29.  Assessment/Plan:    Principal Problem: Pulmonary fibrosis / acute respiratory failure with hypoxia - Patient presented with shortness of breath and cough on the admission. Her chest x-ray was negative for acute cardiopulmonary findings. - Hypoxia likely secondary to patient's history of pulmonary fibrosis. - She has been on IV Solu-Medrol which is now transitioned to prednisone. She is on 40 mg daily prednisone. - She has been seen by pulmonary in consultation. - We will also continue Augmentin, nebulizer treatments scheduled and if needed for shortness of breath and wheezing. - She may require oxygen on discharge.  Active Problems: Acute on chronic diastolic heart failure - 2 D ECHO on this admission with preserved EF, grade 1 diastolic dysfunction - She is on IV lasix 40 mg IV Q 12 hours - Weight trend in past 72 hours: 714. Kg --> 70.39 kg --> 69.6 kg  - Continue strict intake and output. Continue daily weight. -  Electrolytes stable.   Gastroesophageal reflux disease  - Continue protonix.   Essential hypertension  - Continue Avapro and Lasix.  Coronary artery disease - Stable.  - No chest pain.  Leukocytosis - Likely exacerbated by steroids - She is on Augmentin  - CXR did not show acute infiltrates   Hyponatremia - Likely due to CHF etiology - Stable at  134, 133  Elevated troponins - Likely secondary to demand ischemia from hypoxia and CHF exacerbation - 2 D ECHO on this admission stable, normal EF with grade 1 diastolic dysfunction  - Troponin level stable    DVT prophylaxis - Lovenox subQ ordered    Code Status: DNR/DNI Family Communication:  plan of care discussed with the patient and her brother at the bedside  Disposition Plan: Home likely by 9/29  IV access:  Peripheral IV  Procedures and diagnostic studies:    Dg Chest 2 View 08/01/2015   Chronic scarring/fibrosis in the right upper lobe and bilateral lower lobes.  No evidence of acute cardiopulmonary disease.     2 D ECHO 08/02/2015 - EF 09%, grade 1 diastolic dysfunction   Medical Consultants:  Pulmonary   Other Consultants:  Physical therapy  IAnti-Infectives:   Augmentin 08/01/2015 -->    Leisa Lenz, MD  [facility] Pager [phone]  Time spent in minutes: 25 minutes  If 7PM-7AM, please contact night-coverage [email or web address] Password [email or web address] 08/04/2015, 12:34 PM   LOS: 3 days    HPI/Subjective: No acute overnight events. Patient reports feeling little better today.   Objective: Filed Vitals:  08/03/15 2001 08/03/15 2053 08/04/15 0459 08/04/15 0915  BP:  122/48 151/55   Pulse:  97 76   Temp:  98.4 F (36.9 C) 98.2 F (36.8 C)   TempSrc:  Oral Oral   Resp:  18 20   Height:      Weight:   69.627 kg (153 lb 8 oz)   SpO2: 98% 93% 94% 86%    Intake/Output Summary (Last 24 hours) at 08/04/15 1234 Last data filed at 08/04/15 0700  Gross per 24 hour  Intake    600 ml  Output   1600  ml  Net  -1000 ml    Exam:   General:  Pt is alert, follows commands appropriately, not in acute distress  Cardiovascular: Regular rate and rhythm, S1/S2 (+)  Respiratory: Clear to auscultation bilaterally, no wheezing, no crackles, no rhonchi  Abdomen: Soft, non tender, non distended, bowel sounds present  Extremities: No edema, pulses DP and PT palpable bilaterally  Neuro: Grossly nonfocal  Data Reviewed: Basic Metabolic Panel:  Recent Labs Lab 08/01/15 0745 08/02/15 0431 08/03/15 0600  NA 131* 134* 133*  K 4.3 4.4 4.5  CL 99* 100* 97*  CO2 25 26 29   GLUCOSE 106* 154* 131*  BUN 10 12 16   CREATININE 0.64 0.57 0.66  CALCIUM 9.3 9.4 9.1  MG 2.0  --  2.1   Liver Function Tests:  Recent Labs Lab 08/01/15 0745  AST 47*  ALT 45  ALKPHOS 149*  BILITOT 1.1  PROT 6.9  ALBUMIN 3.3*   No results for input(s): LIPASE, AMYLASE in the last 168 hours. No results for input(s): AMMONIA in the last 168 hours. CBC:  Recent Labs Lab 08/01/15 0745 08/02/15 0431 08/03/15 0600  WBC 15.5* 16.6* 22.5*  NEUTROABS 12.9*  --   --   HGB 13.4 12.9 12.5  HCT 40.9 38.9 38.2  MCV 90.3 90.5 90.3  PLT 302 321 341   Cardiac Enzymes:  Recent Labs Lab 08/02/15 1322 08/02/15 2010 08/03/15 0915 08/03/15 1507 08/03/15 2113  TROPONINI 0.03 0.15* 0.17* 0.16* 0.12*   BNP: Invalid input(s): POCBNP CBG:  Recent Labs Lab 08/02/15 0731 08/03/15 0745 08/04/15 0738  GLUCAP 143* 112* 78    Recent Results (from the past 240 hour(s))  Urine culture     Status: None   Collection Time: 08/01/15  8:14 AM  Result Value Ref Range Status   Specimen Description URINE, CLEAN CATCH  Final   Special Requests NONE  Final   Culture   Final    MULTIPLE SPECIES PRESENT, SUGGEST RECOLLECTION Performed at [facility]    Report Status 08/02/2015 FINAL  Final     Scheduled Meds: . amoxicillin-clavulanate  1 tablet Oral Q12H  . aspirin EC  325 mg Oral Daily  . benzonatate   200 mg Oral TID  . cholecalciferol  1,000 Units Oral Daily  . enoxaparin (LOVENOX) injection  40 mg Subcutaneous Q24H  . furosemide  40 mg Intravenous Q12H  . ipratropium-albuterol  3 mL Nebulization TID  . irbesartan  300 mg Oral Daily  . pantoprazole  80 mg Oral Q1200  . predniSONE  40 mg Oral Q breakfast  . sertraline  100 mg Oral Daily   Continuous Infusions: . sodium chloride 20 mL/hr at 08/01/15 1202

## 2015-08-04 NOTE — Progress Notes (Signed)
Physical Therapy Treatment Patient Details Name: [name] MRN: [id number] DOB: Apr 02, 1928 Today's Date: 08/04/2015    History of Present Illness 81 y.o. female with history of pulmonary fibrosis followed by Dr. Melvyn Novas of [facility], who presents to the ED with a one-week history of worsening shortness of breath, generalized fatigue, productive cough of clear sputum, wheezing.     PT Comments    Pt feeling better and able to tolerate improvement in ambulation distance however does require supplemental oxygen at this time.  Pt very motivated to participate as she wishes to be home by her [birth year] this [birth year].  SATURATION QUALIFICATIONS: (This note is used to comply with regulatory documentation for home oxygen)  Patient Saturations on Room Air at Rest = 95%  Patient Saturations on Room Air while Ambulating = 86%  Patient Saturations on 2 Liters of oxygen while Ambulating = 91%  Please briefly explain why patient needs home oxygen: to maintain oxygen saturations above 88% during physical activity such as ambulation.   Follow Up Recommendations  Home health PT     Equipment Recommendations  None recommended by PT    Recommendations for Other Services       Precautions / Restrictions Precautions Precautions: Fall Precaution Comments: monitor saturations Restrictions Weight Bearing Restrictions: No    Mobility  Bed Mobility               General bed mobility comments: pt up in recliner on arrival  Transfers Overall transfer level: Needs assistance Equipment used: None Transfers: Sit to/from Stand Sit to Stand: Supervision         General transfer comment: supervision for safety  Ambulation/Gait Ambulation/Gait assistance: Min guard Ambulation Distance (Feet): 200 Feet Assistive device: Rolling walker (2 wheeled) Gait Pattern/deviations: Step-through pattern;Decreased stride length     General Gait Details: slow but steady gait with RW, SPO2  dropped to 86% on room air so applied oxygen, verbal cues for taking rest breaks and pursed lip breathing   Stairs            Wheelchair Mobility    Modified Rankin (Stroke Patients Only)       Balance                                    Cognition Arousal/Alertness: Awake/alert Behavior During Therapy: WFL for tasks assessed/performed Overall Cognitive Status: Within Functional Limits for tasks assessed                      Exercises      General Comments        Pertinent Vitals/Pain Pain Assessment: No/denies pain    Home Living                      Prior Function            PT Goals (current goals can now be found in the care plan section) Progress towards PT goals: Progressing toward goals    Frequency  Min 3X/week    PT Plan Current plan remains appropriate    Co-evaluation             End of Session Equipment Utilized During Treatment: Oxygen;Gait belt Activity Tolerance: Patient tolerated treatment well Patient left: with call bell/phone within reach;in chair     Time: 6295-2841 PT Time Calculation (min) (ACUTE ONLY): 11 min  Charges:  $Gait  Training: 8-22 mins                    G Codes:      LEMYRE,KATHrine E 2015-09-01, 11:17 AM Carmelia Bake, PT, DPT 09/01/15 Pager: [phone]

## 2015-08-04 NOTE — Progress Notes (Signed)
Occupational Therapy Treatment Patient Details Name: [name] MRN: [id number] DOB: July 15, 1928 Today's Date: 08/04/2015    History of present illness 81 y.o. female with history of pulmonary fibrosis followed by Dr. Melvyn Novas of [facility], who presents to the ED with a one-week history of worsening shortness of breath, generalized fatigue, productive cough of clear sputum, wheezing.    OT comments  Pt doing well. O2 on 1L drops to 86% with bathing task and exertion. Increases to 92% with rest and emphasis on purse lip breathing. Pt making good progress. HR 107 with activity.    Follow Up Recommendations  No OT follow up;Supervision - Intermittent    Equipment Recommendations  None recommended by OT    Recommendations for Other Services      Precautions / Restrictions Precautions Precaution Comments: O2 Restrictions Weight Bearing Restrictions: No       Mobility Bed Mobility               General bed mobility comments: on BSC when OT arrived.   Transfers Overall transfer level: Needs assistance Equipment used: None Transfers: Sit to/from Stand Sit to Stand: Supervision              Balance                                   ADL       Grooming: Oral care;Supervision/safety;Standing   Upper Body Bathing: Set up;Sitting   Lower Body Bathing: Supervison/ safety;Sit to/from stand   Upper Body Dressing : Set up;Sitting       Toilet Transfer: Supervision/safety;Stand-pivot;BSC   Toileting- Clothing Manipulation and Hygiene: Supervision/safety;Sit to/from stand         General ADL Comments: Pt with energy conservation handout in room. Asked pt to states a couple strategies she recalls but pt stating, "I suspect it is the things I already have been doing at home." Reviewed several strategies with pt to make sure she understands importance of taking rest breaks and breaking down tasks. Her O2 does drop on 1L to 86% with bathing and  sit to stand repetitions. Increased to 92% with rest. emphasized sitting down when possible but pt reports she likes to stand to do much of her ADl. She has an O2 monitor at home.       Vision                     Perception     Praxis      Cognition   Behavior During Therapy: WFL for tasks assessed/performed Overall Cognitive Status: Within Functional Limits for tasks assessed                       Extremity/Trunk Assessment               Exercises     Shoulder Instructions       General Comments      Pertinent Vitals/ Pain       Pain Assessment: No/denies pain  Home Living                                          Prior Functioning/Environment              Frequency Min 2X/week  Progress Toward Goals  OT Goals(current goals can now be found in the care plan section)  Progress towards OT goals: Progressing toward goals     Plan Discharge plan remains appropriate    Co-evaluation                 End of Session Equipment Utilized During Treatment: Oxygen   Activity Tolerance Patient tolerated treatment well   Patient Left in chair;with call bell/phone within reach   Nurse Communication          Time: 1610-9604 OT Time Calculation (min): 27 min  Charges: OT General Charges $OT Visit: 1 Procedure OT Treatments $Self Care/Home Management : 8-22 mins $Therapeutic Activity: 8-22 mins  Jules Schick  [phone] 08/04/2015, 9:28 AM

## 2015-08-04 NOTE — Care Management Note (Signed)
Case Management Note  Patient Details  Name: [name] MRN: [id number] Date of Birth: [birth year]  Subjective/Objective: [facility] rep Kristen aware of [place] orders, [facility] dme rep Lecretia aware of home 02 order.                   Action/Plan:d/c home w/HHC/DME.   Expected Discharge Date:                 Expected Discharge Plan:  [place]  In-House Referral:     Discharge planning Services  CM Consult  Post Acute Care Choice:    Choice offered to:     DME Arranged:  Oxygen DME Agency:  [place] Arranged:  RN, PT, OT, Nurse's Aide [facility] Agency:  [place]  Status of Service:  In process, will continue to follow  Medicare Important Message Given:  Yes-second notification given Date Medicare IM Given:    Medicare IM give by:    Date Additional Medicare IM Given:    Additional Medicare Important Message give by:     If discussed at [place] of Stay Meetings, dates discussed:    Additional Comments:  Dessa Phi, RN 08/04/2015, 12:31 PM

## 2015-08-05 DIAGNOSIS — R7989 Other specified abnormal findings of blood chemistry: Secondary | ICD-10-CM

## 2015-08-05 LAB — GLUCOSE, CAPILLARY: GLUCOSE-CAPILLARY: 94 mg/dL (ref 65–99)

## 2015-08-05 MED ORDER — PREDNISONE 20 MG PO TABS: 40.0000 mg | ORAL_TABLET | Freq: Every day | ORAL | Status: DC

## 2015-08-05 MED ORDER — BENZONATATE 200 MG PO CAPS: 200.0000 mg | ORAL_CAPSULE | Freq: Three times a day (TID) | ORAL | Status: DC | PRN

## 2015-08-05 MED ORDER — AMOXICILLIN-POT CLAVULANATE 875-125 MG PO TABS: 1.0000 | ORAL_TABLET | Freq: Two times a day (BID) | ORAL | Status: DC

## 2015-08-05 MED ORDER — IPRATROPIUM-ALBUTEROL 0.5-2.5 (3) MG/3ML IN SOLN: 3.0000 mL | Freq: Four times a day (QID) | RESPIRATORY_TRACT | Status: DC | PRN

## 2015-08-05 NOTE — Discharge Summary (Signed)
Physician Discharge Summary  RAZAN SILER ELF:[id number] DOB: 1928-03-16 DOA: 08/01/2015  PCP: Marton Redwood, MD  Admit date: 08/01/2015 Discharge date: 08/05/2015  Recommendations for Outpatient Follow-up:  Patient will continue taking prednisone 40 mg daily until she is seen by Dr. Melvyn Novas. This was recommended by pulmonary team during this hospital stay.  Respiratory status is stable. She can continue Lasix as per prior home regimen.  Patient will continue Augmentin for 3 more days on discharge.    Principal Problem:   Pulmonary fibrosis Active Problems:   Essential hypertension   GERD   Diastolic dysfunction with acute on chronic heart failure   Coronary artery disease   Leukocytosis   Hyperlipidemia   Obesity   Chronic diastolic CHF (congestive heart failure)   SOB (shortness of breath)   Chronic hyponatremia    Discharge Condition: stable   Diet recommendation: as tolerated   History of present illness:  81 year old female with past medical history of pulmonary fibrosis, follows with Dr. Melvyn Novas of [facility], chronic diastolic CHF, anemia, hypertension, depression who presented to [facility] with worsening shortness of breath, cough productive of clear sputum and wheezing for past 1 week prior to this admission. She has been seen by Dr. Melvyn Novas prior to this admission for shortness of breath and her prednisone dose was increased at that time to 10 mg daily.  On admission, BNP was at 250. First troponin was negative. WBC count. Chest x-ray was negative for acute cardiopulmonary disease. Patient was given a dose of IV Decadron and placed on oxygen. Pulmonary has seen the pt in consultation. She was diuresed with IV lasix. Per pulmonary, patient will continue 40 mg daily of prednisone until she is seen by Dr. Melvyn Novas.   Hospital Course:   Assessment/Plan:    Principal Problem: Pulmonary fibrosis / acute respiratory failure with hypoxia - Patient presented with  shortness of breath and cough. Chest x-ray on admission did not show acute cardiopulmonary findings. Hypoxia thought to be secondary to history of pulmonary fibrosis.- Patient was on IV Solu-Medrol during this hospital stay which was then transitioned to oral prednisone. - Per pulmonary she will continue 40 mg daily prednisone until she is seen in pulmonary clinic. - She will continue Augmentin for 3 more days on discharge. - She will continue DuoNeb every 6 hours as needed for shortness of breath or wheezing. - Continue oxygen on discharge.  Active Problems: Acute on chronic diastolic heart failure - 2 D ECHO on this admission with preserved EF, grade 1 diastolic dysfunction - She has received IV Lasix 40 mg IV every 12 hours. - Her weight has gradually trended down. - Weight trend in past 72 hours: 714. Kg --> 70.39 kg --> 69.6 kg --> 68.6 - She may continue Lasix per home regimen.   Gastroesophageal reflux disease  - Continue protonix  on discharge   Essential hypertension  - Continue Avapro and Lasix.  Coronary artery disease - Stable.  - no reports of chest pain.   Leukocytosis - secondary to steroids. She is on Augmentin which she will continue for 3 more days on discharge.  - No acute findings on chest x-ray.   Hyponatremia - Secondary to CHF etiology - Sodium level stable  Elevated troponins - Likely secondary to demand ischemia from hypoxia and CHF exacerbation - 2 D ECHO done during this hospital stay - normal ejection fraction, grade 1 diastolic dysfunction - Troponin level stabilized    DVT prophylaxis - Lovenox  subQ ordered in hospital    Code Status: DNR/DNI Family Communication: plan of care discussed with the patient and her brother at the bedside    IV access:  Peripheral IV  Procedures and diagnostic studies:   Dg Chest 2 View 08/01/2015 Chronic scarring/fibrosis in the right upper lobe and bilateral lower lobes. No evidence of acute  cardiopulmonary disease.   2 D ECHO 08/02/2015 - EF 84%, grade 1 diastolic dysfunction   Medical Consultants:  Pulmonary   Other Consultants:  Physical therapy  IAnti-Infectives:   Augmentin 08/01/2015 --> for 3 more days on discharge   Signed:  Leisa Lenz, MD  [facility] 08/05/2015, 9:56 AM  Pager #: [phone]  Time spent in minutes: more than 30 minutes    Discharge Exam: Filed Vitals:   08/05/15 0452  BP: 149/62  Pulse: 73  Temp: 97.5 F (36.4 C)  Resp: 18   Filed Vitals:   08/04/15 2041 08/04/15 2145 08/05/15 0452 08/05/15 0738  BP:  143/61 149/62   Pulse:  109 73   Temp:  98.2 F (36.8 C) 97.5 F (36.4 C)   TempSrc:  Oral Oral   Resp:  20 18   Height:      Weight:   68.9 kg (151 lb 14.4 oz)   SpO2: 95% 96% 97% 95%    General: Pt is alert, follows commands appropriately, not in acute distress Cardiovascular: Regular rate and rhythm, S1/S2 + Respiratory: no wheezing, no crackles, no rhonchi Abdominal: Soft, non tender, non distended, bowel sounds +, no guarding Extremities: no edema, no cyanosis, pulses palpable bilaterally DP and PT Neuro: Grossly nonfocal  Discharge Instructions  Discharge Instructions    Call MD for:  difficulty breathing, headache or visual disturbances    Complete by:  As directed      Call MD for:  persistant nausea and vomiting    Complete by:  As directed      Call MD for:  severe uncontrolled pain    Complete by:  As directed      Diet - low sodium heart healthy    Complete by:  As directed      Discharge instructions    Complete by:  As directed   1. Please continue prednisone 40 mg daily until you're seen by Dr. Melvyn Novas.     Increase activity slowly    Complete by:  As directed             Medication List    STOP taking these medications        atorvastatin 20 MG tablet  Commonly known as:  LIPITOR      TAKE these medications        amoxicillin-clavulanate 875-125 MG tablet   Commonly known as:  AUGMENTIN  Take 1 tablet by mouth every 12 (twelve) hours.     aspirin 325 MG EC tablet  Take 1 tablet (325 mg total) by mouth daily.     benzonatate 200 MG capsule  Commonly known as:  TESSALON  Take 1 capsule (200 mg total) by mouth 3 (three) times daily as needed for cough.     cholecalciferol 1000 UNITS tablet  Commonly known as:  VITAMIN D  Take 1,000 Units by mouth daily.     esomeprazole 20 MG capsule  Commonly known as:  NEXIUM  Take 40 mg by mouth daily before breakfast.     fluticasone 50 MCG/ACT nasal spray  Commonly known as:  FLONASE  Place 2 sprays  into both nostrils once as needed for allergies or rhinitis.     furosemide 20 MG tablet  Commonly known as:  LASIX  Take 20 mg by mouth every Monday, Wednesday, and Friday.     guaiFENesin 600 MG 12 hr tablet  Commonly known as:  MUCINEX  Take 1 tablet (600 mg total) by mouth 2 (two) times daily.     ipratropium-albuterol 0.5-2.5 (3) MG/3ML Soln  Commonly known as:  DUONEB  Take 3 mLs by nebulization every 6 (six) hours as needed.     predniSONE 20 MG tablet  Commonly known as:  DELTASONE  Take 2 tablets (40 mg total) by mouth daily with breakfast.     sertraline 100 MG tablet  Commonly known as:  ZOLOFT  Take 100 mg by mouth daily.     sodium chloride 0.65 % Soln nasal spray  Commonly known as:  OCEAN  Place 1 spray into both nostrils 2 (two) times daily.     traMADol 50 MG tablet  Commonly known as:  ULTRAM  Take 50 mg by mouth every 6 (six) hours as needed for moderate pain.     TYLENOL 8 HOUR 650 MG CR tablet  Generic drug:  acetaminophen  Take 650 mg by mouth every 8 (eight) hours as needed for pain.     valsartan 320 MG tablet  Commonly known as:  DIOVAN  Take 1 tablet by mouth daily.            Follow-up Information    Follow up with Christinia Gully, MD On 08/12/2015.   Specialty:  Pulmonary Disease   Why:  Appt at 11:45    Contact information:   [address] [address] [phone]       Follow up with [facility].   Why:  02   Contact information:   [address] [place] [address] [phone]       Follow up with [facility].   Why:  HHRN/PT/OT/aide   Contact information:   [address] [place] [address] [phone]       Follow up with Marton Redwood, MD. Schedule an appointment as soon as possible for a visit in 1 week.   Specialty:  Internal Medicine   Why:  Follow up appt after recent hospitalization   Contact information:   [place] [address] [phone]        The results of significant diagnostics from this hospitalization (including imaging, microbiology, ancillary and laboratory) are listed below for reference.    Significant Diagnostic Studies: Dg Chest 2 View  08/01/2015   CLINICAL DATA:  Productive cough, shortness of breath, pulmonary fibrosis  EXAM: CHEST  2 VIEW  COMPARISON:  07/28/2015  FINDINGS: Right upper lobe and bilateral lower lobe scarring/fibrosis. No superimposed opacities suspicious for pneumonia.  The heart is normal in size.  Mild degenerative changes of the visualized thoracolumbar spine.  IMPRESSION: Chronic scarring/fibrosis in the right upper lobe and bilateral lower lobes.  No evidence of acute cardiopulmonary disease.   Electronically Signed   By: Julian Hy M.D.   On: 08/01/2015 08:21   Dg Chest 2 View  07/28/2015   CLINICAL DATA:  Shortness of breath cough congestion history of pulmonary fibrosis  EXAM: CHEST  2 VIEW  COMPARISON:  11/09/2014  FINDINGS: Heart size and vascular pattern are normal. Moderate diffuse interstitial change stable from prior examination. No new opacities.  IMPRESSION: Chronic pulmonary fibrosis.  No acute findings   Electronically Signed   By: Skipper Cliche M.D.   On: 07/28/2015 15:54    Microbiology: Recent Results (from the past 240 hour(s))  Urine culture      Status: None   Collection Time: 08/01/15  8:14 AM  Result Value Ref Range Status   Specimen Description URINE, CLEAN CATCH  Final   Special Requests NONE  Final   Culture   Final    MULTIPLE SPECIES PRESENT, SUGGEST RECOLLECTION Performed at [facility]    Report Status 08/02/2015 FINAL  Final     Labs: Basic Metabolic Panel:  Recent Labs Lab 08/01/15 0745 08/02/15 0431 08/03/15 0600  NA 131* 134* 133*  K 4.3 4.4 4.5  CL 99* 100* 97*  CO2 25 26 29   GLUCOSE 106* 154* 131*  BUN 10 12 16   CREATININE 0.64 0.57 0.66  CALCIUM 9.3 9.4 9.1  MG 2.0  --  2.1   Liver Function Tests:  Recent Labs Lab 08/01/15 0745  AST 47*  ALT 45  ALKPHOS 149*  BILITOT 1.1  PROT 6.9  ALBUMIN 3.3*   No results for input(s): LIPASE, AMYLASE in the last 168 hours. No results for input(s): AMMONIA in the last 168 hours. CBC:  Recent Labs Lab 08/01/15 0745 08/02/15 0431 08/03/15 0600  WBC 15.5* 16.6* 22.5*  NEUTROABS 12.9*  --   --   HGB 13.4 12.9 12.5  HCT 40.9 38.9 38.2  MCV 90.3 90.5 90.3  PLT 302 321 341   Cardiac Enzymes:  Recent Labs Lab 08/02/15 1322 08/02/15 2010 08/03/15 0915 08/03/15 1507 08/03/15 2113  TROPONINI 0.03 0.15* 0.17* 0.16* 0.12*   BNP: BNP (last 3 results)  Recent Labs  08/01/15 0745 08/03/15 0600  BNP 250.1* 278.6*    ProBNP (last 3 results)  Recent Labs  08/15/14 1457 08/16/14 0336 08/17/14 0515  PROBNP 1859.0* 1717.0* 802.7*    CBG:  Recent Labs Lab 08/02/15 0731 08/03/15 0745 08/04/15 0738 08/05/15 0737  GLUCAP 143* 112* 78 94

## 2015-08-05 NOTE — Discharge Instructions (Addendum)
Idiopathic Pulmonary Fibrosis Idiopathic pulmonary fibrosis is an inflammation (soreness and irritation) in the lungs which eventually causes scarring. This usually shows in middle age over a several year time period. The cause is unknown (idiopathic). Usually death occurs after several years but there are no time tables which will predict perfectly what the course of the illness will be. It affects males and females equally. In this condition there is a formation of fibrous (tough leathery) tissue in the small ducts which carry air to and from your lungs. Because of this, the lungs do not work as well as they should for taking in oxygen from the air you breathe and getting rid of wastes (carbon dioxide). Because the lungs are damaged, there may be more problems with infections or the heart.  Other problems may include pulmonary hypertension (high blood pressure in the lungs), and formation of blood clots. Some symptoms of this illness are:  Coughing and breathing difficulties; cough is usually dry and hacking.  Bluish (cyanotic) skin and lips due to lack of circulating oxygen.  Loss of appetite.  Loss of strength which comes from just the increased work of breathing.  Rapid, shallow breathing occur with moderate exercise and later even while resting.  Increasing shortness of breath (dyspnea) which progresses as the disease gets worse.  Weight loss and fatigue due partly to the increased work of breathing.  Clubbing of the fingers (the ends of the fingers become rounded and enlarged). DIAGNOSIS  A diagnosis of idiopathic pulmonary fibrosis may be suspected based on exam and a patient's history.  Specialized X-rays, pulmonary function tests, pulse oximetry, and laboratory tests including blood gasses help confirm the problem.  Sometimes a biopsy is done and a small piece of lung tissue is removed. This may be done through a bronchoscope or during an operation in which the chest is opened. This  is looked at under a microscope by a specialist who can tell what the lung problem is. CAUSES If the cause of pulmonary fibrosis is known, it is no longer known as idiopathic. Several causes of pulmonary fibrosis include:  Occupational and environmental exposures to asbestos, silica, and or metal dusts.  Illegal or street drug use.  Agricultural workers may inhale substances, such as moldy hay, which can cause an allergic reaction in the lung. This reaction is called Farmer's Lung and can cause pulmonary fibrosis. Some other fumes found on farms are directly toxic to the lungs.  Exposure of the lungs to radiation.  Collagen diseases.  Sarcoidosis is a disease which forms granulomas (areas of inflammatory cells), which can attack any area of the body but most frequently affects the lungs.  Drugs. Certain medicines may have the undesirable side effect of causing pulmonary fibrosis. Check with your doctor about the medicines you are taking and ask about any possible side effects.  Some cases of pulmonary fibrosis seem to be genetic. TREATMENT   There are no drugs currently approved for the treatment of pulmonary fibrosis. Steroids (a potent medication which cuts down on inflammation) are sometimes given to prevent lung changes before they become permanent. High doses may be recommended at first, followed by lower maintenance dosages. Other medications may be tried if steroids do not work.  Lung disease may be monitored with X-rays and laboratory work.  Oxygen may be helpful if oxygen in the blood is diminished. This improves the quality of life. Your caregiver will give you a prescription for this if it is helpful.  Antibiotics are used  for treatment of infections.  Exercise may be beneficial.  Lung transplants are being investigated and a single lung transplant may be considered for some patients.  Influenza vaccine and pneumococcal pneumonia vaccine are both recommended for people  with IPF or any lung disease. These two shots may help keep you healthy. Document Released: 01/13/2004 Document Revised: 01/15/2012 Document Reviewed: 10/23/2005 [facility] Patient Information 2015 [place], [place]. This information is not intended to replace advice given to you by your health care provider. Make sure you discuss any questions you have with your health care provider. Amoxicillin; Clavulanic Acid extended-release tablets What is this medicine? AMOXICILLIN; CLAVULANIC ACID (a mox i SILL in; KLAV yoo lan ic AS id) is a penicillin antibiotic. It is used to treat certain kinds of bacterial infections. It will not work for colds, flu, or other viral infections. This medicine may be used for other purposes; ask your health care provider or pharmacist if you have questions. COMMON BRAND NAME(S): Augmentin XR What should I tell my health care provider before I take this medicine? They need to know if you have any of these conditions: -bowel disease, like colitis -kidney disease -liver disease -mononucleosis -an unusual or allergic reaction to amoxicillin, penicillin, cephalosporin, other antibiotics, clavulanic acid, other medicines, foods, dyes, or preservatives -pregnant or trying to get pregnant -breast-feeding How should I use this medicine? Take this medicine by mouth with a full glass of water. Follow the directions on the prescription label. Take at the start of a meal. Do not crush or chew. You may cut this medicine in half at the score line for easier swallowing. Take your medicine at regular intervals. Do not take your medicine more often than directed. Take all of your medicine as directed even if you think you are better. Do not skip doses or stop your medicine early. Contact your pediatrician or health care professional regarding the use of this medicine in children. This medicine has been used in children as young as 43 years of age. Overdosage: If you think you have taken too  much of this medicine contact a poison control center or emergency room at once. NOTE: This medicine is only for you. Do not share this medicine with others. What if I miss a dose? If you miss a dose, take it as soon as you can. If it is almost time for your next dose, take only that dose. Do not take double or extra doses. What may interact with this medicine? -allopurinol -anticoagulants -birth control pills -methotrexate -probenecid This list may not describe all possible interactions. Give your health care provider a list of all the medicines, herbs, non-prescription drugs, or dietary supplements you use. Also tell them if you smoke, drink alcohol, or use illegal drugs. Some items may interact with your medicine. What should I watch for while using this medicine? Tell your doctor or health care professional if your symptoms do not improve. Do not treat diarrhea with over the counter products. Contact your doctor if you have diarrhea that lasts more than 2 days or if it is severe and watery. If you have diabetes, you may get a false-positive result for sugar in your urine. Check with your doctor or health care professional. Birth control pills may not work properly while you are taking this medicine. Talk to your doctor about using an extra method of birth control. What side effects may I notice from receiving this medicine? Side effects that you should report to your doctor or health care  professional as soon as possible: -allergic reactions like skin rash, itching or hives, swelling of the face, lips, or tongue -breathing problems -dark urine -fever or chills, sore throat -redness, blistering, peeling or loosening of the skin, including inside the mouth -seizures -trouble passing urine or change in the amount of urine -unusual bleeding, bruising -unusually weak or tired -white patches or sores in the mouth or throat Side effects that usually do not require medical attention (report  to your doctor or health care professional if they continue or are bothersome): -diarrhea -dizziness -headache -nausea, vomiting -stomach upset -vaginal or anal irritation This list may not describe all possible side effects. Call your doctor for medical advice about side effects. You may report side effects to FDA at 1-800-FDA-1088. Where should I keep my medicine? Keep out of the reach of children. Store at room temperature below 25 degrees C (77 degrees F). Keep container tightly closed. Throw away any unused medicine after the expiration date. NOTE: This sheet is a summary. It may not cover all possible information. If you have questions about this medicine, talk to your doctor, pharmacist, or health care provider.  2015, [employer]. (2008-01-14 14:32:45)

## 2015-08-06 DIAGNOSIS — Z853 Personal history of malignant neoplasm of breast: Secondary | ICD-10-CM | POA: Diagnosis not present

## 2015-08-06 DIAGNOSIS — F329 Major depressive disorder, single episode, unspecified: Secondary | ICD-10-CM | POA: Diagnosis not present

## 2015-08-06 DIAGNOSIS — Z9981 Dependence on supplemental oxygen: Secondary | ICD-10-CM | POA: Diagnosis not present

## 2015-08-06 DIAGNOSIS — K219 Gastro-esophageal reflux disease without esophagitis: Secondary | ICD-10-CM | POA: Diagnosis not present

## 2015-08-06 DIAGNOSIS — J8410 Pulmonary fibrosis, unspecified: Secondary | ICD-10-CM | POA: Diagnosis not present

## 2015-08-06 DIAGNOSIS — Z7982 Long term (current) use of aspirin: Secondary | ICD-10-CM | POA: Diagnosis not present

## 2015-08-06 DIAGNOSIS — Z7952 Long term (current) use of systemic steroids: Secondary | ICD-10-CM | POA: Diagnosis not present

## 2015-08-06 DIAGNOSIS — Z9011 Acquired absence of right breast and nipple: Secondary | ICD-10-CM | POA: Diagnosis not present

## 2015-08-06 DIAGNOSIS — I2510 Atherosclerotic heart disease of native coronary artery without angina pectoris: Secondary | ICD-10-CM | POA: Diagnosis not present

## 2015-08-06 DIAGNOSIS — I10 Essential (primary) hypertension: Secondary | ICD-10-CM | POA: Diagnosis not present

## 2015-08-06 DIAGNOSIS — E785 Hyperlipidemia, unspecified: Secondary | ICD-10-CM | POA: Diagnosis not present

## 2015-08-06 DIAGNOSIS — E669 Obesity, unspecified: Secondary | ICD-10-CM | POA: Diagnosis not present

## 2015-08-06 DIAGNOSIS — I5033 Acute on chronic diastolic (congestive) heart failure: Secondary | ICD-10-CM | POA: Diagnosis not present

## 2015-08-10 DIAGNOSIS — I5033 Acute on chronic diastolic (congestive) heart failure: Secondary | ICD-10-CM | POA: Diagnosis not present

## 2015-08-10 DIAGNOSIS — F329 Major depressive disorder, single episode, unspecified: Secondary | ICD-10-CM | POA: Diagnosis not present

## 2015-08-10 DIAGNOSIS — J8410 Pulmonary fibrosis, unspecified: Secondary | ICD-10-CM | POA: Diagnosis not present

## 2015-08-10 DIAGNOSIS — K219 Gastro-esophageal reflux disease without esophagitis: Secondary | ICD-10-CM | POA: Diagnosis not present

## 2015-08-10 DIAGNOSIS — I10 Essential (primary) hypertension: Secondary | ICD-10-CM | POA: Diagnosis not present

## 2015-08-10 DIAGNOSIS — I2510 Atherosclerotic heart disease of native coronary artery without angina pectoris: Secondary | ICD-10-CM | POA: Diagnosis not present

## 2015-08-11 DIAGNOSIS — K219 Gastro-esophageal reflux disease without esophagitis: Secondary | ICD-10-CM | POA: Diagnosis not present

## 2015-08-11 DIAGNOSIS — F329 Major depressive disorder, single episode, unspecified: Secondary | ICD-10-CM | POA: Diagnosis not present

## 2015-08-11 DIAGNOSIS — J8410 Pulmonary fibrosis, unspecified: Secondary | ICD-10-CM | POA: Diagnosis not present

## 2015-08-11 DIAGNOSIS — I2510 Atherosclerotic heart disease of native coronary artery without angina pectoris: Secondary | ICD-10-CM | POA: Diagnosis not present

## 2015-08-11 DIAGNOSIS — I5033 Acute on chronic diastolic (congestive) heart failure: Secondary | ICD-10-CM | POA: Diagnosis not present

## 2015-08-11 DIAGNOSIS — I10 Essential (primary) hypertension: Secondary | ICD-10-CM | POA: Diagnosis not present

## 2015-08-12 ENCOUNTER — Ambulatory Visit (INDEPENDENT_AMBULATORY_CARE_PROVIDER_SITE_OTHER): Payer: Medicare Other | Admitting: Internal Medicine

## 2015-08-12 ENCOUNTER — Encounter: Payer: Self-pay | Admitting: Internal Medicine

## 2015-08-12 VITALS — BP 152/58 | HR 80

## 2015-08-12 DIAGNOSIS — I2510 Atherosclerotic heart disease of native coronary artery without angina pectoris: Secondary | ICD-10-CM

## 2015-08-12 DIAGNOSIS — R0902 Hypoxemia: Secondary | ICD-10-CM

## 2015-08-12 DIAGNOSIS — J8410 Pulmonary fibrosis, unspecified: Secondary | ICD-10-CM

## 2015-08-12 NOTE — Patient Instructions (Addendum)
Prednisone 30 mg daily x one week, 20 mg daily x one week, then 15 mg daily x 2 weeks then 10 mg is your new floor dose but ok to double if start to worsen.  02 instructions:  No need for 02 sitting or walking across the room but definitely wear 2lpm when walking outside your home and when get where you are going and sit down you can take it back off to save oxygen - also wear it 2lpm at bedtime automatically   [facility] is responsible for maintaining your equipment and explaining how it works but if there is any issue with them call [facility] at [phone] with the name of the person that's not helping you and we'll call them directly  Please schedule a follow up office visit in 6 weeks, call sooner if needed

## 2015-08-12 NOTE — Progress Notes (Signed)
86 yowf never smoker referred by Dr Shaw for eval of new onset sob early 2012 with clear evidence of pulmonary fibrosis ? Related to connective tissue dz 08/2014    History of Present Illness  November 28, 2010 1st pulmonary office eval for new onset doe x across the room, abupt onset right after NY's, assoc with increase cough getting better and chest tightness assoc also with nasal congestion.. sleeping better most of the nights without noct exac or early am excess mucus production.  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 Connective tissue dz related  Chief Complaint  Patient presents with  . HFU    Pt states that her breathing has improved, but not back to baseline. No new co's today.   improved to point where could do HT on day of ov on RA Prednisone 2 with breakfast rec Nexium should be 40 mg daily automatically  Take 30-60 min before first meal of the day and pepcid ac 20 mg at  Bedtime until you return  Reduce prednisone to 20 mg daily with bfast  GERD diet   09/28/2014 f/u ov/ re: PF/ assoc with arthritis/Pos RA/on prednisone new rx 08/24/14   Chief Complaint  Patient presents with  . Follow-up    Pt states that her breathing continues to improve, but not yet back to normal baseline.    can do HT leaning on basket / uses [facility] parking / level into house and very sob if  carries groceries in vs better   a year ago=  clean house , walk around block, no HC parking  About 50% back to baseline since  on prednisone  Previously reports short courses of pred helped arthritic symptoms a lot but never on it more than a few weeks and is concerned about staying on it. rec Reduce prednisone to 10 mg each am    10/20/2014 f/u ov/ re: pred 10 mg daily for PF related to ? RA  Chief Complaint  Patient presents with  . Acute Visit    Pt c/o weakness, dizziness and SOB for the past 6 days. She is also c/o prod cough with thick, clear sputum.    Not really sure breathing is worse so much as her fatigue is and this correlates best with resolution of chronic leg swelling while on lasix, her doe and arthritis may are really not changed on 20 vs 10 of prednisone.  rec No change prednisone pending labs but hold the lasix until swelling gets noticeable and take it  As needed .   11/09/2014 f/u ov/ re: pf/ ? Assoc connective tissue dz  On 10 mg pred daily  Chief Complaint  Patient presents with  . Follow-up    Pt states that she is unchanged. She feels like she is having some increased nasal and chest congestion, has to clear her throat alot.     rec Try prednisone 10   mg one half  daily at breakfast Continue nexium 40 mg Take 30-60 min before first meal of the day and Pepcid 20 mg at supper  Please remember to go to the x-ray department downstairs for your tests - we will call you with the results when they are available. Please schedule a follow up office visit in 6 weeks, call sooner if needed - if worse increase prednisone to 10 mg daily       12/30/2014 f/u ov/ re: sob Chief Complaint  Patient presents with  . Follow-up    Pt states that her congestion seems to be better. Her breathing is overall doing well. No new co's today.    Decreased lasix by half, increased prednisone to 10 mg daily after back pain / hands pain increased on the lower dose but not  sob/cough at lower dose rec Ok to maintain predisone at 10 mg per day since this dose helps arthritis and therefore probably more than adequate to cover lung inflammation I am deferring whether to refer you to rheumatology to Dr Shaw's capable hands   03/31/2015 f/u ov/ re: connective tissue dz/ PF   Chief Complaint  Patient presents with  . Follow-up    Pt states her breathing is unchanged since the last visit. No new co's today.    can do any shopping she wants as long as has a cart and hc parking and no cough or joint aches at 10 mg daily - really  Not limited by breathing from desired activities   rec No change rx = 10 mg pred daily    07/01/2015 f/u ov/ re: aches/ ild that appear pred responsive Chief Complaint  Patient presents with  . Follow-up    Pt states her breathing is about the same "maybe some worse".     On 10 mg daily thru 8/21 no aches and no change in doe so reduced dose on her own to 5 mg qod. Min dry daytime coughing                  rec Add pepcid ac 20 mg at bedtime and For drainage / throat tickle try take CHLORPHENIRAMINE  4 mg - take one every 4 hours as needed - available over the counter- may cause drowsiness so start with just a bedtime dose or two and see how you tolerate it before trying in daytime   Please remember to go to the   x-ray department downstairs for your tests - we will call you with the results when they are available. The fact you are so prednisone dependent strongly suggests you have a rheumatologic condition and may benefit from consultation with a  Rheumatologist No blood work today but I would add at least an ESR to the studies you will get tomorrow with Dr Shaw to evaluate your fatigue  Leave prednisone at 10 mg per day for now with option to increase to 20 mg if condition worsens  Please see patient coordinator before you leave today  to schedule :  Wear 02 2lpm only when walking     08/12/2015  Post hosp f/u ov/ re:  steroid resp ild/ ? Underlying connective tissue dz/ resp failure  Chief Complaint  Patient presents with  . Hospitalization Follow-up    Post-inflammatory PF. Pt states she has been doing well on home O2/2L, wearing it 24h/day. Pt c/o of mostly dry cough, sometimes wet in the mornings. Pt also c/o of wheeze/SOB with exertion. Pt does   not use albuterol inhaler.   Presently on 40 mg of prednisone daily  Concentrator is set at 2lpm bedtime Portable system is full but unable to turn on Walked in off 02 / no sob x room to room.   No obvious day to day or daytime variability or assoc   cp or chest tightness,  or overt sinus or hb symptoms. No unusual exp hx or h/o childhood pna/ asthma or knowledge of premature birth.  Sleeping ok without nocturnal  or early am exacerbation  of respiratory  c/o's or need for noct saba. Also denies any obvious fluctuation of symptoms with weather or environmental changes or other aggravating or alleviating factors except as outlined above   Current Medications, Allergies, Complete Past Medical History, Past Surgical History, Family History, and Social History were reviewed in [facility] electronic medical record.  ROS  The following are not active complaints unless bolded sore throat, dysphagia, dental problems, itching, sneezing,  nasal congestion or excess/ purulent secretions, ear ache,   fever, chills, sweats, unintended wt loss, classically pleuritic or exertional cp, hemoptysis,  orthopnea pnd or leg swelling, presyncope, palpitations, abdominal pain, anorexia, nausea, vomiting, diarrhea  or change in bowel or bladder habits, change in stools or urine, dysuria,hematuria,  rash, arthralgias, visual complaints, headache, numbness, weakness or ataxia or problems with walking or coordination,  change in mood/affect or memory.        Pex  amb wf nad  09/28/2014    160  Vs 10/20/2014  160 > 11/09/2014 162 > 12/30/2014  160  > 03/31/2015 161 > 07/01/2015  162  Wt  Readings from Last 3 Encounters:  08/31/14 158 lb (71.668 kg)  08/15/14 159 lb 2.8 oz (72.2 kg)  08/07/14 162 lb (73.483 kg)     HEENT: nl dentition, turbinates, and orophanx. Nl external ear canals without cough reflex   NECK :  without JVD/Nodes/TM/ nl carotid upstrokes bilaterally   LUNGS: no acc muscle use,  Bilateral faint  basilar  insp crackles s cough on insp   CV:  RRR  no s3 or murmur or increase in P2, no edema   ABD:  soft and nontender with nl excursion in the supine position. No bruits or organomegaly, bowel sounds nl  MS:  warm without deformities, calf tenderness, cyanosis or clubbing  SKIN: warm and dry without lesions    NEURO:  alert, approp, no deficits      Lab Results  Component Value Date   ESRSEDRATE 21 10/20/2014   ESRSEDRATE 67* 08/17/2014            

## 2015-08-13 ENCOUNTER — Ambulatory Visit: Payer: Medicare Other | Admitting: Internal Medicine

## 2015-08-13 DIAGNOSIS — I10 Essential (primary) hypertension: Secondary | ICD-10-CM | POA: Diagnosis not present

## 2015-08-13 DIAGNOSIS — K219 Gastro-esophageal reflux disease without esophagitis: Secondary | ICD-10-CM | POA: Diagnosis not present

## 2015-08-13 DIAGNOSIS — J8410 Pulmonary fibrosis, unspecified: Secondary | ICD-10-CM | POA: Diagnosis not present

## 2015-08-13 DIAGNOSIS — F329 Major depressive disorder, single episode, unspecified: Secondary | ICD-10-CM | POA: Diagnosis not present

## 2015-08-13 DIAGNOSIS — I2510 Atherosclerotic heart disease of native coronary artery without angina pectoris: Secondary | ICD-10-CM | POA: Diagnosis not present

## 2015-08-13 DIAGNOSIS — I5033 Acute on chronic diastolic (congestive) heart failure: Secondary | ICD-10-CM | POA: Diagnosis not present

## 2015-08-15 NOTE — Assessment & Plan Note (Signed)
07/28/2015   Walked RA x one lap @ 185 stopped due to  desat to 86% nl pace 07/28/2015   Walked   2lpm   2 laps @ 185 ft each stopped due to fatigue, no sob or desat > rec 02 with walking only  - 08/12/2015   Walked RA x one lap @ 185 stopped due to  desat to 88%  then 2lpm > walked 2 laps nl pace s sob or desat  rec as of 08/12/2015  2lpm bedtime and walking more than across the room at home  I had an extended discussion with the patient reviewing all relevant studies completed to date and  lasting 25 minutes of a 40 minute visit    It took me a full 10 minutes to sort out what the problem was as far as her oxygen requirements at home in terms of the equipment she is using. She needs to call [facility] directly with any issues related to the equipment (turns out for example that she actually has portable tanks that are appropriate for her to use but does not  know how to turn them on so walked in today without any ambulatory 02)  and then contact me if they can't solve it with the name of the person can't solve it rather than going back and forth multiple times trying to troubleshoot it the way we did today    Each maintenance medication was reviewed in detail including most importantly the difference between maintenance and prns and under what circumstances the prns are to be triggered using an action plan format that is not reflected in the computer generated alphabetically organized AVS.    Please see instructions for details which were reviewed in writing and the patient given a copy highlighting the part that I personally wrote and discussed at today's ov.

## 2015-08-15 NOTE — Assessment & Plan Note (Signed)
PFT's 03/10/10 FEV1 1.68 (130%) ratio 74 , DLCO 48% (vs 45% 04/22/07) corrects to 60%  See inpt adm 08/15/14  -CT 08/15/14 >> moderate CAD, UIP pattern of pulmonary fibrosis with b/l areas of GGO, BTX RUL  -ESR 08/16/14 >> 67 > started systemic steroids as inpt -Echo 08/16/14 >> mild LVH, EF 60 to 15%, diastolic dysfx  -Labs 40/08/67 >> ANA negative, RF 32, anti-CCP negative, SS-A/SS-B negative, SCL-70 negative  -PFT 08/17/14 >> FEV1 1.75 (115%), FEV1% 84, TLC 3.39 (73%), DLCO 45% - 08/31/14   Walked RA x 3 laps @ 185 ft each stopped due to  92% slow pace @ 40 mg per day> rec reduce to 20 mg daily  - 09/28/2014  Walked RA x 3 laps @ 185 ft each stopped due to  Slow to mod pace, some sob at end but sats 94%  - reduced pred to 10 mg daily 09/28/2014 > worse starting 10/14/14 so recheck esr 10/20/2014  - 10/20/2014  Walked RA x 3 laps @ 185 ft each stopped due to end of study, slow to mod pace, no sob or desats on 10 mg daily pred - 11/09/2014   Trial of prednisone 5 mg daily > flared in terms of arthritis > resumed 10 mg per day since around 12/07/14  - 03/31/2015  Walked RA x 3 laps @ 185 ft each stopped due to  End of study, some sob but no desat at nl pace at 10 mg pred per day  - 07/01/2015  Walked RA x 3 laps @ 185 ft each stopped due to min sob/ nl pace no desat    At a dose of 10 mg daily until 06/27/15 then changed herself to 5 mg qod> rec slow taper back to same floor - 07/28/2015   Walked RA  2 laps @ 185 ft each stopped due to  Fatigue/ nl pace, no desat @ 10 mg daily with ESR 35   There is very clearly a steroid responsive/ dep component here that flared on 5 mg per day. The goal with a chronic steroid dependent illness is always arriving at the lowest effective dose that controls the disease/symptoms and not accepting a set "formula" which is based on statistics or guidelines that don't always take into account patient  variability or the natural hx of the dz in every individual patient, which may  well vary over time.  For now therefore I recommend the patient maintain  A ceiling of 20 mg and a new (higher) floor of 10 mg daily unless directed to do otherwise by Rheum.

## 2015-08-17 DIAGNOSIS — I5033 Acute on chronic diastolic (congestive) heart failure: Secondary | ICD-10-CM | POA: Diagnosis not present

## 2015-08-17 DIAGNOSIS — J8410 Pulmonary fibrosis, unspecified: Secondary | ICD-10-CM | POA: Diagnosis not present

## 2015-08-17 DIAGNOSIS — I10 Essential (primary) hypertension: Secondary | ICD-10-CM | POA: Diagnosis not present

## 2015-08-17 DIAGNOSIS — F329 Major depressive disorder, single episode, unspecified: Secondary | ICD-10-CM | POA: Diagnosis not present

## 2015-08-17 DIAGNOSIS — K219 Gastro-esophageal reflux disease without esophagitis: Secondary | ICD-10-CM | POA: Diagnosis not present

## 2015-08-17 DIAGNOSIS — I2510 Atherosclerotic heart disease of native coronary artery without angina pectoris: Secondary | ICD-10-CM | POA: Diagnosis not present

## 2015-08-18 DIAGNOSIS — K219 Gastro-esophageal reflux disease without esophagitis: Secondary | ICD-10-CM | POA: Diagnosis not present

## 2015-08-18 DIAGNOSIS — J8410 Pulmonary fibrosis, unspecified: Secondary | ICD-10-CM | POA: Diagnosis not present

## 2015-08-18 DIAGNOSIS — I10 Essential (primary) hypertension: Secondary | ICD-10-CM | POA: Diagnosis not present

## 2015-08-18 DIAGNOSIS — I2510 Atherosclerotic heart disease of native coronary artery without angina pectoris: Secondary | ICD-10-CM | POA: Diagnosis not present

## 2015-08-18 DIAGNOSIS — F329 Major depressive disorder, single episode, unspecified: Secondary | ICD-10-CM | POA: Diagnosis not present

## 2015-08-18 DIAGNOSIS — I5033 Acute on chronic diastolic (congestive) heart failure: Secondary | ICD-10-CM | POA: Diagnosis not present

## 2015-08-19 DIAGNOSIS — I10 Essential (primary) hypertension: Secondary | ICD-10-CM | POA: Diagnosis not present

## 2015-08-19 DIAGNOSIS — I5033 Acute on chronic diastolic (congestive) heart failure: Secondary | ICD-10-CM | POA: Diagnosis not present

## 2015-08-19 DIAGNOSIS — J8410 Pulmonary fibrosis, unspecified: Secondary | ICD-10-CM | POA: Diagnosis not present

## 2015-08-19 DIAGNOSIS — I2510 Atherosclerotic heart disease of native coronary artery without angina pectoris: Secondary | ICD-10-CM | POA: Diagnosis not present

## 2015-08-19 DIAGNOSIS — F329 Major depressive disorder, single episode, unspecified: Secondary | ICD-10-CM | POA: Diagnosis not present

## 2015-08-19 DIAGNOSIS — K219 Gastro-esophageal reflux disease without esophagitis: Secondary | ICD-10-CM | POA: Diagnosis not present

## 2015-08-20 ENCOUNTER — Ambulatory Visit (INDEPENDENT_AMBULATORY_CARE_PROVIDER_SITE_OTHER): Payer: Medicare Other | Admitting: Internal Medicine

## 2015-08-20 ENCOUNTER — Encounter: Payer: Self-pay | Admitting: Internal Medicine

## 2015-08-20 VITALS — BP 136/60 | HR 103 | Temp 97.9°F | Resp 18 | Ht 61.0 in | Wt 154.0 lb

## 2015-08-20 DIAGNOSIS — F32A Depression, unspecified: Secondary | ICD-10-CM

## 2015-08-20 DIAGNOSIS — I5032 Chronic diastolic (congestive) heart failure: Secondary | ICD-10-CM | POA: Diagnosis not present

## 2015-08-20 DIAGNOSIS — K219 Gastro-esophageal reflux disease without esophagitis: Secondary | ICD-10-CM | POA: Diagnosis not present

## 2015-08-20 DIAGNOSIS — I10 Essential (primary) hypertension: Secondary | ICD-10-CM | POA: Diagnosis not present

## 2015-08-20 DIAGNOSIS — J8410 Pulmonary fibrosis, unspecified: Secondary | ICD-10-CM | POA: Diagnosis not present

## 2015-08-20 DIAGNOSIS — F329 Major depressive disorder, single episode, unspecified: Secondary | ICD-10-CM | POA: Diagnosis not present

## 2015-08-20 DIAGNOSIS — I2510 Atherosclerotic heart disease of native coronary artery without angina pectoris: Secondary | ICD-10-CM | POA: Diagnosis not present

## 2015-08-20 DIAGNOSIS — I5033 Acute on chronic diastolic (congestive) heart failure: Secondary | ICD-10-CM | POA: Diagnosis not present

## 2015-08-20 NOTE — Patient Instructions (Addendum)
  We have reviewed your prior records including labs and tests today.   All other Health Maintenance issues reviewed.   All recommended immunizations and age-appropriate screenings are up-to-date.  No immunizations administered today.   Medications reviewed and updated.   No changes recommended at this time.   Please schedule followup in 6 months

## 2015-08-20 NOTE — Progress Notes (Signed)
Subjective:    Patient ID: [name], female    DOB: [birth year], 81 y.o.   MRN: [id number]  HPI She is here to establish.  She has a history of hypertension, diastolic heart failure, pulmonary fibrosis, GERD, history of an esophageal stricture s/p dilation, h/o breast cancer, osa, hyperlipidemia and CAD.  She follows with cardiology, pulmonary, ophthalmology   Chronic diastolic dysfunction, CHF, htn: She is taking the lasix twice a day not three times a day.  She takes her other meds as prescribed.  She follows with cardiology.  She has occasional palpitations.   She denies chest pain and leg edema.   She feels her sob is at her baseline.    Pulmonary fibrosis:  She follows with pulmonary.  She is on prednisone 20mg  daily and goes to 30mg  if needed.  She did not feel well yesterday and ended up taking 30 mg just yesterday and felt better.  They are trying to slowly taper her down on the steroids, but she does not feel well on lower doses.  She has daily sob and fatigue, but denies fever, cough and wheeze.  She uses oxygen on night and sometimes during the day with activity.  [facility] and has home PT.  She felt tired today after PT and had to go on her oxygen.  Today was the first day she need the oxygen during the day.   Depression, anxiety:  She is taking zoloft daily and is happy with her current dose.  She feels her depression and anxiety are well controlled.  Medications and allergies reviewed with patient and updated if appropriate.  Patient Active Problem List   Diagnosis Date Noted  . Chronic hyponatremia 08/03/2015  . Pulmonary fibrosis ([place]) 08/01/2015  . Chronic diastolic CHF (congestive heart failure) ([place]) 08/01/2015  . SOB (shortness of breath) 08/01/2015  . Exercise hypoxemia 07/28/2015  . Obesity 07/01/2015  . Hyperlipidemia 09/17/2014  . Diastolic dysfunction with acute on chronic heart failure ([place]) 08/17/2014  . Coronary artery disease  08/17/2014  . Leukocytosis 08/17/2014  . Protein-calorie malnutrition ([place]) 08/17/2014  . Postinflammatory pulmonary fibrosis ([place]) 08/16/2014  . Abdominal pain, unspecified site 08/03/2014  . Change in bowel habits 07/24/2012  . Special screening for malignant neoplasms, colon 12/08/2011  . Stricture and stenosis of esophagus 12/08/2011  . NEOPLASM, MALIGNANT, RIGHT BREAST 11/28/2010  . OBSTRUCTIVE SLEEP APNEA 11/28/2010  . Essential hypertension 11/28/2010  . ALLERGIC RHINITIS 11/28/2010  . GERD 11/28/2010    Past Medical History  Diagnosis Date  . Dyspnea   . Diverticulosis 2008  . Internal hemorrhoids   . Esophageal stricture   . Arthritis   . Breast cancer ([place]) 1969  . GERD (gastroesophageal reflux disease)   . Gallstones   . Depression   . HLD (hyperlipidemia)   . HTN (hypertension)   . Obesity   . Colon polyp 2013    TUBULAR ADENOMA (X1  . Pulmonary fibrosis ([place])   . CAD (coronary artery disease)   . Chronic diastolic CHF (congestive heart failure) ([place]) 08/01/2015    Past Surgical History  Procedure Laterality Date  . Abdominal hysterectomy    . Cholecystectomy    . Mastectomy      right  . Appendectomy    . Foot surgery    . Breast surgery      Social History   Social History  . Marital Status: Married    Spouse Name: N/A  . Number  of Children: 0  . Years of Education: N/A   Occupational History  . retired    Social History Main Topics  . Smoking status: Never Smoker   . Smokeless tobacco: Never Used  . Alcohol Use: 4.2 oz/week    7 Glasses of wine per week     Comment: ocaasional  . Drug Use: No  . Sexual Activity: Not Asked   Other Topics Concern  . None   Social History Narrative    Review of Systems  Constitutional: Positive for fatigue. Negative for fever and appetite change.  Respiratory: Positive for shortness of breath (dyspnea with moderate exertion, can do ADLS without oxygen). Negative for cough and wheezing.     Cardiovascular: Positive for palpitations (from prednisone). Negative for chest pain and leg swelling.  Gastrointestinal: Positive for constipation (from meds). Negative for abdominal pain.       GERD controlled  Musculoskeletal: Positive for back pain (chronic).  Neurological: Positive for dizziness. Negative for headaches.       Objective:   Filed Vitals:   08/20/15 1406  BP: 136/60  Pulse: 103  Temp: 97.9 F (36.6 C)  Resp: 18   Filed Weights   08/20/15 1406  Weight: 154 lb (69.854 kg)   Body mass index is 29.11 kg/(m^2).   Physical Exam  Constitutional: She is oriented to person, place, and time. She appears well-developed and well-nourished. No distress.  HENT:  Head: Normocephalic and atraumatic.  Right Ear: External ear normal.  Left Ear: External ear normal.  Mouth/Throat: Oropharynx is clear and moist.  Eyes: Conjunctivae are normal.  Neck: Normal range of motion. Neck supple. No tracheal deviation present. No thyromegaly present.  No carotid bruit  Cardiovascular: Normal rate, regular rhythm and normal heart sounds.   No murmur heard. Pulmonary/Chest: Effort normal and breath sounds normal. No respiratory distress. She has no wheezes.  Abdominal: Soft. She exhibits no distension. There is no tenderness.  Musculoskeletal: She exhibits no edema.  Lymphadenopathy:    She has no cervical adenopathy.  Neurological: She is alert and oriented to person, place, and time.  Psychiatric: She has a normal mood and affect. Her behavior is normal.        Assessment & Plan:   See Problem List.  Follow up in 6 months

## 2015-08-20 NOTE — Progress Notes (Signed)
Pre visit review using our clinic review tool, if applicable. No additional management support is needed unless otherwise documented below in the visit note. 

## 2015-08-22 DIAGNOSIS — F329 Major depressive disorder, single episode, unspecified: Secondary | ICD-10-CM | POA: Insufficient documentation

## 2015-08-22 DIAGNOSIS — F32A Depression, unspecified: Secondary | ICD-10-CM | POA: Insufficient documentation

## 2015-08-22 NOTE — Assessment & Plan Note (Signed)
Following with Pulmonary Steroid dosing per pulm Uses oxygen at night, rarely during the day, she does desaturate with activity

## 2015-08-22 NOTE — Assessment & Plan Note (Signed)
BP controlled. Continue current meds 

## 2015-08-22 NOTE — Assessment & Plan Note (Signed)
With some anxiety Controlled Continue current dose of zoloft

## 2015-08-22 NOTE — Assessment & Plan Note (Addendum)
Controlled on nexium No change

## 2015-08-22 NOTE — Assessment & Plan Note (Signed)
Appears euvolemic today and sob at baseline Follows with cardiology BP controlled Continue current meds

## 2015-08-24 DIAGNOSIS — I2510 Atherosclerotic heart disease of native coronary artery without angina pectoris: Secondary | ICD-10-CM | POA: Diagnosis not present

## 2015-08-24 DIAGNOSIS — J8410 Pulmonary fibrosis, unspecified: Secondary | ICD-10-CM | POA: Diagnosis not present

## 2015-08-24 DIAGNOSIS — F329 Major depressive disorder, single episode, unspecified: Secondary | ICD-10-CM | POA: Diagnosis not present

## 2015-08-24 DIAGNOSIS — I10 Essential (primary) hypertension: Secondary | ICD-10-CM | POA: Diagnosis not present

## 2015-08-24 DIAGNOSIS — K219 Gastro-esophageal reflux disease without esophagitis: Secondary | ICD-10-CM | POA: Diagnosis not present

## 2015-08-24 DIAGNOSIS — I5033 Acute on chronic diastolic (congestive) heart failure: Secondary | ICD-10-CM | POA: Diagnosis not present

## 2015-08-25 DIAGNOSIS — I5033 Acute on chronic diastolic (congestive) heart failure: Secondary | ICD-10-CM | POA: Diagnosis not present

## 2015-08-25 DIAGNOSIS — K219 Gastro-esophageal reflux disease without esophagitis: Secondary | ICD-10-CM | POA: Diagnosis not present

## 2015-08-25 DIAGNOSIS — I10 Essential (primary) hypertension: Secondary | ICD-10-CM | POA: Diagnosis not present

## 2015-08-25 DIAGNOSIS — F329 Major depressive disorder, single episode, unspecified: Secondary | ICD-10-CM | POA: Diagnosis not present

## 2015-08-25 DIAGNOSIS — I2510 Atherosclerotic heart disease of native coronary artery without angina pectoris: Secondary | ICD-10-CM | POA: Diagnosis not present

## 2015-08-25 DIAGNOSIS — J8410 Pulmonary fibrosis, unspecified: Secondary | ICD-10-CM | POA: Diagnosis not present

## 2015-08-27 ENCOUNTER — Telehealth: Payer: Self-pay | Admitting: Emergency Medicine

## 2015-08-27 DIAGNOSIS — K219 Gastro-esophageal reflux disease without esophagitis: Secondary | ICD-10-CM | POA: Diagnosis not present

## 2015-08-27 DIAGNOSIS — I10 Essential (primary) hypertension: Secondary | ICD-10-CM | POA: Diagnosis not present

## 2015-08-27 DIAGNOSIS — F329 Major depressive disorder, single episode, unspecified: Secondary | ICD-10-CM | POA: Diagnosis not present

## 2015-08-27 DIAGNOSIS — I5033 Acute on chronic diastolic (congestive) heart failure: Secondary | ICD-10-CM | POA: Diagnosis not present

## 2015-08-27 DIAGNOSIS — I2510 Atherosclerotic heart disease of native coronary artery without angina pectoris: Secondary | ICD-10-CM | POA: Diagnosis not present

## 2015-08-27 DIAGNOSIS — J8410 Pulmonary fibrosis, unspecified: Secondary | ICD-10-CM | POA: Diagnosis not present

## 2015-08-27 NOTE — Telephone Encounter (Signed)
If her BP is consistently low on the bottom that could cause some fatigue.  We may need to consider decreasing the dose of the valsartan , but I would want to make sure her blood pressure is consistently low prior to doing that.  Have her monitor her BP and if the bottom number remains low < 60 we can try decreasing the valsartan to 160 mg daily, but we will need to keep a close eye on her BP because we can not have it go too high

## 2015-08-27 NOTE — Telephone Encounter (Signed)
Pt stated that she is having trouble with the prednisone. She is trying to lower the dose but is feeling weak, tried reducing to 15mg . Had to increase back to 20mg . Could the Valsartan be causing the BP to be 130/50? Typically her BP reads 135/60. Pt would like to know suggestions on what to do? Does she needs an office visit?

## 2015-08-30 NOTE — Telephone Encounter (Signed)
Pt has been informed. Pt plans to get a new BP cuff to start monitoring her BP more often. Pt stated that she was told that she was on two medications that were the same and would like to know what two medications this could be. Please advise.

## 2015-08-30 NOTE — Telephone Encounter (Signed)
I reviewed her med list - the only thing I see as far as the same medication is prednisone listed twice.

## 2015-08-30 NOTE — Telephone Encounter (Signed)
LVM for pt to return call

## 2015-08-30 NOTE — Telephone Encounter (Signed)
Patient called back

## 2015-08-31 ENCOUNTER — Inpatient Hospital Stay (HOSPITAL_COMMUNITY)
Admission: EM | Admit: 2015-08-31 | Discharge: 2015-09-04 | DRG: 196 | Disposition: A | Discharge status: Home Health | Payer: Medicare Other | Attending: Internal Medicine | Admitting: Internal Medicine

## 2015-08-31 ENCOUNTER — Observation Stay (HOSPITAL_COMMUNITY): Payer: Medicare Other

## 2015-08-31 ENCOUNTER — Encounter (HOSPITAL_COMMUNITY): Payer: Self-pay | Admitting: Emergency Medicine

## 2015-08-31 ENCOUNTER — Emergency Department (HOSPITAL_COMMUNITY): Payer: Medicare Other

## 2015-08-31 DIAGNOSIS — Z79899 Other long term (current) drug therapy: Secondary | ICD-10-CM

## 2015-08-31 DIAGNOSIS — Z9981 Dependence on supplemental oxygen: Secondary | ICD-10-CM

## 2015-08-31 DIAGNOSIS — I2510 Atherosclerotic heart disease of native coronary artery without angina pectoris: Secondary | ICD-10-CM | POA: Diagnosis present

## 2015-08-31 DIAGNOSIS — J440 Chronic obstructive pulmonary disease with acute lower respiratory infection: Secondary | ICD-10-CM

## 2015-08-31 DIAGNOSIS — Z884 Allergy status to anesthetic agent status: Secondary | ICD-10-CM

## 2015-08-31 DIAGNOSIS — I5032 Chronic diastolic (congestive) heart failure: Secondary | ICD-10-CM | POA: Diagnosis present

## 2015-08-31 DIAGNOSIS — E871 Hypo-osmolality and hyponatremia: Secondary | ICD-10-CM | POA: Diagnosis present

## 2015-08-31 DIAGNOSIS — R0609 Other forms of dyspnea: Secondary | ICD-10-CM | POA: Diagnosis not present

## 2015-08-31 DIAGNOSIS — J9601 Acute respiratory failure with hypoxia: Secondary | ICD-10-CM | POA: Diagnosis present

## 2015-08-31 DIAGNOSIS — J9621 Acute and chronic respiratory failure with hypoxia: Secondary | ICD-10-CM | POA: Diagnosis present

## 2015-08-31 DIAGNOSIS — J8410 Pulmonary fibrosis, unspecified: Secondary | ICD-10-CM | POA: Diagnosis not present

## 2015-08-31 DIAGNOSIS — Z7952 Long term (current) use of systemic steroids: Secondary | ICD-10-CM

## 2015-08-31 DIAGNOSIS — J189 Pneumonia, unspecified organism: Secondary | ICD-10-CM

## 2015-08-31 DIAGNOSIS — I5042 Chronic combined systolic (congestive) and diastolic (congestive) heart failure: Secondary | ICD-10-CM | POA: Diagnosis not present

## 2015-08-31 DIAGNOSIS — Z800 Family history of malignant neoplasm of digestive organs: Secondary | ICD-10-CM

## 2015-08-31 DIAGNOSIS — J9611 Chronic respiratory failure with hypoxia: Secondary | ICD-10-CM

## 2015-08-31 DIAGNOSIS — K219 Gastro-esophageal reflux disease without esophagitis: Secondary | ICD-10-CM | POA: Diagnosis present

## 2015-08-31 DIAGNOSIS — I509 Heart failure, unspecified: Secondary | ICD-10-CM | POA: Diagnosis not present

## 2015-08-31 DIAGNOSIS — T380X5A Adverse effect of glucocorticoids and synthetic analogues, initial encounter: Secondary | ICD-10-CM | POA: Diagnosis present

## 2015-08-31 DIAGNOSIS — I110 Hypertensive heart disease with heart failure: Secondary | ICD-10-CM | POA: Diagnosis present

## 2015-08-31 DIAGNOSIS — Z66 Do not resuscitate: Secondary | ICD-10-CM | POA: Diagnosis present

## 2015-08-31 DIAGNOSIS — J209 Acute bronchitis, unspecified: Secondary | ICD-10-CM | POA: Diagnosis present

## 2015-08-31 DIAGNOSIS — Z515 Encounter for palliative care: Secondary | ICD-10-CM | POA: Diagnosis present

## 2015-08-31 DIAGNOSIS — Z881 Allergy status to other antibiotic agents status: Secondary | ICD-10-CM

## 2015-08-31 DIAGNOSIS — R0602 Shortness of breath: Secondary | ICD-10-CM | POA: Diagnosis not present

## 2015-08-31 DIAGNOSIS — Z853 Personal history of malignant neoplasm of breast: Secondary | ICD-10-CM

## 2015-08-31 DIAGNOSIS — R52 Pain, unspecified: Secondary | ICD-10-CM

## 2015-08-31 DIAGNOSIS — Z79891 Long term (current) use of opiate analgesic: Secondary | ICD-10-CM

## 2015-08-31 DIAGNOSIS — Z86010 Personal history of colonic polyps: Secondary | ICD-10-CM

## 2015-08-31 DIAGNOSIS — Z885 Allergy status to narcotic agent status: Secondary | ICD-10-CM

## 2015-08-31 DIAGNOSIS — Y95 Nosocomial condition: Secondary | ICD-10-CM | POA: Diagnosis present

## 2015-08-31 DIAGNOSIS — Z8249 Family history of ischemic heart disease and other diseases of the circulatory system: Secondary | ICD-10-CM

## 2015-08-31 DIAGNOSIS — J470 Bronchiectasis with acute lower respiratory infection: Secondary | ICD-10-CM | POA: Diagnosis present

## 2015-08-31 DIAGNOSIS — M1990 Unspecified osteoarthritis, unspecified site: Secondary | ICD-10-CM | POA: Diagnosis present

## 2015-08-31 DIAGNOSIS — R262 Difficulty in walking, not elsewhere classified: Secondary | ICD-10-CM | POA: Diagnosis present

## 2015-08-31 DIAGNOSIS — R0989 Other specified symptoms and signs involving the circulatory and respiratory systems: Secondary | ICD-10-CM

## 2015-08-31 DIAGNOSIS — Z888 Allergy status to other drugs, medicaments and biological substances status: Secondary | ICD-10-CM

## 2015-08-31 DIAGNOSIS — Z7982 Long term (current) use of aspirin: Secondary | ICD-10-CM

## 2015-08-31 DIAGNOSIS — R0902 Hypoxemia: Secondary | ICD-10-CM

## 2015-08-31 DIAGNOSIS — R0600 Dyspnea, unspecified: Secondary | ICD-10-CM | POA: Diagnosis not present

## 2015-08-31 DIAGNOSIS — E785 Hyperlipidemia, unspecified: Secondary | ICD-10-CM | POA: Diagnosis present

## 2015-08-31 DIAGNOSIS — Z8052 Family history of malignant neoplasm of bladder: Secondary | ICD-10-CM

## 2015-08-31 LAB — CBC WITH DIFFERENTIAL/PLATELET
BASOS ABS: 0 10*3/uL (ref 0.0–0.1)
BASOS PCT: 0 %
EOS PCT: 1 %
Eosinophils Absolute: 0.1 10*3/uL (ref 0.0–0.7)
HCT: 40 % (ref 36.0–46.0)
HEMOGLOBIN: 13.1 g/dL (ref 12.0–15.0)
LYMPHS PCT: 8 %
Lymphs Abs: 1 10*3/uL (ref 0.7–4.0)
MCH: 29.7 pg (ref 26.0–34.0)
MCHC: 32.8 g/dL (ref 30.0–36.0)
MCV: 90.7 fL (ref 78.0–100.0)
Monocytes Absolute: 1.2 10*3/uL — ABNORMAL HIGH (ref 0.1–1.0)
Monocytes Relative: 9 %
NEUTROS ABS: 10.7 10*3/uL — AB (ref 1.7–7.7)
NEUTROS PCT: 82 %
PLATELETS: 237 10*3/uL (ref 150–400)
RBC: 4.41 MIL/uL (ref 3.87–5.11)
RDW: 14.9 % (ref 11.5–15.5)
WBC: 13.1 10*3/uL — AB (ref 4.0–10.5)

## 2015-08-31 LAB — BASIC METABOLIC PANEL
Anion gap: 8 (ref 5–15)
BUN: 10 mg/dL (ref 6–20)
CHLORIDE: 97 mmol/L — AB (ref 101–111)
CO2: 27 mmol/L (ref 22–32)
CREATININE: 0.67 mg/dL (ref 0.44–1.00)
Calcium: 9.6 mg/dL (ref 8.9–10.3)
Glucose, Bld: 105 mg/dL — ABNORMAL HIGH (ref 65–99)
POTASSIUM: 4.3 mmol/L (ref 3.5–5.1)
SODIUM: 132 mmol/L — AB (ref 135–145)

## 2015-08-31 LAB — BRAIN NATRIURETIC PEPTIDE: B Natriuretic Peptide: 358.8 pg/mL — ABNORMAL HIGH (ref 0.0–100.0)

## 2015-08-31 LAB — I-STAT TROPONIN, ED: TROPONIN I, POC: 0.03 ng/mL (ref 0.00–0.08)

## 2015-08-31 LAB — SEDIMENTATION RATE: SED RATE: 63 mm/h — AB (ref 0–22)

## 2015-08-31 MED ORDER — ONDANSETRON HCL 4 MG/2ML IJ SOLN: 4.0000 mg | Freq: Four times a day (QID) | INTRAMUSCULAR | Status: DC | PRN

## 2015-08-31 MED ORDER — IPRATROPIUM-ALBUTEROL 0.5-2.5 (3) MG/3ML IN SOLN
3.0000 mL | RESPIRATORY_TRACT | Status: DC | PRN
  Administered 2015-08-31: 3 mL via RESPIRATORY_TRACT
  Filled 2015-08-31: qty 3

## 2015-08-31 MED ORDER — ONDANSETRON HCL 4 MG PO TABS: 4.0000 mg | ORAL_TABLET | Freq: Four times a day (QID) | ORAL | Status: DC | PRN

## 2015-08-31 MED ORDER — VANCOMYCIN HCL IN DEXTROSE 1-5 GM/200ML-% IV SOLN
1000.0000 mg | Freq: Once | INTRAVENOUS | Status: AC
  Administered 2015-08-31: 1000 mg via INTRAVENOUS
  Filled 2015-08-31: qty 200

## 2015-08-31 MED ORDER — SERTRALINE HCL 100 MG PO TABS
100.0000 mg | ORAL_TABLET | Freq: Every day | ORAL | Status: DC
  Administered 2015-08-31 – 2015-09-04 (×5): 100 mg via ORAL
  Filled 2015-08-31 (×5): qty 1

## 2015-08-31 MED ORDER — MAGNESIUM 250 MG PO TABS: ORAL_TABLET | Freq: Every day | ORAL | Status: DC

## 2015-08-31 MED ORDER — ASPIRIN EC 325 MG PO TBEC
325.0000 mg | DELAYED_RELEASE_TABLET | Freq: Every day | ORAL | Status: DC
  Administered 2015-08-31 – 2015-09-04 (×5): 325 mg via ORAL
  Filled 2015-08-31 (×5): qty 1

## 2015-08-31 MED ORDER — PREDNISONE 20 MG PO TABS: 40.0000 mg | ORAL_TABLET | Freq: Every day | ORAL | Status: DC

## 2015-08-31 MED ORDER — MAGNESIUM OXIDE 400 (241.3 MG) MG PO TABS
200.0000 mg | ORAL_TABLET | Freq: Every day | ORAL | Status: DC
  Administered 2015-08-31 – 2015-09-03 (×4): 200 mg via ORAL
  Filled 2015-08-31 (×5): qty 1

## 2015-08-31 MED ORDER — FUROSEMIDE 20 MG PO TABS
20.0000 mg | ORAL_TABLET | Freq: Every day | ORAL | Status: DC
  Administered 2015-09-01: 20 mg via ORAL
  Filled 2015-08-31 (×2): qty 1

## 2015-08-31 MED ORDER — IPRATROPIUM-ALBUTEROL 0.5-2.5 (3) MG/3ML IN SOLN: 3.0000 mL | RESPIRATORY_TRACT | Status: DC | PRN

## 2015-08-31 MED ORDER — IRBESARTAN 150 MG PO TABS
150.0000 mg | ORAL_TABLET | Freq: Every day | ORAL | Status: DC
  Administered 2015-09-01: 150 mg via ORAL
  Filled 2015-08-31 (×2): qty 1

## 2015-08-31 MED ORDER — TRAMADOL HCL 50 MG PO TABS
50.0000 mg | ORAL_TABLET | Freq: Four times a day (QID) | ORAL | Status: DC | PRN
  Filled 2015-08-31 (×2): qty 1

## 2015-08-31 MED ORDER — PREDNISONE 20 MG PO TABS
40.0000 mg | ORAL_TABLET | Freq: Once | ORAL | Status: AC
  Administered 2015-08-31: 40 mg via ORAL
  Filled 2015-08-31: qty 2

## 2015-08-31 MED ORDER — IPRATROPIUM-ALBUTEROL 0.5-2.5 (3) MG/3ML IN SOLN
3.0000 mL | Freq: Four times a day (QID) | RESPIRATORY_TRACT | Status: DC
  Administered 2015-08-31 – 2015-09-01 (×4): 3 mL via RESPIRATORY_TRACT
  Filled 2015-08-31 (×5): qty 3

## 2015-08-31 MED ORDER — PIPERACILLIN-TAZOBACTAM 3.375 G IVPB
3.3750 g | Freq: Once | INTRAVENOUS | Status: AC
  Administered 2015-08-31: 3.375 g via INTRAVENOUS
  Filled 2015-08-31: qty 50

## 2015-08-31 MED ORDER — GUAIFENESIN ER 600 MG PO TB12
600.0000 mg | ORAL_TABLET | Freq: Two times a day (BID) | ORAL | Status: DC
  Administered 2015-08-31 – 2015-09-04 (×8): 600 mg via ORAL
  Filled 2015-08-31 (×8): qty 1

## 2015-08-31 MED ORDER — IOHEXOL 350 MG/ML SOLN
100.0000 mL | Freq: Once | INTRAVENOUS | Status: AC | PRN
  Administered 2015-08-31: 100 mL via INTRAVENOUS

## 2015-08-31 MED ORDER — ENOXAPARIN SODIUM 40 MG/0.4ML ~~LOC~~ SOLN
40.0000 mg | SUBCUTANEOUS | Status: DC
  Administered 2015-08-31 – 2015-09-03 (×4): 40 mg via SUBCUTANEOUS
  Filled 2015-08-31 (×4): qty 0.4

## 2015-08-31 MED ORDER — FLUTICASONE PROPIONATE 50 MCG/ACT NA SUSP
2.0000 | Freq: Every day | NASAL | Status: DC | PRN
  Administered 2015-09-01 – 2015-09-02 (×2): 2 via NASAL
  Filled 2015-08-31: qty 16

## 2015-08-31 MED ORDER — VITAMIN D 1000 UNITS PO TABS
1000.0000 [IU] | ORAL_TABLET | Freq: Every day | ORAL | Status: DC
  Administered 2015-08-31 – 2015-09-04 (×5): 1000 [IU] via ORAL
  Filled 2015-08-31 (×5): qty 1

## 2015-08-31 MED ORDER — PANTOPRAZOLE SODIUM 40 MG PO TBEC
40.0000 mg | DELAYED_RELEASE_TABLET | Freq: Every day | ORAL | Status: DC
  Administered 2015-08-31 – 2015-09-04 (×5): 40 mg via ORAL
  Filled 2015-08-31 (×5): qty 1

## 2015-08-31 MED ORDER — BENZONATATE 100 MG PO CAPS: 200.0000 mg | ORAL_CAPSULE | Freq: Three times a day (TID) | ORAL | Status: DC | PRN

## 2015-08-31 NOTE — ED Notes (Signed)
UNABLE TO COLLECT LABS AT THIS TIME PATIENT IS NOT IN ROOM.

## 2015-08-31 NOTE — Progress Notes (Addendum)
[facility] ED Consulted by EDP, Liu Cm reviewed EPIC notes for pt, labs, imaging CM spoke with pt to confirm her recent 3 day stay from 08/05/15 to 08/05/15.  Pt confirmed f/u with Dr Melvyn Novas her pulmonologist on 08/15/15 and her pcp on 08/22/15 Noted for each MD OV in EPIC  Pt confirms she has been able to get her medications, is compliant with medications and had been followed by [facility] RN, PT/OT but not an aide per pt Reports she was last seen by HHPT on 08/26/15 and is schedule to be seen today but she is in [facility] ED Reports the female HHPT "he worked me hard and I did not feel good after he left." "Tye [place] was suppose to call me today"   ED CM spoke with Dr Oleta Mouse. 1418 Left voice message for medical director  1435 Spoke with Dr Doyle Askew & Dr Charlies [place] spoke with pt, Medical Director, Dr Doyle Askew and Dr Viviana Simpler See orders

## 2015-08-31 NOTE — ED Notes (Signed)
Pt got down to 75% on the trip to the bathroom.   Had to turn around.

## 2015-08-31 NOTE — ED Provider Notes (Addendum)
CSN: [id number]     Arrival date & time 08/31/15  1117 History   First MD Initiated Contact with Patient 08/31/15 1121     Chief Complaint  Patient presents with  . Shortness of Breath     (Consider location/radiation/quality/duration/timing/severity/associated sxs/prior Treatment) HPI 81 year old female who presents with shortness of breath. History of pulmonary fibrosis, CAD, chronic diastolic heart failure, hypertension, hyperlipidemia, and chronic respiratory failure on 2 L home oxygen. Has been on steroids for one year, and followed by Dr. Melvyn Novas from pulmonary. She states that he has been trying to wean her off of her steroids over the past several weeks, and this week was supposed to wean down from 20 mg of prednisone daily to 15 mg of prednisone daily. With her weaned over the past few weeks she has been having progressive dyspnea on exertion. Has been unable to wean to 15 mg of prednisone over this week, take an additional 5 mg to improve her symptoms. She attempted to take 15 mg yesterday, and this morning felt extremely weak and short of breath, and unable to ambulate to the restroom with her home oxygen. Has had increasing cough with clear sputum production as well. Denies any fevers, chills, chest pain, nausea or vomiting. Denies any associating leg swelling orthopnea or PND.    Past Medical History  Diagnosis Date  . Dyspnea   . Diverticulosis 2008  . Internal hemorrhoids   . Esophageal stricture   . Arthritis   . Breast cancer ([place]) 1969  . GERD (gastroesophageal reflux disease)   . Gallstones   . Depression   . HLD (hyperlipidemia)   . HTN (hypertension)   . Obesity   . Colon polyp 2013    TUBULAR ADENOMA (X1  . Pulmonary fibrosis ([place])   . CAD (coronary artery disease)   . Chronic diastolic CHF (congestive heart failure) ([place]) 08/01/2015   Past Surgical History  Procedure Laterality Date  . Abdominal hysterectomy    . Cholecystectomy    . Mastectomy      right   . Appendectomy    . Foot surgery    . Breast surgery     Family History  Problem Relation Age of Onset  . Heart disease Mother   . Cancer Mother     bladder  . Esophageal cancer Brother   . Prostate cancer Brother   . Heart disease Brother   . Cancer Brother     sarcoma  . Stomach cancer Neg Hx    Social History  Substance Use Topics  . Smoking status: Never Smoker   . Smokeless tobacco: Never Used  . Alcohol Use: 4.2 oz/week    7 Glasses of wine per week     Comment: ocaasional   OB History    No data available     Review of Systems 10/14 systems reviewed and are negative other than those stated in the HPI    Allergies  Anesthetics, amide; Codeine; Darvocet; Antihistamines, loratadine-type; and Moxifloxacin  Home Medications   Prior to Admission medications   Medication Sig Start Date End Date Taking? Authorizing Provider  acetaminophen (TYLENOL 8 HOUR) 650 MG CR tablet Take 1,300 mg by mouth 2 (two) times daily as needed for pain.    Yes Historical Provider, MD  aspirin EC 325 MG EC tablet Take 1 tablet (325 mg total) by mouth daily. 08/20/14  Yes Marton Redwood, MD  benzonatate (TESSALON) 200 MG capsule Take 1 capsule (200 mg total) by mouth 3 (three)  times daily as needed for cough. 08/05/15  Yes Robbie Lis, MD  cholecalciferol (VITAMIN D) 1000 UNITS tablet Take 1,000 Units by mouth daily.   Yes Historical Provider, MD  esomeprazole (NEXIUM) 20 MG capsule Take 20 mg by mouth 2 (two) times daily.    Yes Historical Provider, MD  fluticasone (FLONASE) 50 MCG/ACT nasal spray Place 2 sprays into both nostrils once as needed for allergies or rhinitis.   Yes Historical Provider, MD  furosemide (LASIX) 20 MG tablet Take 20 mg by mouth daily.    Yes Historical Provider, MD  guaiFENesin (MUCINEX) 600 MG 12 hr tablet Take 1 tablet (600 mg total) by mouth 2 (two) times daily. 08/20/14  Yes Marton Redwood, MD  ipratropium-albuterol (DUONEB) 0.5-2.5 (3) MG/3ML SOLN Take 3 mLs by  nebulization every 6 (six) hours as needed. Patient taking differently: Take 3 mLs by nebulization every 6 (six) hours as needed (sob).  08/05/15  Yes Robbie Lis, MD  Magnesium 250 MG TABS Take by mouth daily.   Yes Historical Provider, MD  MELATONIN PO Take 1 tablet by mouth at bedtime.   Yes Historical Provider, MD  predniSONE (DELTASONE) 20 MG tablet Take 2 tablets (40 mg total) by mouth daily with breakfast. 08/05/15  Yes Robbie Lis, MD  sertraline (ZOLOFT) 100 MG tablet Take 100 mg by mouth daily.    Yes Historical Provider, MD  sodium chloride (OCEAN) 0.65 % SOLN nasal spray Place 1 spray into both nostrils 2 (two) times daily. Patient taking differently: Place 1 spray into both nostrils as needed for congestion.  08/20/14  Yes Marton Redwood, MD  traMADol (ULTRAM) 50 MG tablet Take 50 mg by mouth every 6 (six) hours as needed for moderate pain.    Yes Historical Provider, MD  valsartan (DIOVAN) 320 MG tablet Take 1 tablet by mouth daily. 06/30/15  Yes Historical Provider, MD   BP 140/46 mmHg  Pulse 91  Temp(Src) 98.6 F (37 C) (Oral)  Resp 20  SpO2 95% Physical Exam Physical Exam  Nursing note and vitals reviewed. Constitutional: Elderly woman, Well developed, well nourished, non-toxic, and in no acute distress Head: Normocephalic and atraumatic.  Mouth/Throat: Oropharynx is clear and moist.  Neck: Normal range of motion. Neck supple.  Cardiovascular: Normal rate and regular rhythm.   Pulmonary/Chest: Effort normal and breath sounds normal. No conversational dyspnea. Crackles at the left lung base.  Abdominal: Soft. There is no tenderness. There is no rebound and no guarding.  Musculoskeletal: Normal range of motion.  Neurological: Alert, no facial droop, fluent speech, moves all extremities symmetrically Skin: Skin is warm and dry.  Psychiatric: Cooperative  ED Course  Procedures (including critical care time) Labs Review Labs Reviewed  CBC WITH DIFFERENTIAL/PLATELET -  Abnormal; Notable for the following:    WBC 13.1 (*)    Neutro Abs 10.7 (*)    Monocytes Absolute 1.2 (*)    All other components within normal limits  BASIC METABOLIC PANEL - Abnormal; Notable for the following:    Sodium 132 (*)    Chloride 97 (*)    Glucose, Bld 105 (*)    All other components within normal limits  BRAIN NATRIURETIC PEPTIDE - Abnormal; Notable for the following:    B Natriuretic Peptide 358.8 (*)    All other components within normal limits  I-STAT TROPOININ, ED    Imaging Review Dg Chest 2 View  08/31/2015  CLINICAL DATA:  Shortness of breath. History of pulmonary fibrosis, chronic diastolic  heart failure, hypertension, on 2 L home oxygen. On steroids for 1 year. EXAM: CHEST  2 VIEW COMPARISON:  Chest x-rays dated 08/01/2015 and 11/09/2014. FINDINGS: Changes of chronic fibrosis again noted bilaterally, most severe within the right upper lobe and at each lung base. Slightly increased opacity is now seen throughout the left lung. Heart size is upper normal, unchanged. Overall cardiomediastinal silhouette is stable. No pleural effusion identified. No pneumothorax. No acute osseous abnormality. IMPRESSION: Chronic fibrosis bilaterally, most prominent within the right upper lobe and at each lung base. Increased opacity throughout the left lung. This could represent superimposed edema or pneumonia. Electronically Signed   By: Franki Cabot M.D.   On: 08/31/2015 12:26   I have personally reviewed and evaluated these images and lab results as part of my medical decision-making.   EKG Interpretation   Date/Time:  Tuesday August 31 2015 12:37:44 EDT Ventricular Rate:  99 PR Interval:  164 QRS Duration: 99 QT Interval:  337 QTC Calculation: 432 R Axis:   102 Text Interpretation:  Sinus rhythm Probable left atrial enlargement Right  axis deviation Probable anteroseptal infarct, old No significant change  since last tracing Confirmed by Demere Dotzler MD, Dalisa Forrer [phone]) on 08/31/2015  2:08:11  PM     MDM   Final diagnoses:  HCAP (healthcare-associated pneumonia)  Pulmonary fibrosis ([place])  Chronic respiratory failure with hypoxia ([place])   81 year old female with history of pulmonary fibrosis, CAD, and chronic diastolic heart failure on 2 L home oxygen who presents with worsening weakness and dyspnea on exertion. She is chronically ill-appearing on presentation, but nontoxic and in no acute distress. Breathing comfortably at rest without conversational dyspnea, but with moving in her ED gurney during the physical exam, she becomes extremely winded and requires a few minutes before those symptoms resolve.  Symptoms seem most consistent with a flare of pulmonary fibrosis especially in the setting of coming down on her chronic steroids. CXR showing a worsening infiltrate in the left lower lobe, that could be infectious versus edema. Clinically does not seem fluid overloaded, and she denies any fluid overload type symptoms. EKG non-ischemic. Troponin x 1 negative. BNP near baseline. Given her chronic steroid use, she is immunosuppressed, and in the setting of her increasing cough with dyspnea on exertion will cover for hospital-acquired pneumonia, since she was last admitted in September of this year. Given a dose of vancomycin and Zosyn. Symptoms did mildly improve with breathing treatments, and she was given increased dosage of her prednisone 40 mg. I discussed this patient with [facility] hospitalist who will admit for ongoing management.   Spoke to Dr. Marlene Lard, her pulmonologist. Recommended CTA chest instead of CT chest w/o contrast. Also recommended obtaining ESR, as it more of a predictor of her having PF flare. Also states that she on her last clinic visit ambulated without significant respiratory symptoms or hypoxia on room air. Today, with ambulate on home oxygen, she has hypoxia to 75% with significantly worsened work of breathing   Forde Dandy, MD 08/31/15 [place] Natesha Hassey,  MD 08/31/15 [place] [facility] [place], MD 08/31/15 1714

## 2015-08-31 NOTE — H&P (Addendum)
[facility] History and Physical  ARLYCE CIRCLE KLK:[id number] DOB: 05-04-1928 DOA: 08/31/2015  Referring physician: ED physician, Dr. Oleta Mouse  PCP: Binnie Rail, MD   Chief Complaint: dyspnea   HPI:  81 year old female with past medical history of end stage pulmonary fibrosis on oxygen at home, follows with Dr. Melvyn Novas of [facility], chronic diastolic CHF, anemia, hypertension, depression who presented to [facility] with worsening shortness of breath worse with exertion. She reports she was having difficulty ambulating due to dyspnea and felt she needed to come to ED. She reports she has had Prednisone tapered down to 20 mg PO QD and she explains that every time this is tapered down she starts feeling worse.   On admission, pt is hemodynamically stable, oxygen sat stable on 3 L (apparently desaturated to 70's with ambulation). [facility] asked to admit for further evaluation.   Assessment and Plan:  Principal Problem: Pulmonary fibrosis / acute respiratory failure with hypoxia - end stage pulmonary fibrosis - keep on Prednisone 40 mg PO QD for now with close outpatient follow up to prevent re hospitalizations - no need for ABX at this time as no clear evidence of PNA on CT chest  - please note that pt has been recently treated with ABX one month ago, so would try to avoid ABX unless needed - pt is afebrile and WBC elevation is likely steroid induced rather than an infectious etiology  - She will continue DuoNeb scheduled and as needed   Active Problems: Chronic diastolic heart failure - 2 D ECHO on last admission with preserved EF, grade 1 diastolic dysfunction - her weight is at baseline around 68 - 69 kg (was 68.6 kg on last discharge) - monitor weights   Gastroesophageal reflux disease  - Continue protonix  Essential hypertension  - Continue Avapro and Lasix.  Coronary artery disease - Stable.  - no reports of chest pain.   Leukocytosis - secondary to steroids.  -  CBC in AM  Hyponatremia - Secondary to CHF etiology - Sodium level around baseline - BMP in AM  DVT prophylaxis - Lovenox subQ ordered in hospital   Code Status: DNR/DNI Family Communication: plan of care discussed with the patient and her brother at the bedside   Radiological Exams on Admission:  Dg Chest 2 View  08/31/2015  CLINICAL DATA:  Shortness of breath. History of pulmonary fibrosis, chronic diastolic heart failure, hypertension, on 2 L home oxygen. On steroids for 1 year. EXAM: CHEST  2 VIEW COMPARISON:  Chest x-rays dated 08/01/2015 and 11/09/2014. FINDINGS: Changes of chronic fibrosis again noted bilaterally, most severe within the right upper lobe and at each lung base. Slightly increased opacity is now seen throughout the left lung. Heart size is upper normal, unchanged. Overall cardiomediastinal silhouette is stable. No pleural effusion identified. No pneumothorax. No acute osseous abnormality. IMPRESSION: Chronic fibrosis bilaterally, most prominent within the right upper lobe and at each lung base. Increased opacity throughout the left lung. This could represent superimposed edema or pneumonia. Electronically Signed   By: Franki Cabot M.D.   On: 08/31/2015 12:26   Ct Angio Chest Pe W/cm &/or Wo Cm  08/31/2015  CLINICAL DATA:  Short breath, history of pulmonary fibrosis. Chronic systolic heart failure. EXAM: CT ANGIOGRAPHY CHEST WITH CONTRAST TECHNIQUE: Multidetector CT imaging of the chest was performed using the standard protocol during bolus administration of intravenous contrast. Multiplanar CT image reconstructions and MIPs were obtained to evaluate the vascular anatomy. CONTRAST:  163mL OMNIPAQUE IOHEXOL 350 MG/ML SOLN COMPARISON:  CT thorax 08/15/2014 FINDINGS: Mediastinum/Nodes: There are no filling defects within the pulmonary arteries to suggest acute pulmonary embolism. No acute findings aorta great vessels. No pericardial fluid. Coronary calcifications are  present. No axillary supraclavicular adenopathy. No mediastinal adenopathy. Esophagus. Lungs/Pleura: There is architectural distortion in the upper lobes with bronchiectasis. There scattered ground-glass opacities in the upper lobes. There is peripheral reticulation and architectural distortion in the lower lobes with ground-glass opacities. No focal consolidation. Central airways are normal. The findings of architectural distortion and ground-glass opacities are similar to comparison CT of 08/15/2014. Upper abdomen: Large benign hepatic cysts.  Adrenal glands normal. Musculoskeletal: No aggressive osseous lesion. Review of the MIP images confirms the above findings. IMPRESSION: 1. No evidence acute pulmonary embolism. 2. Chronic interstitial lung disease, bronchiectasis, and severe ground-glass opacities not changed from CT of 08/15/2014. Electronically Signed   By: Suzy Bouchard M.D.   On: 08/31/2015 16:58      [facility] [phone]  Review of Systems:  Constitutional: Negative for fever, chills and malaise/fatigue. Negative for diaphoresis.  HENT: Negative for hearing loss, ear pain, nosebleeds, congestion, sore throat, neck pain, tinnitus and ear discharge.   Eyes: Negative for blurred vision, double vision, photophobia, pain, discharge and redness.  Respiratory: Per HPI Cardiovascular: Negative for chest pain, palpitations, orthopnea, claudication and leg swelling.  Gastrointestinal: Negative for heartburn, constipation, blood in stool and melena.  Genitourinary: Negative for dysuria, urgency, frequency, hematuria and flank pain.  Musculoskeletal: Negative for myalgias, back pain, joint pain and falls.  Skin: Negative for itching and rash.  Neurological: Negative for dizziness and weakness.  Endo/Heme/Allergies: Negative for environmental allergies and polydipsia. Does not bruise/bleed easily.  Psychiatric/Behavioral: Negative for suicidal ideas. The patient is not nervous/anxious.       Past Medical History  Diagnosis Date  . Dyspnea   . Diverticulosis 2008  . Internal hemorrhoids   . Esophageal stricture   . Arthritis   . Breast cancer ([place]) 1969  . GERD (gastroesophageal reflux disease)   . Gallstones   . Depression   . HLD (hyperlipidemia)   . HTN (hypertension)   . Obesity   . Colon polyp 2013    TUBULAR ADENOMA (X1  . Pulmonary fibrosis ([place])   . CAD (coronary artery disease)   . Chronic diastolic CHF (congestive heart failure) ([place]) 08/01/2015    Past Surgical History  Procedure Laterality Date  . Abdominal hysterectomy    . Cholecystectomy    . Mastectomy      right  . Appendectomy    . Foot surgery    . Breast surgery      Social History:  reports that she has never smoked. She has never used smokeless tobacco. She reports that she drinks about 4.2 oz of alcohol per week. She reports that she does not use illicit drugs.  Allergies  Allergen Reactions  . Anesthetics, Amide Nausea And Vomiting  . Codeine Nausea And Vomiting  . Darvocet [Propoxyphene N-Acetaminophen] Nausea And Vomiting  . Antihistamines, Loratadine-Type Other (See Comments)    Weird dreams  . Moxifloxacin Anxiety and Other (See Comments)    Nightmares    Family History  Problem Relation Age of Onset  . Heart disease Mother   . Cancer Mother     bladder  . Esophageal cancer Brother   . Prostate cancer Brother   . Heart disease Brother   . Cancer Brother     sarcoma  .  Stomach cancer Neg Hx     Prior to Admission medications   Medication Sig Start Date End Date Taking? Authorizing Provider  acetaminophen (TYLENOL 8 HOUR) 650 MG CR tablet Take 1,300 mg by mouth 2 (two) times daily as needed for pain.    Yes Historical Provider, MD  aspirin EC 325 MG EC tablet Take 1 tablet (325 mg total) by mouth daily. 08/20/14  Yes Marton Redwood, MD  benzonatate (TESSALON) 200 MG capsule Take 1 capsule (200 mg total) by mouth 3 (three) times daily as needed for cough. 08/05/15   Yes Robbie Lis, MD  cholecalciferol (VITAMIN D) 1000 UNITS tablet Take 1,000 Units by mouth daily.   Yes Historical Provider, MD  esomeprazole (NEXIUM) 20 MG capsule Take 20 mg by mouth 2 (two) times daily.    Yes Historical Provider, MD  fluticasone (FLONASE) 50 MCG/ACT nasal spray Place 2 sprays into both nostrils once as needed for allergies or rhinitis.   Yes Historical Provider, MD  furosemide (LASIX) 20 MG tablet Take 20 mg by mouth daily.    Yes Historical Provider, MD  guaiFENesin (MUCINEX) 600 MG 12 hr tablet Take 1 tablet (600 mg total) by mouth 2 (two) times daily. 08/20/14  Yes Marton Redwood, MD  ipratropium-albuterol (DUONEB) 0.5-2.5 (3) MG/3ML SOLN Take 3 mLs by nebulization every 6 (six) hours as needed. Patient taking differently: Take 3 mLs by nebulization every 6 (six) hours as needed (sob).  08/05/15  Yes Robbie Lis, MD  Magnesium 250 MG TABS Take by mouth daily.   Yes Historical Provider, MD  MELATONIN PO Take 1 tablet by mouth at bedtime.   Yes Historical Provider, MD  predniSONE (DELTASONE) 20 MG tablet Take 2 tablets (40 mg total) by mouth daily with breakfast. 08/05/15  Yes Robbie Lis, MD  sertraline (ZOLOFT) 100 MG tablet Take 100 mg by mouth daily.    Yes Historical Provider, MD  sodium chloride (OCEAN) 0.65 % SOLN nasal spray Place 1 spray into both nostrils 2 (two) times daily. Patient taking differently: Place 1 spray into both nostrils as needed for congestion.  08/20/14  Yes Marton Redwood, MD  traMADol (ULTRAM) 50 MG tablet Take 50 mg by mouth every 6 (six) hours as needed for moderate pain.    Yes Historical Provider, MD  valsartan (DIOVAN) 320 MG tablet Take 1 tablet by mouth daily. 06/30/15  Yes Historical Provider, MD    Physical Exam: Filed Vitals:   08/31/15 1124 08/31/15 1204 08/31/15 1229 08/31/15 1500  BP:  142/76 140/46 118/53  Pulse:  89 91 90  Temp:  98.6 F (37 C)    TempSrc:  Oral    Resp:  20 20 22   SpO2: 93% 94% 95% 96%    Physical Exam   Constitutional: Appears well-developed and well-nourished. No distress.  HENT: Normocephalic. External right and left ear normal. Oropharynx is clear and moist.  Eyes: Conjunctivae and EOM are normal. PERRLA, no scleral icterus.  Neck: Normal ROM. Neck supple. No JVD. No tracheal deviation. No thyromegaly.  CVS: RRR, S1/S2 +, no gallops, no carotid bruit.  Pulmonary: Effort and breath sounds normal, no wheezing, diminished breath sounds at bases  Abdominal: Soft. BS +,  no distension, tenderness, rebound or guarding.  Musculoskeletal: Normal range of motion. No edema and no tenderness.  Lymphadenopathy: No lymphadenopathy noted, cervical, inguinal. Neuro: Alert. Normal reflexes, muscle tone coordination. No cranial nerve deficit. Skin: Skin is warm and dry. No rash noted. Not diaphoretic. No  erythema. No pallor.  Psychiatric: Normal mood and affect. Behavior, judgment, thought content normal.   Labs on Admission:  Basic Metabolic Panel:  Recent Labs Lab 08/31/15 1232  NA 132*  K 4.3  CL 97*  CO2 27  GLUCOSE 105*  BUN 10  CREATININE 0.67  CALCIUM 9.6   Liver Function Tests: No results for input(s): AST, ALT, ALKPHOS, BILITOT, PROT, ALBUMIN in the last 168 hours. No results for input(s): LIPASE, AMYLASE in the last 168 hours. No results for input(s): AMMONIA in the last 168 hours. CBC:  Recent Labs Lab 08/31/15 1232  WBC 13.1*  NEUTROABS 10.7*  HGB 13.1  HCT 40.0  MCV 90.7  PLT 237    EKG: pending    If 7PM-7AM, please contact night-coverage [email or web address] Password [facility] 08/31/2015, 3:46 PM

## 2015-08-31 NOTE — ED Notes (Signed)
Patient here from home with complaints of sob cough x1 day and epigastric pain when coughing. 2L O2 home.

## 2015-08-31 NOTE — ED Notes (Signed)
Bed: WA07 Expected date:  Expected time:  Means of arrival:  Comments: Ems 81yo

## 2015-08-31 NOTE — ED Notes (Signed)
Pt can go anytime charge nurse knows.

## 2015-09-01 DIAGNOSIS — Z8052 Family history of malignant neoplasm of bladder: Secondary | ICD-10-CM | POA: Diagnosis not present

## 2015-09-01 DIAGNOSIS — Z86010 Personal history of colonic polyps: Secondary | ICD-10-CM | POA: Diagnosis not present

## 2015-09-01 DIAGNOSIS — Z9981 Dependence on supplemental oxygen: Secondary | ICD-10-CM | POA: Diagnosis not present

## 2015-09-01 DIAGNOSIS — Z888 Allergy status to other drugs, medicaments and biological substances status: Secondary | ICD-10-CM | POA: Diagnosis not present

## 2015-09-01 DIAGNOSIS — E785 Hyperlipidemia, unspecified: Secondary | ICD-10-CM | POA: Diagnosis present

## 2015-09-01 DIAGNOSIS — J209 Acute bronchitis, unspecified: Secondary | ICD-10-CM | POA: Diagnosis not present

## 2015-09-01 DIAGNOSIS — I5032 Chronic diastolic (congestive) heart failure: Secondary | ICD-10-CM | POA: Diagnosis not present

## 2015-09-01 DIAGNOSIS — Z7982 Long term (current) use of aspirin: Secondary | ICD-10-CM | POA: Diagnosis not present

## 2015-09-01 DIAGNOSIS — K219 Gastro-esophageal reflux disease without esophagitis: Secondary | ICD-10-CM | POA: Diagnosis present

## 2015-09-01 DIAGNOSIS — J189 Pneumonia, unspecified organism: Secondary | ICD-10-CM | POA: Diagnosis present

## 2015-09-01 DIAGNOSIS — Y95 Nosocomial condition: Secondary | ICD-10-CM | POA: Diagnosis present

## 2015-09-01 DIAGNOSIS — J9601 Acute respiratory failure with hypoxia: Secondary | ICD-10-CM | POA: Diagnosis not present

## 2015-09-01 DIAGNOSIS — E871 Hypo-osmolality and hyponatremia: Secondary | ICD-10-CM

## 2015-09-01 DIAGNOSIS — R262 Difficulty in walking, not elsewhere classified: Secondary | ICD-10-CM | POA: Diagnosis present

## 2015-09-01 DIAGNOSIS — J8410 Pulmonary fibrosis, unspecified: Secondary | ICD-10-CM | POA: Diagnosis not present

## 2015-09-01 DIAGNOSIS — J470 Bronchiectasis with acute lower respiratory infection: Secondary | ICD-10-CM | POA: Diagnosis present

## 2015-09-01 DIAGNOSIS — Z8249 Family history of ischemic heart disease and other diseases of the circulatory system: Secondary | ICD-10-CM | POA: Diagnosis not present

## 2015-09-01 DIAGNOSIS — I110 Hypertensive heart disease with heart failure: Secondary | ICD-10-CM | POA: Diagnosis present

## 2015-09-01 DIAGNOSIS — Z853 Personal history of malignant neoplasm of breast: Secondary | ICD-10-CM | POA: Diagnosis not present

## 2015-09-01 DIAGNOSIS — I5042 Chronic combined systolic (congestive) and diastolic (congestive) heart failure: Secondary | ICD-10-CM | POA: Diagnosis present

## 2015-09-01 DIAGNOSIS — Z515 Encounter for palliative care: Secondary | ICD-10-CM | POA: Diagnosis not present

## 2015-09-01 DIAGNOSIS — J219 Acute bronchiolitis, unspecified: Secondary | ICD-10-CM

## 2015-09-01 DIAGNOSIS — Z885 Allergy status to narcotic agent status: Secondary | ICD-10-CM | POA: Diagnosis not present

## 2015-09-01 DIAGNOSIS — T380X5A Adverse effect of glucocorticoids and synthetic analogues, initial encounter: Secondary | ICD-10-CM | POA: Diagnosis present

## 2015-09-01 DIAGNOSIS — R0602 Shortness of breath: Secondary | ICD-10-CM | POA: Diagnosis not present

## 2015-09-01 DIAGNOSIS — M1990 Unspecified osteoarthritis, unspecified site: Secondary | ICD-10-CM | POA: Diagnosis present

## 2015-09-01 DIAGNOSIS — I2510 Atherosclerotic heart disease of native coronary artery without angina pectoris: Secondary | ICD-10-CM | POA: Diagnosis present

## 2015-09-01 DIAGNOSIS — Z800 Family history of malignant neoplasm of digestive organs: Secondary | ICD-10-CM | POA: Diagnosis not present

## 2015-09-01 DIAGNOSIS — Z881 Allergy status to other antibiotic agents status: Secondary | ICD-10-CM | POA: Diagnosis not present

## 2015-09-01 DIAGNOSIS — Z79899 Other long term (current) drug therapy: Secondary | ICD-10-CM | POA: Diagnosis not present

## 2015-09-01 DIAGNOSIS — J440 Chronic obstructive pulmonary disease with acute lower respiratory infection: Secondary | ICD-10-CM | POA: Diagnosis not present

## 2015-09-01 DIAGNOSIS — Z884 Allergy status to anesthetic agent status: Secondary | ICD-10-CM | POA: Diagnosis not present

## 2015-09-01 DIAGNOSIS — Z7952 Long term (current) use of systemic steroids: Secondary | ICD-10-CM | POA: Diagnosis not present

## 2015-09-01 DIAGNOSIS — Z66 Do not resuscitate: Secondary | ICD-10-CM | POA: Diagnosis not present

## 2015-09-01 DIAGNOSIS — J9621 Acute and chronic respiratory failure with hypoxia: Secondary | ICD-10-CM | POA: Diagnosis present

## 2015-09-01 DIAGNOSIS — Z79891 Long term (current) use of opiate analgesic: Secondary | ICD-10-CM | POA: Diagnosis not present

## 2015-09-01 LAB — BASIC METABOLIC PANEL
Anion gap: 6 (ref 5–15)
BUN: 12 mg/dL (ref 6–20)
CHLORIDE: 99 mmol/L — AB (ref 101–111)
CO2: 28 mmol/L (ref 22–32)
CREATININE: 0.71 mg/dL (ref 0.44–1.00)
Calcium: 9.6 mg/dL (ref 8.9–10.3)
Glucose, Bld: 89 mg/dL (ref 65–99)
POTASSIUM: 4.2 mmol/L (ref 3.5–5.1)
SODIUM: 133 mmol/L — AB (ref 135–145)

## 2015-09-01 LAB — CBC
HCT: 36.3 % (ref 36.0–46.0)
Hemoglobin: 11.9 g/dL — ABNORMAL LOW (ref 12.0–15.0)
MCH: 29.8 pg (ref 26.0–34.0)
MCHC: 32.8 g/dL (ref 30.0–36.0)
MCV: 91 fL (ref 78.0–100.0)
PLATELETS: 238 10*3/uL (ref 150–400)
RBC: 3.99 MIL/uL (ref 3.87–5.11)
RDW: 15.2 % (ref 11.5–15.5)
WBC: 11 10*3/uL — AB (ref 4.0–10.5)

## 2015-09-01 LAB — SODIUM, URINE, RANDOM: Sodium, Ur: 36 mmol/L

## 2015-09-01 LAB — CREATININE, URINE, RANDOM: CREATININE, URINE: 100.28 mg/dL

## 2015-09-01 MED ORDER — IPRATROPIUM-ALBUTEROL 0.5-2.5 (3) MG/3ML IN SOLN
3.0000 mL | Freq: Three times a day (TID) | RESPIRATORY_TRACT | Status: DC
  Administered 2015-09-02 – 2015-09-04 (×7): 3 mL via RESPIRATORY_TRACT
  Filled 2015-09-01 (×7): qty 3

## 2015-09-01 MED ORDER — DOXYCYCLINE HYCLATE 100 MG IV SOLR
100.0000 mg | Freq: Two times a day (BID) | INTRAVENOUS | Status: DC
  Administered 2015-09-01 – 2015-09-02 (×4): 100 mg via INTRAVENOUS
  Filled 2015-09-01 (×5): qty 100

## 2015-09-01 MED ORDER — ZOLPIDEM TARTRATE 5 MG PO TABS
5.0000 mg | ORAL_TABLET | Freq: Once | ORAL | Status: AC
  Administered 2015-09-02: 5 mg via ORAL
  Filled 2015-09-01: qty 1

## 2015-09-01 MED ORDER — METHYLPREDNISOLONE SODIUM SUCC 40 MG IJ SOLR
40.0000 mg | Freq: Two times a day (BID) | INTRAMUSCULAR | Status: DC
  Administered 2015-09-01 – 2015-09-02 (×4): 40 mg via INTRAVENOUS
  Filled 2015-09-01 (×4): qty 1

## 2015-09-01 MED ORDER — SODIUM CHLORIDE 0.9 % IV SOLN: INTRAVENOUS | Status: DC

## 2015-09-01 NOTE — Care Management Note (Signed)
Case Management Note  Patient Details  Name: [name] MRN: [id number] Date of Birth: [birth year]  Subjective/Objective:  Pt admitted with Acute respiratory failure with hypoxia.                  Action/Plan:   Pt from home with [place], will need orders to resume at discharge.   Expected Discharge Date:   (unknown)               Expected Discharge Plan:  [place]  In-House Referral:     Discharge planning Services  CM Consult  Post Acute Care Choice:    Choice offered to:     DME Arranged:    DME Agency:     HH Arranged:  RN, PT, OT, Nurse's Aide [place] Agency:  [place]  Status of Service:     Medicare Important Message Given:    Date Medicare IM Given:    Medicare IM give by:    Date Additional Medicare IM Given:    Additional Medicare Important Message give by:     If discussed at [place] of Stay Meetings, dates discussed:    Additional CommentsPurcell Mouton, RN 09/01/2015, 3:23 PM

## 2015-09-01 NOTE — Progress Notes (Signed)
[facility]  Patient Status: Active (receiving services up to time of hospitalization)  [facility] is providing the following services: RN, PT and OT  If patient discharges after hours, please call [phone].   [name] 09/01/2015, 10:59 AM

## 2015-09-01 NOTE — Telephone Encounter (Signed)
LVM informing pt

## 2015-09-01 NOTE — Progress Notes (Addendum)
[facility] PROGRESS NOTE    Progress Note   [name] JAS:[id number] DOB: 06-27-1928 DOA: 08/31/2015 PCP: Binnie Rail, MD   Brief Narrative:   [name] is an 81 y.o. female past medical history of pulmonary fibrosis oxygen dependent and prednisone dependent that comes into the hospital for shortness of breath  Assessment/Plan:  Acute respiratory failure with hypoxia ([place]) due to pulmonary fibrosis and possibly acute bronchitis: Saturations remained greater than 90% on 2 L of oxygen, change her prednisone to Solu-Medrol IV Mild leukocytosis on admission she got a single dose of vancomycin and Zosyn and leukocytosis is improved, ESR was 63 on admission, I will restart on empiric antibiotic doxycycline, as I am concerned of acute bronchitis, she's had a new productive cough for the last several days and generalized weakness that has progressively gotten worse. Follow-up closely with his pulmonologist as an outpatient.  Chronic Hyponatremia: Check a fractional secretion of sodium, although may be unreliable as she is on Lasix. This may be due to her chronic diastolic heart failure  Chronic diastolic heart failure: 2-D echo last admission showed a preserved EF with a grade 1 diastolic heart failure. Estimated dry weight at baseline Her BNP was just mildly elevated at 300 she has no JVD on physical exam. Cont oral asix.  Essential hypertension: Continue current regimen no changes were made.  Leukocytosis: ? Due to infection, she had an elevated white blood cell count with a left shift on admission, she is on steroids at home, she received empiric antibiotic vancomycin and Zosyn on admission repeated CBC shows improvement leukocytosis.    DVT Prophylaxis - Lovenox ordered.  Family Communication: none Disposition Plan: Home when stable. Code Status:     Code Status Orders        Start     Ordered   08/31/15 1920  Do not attempt resuscitation (DNR)    Continuous    Question Answer Comment  In the event of cardiac or respiratory ARREST Do not call a "code blue"   In the event of cardiac or respiratory ARREST Do not perform Intubation, CPR, defibrillation or ACLS   In the event of cardiac or respiratory ARREST Use medication by any route, position, wound care, and other measures to relive pain and suffering. May use oxygen, suction and manual treatment of airway obstruction as needed for comfort.      08/31/15 1919    Advance Directive Documentation        Most Recent Value   Type of Advance Directive  Healthcare Power of Attorney, Living will   Pre-existing out of facility DNR order (yellow form or pink MOST form)     "MOST" Form in Place?          IV Access:    Peripheral IV   Procedures and diagnostic studies:   Dg Chest 2 View  08/31/2015  CLINICAL DATA:  Shortness of breath. History of pulmonary fibrosis, chronic diastolic heart failure, hypertension, on 2 L home oxygen. On steroids for 1 year. EXAM: CHEST  2 VIEW COMPARISON:  Chest x-rays dated 08/01/2015 and 11/09/2014. FINDINGS: Changes of chronic fibrosis again noted bilaterally, most severe within the right upper lobe and at each lung base. Slightly increased opacity is now seen throughout the left lung. Heart size is upper normal, unchanged. Overall cardiomediastinal silhouette is stable. No pleural effusion identified. No pneumothorax. No acute osseous abnormality. IMPRESSION: Chronic fibrosis bilaterally, most prominent within the right upper lobe and at each lung  base. Increased opacity throughout the left lung. This could represent superimposed edema or pneumonia. Electronically Signed   By: Franki Cabot M.D.   On: 08/31/2015 12:26   Ct Angio Chest Pe W/cm &/or Wo Cm  08/31/2015  CLINICAL DATA:  Short breath, history of pulmonary fibrosis. Chronic systolic heart failure. EXAM: CT ANGIOGRAPHY CHEST WITH CONTRAST TECHNIQUE: Multidetector CT imaging of the chest was  performed using the standard protocol during bolus administration of intravenous contrast. Multiplanar CT image reconstructions and MIPs were obtained to evaluate the vascular anatomy. CONTRAST:  1101m OMNIPAQUE IOHEXOL 350 MG/ML SOLN COMPARISON:  CT thorax 08/15/2014 FINDINGS: Mediastinum/Nodes: There are no filling defects within the pulmonary arteries to suggest acute pulmonary embolism. No acute findings aorta great vessels. No pericardial fluid. Coronary calcifications are present. No axillary supraclavicular adenopathy. No mediastinal adenopathy. Esophagus. Lungs/Pleura: There is architectural distortion in the upper lobes with bronchiectasis. There scattered ground-glass opacities in the upper lobes. There is peripheral reticulation and architectural distortion in the lower lobes with ground-glass opacities. No focal consolidation. Central airways are normal. The findings of architectural distortion and ground-glass opacities are similar to comparison CT of 08/15/2014. Upper abdomen: Large benign hepatic cysts.  Adrenal glands normal. Musculoskeletal: No aggressive osseous lesion. Review of the MIP images confirms the above findings. IMPRESSION: 1. No evidence acute pulmonary embolism. 2. Chronic interstitial lung disease, bronchiectasis, and severe ground-glass opacities not changed from CT of 08/15/2014. Electronically Signed   By: SSuzy BouchardM.D.   On: 08/31/2015 16:58     Medical Consultants:    None.  Anti-Infectives:   Anti-infectives    Start     Dose/Rate Route Frequency Ordered Stop   09/01/15 1000  doxycycline (VIBRAMYCIN) 100 mg in dextrose 5 % 250 mL IVPB     100 mg 125 mL/hr over 120 Minutes Intravenous Every 12 hours 09/01/15 0935     08/31/15 1330  vancomycin (VANCOCIN) IVPB 1000 mg/200 mL premix     1,000 mg 200 mL/hr over 60 Minutes Intravenous  Once 08/31/15 1316 08/31/15 1949   08/31/15 1330  piperacillin-tazobactam (ZOSYN) IVPB 3.375 g     3.375 g 12.5 mL/hr over  240 Minutes Intravenous  Once 08/31/15 1316 08/31/15 1829      Subjective:    [name] she relates she feels better but still weak with ongoing Productive cough.  Objective:    Filed Vitals:   08/31/15 2100 08/31/15 2158 09/01/15 0458 09/01/15 0835  BP:  106/48 147/54   Pulse: 92 86 69   Temp:  98.4 F (36.9 C) 97.6 F (36.4 C)   TempSrc:  Oral Oral   Resp: _0 Height:      Weight:   68.9 kg (151 lb 14.4 oz)   SpO2: 92% 97% 97% 95%    Intake/Output Summary (Last 24 hours) at 09/01/15 0944 Last data filed at 09/01/15 0824  Gross per 24 hour  Intake      0 ml  Output    725 ml  Net   -725 ml   Filed Weights   08/31/15 1856 09/01/15 0458  Weight: 69.1 kg (152 lb 5.4 oz) 68.9 kg (151 lb 14.4 oz)    Exam: Gen:  NAD Cardiovascular:  RRR, No M/R/G Chest and lungs:   CTAB Abdomen:  Abdomen soft, NT/ND, + BS Extremities:  No C/E/C   Data Reviewed:    Labs: Basic Metabolic Panel:  Recent Labs Lab 08/31/15 1232 09/01/15 0510  NA 132*  133*  K 4.3 4.2  CL 97* 99*  CO2 27 28  GLUCOSE 105* 89  BUN 10 12  CREATININE 0.67 0.71  CALCIUM 9.6 9.6   GFR Estimated Creatinine Clearance: 45.6 mL/min (by C-G formula based on Cr of 0.71). Liver Function Tests: No results for input(s): AST, ALT, ALKPHOS, BILITOT, PROT, ALBUMIN in the last 168 hours. No results for input(s): LIPASE, AMYLASE in the last 168 hours. No results for input(s): AMMONIA in the last 168 hours. Coagulation profile No results for input(s): INR, PROTIME in the last 168 hours.  CBC:  Recent Labs Lab 08/31/15 1232 09/01/15 0510  WBC 13.1* 11.0*  NEUTROABS 10.7*  --   HGB 13.1 11.9*  HCT 40.0 36.3  MCV 90.7 91.0  PLT 237 238   Cardiac Enzymes: No results for input(s): CKTOTAL, CKMB, CKMBINDEX, TROPONINI in the last 168 hours. BNP (last 3 results) No results for input(s): PROBNP in the last 8760 hours. CBG: No results for input(s): GLUCAP in the last 168  hours. D-Dimer: No results for input(s): DDIMER in the last 72 hours. Hgb A1c: No results for input(s): HGBA1C in the last 72 hours. Lipid Profile: No results for input(s): CHOL, HDL, LDLCALC, TRIG, CHOLHDL, LDLDIRECT in the last 72 hours. Thyroid function studies: No results for input(s): TSH, T4TOTAL, T3FREE, THYROIDAB in the last 72 hours.  Invalid input(s): FREET3 Anemia work up: No results for input(s): VITAMINB12, FOLATE, FERRITIN, TIBC, IRON, RETICCTPCT in the last 72 hours. Sepsis Labs:  Recent Labs Lab 08/31/15 1232 09/01/15 0510  WBC 13.1* 11.0*   Microbiology No results found for this or any previous visit (from the past 240 hour(s)).   Medications:   . aspirin EC  325 mg Oral Daily  . cholecalciferol  1,000 Units Oral Daily  . doxycycline (VIBRAMYCIN) IV  100 mg Intravenous Q12H  . enoxaparin (LOVENOX) injection  40 mg Subcutaneous Q24H  . furosemide  20 mg Oral Daily  . guaiFENesin  600 mg Oral BID  . ipratropium-albuterol  3 mL Nebulization Q6H  . irbesartan  150 mg Oral Daily  . magnesium oxide  200 mg Oral Daily  . methylPREDNISolone (SOLU-MEDROL) injection  40 mg Intravenous Q12H  . pantoprazole  40 mg Oral Daily  . sertraline  100 mg Oral Daily   Continuous Infusions:   Time spent: 25 min     Charlynne Cousins  [facility] Pager [phone]  *Please refer to [email or web address], password [email or web address] to get updated schedule on who will round on this patient, as hospitalists switch teams weekly. If 7PM-7AM, please contact night-coverage at [email or web address], password [email or web address] for any overnight needs.  09/01/2015, 9:44 AM

## 2015-09-02 DIAGNOSIS — J209 Acute bronchitis, unspecified: Secondary | ICD-10-CM | POA: Insufficient documentation

## 2015-09-02 DIAGNOSIS — J440 Chronic obstructive pulmonary disease with acute lower respiratory infection: Secondary | ICD-10-CM

## 2015-09-02 LAB — BASIC METABOLIC PANEL
Anion gap: 6 (ref 5–15)
BUN: 11 mg/dL (ref 6–20)
CALCIUM: 9.4 mg/dL (ref 8.9–10.3)
CO2: 25 mmol/L (ref 22–32)
CREATININE: 0.54 mg/dL (ref 0.44–1.00)
Chloride: 98 mmol/L — ABNORMAL LOW (ref 101–111)
GFR calc Af Amer: >60 mL/min (ref >60)
GLUCOSE: 134 mg/dL — AB (ref 65–99)
POTASSIUM: 5.3 mmol/L — AB (ref 3.5–5.1)
SODIUM: 129 mmol/L — AB (ref 135–145)

## 2015-09-02 MED ORDER — SODIUM CHLORIDE 0.9 % IV BOLUS (SEPSIS)
500.0000 mL | Freq: Once | INTRAVENOUS | Status: AC
  Administered 2015-09-02: 500 mL via INTRAVENOUS

## 2015-09-02 MED ORDER — ZOLPIDEM TARTRATE 5 MG PO TABS
5.0000 mg | ORAL_TABLET | Freq: Every evening | ORAL | Status: DC | PRN
  Administered 2015-09-02 – 2015-09-03 (×2): 5 mg via ORAL
  Filled 2015-09-02 (×2): qty 1

## 2015-09-02 MED ORDER — SODIUM CHLORIDE 0.9 % IV SOLN
INTRAVENOUS | Status: DC
  Administered 2015-09-02: 18:00:00 via INTRAVENOUS

## 2015-09-02 NOTE — Evaluation (Signed)
Physical Therapy Evaluation Patient Details Name: [name] MRN: [id number] DOB: 09/06/1928 Today's Date: 09/02/2015   History of Present Illness  81 yo female admitted with acute respiratory failure. Hx of pulmonary fibrosis, CHF, HTN. Pt is from home alone.  Clinical Impression  On eval, pt was Min guard assist for mobility-walked ~175 feet while holding onto IV pole, and another 15 feet without support. O2 sats 86% on 3L O2 during ambulation. Seated rest break needed between walks however pt tolerated activity fairly well. If pt were to return home, she would mostly likely need assistance in home outside of HHPT. A home health aide could be very beneficia with assisting with ADLs (and IADLs if possible). Will continue to follow and assess mobility.     Follow Up Recommendations Home health PT;Home Health Aide;Intermittent assist    Equipment Recommendations  None recommended by PT (pt states she access to DME)    Recommendations for Other Services       Precautions / Restrictions Precautions Precautions: Fall Precaution Comments: O2 dep      Mobility  Bed Mobility Overal bed mobility: Modified Independent             General bed mobility comments: HOB elevated  Transfers Overall transfer level: Needs assistance   Transfers: Sit to/from Stand Sit to Stand: Supervision            Ambulation/Gait Ambulation/Gait assistance: Min guard Ambulation Distance (Feet): 175 Feet (175'1, 15'x1) Assistive device: None (IV pole) Gait Pattern/deviations: Step-through pattern;Decreased stride length     General Gait Details: 150 feet while holding onto IV pole and another 15 feet without support. Remained on [place] O2-sats 86% on 3L during ambulation. VCs for pursed lip/deep breathing  Stairs            Wheelchair Mobility    Modified Rankin (Stroke Patients Only)       Balance Overall balance assessment: Needs assistance         Standing balance  support: During functional activity Standing balance-Leahy Scale: Good                               Pertinent Vitals/Pain Pain Assessment: No/denies pain    Home Living Family/patient expects to be discharged to:: Private residence Living Arrangements: Alone Available Help at Discharge: Family;Friend(s);Available PRN/intermittently Type of Home: House Home Access: Stairs to enter Entrance Stairs-Rails: Right Entrance Stairs-Number of Steps: 2 Home Layout: One level Home Equipment: Walker - 2 wheels;Cane - single point;Grab bars - tub/shower;Shower seat;Toilet riser Additional Comments: doesn't use toilet riser or shower seat, one commode is standard, one is handicapped height    Prior Function Level of Independence: Independent         Comments: drives, walks short distances due to SOB, denies h/o falls     Hand Dominance        Extremity/Trunk Assessment   Upper Extremity Assessment: Generalized weakness           Lower Extremity Assessment: Generalized weakness      Cervical / Trunk Assessment: Normal  Communication   Communication: No difficulties  Cognition Arousal/Alertness: Awake/alert Behavior During Therapy: WFL for tasks assessed/performed Overall Cognitive Status: Within Functional Limits for tasks assessed                      General Comments      Exercises        Assessment/Plan  PT Assessment Patient needs continued PT services  PT Diagnosis Difficulty walking;Generalized weakness   PT Problem List Decreased strength;Decreased activity tolerance;Decreased balance;Decreased mobility  PT Treatment Interventions Gait training;Functional mobility training;Therapeutic activities;Patient/family education;Balance training;Therapeutic exercise   PT Goals (Current goals can be found in the Care Plan section) Acute Rehab PT Goals Patient Stated Goal: home PT Goal Formulation: With patient Time For Goal Achievement:  09/16/15    Frequency Min 3X/week   Barriers to discharge        Co-evaluation               End of Session Equipment Utilized During Treatment: Gait belt;Oxygen Activity Tolerance: Patient tolerated treatment well Patient left: in bed;with call bell/phone within reach;with family/visitor present;with bed alarm set           Time: 1411-1430 PT Time Calculation (min) (ACUTE ONLY): 19 min   Charges:   PT Evaluation $Initial PT Evaluation Tier I: 1 Procedure     PT G Codes:        Weston Anna, MPT Pager: [phone]

## 2015-09-02 NOTE — Progress Notes (Signed)
[facility] PROGRESS NOTE    Progress Note   [name] ACZ:[id number] DOB: [birth year] DOA: 08/31/2015 PCP: Binnie Rail, MD   Brief Narrative:   [name] is an 81 y.o. female past medical history of pulmonary fibrosis oxygen dependent and prednisone dependent that comes into the hospital for shortness of breath  Assessment/Plan:  Acute respiratory failure with hypoxia ([place]) due to pulmonary fibrosis and possibly acute bronchitis: Saturations remained greater than 90% on 2 L of oxygen, change her prednisone to Solu-Medrol IV Cont iv doxycycline, as I am concerned of acute bronchitis, she's had a new productive cough for the last several days and generalized weakness that has progressively gotten worse. Patient will like to meet with palliative care.  Chronic Hyponatremia: Her Fractional excretions of sodium was less than 1% which points towards prerenal, will hold Lasix start on IV fluids and recheck a basic metabolic panel in the morning.  Chronic diastolic heart failure: Seems euvolemic,Hold Lasix.  Essential hypertension: Continue current regimen no changes were made.  Leukocytosis: ? Due to infection.    DVT Prophylaxis - Lovenox ordered.  Family Communication: none Disposition Plan: Home when stable. Code Status:     Code Status Orders        Start     Ordered   08/31/15 1920  Do not attempt resuscitation (DNR)   Continuous    Question Answer Comment  In the event of cardiac or respiratory ARREST Do not call a "code blue"   In the event of cardiac or respiratory ARREST Do not perform Intubation, CPR, defibrillation or ACLS   In the event of cardiac or respiratory ARREST Use medication by any route, position, wound care, and other measures to relive pain and suffering. May use oxygen, suction and manual treatment of airway obstruction as needed for comfort.      08/31/15 1919    Advance Directive Documentation        Most Recent Value   Type  of Advance Directive  Healthcare Power of Attorney, Living will   Pre-existing out of facility DNR order (yellow form or pink MOST form)     "MOST" Form in Place?          IV Access:    Peripheral IV   Procedures and diagnostic studies:   Dg Chest 2 View  08/31/2015  CLINICAL DATA:  Shortness of breath. History of pulmonary fibrosis, chronic diastolic heart failure, hypertension, on 2 L home oxygen. On steroids for 1 year. EXAM: CHEST  2 VIEW COMPARISON:  Chest x-rays dated 08/01/2015 and 11/09/2014. FINDINGS: Changes of chronic fibrosis again noted bilaterally, most severe within the right upper lobe and at each lung base. Slightly increased opacity is now seen throughout the left lung. Heart size is upper normal, unchanged. Overall cardiomediastinal silhouette is stable. No pleural effusion identified. No pneumothorax. No acute osseous abnormality. IMPRESSION: Chronic fibrosis bilaterally, most prominent within the right upper lobe and at each lung base. Increased opacity throughout the left lung. This could represent superimposed edema or pneumonia. Electronically Signed   By: Franki Cabot M.D.   On: 08/31/2015 12:26   Ct Angio Chest Pe W/cm &/or Wo Cm  08/31/2015  CLINICAL DATA:  Short breath, history of pulmonary fibrosis. Chronic systolic heart failure. EXAM: CT ANGIOGRAPHY CHEST WITH CONTRAST TECHNIQUE: Multidetector CT imaging of the chest was performed using the standard protocol during bolus administration of intravenous contrast. Multiplanar CT image reconstructions and MIPs were obtained to evaluate the vascular  anatomy. CONTRAST:  121mL OMNIPAQUE IOHEXOL 350 MG/ML SOLN COMPARISON:  CT thorax 08/15/2014 FINDINGS: Mediastinum/Nodes: There are no filling defects within the pulmonary arteries to suggest acute pulmonary embolism. No acute findings aorta great vessels. No pericardial fluid. Coronary calcifications are present. No axillary supraclavicular adenopathy. No mediastinal  adenopathy. Esophagus. Lungs/Pleura: There is architectural distortion in the upper lobes with bronchiectasis. There scattered ground-glass opacities in the upper lobes. There is peripheral reticulation and architectural distortion in the lower lobes with ground-glass opacities. No focal consolidation. Central airways are normal. The findings of architectural distortion and ground-glass opacities are similar to comparison CT of 08/15/2014. Upper abdomen: Large benign hepatic cysts.  Adrenal glands normal. Musculoskeletal: No aggressive osseous lesion. Review of the MIP images confirms the above findings. IMPRESSION: 1. No evidence acute pulmonary embolism. 2. Chronic interstitial lung disease, bronchiectasis, and severe ground-glass opacities not changed from CT of 08/15/2014. Electronically Signed   By: Suzy Bouchard M.D.   On: 08/31/2015 16:58     Medical Consultants:    None.  Anti-Infectives:   Anti-infectives    Start     Dose/Rate Route Frequency Ordered Stop   09/01/15 1000  doxycycline (VIBRAMYCIN) 100 mg in dextrose 5 % 250 mL IVPB     100 mg 125 mL/hr over 120 Minutes Intravenous Every 12 hours 09/01/15 0935     08/31/15 1330  vancomycin (VANCOCIN) IVPB 1000 mg/200 mL premix     1,000 mg 200 mL/hr over 60 Minutes Intravenous  Once 08/31/15 1316 08/31/15 1949   08/31/15 1330  piperacillin-tazobactam (ZOSYN) IVPB 3.375 g     3.375 g 12.5 mL/hr over 240 Minutes Intravenous  Once 08/31/15 1316 08/31/15 1829      Subjective:    [name] she relates she continues to have a productive cough but she feels with more energy.  Objective:    Filed Vitals:   09/01/15 1649 09/01/15 2101 09/01/15 2230 09/02/15 0448  BP:   143/64 149/81  Pulse:   97 102  Temp:   97.5 F (36.4 C) 98 F (36.7 C)  TempSrc:   Oral Oral  Resp:   20 20  Height:      Weight:    70.4 kg (155 lb 3.3 oz)  SpO2: 95% 96% 95% 98%    Intake/Output Summary (Last 24 hours) at 09/02/15 0835 Last  data filed at 09/02/15 0834  Gross per 24 hour  Intake    970 ml  Output    950 ml  Net     20 ml   Filed Weights   08/31/15 1856 09/01/15 0458 09/02/15 0448  Weight: 69.1 kg (152 lb 5.4 oz) 68.9 kg (151 lb 14.4 oz) 70.4 kg (155 lb 3.3 oz)    Exam: Gen:  NAD Cardiovascular:  RRR, No M/R/G Chest and lungs:   Good air movement with diffuse crackles. Abdomen:  Abdomen soft, NT/ND, + BS Extremities:  No C/E/C   Data Reviewed:    Labs: Basic Metabolic Panel:  Recent Labs Lab 08/31/15 1232 09/01/15 0510 09/02/15 0510  NA 132* 133* 129*  K 4.3 4.2 5.3*  CL 97* 99* 98*  CO2 27 28 25   GLUCOSE 105* 89 134*  BUN 10 12 11   CREATININE 0.67 0.71 0.54  CALCIUM 9.6 9.6 9.4   GFR Estimated Creatinine Clearance: 46.1 mL/min (by C-G formula based on Cr of 0.54). Liver Function Tests: No results for input(s): AST, ALT, ALKPHOS, BILITOT, PROT, ALBUMIN in the last 168 hours. No results  for input(s): LIPASE, AMYLASE in the last 168 hours. No results for input(s): AMMONIA in the last 168 hours. Coagulation profile No results for input(s): INR, PROTIME in the last 168 hours.  CBC:  Recent Labs Lab 08/31/15 1232 09/01/15 0510  WBC 13.1* 11.0*  NEUTROABS 10.7*  --   HGB 13.1 11.9*  HCT 40.0 36.3  MCV 90.7 91.0  PLT 237 238   Cardiac Enzymes: No results for input(s): CKTOTAL, CKMB, CKMBINDEX, TROPONINI in the last 168 hours. BNP (last 3 results) No results for input(s): PROBNP in the last 8760 hours. CBG: No results for input(s): GLUCAP in the last 168 hours. D-Dimer: No results for input(s): DDIMER in the last 72 hours. Hgb A1c: No results for input(s): HGBA1C in the last 72 hours. Lipid Profile: No results for input(s): CHOL, HDL, LDLCALC, TRIG, CHOLHDL, LDLDIRECT in the last 72 hours. Thyroid function studies: No results for input(s): TSH, T4TOTAL, T3FREE, THYROIDAB in the last 72 hours.  Invalid input(s): FREET3 Anemia work up: No results for input(s):  VITAMINB12, FOLATE, FERRITIN, TIBC, IRON, RETICCTPCT in the last 72 hours. Sepsis Labs:  Recent Labs Lab 08/31/15 1232 09/01/15 0510  WBC 13.1* 11.0*   Microbiology No results found for this or any previous visit (from the past 240 hour(s)).   Medications:   . aspirin EC  325 mg Oral Daily  . cholecalciferol  1,000 Units Oral Daily  . doxycycline (VIBRAMYCIN) IV  100 mg Intravenous Q12H  . enoxaparin (LOVENOX) injection  40 mg Subcutaneous Q24H  . furosemide  20 mg Oral Daily  . guaiFENesin  600 mg Oral BID  . ipratropium-albuterol  3 mL Nebulization TID  . irbesartan  150 mg Oral Daily  . magnesium oxide  200 mg Oral Daily  . methylPREDNISolone (SOLU-MEDROL) injection  40 mg Intravenous Q12H  . pantoprazole  40 mg Oral Daily  . sertraline  100 mg Oral Daily   Continuous Infusions:   Time spent: 25 min   LOS: 1 day   Charlynne Cousins  [facility] Pager [phone]  *Please refer to [email or web address], password [email or web address] to get updated schedule on who will round on this patient, as hospitalists switch teams weekly. If 7PM-7AM, please contact night-coverage at [email or web address], password [email or web address] for any overnight needs.  09/02/2015, 8:35 AM

## 2015-09-03 DIAGNOSIS — Z66 Do not resuscitate: Secondary | ICD-10-CM

## 2015-09-03 DIAGNOSIS — Z515 Encounter for palliative care: Secondary | ICD-10-CM

## 2015-09-03 LAB — BASIC METABOLIC PANEL
Anion gap: 5 (ref 5–15)
BUN: 12 mg/dL (ref 6–20)
CALCIUM: 9.2 mg/dL (ref 8.9–10.3)
CHLORIDE: 102 mmol/L (ref 101–111)
CO2: 27 mmol/L (ref 22–32)
CREATININE: 0.55 mg/dL (ref 0.44–1.00)
GFR calc non Af Amer: >60 mL/min (ref >60)
Glucose, Bld: 156 mg/dL — ABNORMAL HIGH (ref 65–99)
POTASSIUM: 4.6 mmol/L (ref 3.5–5.1)
Sodium: 134 mmol/L — ABNORMAL LOW (ref 135–145)

## 2015-09-03 MED ORDER — PREDNISONE 50 MG PO TABS
50.0000 mg | ORAL_TABLET | Freq: Every day | ORAL | Status: DC
  Administered 2015-09-03 – 2015-09-04 (×2): 50 mg via ORAL
  Filled 2015-09-03 (×2): qty 1

## 2015-09-03 MED ORDER — DOXYCYCLINE HYCLATE 100 MG PO TABS
100.0000 mg | ORAL_TABLET | Freq: Two times a day (BID) | ORAL | Status: DC
  Administered 2015-09-03 – 2015-09-04 (×3): 100 mg via ORAL
  Filled 2015-09-03 (×3): qty 1

## 2015-09-03 NOTE — Progress Notes (Signed)
[facility] PROGRESS NOTE    Progress Note   [name] TGY:[id number] DOB: 01/31/1928 DOA: 08/31/2015 PCP: Maria Rail, MD   Brief Narrative:   [name] is an 81 y.o. female past medical history of pulmonary fibrosis oxygen dependent and prednisone dependent that comes into the hospital for shortness of breath  Assessment/Plan:  Acute respiratory failure with hypoxia ([place]) due to pulmonary fibrosis and possibly acute bronchitis: Change antibiotics and steroids to oral medication will watch for the next 24 hours hopefully home in the morning Patient will like to meet with palliative care.  Chronic Hyponatremia: Her Fractional excretions of sodium was less than 1% which points towards prerenal, will hold Lasix start on IV fluids and recheck a basic metabolic panel in the morning.  Chronic diastolic heart failure: Seems euvolemic,Hold Lasix.  Essential hypertension: Continue current regimen, hold Lasix.  Leukocytosis: ? Due to infection.    DVT Prophylaxis - Lovenox ordered.  Family Communication: none Disposition Plan: Home when stable. Code Status:     Code Status Orders        Start     Ordered   08/31/15 1920  Do not attempt resuscitation (DNR)   Continuous    Question Answer Comment  In the event of cardiac or respiratory ARREST Do not call a "code blue"   In the event of cardiac or respiratory ARREST Do not perform Intubation, CPR, defibrillation or ACLS   In the event of cardiac or respiratory ARREST Use medication by any route, position, wound care, and other measures to relive pain and suffering. May use oxygen, suction and manual treatment of airway obstruction as needed for comfort.      08/31/15 1919    Advance Directive Documentation        Most Recent Value   Type of Advance Directive  Healthcare Power of Attorney, Living will   Pre-existing out of facility DNR order (yellow form or pink MOST form)     "MOST" Form in Place?           IV Access:    Peripheral IV   Procedures and diagnostic studies:   No results found.   Medical Consultants:    None.  Anti-Infectives:   Anti-infectives    Start     Dose/Rate Route Frequency Ordered Stop   09/01/15 1000  doxycycline (VIBRAMYCIN) 100 mg in dextrose 5 % 250 mL IVPB     100 mg 125 mL/hr over 120 Minutes Intravenous Every 12 hours 09/01/15 0935     08/31/15 1330  vancomycin (VANCOCIN) IVPB 1000 mg/200 mL premix     1,000 mg 200 mL/hr over 60 Minutes Intravenous  Once 08/31/15 1316 08/31/15 1949   08/31/15 1330  piperacillin-tazobactam (ZOSYN) IVPB 3.375 g     3.375 g 12.5 mL/hr over 240 Minutes Intravenous  Once 08/31/15 1316 08/31/15 1829      Subjective:    [name] she relates she feels close to baseline.  Objective:    Filed Vitals:   09/02/15 1330 09/02/15 2026 09/02/15 2048 09/03/15 0456  BP: 136/52  147/60 164/70  Pulse: 83  77 67  Temp: 97.9 F (36.6 C)  97.7 F (36.5 C) 97.6 F (36.4 C)  TempSrc: Oral  Oral Oral  Resp: 22  20 18   Height:      Weight:    71.2 kg (156 lb 15.5 oz)  SpO2: 92% 98% 100% 99%    Intake/Output Summary (Last 24 hours) at 09/03/15  [place] filed at 09/03/15 0746  Gross per 24 hour  Intake 1798.75 ml  Output   3000 ml  Net -1201.25 ml   Filed Weights   09/01/15 0458 09/02/15 0448 09/03/15 0456  Weight: 68.9 kg (151 lb 14.4 oz) 70.4 kg (155 lb 3.3 oz) 71.2 kg (156 lb 15.5 oz)    Exam: Gen:  NAD Cardiovascular:  RRR, No M/R/G Chest and lungs:   Good air movement with diffuse crackles. Abdomen:  Abdomen soft, NT/ND, + BS Extremities:  No C/E/C   Data Reviewed:    Labs: Basic Metabolic Panel:  Recent Labs Lab 08/31/15 1232 09/01/15 0510 09/02/15 0510  NA 132* 133* 129*  K 4.3 4.2 5.3*  CL 97* 99* 98*  CO2 27 28 25   GLUCOSE 105* 89 134*  BUN 10 12 11   CREATININE 0.67 0.71 0.54  CALCIUM 9.6 9.6 9.4   GFR Estimated Creatinine Clearance: 46.4 mL/min (by C-G  formula based on Cr of 0.54). Liver Function Tests: No results for input(s): AST, ALT, ALKPHOS, BILITOT, PROT, ALBUMIN in the last 168 hours. No results for input(s): LIPASE, AMYLASE in the last 168 hours. No results for input(s): AMMONIA in the last 168 hours. Coagulation profile No results for input(s): INR, PROTIME in the last 168 hours.  CBC:  Recent Labs Lab 08/31/15 1232 09/01/15 0510  WBC 13.1* 11.0*  NEUTROABS 10.7*  --   HGB 13.1 11.9*  HCT 40.0 36.3  MCV 90.7 91.0  PLT 237 238   Cardiac Enzymes: No results for input(s): CKTOTAL, CKMB, CKMBINDEX, TROPONINI in the last 168 hours. BNP (last 3 results) No results for input(s): PROBNP in the last 8760 hours. CBG: No results for input(s): GLUCAP in the last 168 hours. D-Dimer: No results for input(s): DDIMER in the last 72 hours. Hgb A1c: No results for input(s): HGBA1C in the last 72 hours. Lipid Profile: No results for input(s): CHOL, HDL, LDLCALC, TRIG, CHOLHDL, LDLDIRECT in the last 72 hours. Thyroid function studies: No results for input(s): TSH, T4TOTAL, T3FREE, THYROIDAB in the last 72 hours.  Invalid input(s): FREET3 Anemia work up: No results for input(s): VITAMINB12, FOLATE, FERRITIN, TIBC, IRON, RETICCTPCT in the last 72 hours. Sepsis Labs:  Recent Labs Lab 08/31/15 1232 09/01/15 0510  WBC 13.1* 11.0*   Microbiology No results found for this or any previous visit (from the past 240 hour(s)).   Medications:   . aspirin EC  325 mg Oral Daily  . cholecalciferol  1,000 Units Oral Daily  . doxycycline (VIBRAMYCIN) IV  100 mg Intravenous Q12H  . enoxaparin (LOVENOX) injection  40 mg Subcutaneous Q24H  . guaiFENesin  600 mg Oral BID  . ipratropium-albuterol  3 mL Nebulization TID  . magnesium oxide  200 mg Oral Daily  . methylPREDNISolone (SOLU-MEDROL) injection  40 mg Intravenous Q12H  . pantoprazole  40 mg Oral Daily  . sertraline  100 mg Oral Daily   Continuous Infusions: . sodium chloride  75 mL/hr at 09/02/15 1829    Time spent: 25 min   LOS: 2 days   Charlynne Cousins  [facility] Pager [phone]  *Please refer to [email or web address], password [email or web address] to get updated schedule on who will round on this patient, as hospitalists switch teams weekly. If 7PM-7AM, please contact night-coverage at [email or web address], password [email or web address] for any overnight needs.  09/03/2015, 7:50 AM

## 2015-09-03 NOTE — Care Management Important Message (Signed)
Important Message  Patient Details  Name: [name] MRN: [id number] Date of Birth: 01-05-1928   Medicare Important Message Given:  Yes-second notification given    Shelda Altes 09/03/2015, 3:26 [place] Message  Patient Details  Name: [name] MRN: [id number] Date of Birth: 1927-11-12   Medicare Important Message Given:  Yes-second notification given    Shelda Altes 09/03/2015, 3:26 PM

## 2015-09-03 NOTE — Consult Note (Signed)
Consultation Note Date: 09/03/2015   Patient Name: [name]  DOB: [birth year]  MRN: [id number]  Age / Sex: 81 y.o., female  PCP: Binnie Rail, MD Referring Physician: Charlynne Cousins, MD  Reason for Consultation: Establishing goals of care and Psychosocial/spiritual support    Clinical Assessment/Narrative:  -81 year old female with past medical history of end stage pulmonary fibrosis on oxygen at home, follows with Dr. Melvyn Novas of [facility], chronic diastolic CHF, anemia, hypertension, depression who presented to [facility] with worsening shortness of breath worse with exertion.   She reports she was having difficulty ambulating due to dyspnea and felt she needed to come to ED. She reports she has had Prednisone tapered down to 20 mg PO QD and she explains that every time this is tapered down she starts feeling worse.   On admission, pt is hemodynamically stable, oxygen sat stable on 3 L (apparently desaturated to 70's with ambulation). [facility] asked to admit for further evaluation.   Slow continued physical and functional decline, fully cognitive,  with little social support, faces anticipatory care needs and natural progression of pulmonary disease.   Contacts/Participants in Discussion:  Patient herself Primary Decision Maker: Patient is of sound mind, she tells me her brother is her main support person   SUMMARY OF RECOMMENDATIONS  Continue medical management of chronic disease to maximize quality of life.  Maximize resources as available.   Home with home health services.  [facility] if eligible  Code Status/Advance Care Planning: DNR    Code Status Orders        Start     Ordered   08/31/15 1920  Do not attempt resuscitation (DNR)   Continuous    Question Answer Comment  In the event of cardiac or respiratory ARREST Do not call a "code blue"   In the event of cardiac or respiratory ARREST Do  not perform Intubation, CPR, defibrillation or ACLS   In the event of cardiac or respiratory ARREST Use medication by any route, position, wound care, and other measures to relive pain and suffering. May use oxygen, suction and manual treatment of airway obstruction as needed for comfort.      08/31/15 1919    Advance Directive Documentation        Most Recent Value   Type of Advance Directive  Healthcare Power of Attorney, Living will   Pre-existing out of facility DNR order (yellow form or pink MOST form)     "MOST" Form in Place?        Other Directives:  Patient has secured HPOA and Advanced Directives  Symptom Management:   Discussed utilization of opioids for dyspnea.  Patient is very stable at this time    Psycho-social/Spiritual:   Support System: Fair, small family, brother only  Additional Recommendations: Education on Hospice  Prognosis: Unable to determine  Discharge Planning: Home with Home Health   Chief Complaint/ Primary Diagnoses: Present on Admission:  . Dyspnea . Chronic diastolic CHF (congestive heart failure) ([place]) . Chronic hyponatremia . Postinflammatory pulmonary fibrosis ([place]) . Pulmonary fibrosis ([place]) . Acute respiratory failure with hypoxia ([place]) . HCAP (healthcare-associated pneumonia)  I have reviewed the medical record, interviewed the patient and family, and examined the patient. The following aspects are pertinent.  Past Medical History  Diagnosis Date  . Dyspnea   . Diverticulosis 2008  . Internal hemorrhoids   . Esophageal stricture   . Arthritis   . Breast cancer ([place]) 1969  . GERD (  gastroesophageal reflux disease)   . Gallstones   . Depression   . HLD (hyperlipidemia)   . HTN (hypertension)   . Obesity   . Colon polyp 2013    TUBULAR ADENOMA (X1  . Pulmonary fibrosis ([place])   . CAD (coronary artery disease)   . Chronic diastolic CHF (congestive heart failure) ([place]) 08/01/2015   Social History   Social History  .  Marital Status: Married    Spouse Name: N/A  . Number of Children: 0  . Years of Education: N/A   Occupational History  . retired    Social History Main Topics  . Smoking status: Never Smoker   . Smokeless tobacco: Never Used  . Alcohol Use: 4.2 oz/week    7 Glasses of wine per week     Comment: ocaasional  . Drug Use: No  . Sexual Activity: Not Asked   Other Topics Concern  . None   Social History Narrative   Family History  Problem Relation Age of Onset  . Heart disease Mother   . Cancer Mother     bladder  . Esophageal cancer Brother   . Prostate cancer Brother   . Heart disease Brother   . Cancer Brother     sarcoma  . Stomach cancer Neg Hx    Scheduled Meds: . aspirin EC  325 mg Oral Daily  . cholecalciferol  1,000 Units Oral Daily  . doxycycline  100 mg Oral Q12H  . enoxaparin (LOVENOX) injection  40 mg Subcutaneous Q24H  . guaiFENesin  600 mg Oral BID  . ipratropium-albuterol  3 mL Nebulization TID  . magnesium oxide  200 mg Oral Daily  . pantoprazole  40 mg Oral Daily  . predniSONE  50 mg Oral Q breakfast  . sertraline  100 mg Oral Daily   Continuous Infusions:  PRN Meds:.benzonatate, fluticasone, ipratropium-albuterol, ondansetron **OR** ondansetron (ZOFRAN) IV, traMADol, zolpidem Medications Prior to Admission:  Prior to Admission medications   Medication Sig Start Date End Date Taking? Authorizing Provider  acetaminophen (TYLENOL 8 HOUR) 650 MG CR tablet Take 1,300 mg by mouth 2 (two) times daily as needed for pain.    Yes Historical Provider, MD  aspirin EC 325 MG EC tablet Take 1 tablet (325 mg total) by mouth daily. 08/20/14  Yes Marton Redwood, MD  benzonatate (TESSALON) 200 MG capsule Take 1 capsule (200 mg total) by mouth 3 (three) times daily as needed for cough. 08/05/15  Yes Robbie Lis, MD  cholecalciferol (VITAMIN D) 1000 UNITS tablet Take 1,000 Units by mouth daily.   Yes Historical Provider, MD  esomeprazole (NEXIUM) 20 MG capsule Take 20  mg by mouth 2 (two) times daily.    Yes Historical Provider, MD  fluticasone (FLONASE) 50 MCG/ACT nasal spray Place 2 sprays into both nostrils once as needed for allergies or rhinitis.   Yes Historical Provider, MD  furosemide (LASIX) 20 MG tablet Take 20 mg by mouth daily.    Yes Historical Provider, MD  guaiFENesin (MUCINEX) 600 MG 12 hr tablet Take 1 tablet (600 mg total) by mouth 2 (two) times daily. 08/20/14  Yes Marton Redwood, MD  ipratropium-albuterol (DUONEB) 0.5-2.5 (3) MG/3ML SOLN Take 3 mLs by nebulization every 6 (six) hours as needed. Patient taking differently: Take 3 mLs by nebulization every 6 (six) hours as needed (sob).  08/05/15  Yes Robbie Lis, MD  Magnesium 250 MG TABS Take by mouth daily.   Yes Historical Provider, MD  MELATONIN PO Take  1 tablet by mouth at bedtime.   Yes Historical Provider, MD  predniSONE (DELTASONE) 20 MG tablet Take 2 tablets (40 mg total) by mouth daily with breakfast. 08/05/15  Yes Robbie Lis, MD  sertraline (ZOLOFT) 100 MG tablet Take 100 mg by mouth daily.    Yes Historical Provider, MD  sodium chloride (OCEAN) 0.65 % SOLN nasal spray Place 1 spray into both nostrils 2 (two) times daily. Patient taking differently: Place 1 spray into both nostrils as needed for congestion.  08/20/14  Yes Marton Redwood, MD  traMADol (ULTRAM) 50 MG tablet Take 50 mg by mouth every 6 (six) hours as needed for moderate pain.    Yes Historical Provider, MD  valsartan (DIOVAN) 320 MG tablet Take 1 tablet by mouth daily. 06/30/15  Yes Historical Provider, MD   Allergies  Allergen Reactions  . Anesthetics, Amide Nausea And Vomiting  . Codeine Nausea And Vomiting  . Darvocet [Propoxyphene N-Acetaminophen] Nausea And Vomiting  . Antihistamines, Loratadine-Type Other (See Comments)    Weird dreams  . Moxifloxacin Anxiety and Other (See Comments)    Nightmares   CBC:    Component Value Date/Time   WBC 11.0* 09/01/2015 0510   HGB 11.9* 09/01/2015 0510   HCT 36.3  09/01/2015 0510   PLT 238 09/01/2015 0510   MCV 91.0 09/01/2015 0510   NEUTROABS 10.7* 08/31/2015 1232   LYMPHSABS 1.0 08/31/2015 1232   MONOABS 1.2* 08/31/2015 1232   EOSABS 0.1 08/31/2015 1232   BASOSABS 0.0 08/31/2015 1232   Comprehensive Metabolic Panel:    Component Value Date/Time   NA 129* 09/02/2015 0510   K 5.3* 09/02/2015 0510   CL 98* 09/02/2015 0510   CO2 25 09/02/2015 0510   BUN 11 09/02/2015 0510   CREATININE 0.54 09/02/2015 0510   CREATININE 0.80 09/17/2014 1421   GLUCOSE 134* 09/02/2015 0510   CALCIUM 9.4 09/02/2015 0510   AST 47* 08/01/2015 0745   ALT 45 08/01/2015 0745   ALKPHOS 149* 08/01/2015 0745   BILITOT 1.1 08/01/2015 0745   PROT 6.9 08/01/2015 0745   ALBUMIN 3.3* 08/01/2015 0745    Review of Systems  Constitutional: Positive for activity change and fatigue.  HENT: Negative.   Eyes: Negative.   Respiratory: Positive for shortness of breath.   Cardiovascular: Positive for leg swelling.  Endocrine: Negative.   Genitourinary: Negative.   Musculoskeletal: Negative.   Neurological: Negative.   Hematological: Negative.     Physical Exam  Constitutional: She is oriented to person, place, and time. She appears well-developed and well-nourished.  HENT:  Head: Normocephalic and atraumatic.  Eyes: Pupils are equal, round, and reactive to light.  Neck: Normal range of motion.  Cardiovascular: Normal rate, regular rhythm and normal heart sounds.   Musculoskeletal: Normal range of motion.  Neurological: She is alert and oriented to person, place, and time.  Skin: Skin is warm and dry.    Vital Signs: BP 164/70 mmHg  Pulse 67  Temp(Src) 97.6 F (36.4 C) (Oral)  Resp 18  Ht 5\' 1"  (1.549 m)  Wt 71.2 kg (156 lb 15.5 oz)  BMI 29.67 kg/m2  SpO2 96% SpO2: Last BM Date: 08/31/15  O2 Device:SpO2: 96 % O2 Flow Rate: .O2 Flow Rate (L/min): 2 L/min Intake/output summary:  Intake/Output Summary (Last 24 hours) at 09/03/15 1013 Last data filed at  09/03/15 0940  Gross per 24 hour  Intake 2208.75 ml  Output   3025 ml  Net -816.25 ml   LBM:  BMP Latest  Ref Rng 09/02/2015 09/01/2015 08/31/2015  Glucose 65 - 99 mg/dL 134(H) 89 105(H)  BUN 6 - 20 mg/dL 11 12 10   Creatinine 0.44 - 1.00 mg/dL 0.54 0.71 0.67  Sodium 135 - 145 mmol/L 129(L) 133(L) 132(L)  Potassium 3.5 - 5.1 mmol/L 5.3(H) 4.2 4.3  Chloride 101 - 111 mmol/L 98(L) 99(L) 97(L)  CO2 22 - 32 mmol/L 25 28 27   Calcium 8.9 - 10.3 mg/dL 9.4 9.6 9.6    Baseline Weight: Weight: 69.1 kg (152 lb 5.4 oz) Most recent weight: Weight: 71.2 kg (156 lb 15.5 oz)      Palliative Assessment/Data:    Additional Data Reviewed: Recent Labs     08/31/15  1232  09/01/15  0510  09/02/15  0510  WBC  13.1*  11.0*   --   HGB  13.1  11.9*   --   PLT  237  238   --   NA  132*  133*  129*  BUN  10  12  11   CREATININE  0.67  0.71  0.54    Time In: 1000 Time Out: 1115 Time Total: 75 min Greater than 50%  of this time was spent counseling and coordinating care related to the above assessment and plan.  Signed by: Wadie Lessen, NP  Knox Royalty, NP  09/03/2015, 10:13 AM  Please contact Palliative Medicine Team phone at [phone] for questions and concerns.

## 2015-09-04 DIAGNOSIS — J440 Chronic obstructive pulmonary disease with acute lower respiratory infection: Secondary | ICD-10-CM

## 2015-09-04 MED ORDER — DOXYCYCLINE HYCLATE 100 MG PO TABS: 100.0000 mg | ORAL_TABLET | Freq: Two times a day (BID) | ORAL | Status: DC

## 2015-09-04 MED ORDER — BENZONATATE 200 MG PO CAPS: 200.0000 mg | ORAL_CAPSULE | Freq: Three times a day (TID) | ORAL | Status: DC | PRN

## 2015-09-04 MED ORDER — PREDNISONE 10 MG PO TABS: ORAL_TABLET | ORAL | Status: DC

## 2015-09-04 NOTE — Discharge Summary (Signed)
Physician Discharge Summary  [name] OKH:[id number] DOB: May 08, 1928 DOA: 08/31/2015  PCP: Binnie Rail, MD  Admit date: 08/31/2015 Discharge date: 09/04/2015  Time spent: 35 minutes  Recommendations for Outpatient Follow-up:  1. Follow-up with Dr. Melvyn Novas as an outpatient at a time she sees Dr. Shyrl Numbers she should be on 20 mg of steroids. 2. She will go home with home health aide and PT. 3. She will go home 2 L of oxygen nasal cannula daily until cc her pulmonologist  Discharge Diagnoses:  Principal Problem:   Acute respiratory failure with hypoxia ([place]) Active Problems:   Postinflammatory pulmonary fibrosis (HCC)   Pulmonary fibrosis (HCC)   Chronic diastolic CHF (congestive heart failure) (HCC)   Chronic hyponatremia   Dyspnea   Hypoxia   Pain   HCAP (healthcare-associated pneumonia)   Bronchitis, chronic obstructive w acute bronchitis (HCC)   Acute bronchitis   Palliative care encounter   DNR (do not resuscitate)   Discharge Condition: stable  Diet recommendation: regular  Filed Weights   09/02/15 0448 09/03/15 0456 09/04/15 0500  Weight: 70.4 kg (155 lb 3.3 oz) 71.2 kg (156 lb 15.5 oz) 70.7 kg (155 lb 13.8 oz)    History of present illness:  81 year old with past medical history of end-stage pulmonary fibrosis who came into the hospital with difficulty ambulating due to progressive shortness of breath. She had been tapering her steroids, she also relate progressively productive cough.  Hospital Course:  Acute respiratory failure with hypoxia on chronic home O2 to the pulmonary fibrosis and possibly new acute bronchitis/leukocytosis: She was started on IV steroids and empiric antibiotics, she continued have a productive cough. After several days of IV steroids and antibiotic shortness of breath started to improve. Physical therapy evaluated her and recommended no home health, she was changed to oral antibiotic and prednisone. She will continue steroid taper slowly  for 2 weeks until she sees her primary doctor. She also had a meeting with palliative care and she was made do not DNR/DNI, And we'll continue to follow as an outpatient  Chronic hyponatremia: Partial excretion of sodium was less than 1, her Lasix was held she was started on IV fluids, with improvement in her hyponatremia.  Chronic diastolic heart failure: She has been euvolemic recent Lasix as an outpatient.  Essential hypertension: No changes were made to her medication.   Procedures:  Chest x-ray  CT imaging of the chest  Consultations:  none  Discharge Exam: Filed Vitals:   09/04/15 0500  BP: 150/63  Pulse: 76  Temp: 97.9 F (36.6 C)  Resp: 20    General: A&O x3 Cardiovascular: RRR Respiratory: **Good air movement diffuse rhonchi  Discharge Instructions   Discharge Instructions    Diet - low sodium heart healthy    Complete by:  As directed      Increase activity slowly    Complete by:  As directed           Current Discharge Medication List    START taking these medications   Details  doxycycline (VIBRA-TABS) 100 MG tablet Take 1 tablet (100 mg total) by mouth every 12 (twelve) hours. Qty: 10 tablet, Refills: 0      CONTINUE these medications which have CHANGED   Details  predniSONE (DELTASONE) 10 MG tablet Takes 6 tablets for 2 days, then 5 tablets for 2 days, then 4 tablets for 2 days, then 3 tablets for 2 days, then 2 tabs daily Qty: 42 tablet, Refills: 0  CONTINUE these medications which have NOT CHANGED   Details  aspirin EC 325 MG EC tablet Take 1 tablet (325 mg total) by mouth daily. Qty: 30 tablet, Refills: 0    benzonatate (TESSALON) 200 MG capsule Take 1 capsule (200 mg total) by mouth 3 (three) times daily as needed for cough. Qty: 20 capsule, Refills: 0    cholecalciferol (VITAMIN D) 1000 UNITS tablet Take 1,000 Units by mouth daily.    esomeprazole (NEXIUM) 20 MG capsule Take 20 mg by mouth 2 (two) times daily.      fluticasone (FLONASE) 50 MCG/ACT nasal spray Place 2 sprays into both nostrils once as needed for allergies or rhinitis.    furosemide (LASIX) 20 MG tablet Take 20 mg by mouth daily.     ipratropium-albuterol (DUONEB) 0.5-2.5 (3) MG/3ML SOLN Take 3 mLs by nebulization every 6 (six) hours as needed. Qty: 360 mL, Refills: 0    Magnesium 250 MG TABS Take by mouth daily.    MELATONIN PO Take 1 tablet by mouth at bedtime.    sertraline (ZOLOFT) 100 MG tablet Take 100 mg by mouth daily.     sodium chloride (OCEAN) 0.65 % SOLN nasal spray Place 1 spray into both nostrils 2 (two) times daily. Refills: 0    traMADol (ULTRAM) 50 MG tablet Take 50 mg by mouth every 6 (six) hours as needed for moderate pain.     valsartan (DIOVAN) 320 MG tablet Take 1 tablet by mouth daily.      STOP taking these medications     acetaminophen (TYLENOL 8 HOUR) 650 MG CR tablet      guaiFENesin (MUCINEX) 600 MG 12 hr tablet        Allergies  Allergen Reactions  . Anesthetics, Amide Nausea And Vomiting  . Codeine Nausea And Vomiting  . Darvocet [Propoxyphene N-Acetaminophen] Nausea And Vomiting  . Antihistamines, Loratadine-Type Other (See Comments)    Weird dreams  . Moxifloxacin Anxiety and Other (See Comments)    Nightmares   Follow-up Information    Follow up with Binnie Rail, MD In 2 weeks.   Specialty:  Internal Medicine   Why:  hospital follow up   Contact information:   [place] [address] [phone]        The results of significant diagnostics from this hospitalization (including imaging, microbiology, ancillary and laboratory) are listed below for reference.    Significant Diagnostic Studies: Dg Chest 2 View  08/31/2015  CLINICAL DATA:  Shortness of breath. History of pulmonary fibrosis, chronic diastolic heart failure, hypertension, on 2 L home oxygen. On steroids for 1 year. EXAM: CHEST  2 VIEW COMPARISON:  Chest x-rays dated 08/01/2015 and 11/09/2014.  FINDINGS: Changes of chronic fibrosis again noted bilaterally, most severe within the right upper lobe and at each lung base. Slightly increased opacity is now seen throughout the left lung. Heart size is upper normal, unchanged. Overall cardiomediastinal silhouette is stable. No pleural effusion identified. No pneumothorax. No acute osseous abnormality. IMPRESSION: Chronic fibrosis bilaterally, most prominent within the right upper lobe and at each lung base. Increased opacity throughout the left lung. This could represent superimposed edema or pneumonia. Electronically Signed   By: Franki Cabot M.D.   On: 08/31/2015 12:26   Ct Angio Chest Pe W/cm &/or Wo Cm  08/31/2015  CLINICAL DATA:  Short breath, history of pulmonary fibrosis. Chronic systolic heart failure. EXAM: CT ANGIOGRAPHY CHEST WITH CONTRAST TECHNIQUE: Multidetector CT imaging of the chest was performed  using the standard protocol during bolus administration of intravenous contrast. Multiplanar CT image reconstructions and MIPs were obtained to evaluate the vascular anatomy. CONTRAST:  135mL OMNIPAQUE IOHEXOL 350 MG/ML SOLN COMPARISON:  CT thorax 08/15/2014 FINDINGS: Mediastinum/Nodes: There are no filling defects within the pulmonary arteries to suggest acute pulmonary embolism. No acute findings aorta great vessels. No pericardial fluid. Coronary calcifications are present. No axillary supraclavicular adenopathy. No mediastinal adenopathy. Esophagus. Lungs/Pleura: There is architectural distortion in the upper lobes with bronchiectasis. There scattered ground-glass opacities in the upper lobes. There is peripheral reticulation and architectural distortion in the lower lobes with ground-glass opacities. No focal consolidation. Central airways are normal. The findings of architectural distortion and ground-glass opacities are similar to comparison CT of 08/15/2014. Upper abdomen: Large benign hepatic cysts.  Adrenal glands normal. Musculoskeletal:  No aggressive osseous lesion. Review of the MIP images confirms the above findings. IMPRESSION: 1. No evidence acute pulmonary embolism. 2. Chronic interstitial lung disease, bronchiectasis, and severe ground-glass opacities not changed from CT of 08/15/2014. Electronically Signed   By: Suzy Bouchard M.D.   On: 08/31/2015 16:58    Microbiology: No results found for this or any previous visit (from the past 240 hour(s)).   Labs: Basic Metabolic Panel:  Recent Labs Lab 08/31/15 1232 09/01/15 0510 09/02/15 0510 09/03/15 1025  NA 132* 133* 129* 134*  K 4.3 4.2 5.3* 4.6  CL 97* 99* 98* 102  CO2 27 28 25 27   GLUCOSE 105* 89 134* 156*  BUN 10 12 11 12   CREATININE 0.67 0.71 0.54 0.55  CALCIUM 9.6 9.6 9.4 9.2   Liver Function Tests: No results for input(s): AST, ALT, ALKPHOS, BILITOT, PROT, ALBUMIN in the last 168 hours. No results for input(s): LIPASE, AMYLASE in the last 168 hours. No results for input(s): AMMONIA in the last 168 hours. CBC:  Recent Labs Lab 08/31/15 1232 09/01/15 0510  WBC 13.1* 11.0*  NEUTROABS 10.7*  --   HGB 13.1 11.9*  HCT 40.0 36.3  MCV 90.7 91.0  PLT 237 238   Cardiac Enzymes: No results for input(s): CKTOTAL, CKMB, CKMBINDEX, TROPONINI in the last 168 hours. BNP: BNP (last 3 results)  Recent Labs  08/01/15 0745 08/03/15 0600 08/31/15 1232  BNP 250.1* 278.6* 358.8*    ProBNP (last 3 results) No results for input(s): PROBNP in the last 8760 hours.  CBG: No results for input(s): GLUCAP in the last 168 hours.     Signed:  Charlynne Cousins  [facility] 09/04/2015, 9:32 AM

## 2015-09-05 ENCOUNTER — Emergency Department (HOSPITAL_COMMUNITY): Payer: Medicare Other

## 2015-09-05 ENCOUNTER — Inpatient Hospital Stay (HOSPITAL_COMMUNITY)
Admission: EM | Admit: 2015-09-05 | Discharge: 2015-09-11 | DRG: 871 | Disposition: A | Discharge status: Skilled Nursing Facility | Payer: Medicare Other | Attending: Internal Medicine | Admitting: Internal Medicine

## 2015-09-05 ENCOUNTER — Encounter (HOSPITAL_COMMUNITY): Payer: Self-pay | Admitting: *Deleted

## 2015-09-05 DIAGNOSIS — A419 Sepsis, unspecified organism: Principal | ICD-10-CM | POA: Diagnosis present

## 2015-09-05 DIAGNOSIS — Y95 Nosocomial condition: Secondary | ICD-10-CM | POA: Diagnosis present

## 2015-09-05 DIAGNOSIS — J189 Pneumonia, unspecified organism: Secondary | ICD-10-CM | POA: Diagnosis present

## 2015-09-05 DIAGNOSIS — E871 Hypo-osmolality and hyponatremia: Secondary | ICD-10-CM | POA: Diagnosis present

## 2015-09-05 DIAGNOSIS — F329 Major depressive disorder, single episode, unspecified: Secondary | ICD-10-CM | POA: Diagnosis present

## 2015-09-05 DIAGNOSIS — Z79899 Other long term (current) drug therapy: Secondary | ICD-10-CM | POA: Diagnosis not present

## 2015-09-05 DIAGNOSIS — M1990 Unspecified osteoarthritis, unspecified site: Secondary | ICD-10-CM | POA: Diagnosis present

## 2015-09-05 DIAGNOSIS — Z9981 Dependence on supplemental oxygen: Secondary | ICD-10-CM

## 2015-09-05 DIAGNOSIS — I2510 Atherosclerotic heart disease of native coronary artery without angina pectoris: Secondary | ICD-10-CM | POA: Diagnosis present

## 2015-09-05 DIAGNOSIS — K219 Gastro-esophageal reflux disease without esophagitis: Secondary | ICD-10-CM | POA: Diagnosis present

## 2015-09-05 DIAGNOSIS — Z86010 Personal history of colonic polyps: Secondary | ICD-10-CM

## 2015-09-05 DIAGNOSIS — E785 Hyperlipidemia, unspecified: Secondary | ICD-10-CM | POA: Diagnosis present

## 2015-09-05 DIAGNOSIS — R0602 Shortness of breath: Secondary | ICD-10-CM | POA: Diagnosis not present

## 2015-09-05 DIAGNOSIS — I959 Hypotension, unspecified: Secondary | ICD-10-CM | POA: Insufficient documentation

## 2015-09-05 DIAGNOSIS — R069 Unspecified abnormalities of breathing: Secondary | ICD-10-CM | POA: Diagnosis not present

## 2015-09-05 DIAGNOSIS — J9601 Acute respiratory failure with hypoxia: Secondary | ICD-10-CM | POA: Diagnosis not present

## 2015-09-05 DIAGNOSIS — J9621 Acute and chronic respiratory failure with hypoxia: Secondary | ICD-10-CM | POA: Diagnosis present

## 2015-09-05 DIAGNOSIS — J690 Pneumonitis due to inhalation of food and vomit: Secondary | ICD-10-CM | POA: Diagnosis not present

## 2015-09-05 DIAGNOSIS — E669 Obesity, unspecified: Secondary | ICD-10-CM | POA: Diagnosis present

## 2015-09-05 DIAGNOSIS — Z853 Personal history of malignant neoplasm of breast: Secondary | ICD-10-CM

## 2015-09-05 DIAGNOSIS — J208 Acute bronchitis due to other specified organisms: Secondary | ICD-10-CM | POA: Diagnosis not present

## 2015-09-05 DIAGNOSIS — I10 Essential (primary) hypertension: Secondary | ICD-10-CM | POA: Diagnosis not present

## 2015-09-05 DIAGNOSIS — I110 Hypertensive heart disease with heart failure: Secondary | ICD-10-CM | POA: Diagnosis present

## 2015-09-05 DIAGNOSIS — K222 Esophageal obstruction: Secondary | ICD-10-CM | POA: Diagnosis present

## 2015-09-05 DIAGNOSIS — Z884 Allergy status to anesthetic agent status: Secondary | ICD-10-CM | POA: Diagnosis not present

## 2015-09-05 DIAGNOSIS — Z881 Allergy status to other antibiotic agents status: Secondary | ICD-10-CM | POA: Diagnosis not present

## 2015-09-05 DIAGNOSIS — R627 Adult failure to thrive: Secondary | ICD-10-CM | POA: Diagnosis present

## 2015-09-05 DIAGNOSIS — K449 Diaphragmatic hernia without obstruction or gangrene: Secondary | ICD-10-CM | POA: Diagnosis not present

## 2015-09-05 DIAGNOSIS — R1310 Dysphagia, unspecified: Secondary | ICD-10-CM | POA: Insufficient documentation

## 2015-09-05 DIAGNOSIS — Z79891 Long term (current) use of opiate analgesic: Secondary | ICD-10-CM

## 2015-09-05 DIAGNOSIS — Z7952 Long term (current) use of systemic steroids: Secondary | ICD-10-CM | POA: Diagnosis not present

## 2015-09-05 DIAGNOSIS — I5032 Chronic diastolic (congestive) heart failure: Secondary | ICD-10-CM | POA: Diagnosis present

## 2015-09-05 DIAGNOSIS — T380X5A Adverse effect of glucocorticoids and synthetic analogues, initial encounter: Secondary | ICD-10-CM | POA: Diagnosis present

## 2015-09-05 DIAGNOSIS — Z800 Family history of malignant neoplasm of digestive organs: Secondary | ICD-10-CM | POA: Diagnosis not present

## 2015-09-05 DIAGNOSIS — I5043 Acute on chronic combined systolic (congestive) and diastolic (congestive) heart failure: Secondary | ICD-10-CM | POA: Diagnosis present

## 2015-09-05 DIAGNOSIS — J470 Bronchiectasis with acute lower respiratory infection: Secondary | ICD-10-CM | POA: Diagnosis present

## 2015-09-05 DIAGNOSIS — Z8249 Family history of ischemic heart disease and other diseases of the circulatory system: Secondary | ICD-10-CM

## 2015-09-05 DIAGNOSIS — Z7982 Long term (current) use of aspirin: Secondary | ICD-10-CM | POA: Diagnosis not present

## 2015-09-05 DIAGNOSIS — Z885 Allergy status to narcotic agent status: Secondary | ICD-10-CM

## 2015-09-05 DIAGNOSIS — J849 Interstitial pulmonary disease, unspecified: Secondary | ICD-10-CM | POA: Diagnosis not present

## 2015-09-05 DIAGNOSIS — I9589 Other hypotension: Secondary | ICD-10-CM | POA: Diagnosis not present

## 2015-09-05 DIAGNOSIS — Z888 Allergy status to other drugs, medicaments and biological substances status: Secondary | ICD-10-CM | POA: Diagnosis not present

## 2015-09-05 DIAGNOSIS — R509 Fever, unspecified: Secondary | ICD-10-CM | POA: Diagnosis not present

## 2015-09-05 DIAGNOSIS — Z6828 Body mass index (BMI) 28.0-28.9, adult: Secondary | ICD-10-CM

## 2015-09-05 DIAGNOSIS — J8410 Pulmonary fibrosis, unspecified: Secondary | ICD-10-CM | POA: Diagnosis present

## 2015-09-05 DIAGNOSIS — J9600 Acute respiratory failure, unspecified whether with hypoxia or hypercapnia: Secondary | ICD-10-CM | POA: Diagnosis not present

## 2015-09-05 DIAGNOSIS — J209 Acute bronchitis, unspecified: Secondary | ICD-10-CM | POA: Diagnosis present

## 2015-09-05 LAB — CBC WITH DIFFERENTIAL/PLATELET
BASOS ABS: 0 10*3/uL (ref 0.0–0.1)
Basophils Relative: 0 %
Eosinophils Absolute: 0.1 10*3/uL (ref 0.0–0.7)
Eosinophils Relative: 1 %
HEMATOCRIT: 38 % (ref 36.0–46.0)
Hemoglobin: 12.1 g/dL (ref 12.0–15.0)
LYMPHS PCT: 8 %
Lymphs Abs: 1 10*3/uL (ref 0.7–4.0)
MCH: 28.9 pg (ref 26.0–34.0)
MCHC: 31.8 g/dL (ref 30.0–36.0)
MCV: 90.9 fL (ref 78.0–100.0)
Monocytes Absolute: 1.2 10*3/uL — ABNORMAL HIGH (ref 0.1–1.0)
Monocytes Relative: 9 %
NEUTROS ABS: 11 10*3/uL — AB (ref 1.7–7.7)
Neutrophils Relative %: 82 %
PLATELETS: 304 10*3/uL (ref 150–400)
RBC: 4.18 MIL/uL (ref 3.87–5.11)
RDW: 15.1 % (ref 11.5–15.5)
WBC: 13.2 10*3/uL — AB (ref 4.0–10.5)

## 2015-09-05 LAB — CBC
HEMATOCRIT: 34.3 % — AB (ref 36.0–46.0)
Hemoglobin: 11.1 g/dL — ABNORMAL LOW (ref 12.0–15.0)
MCH: 29 pg (ref 26.0–34.0)
MCHC: 32.4 g/dL (ref 30.0–36.0)
MCV: 89.6 fL (ref 78.0–100.0)
Platelets: 290 10*3/uL (ref 150–400)
RBC: 3.83 MIL/uL — ABNORMAL LOW (ref 3.87–5.11)
RDW: 15.1 % (ref 11.5–15.5)
WBC: 12.1 10*3/uL — ABNORMAL HIGH (ref 4.0–10.5)

## 2015-09-05 LAB — URINALYSIS, ROUTINE W REFLEX MICROSCOPIC
Bilirubin Urine: NEGATIVE
GLUCOSE, UA: NEGATIVE mg/dL
Hgb urine dipstick: NEGATIVE
KETONES UR: NEGATIVE mg/dL
LEUKOCYTES UA: NEGATIVE
Nitrite: NEGATIVE
PH: 7 (ref 5.0–8.0)
Protein, ur: NEGATIVE mg/dL
SPECIFIC GRAVITY, URINE: 1.014 (ref 1.005–1.030)
Urobilinogen, UA: 0.2 mg/dL (ref 0.0–1.0)

## 2015-09-05 LAB — COMPREHENSIVE METABOLIC PANEL
ALT: 32 U/L (ref 14–54)
ANION GAP: 6 (ref 5–15)
AST: 29 U/L (ref 15–41)
Albumin: 2.9 g/dL — ABNORMAL LOW (ref 3.5–5.0)
Alkaline Phosphatase: 73 U/L (ref 38–126)
BILIRUBIN TOTAL: 0.8 mg/dL (ref 0.3–1.2)
BUN: 16 mg/dL (ref 6–20)
CO2: 28 mmol/L (ref 22–32)
Calcium: 9.3 mg/dL (ref 8.9–10.3)
Chloride: 99 mmol/L — ABNORMAL LOW (ref 101–111)
Creatinine, Ser: 0.68 mg/dL (ref 0.44–1.00)
Glucose, Bld: 111 mg/dL — ABNORMAL HIGH (ref 65–99)
POTASSIUM: 4.1 mmol/L (ref 3.5–5.1)
Sodium: 133 mmol/L — ABNORMAL LOW (ref 135–145)
TOTAL PROTEIN: 6.3 g/dL — AB (ref 6.5–8.1)

## 2015-09-05 LAB — CREATININE, SERUM
Creatinine, Ser: 0.67 mg/dL (ref 0.44–1.00)
GFR calc Af Amer: >60 mL/min (ref >60)
GFR calc non Af Amer: >60 mL/min (ref >60)

## 2015-09-05 LAB — PROTIME-INR
INR: 1.14 (ref 0.00–1.49)
PROTHROMBIN TIME: 14.8 s (ref 11.6–15.2)

## 2015-09-05 LAB — I-STAT CG4 LACTIC ACID, ED: Lactic Acid, Venous: 1.98 mmol/L (ref 0.5–2.0)

## 2015-09-05 LAB — I-STAT TROPONIN, ED: Troponin i, poc: 0.02 ng/mL (ref 0.00–0.08)

## 2015-09-05 LAB — APTT: aPTT: 29 seconds (ref 24–37)

## 2015-09-05 LAB — SEDIMENTATION RATE: Sed Rate: 65 mm/hr — ABNORMAL HIGH (ref 0–22)

## 2015-09-05 MED ORDER — VANCOMYCIN HCL 500 MG IV SOLR
500.0000 mg | Freq: Two times a day (BID) | INTRAVENOUS | Status: DC
  Administered 2015-09-05 – 2015-09-07 (×5): 500 mg via INTRAVENOUS
  Filled 2015-09-05 (×8): qty 500

## 2015-09-05 MED ORDER — SODIUM CHLORIDE 0.9 % IV BOLUS (SEPSIS)
1000.0000 mL | Freq: Once | INTRAVENOUS | Status: AC
  Administered 2015-09-05: 1000 mL via INTRAVENOUS

## 2015-09-05 MED ORDER — SERTRALINE HCL 100 MG PO TABS
100.0000 mg | ORAL_TABLET | Freq: Every day | ORAL | Status: DC
  Administered 2015-09-06 – 2015-09-11 (×6): 100 mg via ORAL
  Filled 2015-09-05 (×6): qty 1

## 2015-09-05 MED ORDER — VANCOMYCIN HCL IN DEXTROSE 1-5 GM/200ML-% IV SOLN: 1000.0000 mg | Freq: Once | INTRAVENOUS | Status: DC

## 2015-09-05 MED ORDER — ACETAMINOPHEN ER 650 MG PO TBCR: 1300.0000 mg | EXTENDED_RELEASE_TABLET | Freq: Three times a day (TID) | ORAL | Status: DC | PRN

## 2015-09-05 MED ORDER — POLYETHYLENE GLYCOL 3350 17 G PO PACK: 17.0000 g | PACK | Freq: Every day | ORAL | Status: DC | PRN

## 2015-09-05 MED ORDER — PIPERACILLIN-TAZOBACTAM 3.375 G IVPB
3.3750 g | Freq: Three times a day (TID) | INTRAVENOUS | Status: DC
Start: 2015-09-05 — End: 2015-09-09
  Administered 2015-09-05 – 2015-09-09 (×11): 3.375 g via INTRAVENOUS
  Filled 2015-09-05 (×12): qty 50

## 2015-09-05 MED ORDER — TRAMADOL HCL 50 MG PO TABS: 50.0000 mg | ORAL_TABLET | Freq: Four times a day (QID) | ORAL | Status: DC | PRN

## 2015-09-05 MED ORDER — PIPERACILLIN-TAZOBACTAM 3.375 G IVPB
3.3750 g | Freq: Three times a day (TID) | INTRAVENOUS | Status: DC
  Filled 2015-09-05: qty 50

## 2015-09-05 MED ORDER — PREDNISONE 50 MG PO TABS
50.0000 mg | ORAL_TABLET | Freq: Every day | ORAL | Status: DC
  Administered 2015-09-06 – 2015-09-08 (×3): 50 mg via ORAL
  Filled 2015-09-05 (×3): qty 1

## 2015-09-05 MED ORDER — HEPARIN SODIUM (PORCINE) 5000 UNIT/ML IJ SOLN
5000.0000 [IU] | Freq: Three times a day (TID) | INTRAMUSCULAR | Status: DC
Start: 2015-09-05 — End: 2015-09-11
  Administered 2015-09-05 – 2015-09-11 (×17): 5000 [IU] via SUBCUTANEOUS
  Filled 2015-09-05 (×17): qty 1

## 2015-09-05 MED ORDER — SODIUM CHLORIDE 0.9 % IJ SOLN
10.0000 mL | Freq: Two times a day (BID) | INTRAMUSCULAR | Status: DC
  Administered 2015-09-05 – 2015-09-11 (×12): 10 mL

## 2015-09-05 MED ORDER — IPRATROPIUM-ALBUTEROL 0.5-2.5 (3) MG/3ML IN SOLN: 3.0000 mL | RESPIRATORY_TRACT | Status: DC | PRN

## 2015-09-05 MED ORDER — BENZONATATE 100 MG PO CAPS
200.0000 mg | ORAL_CAPSULE | Freq: Three times a day (TID) | ORAL | Status: DC | PRN
  Administered 2015-09-08 – 2015-09-10 (×3): 200 mg via ORAL
  Filled 2015-09-05 (×3): qty 2

## 2015-09-05 MED ORDER — VANCOMYCIN HCL IN DEXTROSE 1-5 GM/200ML-% IV SOLN
1000.0000 mg | Freq: Once | INTRAVENOUS | Status: AC
Start: 2015-09-05 — End: 2015-09-05
  Administered 2015-09-05: 1000 mg via INTRAVENOUS
  Filled 2015-09-05: qty 200

## 2015-09-05 MED ORDER — SODIUM CHLORIDE 0.9 % IJ SOLN
10.0000 mL | INTRAMUSCULAR | Status: DC | PRN
  Administered 2015-09-10: 10 mL
  Filled 2015-09-05: qty 40

## 2015-09-05 MED ORDER — SACCHAROMYCES BOULARDII 250 MG PO CAPS
250.0000 mg | ORAL_CAPSULE | Freq: Two times a day (BID) | ORAL | Status: DC
  Administered 2015-09-05 – 2015-09-11 (×12): 250 mg via ORAL
  Filled 2015-09-05 (×12): qty 1

## 2015-09-05 MED ORDER — ACETAMINOPHEN 500 MG PO TABS: 1000.0000 mg | ORAL_TABLET | Freq: Four times a day (QID) | ORAL | Status: DC | PRN

## 2015-09-05 MED ORDER — SODIUM CHLORIDE 0.9 % IV BOLUS (SEPSIS): 1000.0000 mL | Freq: Once | INTRAVENOUS | Status: DC

## 2015-09-05 MED ORDER — PIPERACILLIN-TAZOBACTAM 3.375 G IVPB 30 MIN: 3.3750 g | Freq: Three times a day (TID) | INTRAVENOUS | Status: DC

## 2015-09-05 MED ORDER — MAGNESIUM 200 MG PO TABS
200.0000 mg | ORAL_TABLET | Freq: Every day | ORAL | Status: DC
  Administered 2015-09-05 – 2015-09-10 (×6): 200 mg via ORAL
  Filled 2015-09-05 (×12): qty 1

## 2015-09-05 MED ORDER — ASPIRIN EC 325 MG PO TBEC
325.0000 mg | DELAYED_RELEASE_TABLET | Freq: Every day | ORAL | Status: DC
  Administered 2015-09-06 – 2015-09-11 (×6): 325 mg via ORAL
  Filled 2015-09-05 (×6): qty 1

## 2015-09-05 MED ORDER — PIPERACILLIN-TAZOBACTAM 3.375 G IVPB 30 MIN
3.3750 g | Freq: Once | INTRAVENOUS | Status: AC
  Administered 2015-09-05: 3.375 g via INTRAVENOUS
  Filled 2015-09-05: qty 50

## 2015-09-05 NOTE — Progress Notes (Signed)
Peripherally Inserted Central Catheter/Midline Placement  The IV Nurse has discussed with the patient and/or persons authorized to consent for the patient, the purpose of this procedure and the potential benefits and risks involved with this procedure.  The benefits include less needle sticks, lab draws from the catheter and patient may be discharged home with the catheter.  Risks include, but not limited to, infection, bleeding, blood clot (thrombus formation), and puncture of an artery; nerve damage and irregular heat beat.  Alternatives to this procedure were also discussed.  PICC/Midline Placement Documentation  PICC / Midline Double Lumen 00/17/49 PICC Left Basilic 43 cm 1 cm (Active)  Indication for Insertion or Continuance of Line Poor Vasculature-patient has had multiple peripheral attempts or PIVs lasting less than 24 hours 09/05/2015  6:07 PM  Exposed Catheter (cm) 1 cm 09/05/2015  6:07 PM  Site Assessment Clean;Dry;Intact 09/05/2015  6:07 PM  Lumen #1 Status Flushed;Saline locked;Blood return noted 09/05/2015  6:07 PM  Lumen #2 Status Flushed;Saline locked;Blood return noted 09/05/2015  6:07 PM  Dressing Type Transparent 09/05/2015  6:07 PM  Dressing Status Clean;Dry;Intact 09/05/2015  6:07 PM  Dressing Change Due 09/12/15 09/05/2015  6:07 PM       Gordan Payment 09/05/2015, 6:08 PM

## 2015-09-05 NOTE — H&P (Signed)
[facility] History and Physical     History and Physical:    [name]   QMG:[id number] DOB: February 22, 1928 DOA: 09/05/2015  Referring MD/provider: Dr. Lura Em PCP: Binnie Rail, MD   Chief Complaint: SOB and weakness  History of Present Illness:   [name] is an 81 y.o. female past medical history of pulmonary fibrosis oxygen dependent, chronic diastolic heart failure recently discharged from the hospital twice for possible pneumonia treated empirically with IV antibiotics her last admission she was discharged on 09/04/2015 by myself comes back in with hypoxia productive cough and generalized weakness. She relates she has been taking her medication but continues to be short of breath. She relates that this morning when she woke up she was severely short of breath with a productive brownish cough with no blood.  In the ED: She was found to be febrile mildly hyponatremic elected to less than 2 and a white count of 13 (she is on steroids her previous white blood cell count was 11) with a left shift chest x-ray did not show any infiltrates or your consulted for further evaluation.  ROS:   ROS  Constitutional: No fever, no chills;  Appetite normal; No weight loss, no weight gain, no fatigue.   HEENT: No blurry vision, no diplopia, no pharyngitis, no dysphagia  CV: No chest pain, no palpitations, no PND, no orthopnea, no edema.   Resp: No SOB, no cough, no pleuritic pain.  GI: No nausea, no vomiting, no diarrhea, no melena, no hematochezia, no constipation, no abdominal pain.   GU: No dysuria, no hematuria, no frequency, no urgency.  MSK: No myalgias, no arthralgias.   Neuro:  No headache, no focal neurological deficits, no history of seizures.   Psych: No depression, no anxiety.   Endo: No heat intolerance, no cold intolerance, no polyuria, no polydipsia   Skin: No rashes, no skin lesions.   Heme: No easy bruising.   Travel history: No recent travel.   Past  Medical History:   Past Medical History  Diagnosis Date  . Dyspnea   . Diverticulosis 2008  . Internal hemorrhoids   . Esophageal stricture   . Arthritis   . Breast cancer ([place]) 1969  . GERD (gastroesophageal reflux disease)   . Gallstones   . Depression   . HLD (hyperlipidemia)   . HTN (hypertension)   . Obesity   . Colon polyp 2013    TUBULAR ADENOMA (X1  . Pulmonary fibrosis ([place])   . CAD (coronary artery disease)   . Chronic diastolic CHF (congestive heart failure) ([place]) 08/01/2015    Past Surgical History:   Past Surgical History  Procedure Laterality Date  . Abdominal hysterectomy    . Cholecystectomy    . Mastectomy      right  . Appendectomy    . Foot surgery    . Breast surgery      Social History:   Social History   Social History  . Marital Status: Married    Spouse Name: N/A  . Number of Children: 0  . Years of Education: N/A   Occupational History  . retired    Social History Main Topics  . Smoking status: Never Smoker   . Smokeless tobacco: Never Used  . Alcohol Use: 4.2 oz/week    7 Glasses of wine per week     Comment: ocaasional  . Drug Use: No  . Sexual Activity: Not on file   Other Topics Concern  . Not  on file   Social History Narrative    Family history:   Family History  Problem Relation Age of Onset  . Heart disease Mother   . Cancer Mother     bladder  . Esophageal cancer Brother   . Prostate cancer Brother   . Heart disease Brother   . Cancer Brother     sarcoma  . Stomach cancer Neg Hx     Allergies   Anesthetics, amide; Codeine; Darvocet; Antihistamines, loratadine-type; and Moxifloxacin  Current Medications:   Prior to Admission medications   Medication Sig Start Date End Date Taking? Authorizing Provider  acetaminophen (TYLENOL) 650 MG CR tablet Take 1,300 mg by mouth every 8 (eight) hours as needed for pain.   Yes Historical Provider, MD  aspirin EC 325 MG EC tablet Take 1 tablet (325 mg total) by  mouth daily. 08/20/14  Yes Marton Redwood, MD  benzonatate (TESSALON) 200 MG capsule Take 1 capsule (200 mg total) by mouth 3 (three) times daily as needed for cough. 09/04/15  Yes Charlynne Cousins, MD  Cholecalciferol (VITAMIN D) 2000 UNITS tablet Take 2,000 Units by mouth at bedtime.   Yes Historical Provider, MD  doxycycline (VIBRA-TABS) 100 MG tablet Take 1 tablet (100 mg total) by mouth every 12 (twelve) hours. Patient taking differently: Take 100 mg by mouth every 12 (twelve) hours. Started 10/29 for 5 days 09/04/15  Yes Charlynne Cousins, MD  esomeprazole (NEXIUM) 20 MG capsule Take 20 mg by mouth 2 (two) times daily.    Yes Historical Provider, MD  fluticasone (FLONASE) 50 MCG/ACT nasal spray Place 2 sprays into both nostrils once as needed for allergies or rhinitis.   Yes Historical Provider, MD  furosemide (LASIX) 20 MG tablet Take 20 mg by mouth daily as needed for fluid.    Yes Historical Provider, MD  ipratropium-albuterol (DUONEB) 0.5-2.5 (3) MG/3ML SOLN Take 3 mLs by nebulization every 6 (six) hours as needed. Patient taking differently: Take 3 mLs by nebulization every 6 (six) hours as needed (sob).  08/05/15  Yes Robbie Lis, MD  Magnesium 250 MG TABS Take 250 mg by mouth at bedtime.    Yes Historical Provider, MD  MELATONIN PO Take 1 tablet by mouth at bedtime.   Yes Historical Provider, MD  predniSONE (DELTASONE) 10 MG tablet Takes 6 tablets for 2 days, then 5 tablets for 2 days, then 4 tablets for 2 days, then 3 tablets for 2 days, then 2 tabs daily 09/04/15  Yes Charlynne Cousins, MD  sertraline (ZOLOFT) 100 MG tablet Take 100 mg by mouth daily with breakfast.    Yes Historical Provider, MD  sodium chloride (OCEAN) 0.65 % SOLN nasal spray Place 1 spray into both nostrils 2 (two) times daily. Patient taking differently: Place 1 spray into both nostrils as needed for congestion.  08/20/14  Yes Marton Redwood, MD  traMADol (ULTRAM) 50 MG tablet Take 50 mg by mouth every 6 (six)  hours as needed for moderate pain.    Yes Historical Provider, MD  valsartan (DIOVAN) 320 MG tablet Take 1 tablet by mouth daily with breakfast.  06/30/15  Yes Historical Provider, MD    Physical Exam:   Filed Vitals:   09/05/15 1155 09/05/15 1219 09/05/15 1230 09/05/15 1300  BP:  124/43 131/45 158/65  Pulse:   90 105  Temp: 101.5 F (38.6 C) 97.8 F (36.6 C)    TempSrc: Rectal Oral    Resp:  '28 27 24  ' SpO2:  93% 88% 86%     Physical Exam: Blood pressure 158/65, pulse 105, temperature 97.8 F (36.6 C), temperature source Oral, resp. rate 24, SpO2 86 %. Gen: No acute distress. Head: Normocephalic, atraumatic. Eyes: PERRL, EOMI, sclerae nonicteric. Mouth: Oropharynx Neck: Supple, no thyromegaly, no lymphadenopathy, no jugular venous distention. Chest: Good air movement with diffuse rhonchi bilaterally CV: Regular rate and rhythm with positive S1-S2 no murmurs rubs gallops Abdomen: Soft, nontender, nondistended with normal active bowel sounds. Extremities: Extremities Skin: Warm and dry. Neuro: Alert and oriented times3; cranial nerves II through XII grossly intact. Psych: Mood and affect normal.   Data Review:    Labs: Basic Metabolic Panel:  Recent Labs Lab 08/31/15 1232 09/01/15 0510 09/02/15 0510 09/03/15 1025 09/05/15 1212  NA 132* 133* 129* 134* 133*  K 4.3 4.2 5.3* 4.6 4.1  CL 97* 99* 98* 102 99*  CO2 '27 28 25 27 28  ' GLUCOSE 105* 89 134* 156* 111*  BUN '10 12 11 12 16  ' CREATININE 0.67 0.71 0.54 0.55 0.68  CALCIUM 9.6 9.6 9.4 9.2 9.3   Liver Function Tests:  Recent Labs Lab 09/05/15 1212  AST 29  ALT 32  ALKPHOS 73  BILITOT 0.8  PROT 6.3*  ALBUMIN 2.9*   No results for input(s): LIPASE, AMYLASE in the last 168 hours. No results for input(s): AMMONIA in the last 168 hours. CBC:  Recent Labs Lab 08/31/15 1232 09/01/15 0510 09/05/15 1212  WBC 13.1* 11.0* 13.2*  NEUTROABS 10.7*  --  11.0*  HGB 13.1 11.9* 12.1  HCT 40.0 36.3 38.0  MCV  90.7 91.0 90.9  PLT 237 238 304   Cardiac Enzymes: No results for input(s): CKTOTAL, CKMB, CKMBINDEX, TROPONINI in the last 168 hours.  BNP (last 3 results) No results for input(s): PROBNP in the last 8760 hours. CBG: No results for input(s): GLUCAP in the last 168 hours.  Radiographic Studies: Dg Chest Port 1 View  09/05/2015  CLINICAL DATA:  Shortness of breath.  History of pulmonary fibrosis EXAM: PORTABLE CHEST 1 VIEW COMPARISON:  08/30/2017 FINDINGS: Normal heart size. There is no pleural effusion or edema identified. No airspace consolidation noted. Advanced interstitial changes of pulmonary fibrosis identified. No airspace consolidation noted. IMPRESSION: 1. Chronic lung disease of pulmonary fibrosis. 2. No superimposed pneumonia. Electronically Signed   By: Kerby Moors M.D.   On: 09/05/2015 12:57   *I have personally reviewed the images above*  EKG: Independently reviewed. none   Assessment/Plan:   Acute respiratory failure with hypoxia (HCC)/  Acute bronchitis /Sepsis ([place]) - She was desaturating when she got into the hospital at baseline she uses 2-3 L, she had to be on 4 liters of oxygen. - I agree with admission and IV empiric antibiotics vancomycin and Zosyn, continue oral steroids check an ESR. - Blood cultures and sputum cultures have already been ordered by the ED. - We'll give her an additional liter of normal saline and hold her antihypertensive medication. - Her lactic acid was less than 2. Her blood pressure improve after IV fluid resuscitation. - start florastor  Essential hypertension - Hold antihypertensive medication, she was mildly hypotensive and tachycardic on admission. It is now resolved after IV fluid resuscitation.  Chronic hyponatremia Multifactorial due to heart failure and pulmonary problem.  Chronic diastolic heart failure: hold diuretic and ARB, estimated dry weight seems to be close to baseline, she seems to be euvolemic on physical  exam  Leukocytosis: Multifactorial due to steroids and possibly active infection.  DVT  prophylaxis: heparin  Code Status: Full. Family Communication: none Disposition Plan: Home when stable.  Time spent: 65 min  Charlynne Cousins [facility] Pager [phone]  If 7PM-7AM, please contact night-coverage [email or web address] Password [email or web address] 09/05/2015, 2:55 PM

## 2015-09-05 NOTE — ED Notes (Signed)
Luberta Mutter assisted me with in and out cath.

## 2015-09-05 NOTE — ED Notes (Signed)
Per EMS pt from home with c/o shortness of breath, was seen here yesterday for same and told she has pulmonary fibrosis; per EMS pt also with hx of CHF, reports gets SOB with activity, uses O2 at 2L at home, EMS reports O2 sats 95 on 3L. Denies pain.

## 2015-09-05 NOTE — ED Provider Notes (Signed)
CSN: [id number]     Arrival date & time 09/05/15  1132 History   First MD Initiated Contact with Patient 09/05/15 1151     Chief Complaint  Patient presents with  . Shortness of Breath     (Consider location/radiation/quality/duration/timing/severity/associated sxs/prior Treatment) Patient is a 81 y.o. female presenting with shortness of breath.  Shortness of Breath Severity:  Moderate Onset quality:  Gradual Duration: began prioir to admission, improved then worsened this AM. Timing:  Constant Progression:  Unchanged Chronicity:  New Relieved by:  Nothing Worsened by:  Nothing tried Associated symptoms: cough   Associated symptoms: no abdominal pain, no chest pain, no fever (not at home), no headaches, no neck pain, no rash, no sore throat, no syncope and no vomiting   Risk factors: no hx of PE/DVT, no prolonged immobilization and no recent surgery     Past Medical History  Diagnosis Date  . Dyspnea   . Diverticulosis 2008  . Internal hemorrhoids   . Esophageal stricture   . Arthritis   . Breast cancer ([place]) 1969  . GERD (gastroesophageal reflux disease)   . Gallstones   . Depression   . HLD (hyperlipidemia)   . HTN (hypertension)   . Obesity   . Colon polyp 2013    TUBULAR ADENOMA (X1  . Pulmonary fibrosis ([place])   . CAD (coronary artery disease)   . Chronic diastolic CHF (congestive heart failure) ([place]) 08/01/2015   Past Surgical History  Procedure Laterality Date  . Abdominal hysterectomy    . Cholecystectomy    . Mastectomy      right  . Appendectomy    . Foot surgery    . Breast surgery     Family History  Problem Relation Age of Onset  . Heart disease Mother   . Cancer Mother     bladder  . Esophageal cancer Brother   . Prostate cancer Brother   . Heart disease Brother   . Cancer Brother     sarcoma  . Stomach cancer Neg Hx    Social History  Substance Use Topics  . Smoking status: Never Smoker   . Smokeless tobacco: Never Used  . Alcohol  Use: 4.2 oz/week    7 Glasses of wine per week     Comment: ocaasional   OB History    No data available     Review of Systems  Constitutional: Positive for chills and fatigue. Negative for fever (not at home).  HENT: Negative for sore throat.   Eyes: Negative for visual disturbance.  Respiratory: Positive for cough and shortness of breath.   Cardiovascular: Negative for chest pain and syncope.  Gastrointestinal: Negative for nausea, vomiting, abdominal pain, diarrhea and constipation.  Genitourinary: Negative for difficulty urinating.  Musculoskeletal: Negative for back pain and neck pain.  Skin: Negative for rash.  Neurological: Positive for weakness (generalized). Negative for syncope and headaches.      Allergies  Anesthetics, amide; Codeine; Darvocet; Antihistamines, loratadine-type; and Moxifloxacin  Home Medications   Prior to Admission medications   Medication Sig Start Date End Date Taking? Authorizing Provider  acetaminophen (TYLENOL) 650 MG CR tablet Take 1,300 mg by mouth every 8 (eight) hours as needed for pain.   Yes Historical Provider, MD  aspirin EC 325 MG EC tablet Take 1 tablet (325 mg total) by mouth daily. 08/20/14  Yes Marton Redwood, MD  benzonatate (TESSALON) 200 MG capsule Take 1 capsule (200 mg total) by mouth 3 (three) times daily as needed  for cough. 09/04/15  Yes Charlynne Cousins, MD  Cholecalciferol (VITAMIN D) 2000 UNITS tablet Take 2,000 Units by mouth at bedtime.   Yes Historical Provider, MD  doxycycline (VIBRA-TABS) 100 MG tablet Take 1 tablet (100 mg total) by mouth every 12 (twelve) hours. Patient taking differently: Take 100 mg by mouth every 12 (twelve) hours. Started 10/29 for 5 days 09/04/15  Yes Charlynne Cousins, MD  esomeprazole (NEXIUM) 20 MG capsule Take 20 mg by mouth 2 (two) times daily.    Yes Historical Provider, MD  fluticasone (FLONASE) 50 MCG/ACT nasal spray Place 2 sprays into both nostrils once as needed for allergies or  rhinitis.   Yes Historical Provider, MD  furosemide (LASIX) 20 MG tablet Take 20 mg by mouth daily as needed for fluid.    Yes Historical Provider, MD  ipratropium-albuterol (DUONEB) 0.5-2.5 (3) MG/3ML SOLN Take 3 mLs by nebulization every 6 (six) hours as needed. Patient taking differently: Take 3 mLs by nebulization every 6 (six) hours as needed (sob).  08/05/15  Yes Robbie Lis, MD  Magnesium 250 MG TABS Take 250 mg by mouth at bedtime.    Yes Historical Provider, MD  MELATONIN PO Take 1 tablet by mouth at bedtime.   Yes Historical Provider, MD  predniSONE (DELTASONE) 10 MG tablet Takes 6 tablets for 2 days, then 5 tablets for 2 days, then 4 tablets for 2 days, then 3 tablets for 2 days, then 2 tabs daily 09/04/15  Yes Charlynne Cousins, MD  sertraline (ZOLOFT) 100 MG tablet Take 100 mg by mouth daily with breakfast.    Yes Historical Provider, MD  sodium chloride (OCEAN) 0.65 % SOLN nasal spray Place 1 spray into both nostrils 2 (two) times daily. Patient taking differently: Place 1 spray into both nostrils as needed for congestion.  08/20/14  Yes Marton Redwood, MD  traMADol (ULTRAM) 50 MG tablet Take 50 mg by mouth every 6 (six) hours as needed for moderate pain.    Yes Historical Provider, MD  valsartan (DIOVAN) 320 MG tablet Take 1 tablet by mouth daily with breakfast.  06/30/15  Yes Historical Provider, MD   BP 125/40 mmHg  Pulse 73  Temp(Src) 97.7 F (36.5 C) (Oral)  Resp 20  Ht 5\' 1"  (1.549 m)  Wt 153 lb (69.4 kg)  BMI 28.92 kg/m2  SpO2 96% Physical Exam  Constitutional: She is oriented to person, place, and time. She appears well-developed and well-nourished. She appears ill. No distress.  HENT:  Head: Normocephalic and atraumatic.  Eyes: Conjunctivae and EOM are normal.  Neck: Normal range of motion.  Cardiovascular: Normal rate, regular rhythm, normal heart sounds and intact distal pulses.  Exam reveals no gallop and no friction rub.   No murmur heard. Pulmonary/Chest:  Effort normal. No respiratory distress. She has no wheezes. She has rales (diffuse).  Abdominal: Soft. She exhibits no distension. There is no tenderness. There is no guarding.  Musculoskeletal: She exhibits no edema or tenderness.  Neurological: She is alert and oriented to person, place, and time.  Skin: Skin is warm and dry. No rash noted. She is not diaphoretic. No erythema.  Nursing note and vitals reviewed.   ED Course  Procedures (including critical care time) Labs Review Labs Reviewed  COMPREHENSIVE METABOLIC PANEL - Abnormal; Notable for the following:    Sodium 133 (*)    Chloride 99 (*)    Glucose, Bld 111 (*)    Total Protein 6.3 (*)    Albumin  2.9 (*)    All other components within normal limits  CBC WITH DIFFERENTIAL/PLATELET - Abnormal; Notable for the following:    WBC 13.2 (*)    Neutro Abs 11.0 (*)    Monocytes Absolute 1.2 (*)    All other components within normal limits  CBC - Abnormal; Notable for the following:    WBC 12.1 (*)    RBC 3.83 (*)    Hemoglobin 11.1 (*)    HCT 34.3 (*)    All other components within normal limits  CULTURE, BLOOD (ROUTINE X 2)  CULTURE, BLOOD (ROUTINE X 2)  URINE CULTURE  URINALYSIS, ROUTINE W REFLEX MICROSCOPIC (NOT AT [facility])  PROTIME-INR  APTT  CREATININE, SERUM  SEDIMENTATION RATE  CBC  COMPREHENSIVE METABOLIC PANEL  I-STAT CG4 LACTIC ACID, ED  Randolm Idol, ED    Imaging Review Dg Chest Port 1 View  09/05/2015  CLINICAL DATA:  Shortness of breath.  History of pulmonary fibrosis EXAM: PORTABLE CHEST 1 VIEW COMPARISON:  08/30/2017 FINDINGS: Normal heart size. There is no pleural effusion or edema identified. No airspace consolidation noted. Advanced interstitial changes of pulmonary fibrosis identified. No airspace consolidation noted. IMPRESSION: 1. Chronic lung disease of pulmonary fibrosis. 2. No superimposed pneumonia. Electronically Signed   By: Kerby Moors M.D.   On: 09/05/2015 12:57   I have personally  reviewed and evaluated these images and lab results as part of my medical decision-making.   EKG Interpretation   Date/Time:  Sunday September 05 2015 11:45:57 EDT Ventricular Rate:  88 PR Interval:  167 QRS Duration: 99 QT Interval:  348 QTC Calculation: 421 R Axis:   69 Text Interpretation:  Sinus rhythm Probable left atrial enlargement  Baseline wander in lead(s) II III aVF No significant change since last  tracing Confirmed by [facility] MD, [place] ([id number]) on 09/05/2015 8:49:21 PM      MDM   Final diagnoses:  Fever, unspecified fever cause  Hypotension, unspecified hypotension type, resolved  Sepsis, due to unspecified organism [facility])   81yo female with history of htn, pulmonary fibrosis, chronic respiratory failure on 2L of O2 at home, chronic diastolic heart failure, recent admission for concern of possible pneumonia with discharge yesterday presents with concern for worsening shortness of breath and fatigue.  Patient with increased O2 requirement on arrival requiring 4L of O2, hypotension to 88/40 and febrile to 101.4 rectally.  Code sepsis initiated and patient given vancomycin//zosyn after blood cx ordered.  CXR does not show pneumonia. Urinalysis negative. Doubt meningitis by hx. Bolus of NS given and blood pressures recovered.  Patient admitted for concern for sepsis of unknown origin, now hemodynamically stable.     Gareth Morgan, MD 09/05/15 2107

## 2015-09-05 NOTE — Progress Notes (Signed)
ANTIBIOTIC CONSULT NOTE - INITIAL  Pharmacy Consult for Vancomycin, Zosyn Indication: rule out sepsis  Allergies  Allergen Reactions  . Anesthetics, Amide Nausea And Vomiting  . Codeine Nausea And Vomiting  . Darvocet [Propoxyphene N-Acetaminophen] Nausea And Vomiting  . Antihistamines, Loratadine-Type Other (See Comments)    Weird dreams  . Moxifloxacin Anxiety and Other (See Comments)    Nightmares    Patient Measurements: Height: 5\' 1"  (154.9 cm) Weight: 153 lb (69.4 kg) IBW/kg (Calculated) : 47.8  Vital Signs: Temp: 97.8 F (36.6 C) (10/30 1219) Temp Source: Oral (10/30 1219) BP: 110/83 mmHg (10/30 1400) Pulse Rate: 91 (10/30 1400) Intake/Output from previous day:   Intake/Output from this shift: Total I/O In: -  Out: 400 [Urine:400]  Labs:  Recent Labs  09/03/15 1025 09/05/15 1212  WBC  --  13.2*  HGB  --  12.1  PLT  --  304  CREATININE 0.55 0.68   Estimated Creatinine Clearance: 45.8 mL/min (by C-G formula based on Cr of 0.68). No results for input(s): VANCOTROUGH, VANCOPEAK, VANCORANDOM, GENTTROUGH, GENTPEAK, GENTRANDOM, TOBRATROUGH, TOBRAPEAK, TOBRARND, AMIKACINPEAK, AMIKACINTROU, AMIKACIN in the last 72 hours.   Microbiology: No results found for this or any previous visit (from the past 720 hour(s)).  Medical History: Past Medical History  Diagnosis Date  . Dyspnea   . Diverticulosis 2008  . Internal hemorrhoids   . Esophageal stricture   . Arthritis   . Breast cancer ([place]) 1969  . GERD (gastroesophageal reflux disease)   . Gallstones   . Depression   . HLD (hyperlipidemia)   . HTN (hypertension)   . Obesity   . Colon polyp 2013    TUBULAR ADENOMA (X1  . Pulmonary fibrosis ([place])   . CAD (coronary artery disease)   . Chronic diastolic CHF (congestive heart failure) ([facility]) 08/01/2015    Medications:  Anti-infectives    Start     Dose/Rate Route Frequency Ordered Stop   09/05/15 2000  piperacillin-tazobactam (ZOSYN) IVPB 3.375 g   Status:  Discontinued     3.375 g 12.5 mL/hr over 240 Minutes Intravenous Every 8 hours 09/05/15 1229 09/05/15 1631   09/05/15 2000  piperacillin-tazobactam (ZOSYN) IVPB 3.375 g     3.375 g 12.5 mL/hr over 240 Minutes Intravenous Every 8 hours 09/05/15 1631     09/05/15 1645  vancomycin (VANCOCIN) IVPB 1000 mg/200 mL premix  Status:  Discontinued     1,000 mg 200 mL/hr over 60 Minutes Intravenous  Once 09/05/15 1631 09/05/15 1643   09/05/15 1400  piperacillin-tazobactam (ZOSYN) IVPB 3.375 g  Status:  Discontinued     3.375 g 100 mL/hr over 30 Minutes Intravenous 3 times per day 09/05/15 1200 09/05/15 1229   09/05/15 1230  piperacillin-tazobactam (ZOSYN) IVPB 3.375 g     3.375 g 100 mL/hr over 30 Minutes Intravenous  Once 09/05/15 1229 09/05/15 1454   09/05/15 1215  vancomycin (VANCOCIN) IVPB 1000 mg/200 mL premix     1,000 mg 200 mL/hr over 60 Minutes Intravenous  Once 09/05/15 1200 09/05/15 1453    Assessment: 81 y.o. female with PMH of pulmonary fibrosis on O2, HFpEF presents with hypoxia, productive cough, and general weakness.  Recently admitted twice from hospital for possible PNA and treated with IV abx, with last discharge only yesterday. Pharmacy consulted to dose vancomycin/Zosyn for r/o sepsis.  First doses given in ED  10/30 >> Zosyn >> 10/30 >> Vancomycin >>    10/30 blood: IP 10/30 urine: IP  Today, 09/05/2015: Tmax 101.5  WBC moderately elevated Renal: SCr remains stable from previous encounters; CrCl 46 CG, mainly d/t stature and advanced age LA wnl CXR clear 10/30   Goal of Therapy:  Vancomycin trough level 15-20 mcg/ml  Eradication of infection Appropriate antibiotic dosing for indication and renal function  Plan:  Day 1 antibiotics Vancomycin 500 mg IV q12 hr Measure vancomycin trough levels at steady state as indicated Zosyn 3.375 g IV given once over 30 minutes, then every 8 hrs by 4-hr infusion  Follow clinical course, renal function, culture  results as available  Follow for de-escalation of antibiotics and LOT   Reuel Boom, PharmD, BCPS Pager: [phone] 09/05/2015, 4:46 PM

## 2015-09-05 NOTE — ED Notes (Signed)
Bed: WA07 Expected date:  Expected time:  Means of arrival:  Comments: EMS-SOB 

## 2015-09-05 NOTE — Progress Notes (Signed)
Report called to Beverlee Nims, RN on [facility]

## 2015-09-06 ENCOUNTER — Inpatient Hospital Stay (HOSPITAL_COMMUNITY): Payer: Medicare Other

## 2015-09-06 DIAGNOSIS — I959 Hypotension, unspecified: Secondary | ICD-10-CM | POA: Insufficient documentation

## 2015-09-06 DIAGNOSIS — R509 Fever, unspecified: Secondary | ICD-10-CM | POA: Insufficient documentation

## 2015-09-06 LAB — COMPREHENSIVE METABOLIC PANEL
ALBUMIN: 2.5 g/dL — AB (ref 3.5–5.0)
ALT: 29 U/L (ref 14–54)
AST: 23 U/L (ref 15–41)
Alkaline Phosphatase: 64 U/L (ref 38–126)
Anion gap: 6 (ref 5–15)
BILIRUBIN TOTAL: 1 mg/dL (ref 0.3–1.2)
BUN: 14 mg/dL (ref 6–20)
CHLORIDE: 104 mmol/L (ref 101–111)
CO2: 27 mmol/L (ref 22–32)
Calcium: 8.9 mg/dL (ref 8.9–10.3)
Creatinine, Ser: 0.57 mg/dL (ref 0.44–1.00)
Glucose, Bld: 86 mg/dL (ref 65–99)
POTASSIUM: 4.2 mmol/L (ref 3.5–5.1)
SODIUM: 137 mmol/L (ref 135–145)
Total Protein: 5.5 g/dL — ABNORMAL LOW (ref 6.5–8.1)

## 2015-09-06 LAB — URINE CULTURE: CULTURE: NO GROWTH

## 2015-09-06 LAB — CBC
HCT: 34.3 % — ABNORMAL LOW (ref 36.0–46.0)
Hemoglobin: 11 g/dL — ABNORMAL LOW (ref 12.0–15.0)
MCH: 29.3 pg (ref 26.0–34.0)
MCHC: 32.1 g/dL (ref 30.0–36.0)
MCV: 91.5 fL (ref 78.0–100.0)
PLATELETS: 298 10*3/uL (ref 150–400)
RBC: 3.75 MIL/uL — ABNORMAL LOW (ref 3.87–5.11)
RDW: 15.4 % (ref 11.5–15.5)
WBC: 12.3 10*3/uL — ABNORMAL HIGH (ref 4.0–10.5)

## 2015-09-06 MED ORDER — LIP MEDEX EX OINT
TOPICAL_OINTMENT | CUTANEOUS | Status: AC
  Filled 2015-09-06: qty 7

## 2015-09-06 NOTE — Progress Notes (Signed)
[facility] PROGRESS NOTE  DUBLIN CANTERO RXV:[id number] DOB: 1928/08/04 DOA: 09/05/2015 PCP: Binnie Rail, MD  Assessment/Plan: Principle problem Acute respiratory failure with hypoxia/ acute bronchitis: Patient was desaturating when she got to the hospital, at baseline she  uses 2-3 L, she had to be on 4L. Started on empiric IV vancomycin an Zosyn, continue on oral steroids. Hold antihypertensives. Picc line placed. Continue on Florastor and Zosyn. Consult pulmonary.   Active problems Essential hypertension: hold home antihypertensive medications.   Chronic hyponatremia:  Multifactorial due to heart failure and pulmonary problem.   Chronic diastolic heart failure: appears euvolemic on exam, hold diuretic and ARB.  Leukocytosis: Multifactorial due to steroids and possible active infection.  DVT prophylaxis: on heparin.  Code Status: Full Family Communication: none at bedside Disposition Plan: Home when stable    Consultants:  Pulmonary  Procedures:  picc line placed  Antibiotics:  See below  HPI/Subjective: [name] is an 81 y. o female with a past medical history of pulmonary fibrosis -oxygen dependent, chronic diastolic heart failure, and hypertension with two recent hospital visits for possible pneumonia treated empirically with IV antibiotics. Her last discharge was 09/04/2015. She returned with hypoxia, productive cough and generalized weakness on 09/05/2015. Patients says she has been taking her oral antibiotics but continues to be short of breath. She has been started on  Vancomycin and Zosyn.   Objective: Filed Vitals:   09/06/15 0450  BP: 191/54  Pulse: 73  Temp: 98.6 F (37 C)  Resp: 16    Intake/Output Summary (Last 24 hours) at 09/06/15 1339 Last data filed at 09/06/15 0533  Gross per 24 hour  Intake  394.4 ml  Output    400 ml  Net   -5.6 ml   Filed Weights   09/05/15 1455 09/05/15 1620  Weight: 69.4 kg (153 lb) 69.4 kg (153 lb)     Exam:   General: No acute distress,   Head:normocephalic  Cardiovascular: S1 and S2 heard on anterior ascultation  Respiratory: Regular effort , diffuse rhonchi bilaterally  Abdomen: Soft, non-tender, non-distended  Musculoskeletal: Moves all extremities.  Skin: warm and dry  Psych: normal mood and affect.  Data Reviewed: Basic Metabolic Panel:  Recent Labs Lab 09/01/15 0510 09/02/15 0510 09/03/15 1025 09/05/15 1212 09/05/15 1900 09/06/15 0539  NA 133* 129* 134* 133*  --  137  K 4.2 5.3* 4.6 4.1  --  4.2  CL 99* 98* 102 99*  --  104  CO2 28 25 27 28   --  27  GLUCOSE 89 134* 156* 111*  --  86  BUN 12 11 12 16   --  14  CREATININE 0.71 0.54 0.55 0.68 0.67 0.57  CALCIUM 9.6 9.4 9.2 9.3  --  8.9   Liver Function Tests:  Recent Labs Lab 09/05/15 1212 09/06/15 0539  AST 29 23  ALT 32 29  ALKPHOS 73 64  BILITOT 0.8 1.0  PROT 6.3* 5.5*  ALBUMIN 2.9* 2.5*   No results for input(s): LIPASE, AMYLASE in the last 168 hours. No results for input(s): AMMONIA in the last 168 hours. CBC:  Recent Labs Lab 08/31/15 1232 09/01/15 0510 09/05/15 1212 09/05/15 1900 09/06/15 0539  WBC 13.1* 11.0* 13.2* 12.1* 12.3*  NEUTROABS 10.7*  --  11.0*  --   --   HGB 13.1 11.9* 12.1 11.1* 11.0*  HCT 40.0 36.3 38.0 34.3* 34.3*  MCV 90.7 91.0 90.9 89.6 91.5  PLT 237 238 304 290 298   Cardiac  Enzymes: No results for input(s): CKTOTAL, CKMB, CKMBINDEX, TROPONINI in the last 168 hours. BNP (last 3 results)  Recent Labs  08/01/15 0745 08/03/15 0600 08/31/15 1232  BNP 250.1* 278.6* 358.8*    ProBNP (last 3 results) No results for input(s): PROBNP in the last 8760 hours.  CBG: No results for input(s): GLUCAP in the last 168 hours.  Recent Results (from the past 240 hour(s))  Blood Culture (routine x 2)     Status: None (Preliminary result)   Collection Time: 09/05/15 12:10 PM  Result Value Ref Range Status   Specimen Description BLOOD LEFT ANTECUBITAL  Final    Special Requests BOTTLES DRAWN AEROBIC AND ANAEROBIC 5 CC EACH  Final   Culture   Final    NO GROWTH < 24 HOURS Performed at [facility]    Report Status PENDING  Incomplete  Urine culture     Status: None   Collection Time: 09/05/15  1:00 PM  Result Value Ref Range Status   Specimen Description URINE, CATHETERIZED  Final   Special Requests NONE  Final   Culture   Final    NO GROWTH 1 DAY Performed at [facility]    Report Status 09/06/2015 FINAL  Final  Blood Culture (routine x 2)     Status: None (Preliminary result)   Collection Time: 09/05/15  5:16 PM  Result Value Ref Range Status   Specimen Description BLOOD LEFT HAND  Final   Special Requests BOTTLES DRAWN AEROBIC ONLY 5CC AEROBIC ONLY  Final   Culture   Final    NO GROWTH < 24 HOURS Performed at [facility]    Report Status PENDING  Incomplete     Studies: Dg Chest Port 1 View  09/05/2015  CLINICAL DATA:  Shortness of breath.  History of pulmonary fibrosis EXAM: PORTABLE CHEST 1 VIEW COMPARISON:  08/30/2017 FINDINGS: Normal heart size. There is no pleural effusion or edema identified. No airspace consolidation noted. Advanced interstitial changes of pulmonary fibrosis identified. No airspace consolidation noted. IMPRESSION: 1. Chronic lung disease of pulmonary fibrosis. 2. No superimposed pneumonia. Electronically Signed   By: Kerby Moors M.D.   On: 09/05/2015 12:57    Scheduled Meds: . aspirin EC  325 mg Oral Daily  . heparin  5,000 Units Subcutaneous 3 times per day  . lip balm      . Magnesium  200 mg Oral QHS  . piperacillin-tazobactam  3.375 g Intravenous Q8H  . predniSONE  50 mg Oral Q breakfast  . saccharomyces boulardii  250 mg Oral BID  . sertraline  100 mg Oral Q breakfast  . sodium chloride  10-40 mL Intracatheter Q12H  . vancomycin  500 mg Intravenous Q12H   Continuous Infusions:   Active Problems:   Essential hypertension   Chronic hyponatremia   Acute respiratory  failure with hypoxia ([facility])   Acute bronchitis   Sepsis ([place])    Time spent: 60 minutes    Lawernce Keas PA-S [facility] If 7PM-7AM, please contact night-coverage at [email or web address], password [email or web address] 09/06/2015, 1:39 PM  LOS: 1 day

## 2015-09-06 NOTE — Progress Notes (Signed)
   09/06/15 1500  Clinical Encounter Type  Visited With Patient  Visit Type Initial;Psychological support;Spiritual support  Referral From Nurse  Stress Factors  Patient Stress Factors Health changes;Major life changes   Chaplain paged for Advance Directive (HCPOA).   She wishes to fill out paperwork with brother, who will visit tonight.  Chaplain will return to notarize document.    Chaplain provided bedside support around progression of illness, multiple hospitalizations, death of husband in 11/22/2010.     [place], [place]

## 2015-09-06 NOTE — Progress Notes (Signed)
[facility]  Patient Status: Active (receiving services up to time of hospitalization)  [facility] is providing the following services: RN, PT and OT  If patient discharges after hours, please call [phone].   [name] 09/06/2015, 1:03 PM

## 2015-09-07 ENCOUNTER — Inpatient Hospital Stay (HOSPITAL_COMMUNITY): Payer: Medicare Other

## 2015-09-07 DIAGNOSIS — J849 Interstitial pulmonary disease, unspecified: Secondary | ICD-10-CM

## 2015-09-07 DIAGNOSIS — K222 Esophageal obstruction: Secondary | ICD-10-CM

## 2015-09-07 DIAGNOSIS — J9601 Acute respiratory failure with hypoxia: Secondary | ICD-10-CM

## 2015-09-07 LAB — VANCOMYCIN, TROUGH: VANCOMYCIN TR: 11 ug/mL (ref 10.0–20.0)

## 2015-09-07 MED ORDER — FUROSEMIDE 10 MG/ML IJ SOLN
40.0000 mg | Freq: Once | INTRAMUSCULAR | Status: AC
  Administered 2015-09-07: 40 mg via INTRAVENOUS
  Filled 2015-09-07: qty 4

## 2015-09-07 MED ORDER — POTASSIUM CHLORIDE CRYS ER 20 MEQ PO TBCR
40.0000 meq | EXTENDED_RELEASE_TABLET | Freq: Once | ORAL | Status: AC
  Administered 2015-09-07: 40 meq via ORAL
  Filled 2015-09-07: qty 2

## 2015-09-07 MED ORDER — IPRATROPIUM-ALBUTEROL 0.5-2.5 (3) MG/3ML IN SOLN
3.0000 mL | RESPIRATORY_TRACT | Status: DC
  Administered 2015-09-07 – 2015-09-09 (×5): 3 mL via RESPIRATORY_TRACT
  Filled 2015-09-07 (×7): qty 3

## 2015-09-07 MED ORDER — VANCOMYCIN HCL IN DEXTROSE 750-5 MG/150ML-% IV SOLN
750.0000 mg | Freq: Two times a day (BID) | INTRAVENOUS | Status: DC
  Administered 2015-09-08: 750 mg via INTRAVENOUS
  Filled 2015-09-07: qty 150

## 2015-09-07 NOTE — Progress Notes (Signed)
Brief Progress Note:   See progress note by Kavin Leech, [facility], for full details. In brief, this is a 51 y/oF on Vancomycin and Zosyn for aspiration PNA vs. ? hypersensitivity pneumonitis.   Vancomycin trough level returned subtherapeutic at 11 mcg/mL Renal function stable   Plan: Increase Vancomycin to 750mg  IV q12h, next dose due 11/2 at 0600. Re-check Vancomycin trough level at new steady state. Continue to monitor renal function, cultures, clinical course.   Lindell Spar, PharmD, BCPS Pager: [phone] 09/07/2015 10:42 PM

## 2015-09-07 NOTE — Progress Notes (Signed)
PT Cancellation Note  Patient Details Name: TRUDE CANSLER MRN: [id number] DOB: 1928/07/25   Cancelled Treatment:    Reason Eval/Treat Not Completed: Medical issues which prohibited therapy (RT just in room and placed pt on nonrebreather due to sats in 70s)   Demond Shallenberger,KATHrine E 09/07/2015, 9:28 AM Carmelia Bake, PT, DPT 09/07/2015 Pager: [phone]

## 2015-09-07 NOTE — Progress Notes (Signed)
HPI: FU dyspnea. Patient also with history of pulmonary fibrosis. Admitted 10/15 with worsening dyspnea. Treated medically for presumed CAD based on CT results of coronary CA. Nuclear study 3/16 showed EF 70 and no ischemia. Echocardiogram September 2016 showed vigorous LV function, grade 1 diastolic dysfunction, mild aortic insufficiency, moderate mitral stenosis with mean gradient 6 mmHg. Chest CT October 2016 showed no pulmonary embolus, chronic interstitial lung disease, bronchiectasis and severe ground glass opacities. Admitted recently with increasing dyspnea and treated with steroids and antibiotics. Seen by palliative care and made a no CODE BLUE. Since last seen,   No current facility-administered medications for this visit.   No current outpatient prescriptions on file.   Facility-Administered Medications Ordered in Other Visits  Medication Dose Route Frequency Provider Last Rate Last Dose  . acetaminophen (TYLENOL) tablet 1,000 mg  1,000 mg Oral Q6H PRN Charlynne Cousins, MD      . aspirin EC tablet 325 mg  325 mg Oral Daily Charlynne Cousins, MD   325 mg at 09/06/15 0730  . benzonatate (TESSALON) capsule 200 mg  200 mg Oral TID PRN Charlynne Cousins, MD      . heparin injection 5,000 Units  5,000 Units Subcutaneous 3 times per day Charlynne Cousins, MD   5,000 Units at 09/07/15 0518  . ipratropium-albuterol (DUONEB) 0.5-2.5 (3) MG/3ML nebulizer solution 3 mL  3 mL Nebulization Q4H Charlynne Cousins, MD   3 mL at 09/07/15 0910  . Magnesium TABS 200 mg  200 mg Oral QHS Charlynne Cousins, MD   200 mg at 09/06/15 2053  . piperacillin-tazobactam (ZOSYN) IVPB 3.375 g  3.375 g Intravenous Q8H Charlynne Cousins, MD   3.375 g at 09/07/15 0500  . polyethylene glycol (MIRALAX / GLYCOLAX) packet 17 g  17 g Oral Daily PRN Charlynne Cousins, MD      . predniSONE (DELTASONE) tablet 50 mg  50 mg Oral Q breakfast Charlynne Cousins, MD   50 mg at 09/06/15 0730  . saccharomyces  boulardii (FLORASTOR) capsule 250 mg  250 mg Oral BID Charlynne Cousins, MD   250 mg at 09/06/15 2053  . sertraline (ZOLOFT) tablet 100 mg  100 mg Oral Q breakfast Charlynne Cousins, MD   100 mg at 09/06/15 0730  . sodium chloride 0.9 % injection 10-40 mL  10-40 mL Intracatheter Q12H Charlynne Cousins, MD   10 mL at 09/06/15 2052  . sodium chloride 0.9 % injection 10-40 mL  10-40 mL Intracatheter PRN Charlynne Cousins, MD      . traMADol Veatrice Bourbon) tablet 50 mg  50 mg Oral Q6H PRN Charlynne Cousins, MD      . vancomycin [facility]) 500 mg in sodium chloride 0.9 % 100 mL IVPB  500 mg Intravenous Q12H Polly Cobia, RPH   500 mg at 09/06/15 2048     Past Medical History  Diagnosis Date  . Dyspnea   . Diverticulosis 2008  . Internal hemorrhoids   . Esophageal stricture   . Arthritis   . Breast cancer ([place]) 1969  . GERD (gastroesophageal reflux disease)   . Gallstones   . Depression   . HLD (hyperlipidemia)   . HTN (hypertension)   . Obesity   . Colon polyp 2013    TUBULAR ADENOMA (X1  . Pulmonary fibrosis ([place])   . CAD (coronary artery disease)   . Chronic diastolic CHF (congestive heart failure) ([place]) 08/01/2015  Past Surgical History  Procedure Laterality Date  . Abdominal hysterectomy    . Cholecystectomy    . Mastectomy      right  . Appendectomy    . Foot surgery    . Breast surgery      Social History   Social History  . Marital Status: Married    Spouse Name: N/A  . Number of Children: 0  . Years of Education: N/A   Occupational History  . retired    Social History Main Topics  . Smoking status: Never Smoker   . Smokeless tobacco: Never Used  . Alcohol Use: 4.2 oz/week    7 Glasses of wine per week     Comment: ocaasional  . Drug Use: No  . Sexual Activity: Not on file   Other Topics Concern  . Not on file   Social History Narrative    ROS: no fevers or chills, productive cough, hemoptysis, dysphasia, odynophagia, melena, hematochezia,  dysuria, hematuria, rash, seizure activity, orthopnea, PND, pedal edema, claudication. Remaining systems are negative.  Physical Exam: Well-developed well-nourished in no acute distress.  Skin is warm and dry.  HEENT is normal.  Neck is supple.  Chest is clear to auscultation with normal expansion.  Cardiovascular exam is regular rate and rhythm.  Abdominal exam nontender or distended. No masses palpated. Extremities show no edema. neuro grossly intact  ECG     This encounter was created in error - please disregard.

## 2015-09-07 NOTE — Progress Notes (Signed)
ANTIBIOTIC CONSULT NOTE - follow up  Pharmacy Consult for Vancomycin, Zosyn Indication: rule out sepsis  Allergies  Allergen Reactions  . Anesthetics, Amide Nausea And Vomiting  . Codeine Nausea And Vomiting  . Darvocet [Propoxyphene N-Acetaminophen] Nausea And Vomiting  . Antihistamines, Loratadine-Type Other (See Comments)    Weird dreams  . Moxifloxacin Anxiety and Other (See Comments)    Nightmares    Patient Measurements: Height: 5\' 1"  (154.9 cm) Weight: 153 lb (69.4 kg) IBW/kg (Calculated) : 47.8  Vital Signs: Temp: 98 F (36.7 C) (11/01 1025) Temp Source: Oral (11/01 1025) BP: 132/68 mmHg (11/01 1025) Pulse Rate: 72 (11/01 1025) Intake/Output from previous day: 10/31 0701 - 11/01 0700 In: 890 [P.O.:540; IV Piggyback:350] Out: 600 [Urine:600] Intake/Output from this shift:    Labs:  Recent Labs  09/05/15 1212 09/05/15 1900 09/06/15 0539  WBC 13.2* 12.1* 12.3*  HGB 12.1 11.1* 11.0*  PLT 304 290 298  CREATININE 0.68 0.67 0.57   Estimated Creatinine Clearance: 45.8 mL/min (by C-G formula based on Cr of 0.57). No results for input(s): VANCOTROUGH, VANCOPEAK, VANCORANDOM, GENTTROUGH, GENTPEAK, GENTRANDOM, TOBRATROUGH, TOBRAPEAK, TOBRARND, AMIKACINPEAK, AMIKACINTROU, AMIKACIN in the last 72 hours.   Microbiology: Recent Results (from the past 720 hour(s))  Blood Culture (routine x 2)     Status: None (Preliminary result)   Collection Time: 09/05/15 12:10 PM  Result Value Ref Range Status   Specimen Description BLOOD LEFT ANTECUBITAL  Final   Special Requests BOTTLES DRAWN AEROBIC AND ANAEROBIC 5 CC EACH  Final   Culture   Final    NO GROWTH < 24 HOURS Performed at [facility]    Report Status PENDING  Incomplete  Urine culture     Status: None   Collection Time: 09/05/15  1:00 PM  Result Value Ref Range Status   Specimen Description URINE, CATHETERIZED  Final   Special Requests NONE  Final   Culture   Final    NO GROWTH 1 DAY Performed at  [facility]    Report Status 09/06/2015 FINAL  Final  Blood Culture (routine x 2)     Status: None (Preliminary result)   Collection Time: 09/05/15  5:16 PM  Result Value Ref Range Status   Specimen Description BLOOD LEFT HAND  Final   Special Requests BOTTLES DRAWN AEROBIC ONLY 5CC AEROBIC ONLY  Final   Culture   Final    NO GROWTH < 24 HOURS Performed at [facility]    Report Status PENDING  Incomplete    Medical History: Past Medical History  Diagnosis Date  . Dyspnea   . Diverticulosis 2008  . Internal hemorrhoids   . Esophageal stricture   . Arthritis   . Breast cancer ([place]) 1969  . GERD (gastroesophageal reflux disease)   . Gallstones   . Depression   . HLD (hyperlipidemia)   . HTN (hypertension)   . Obesity   . Colon polyp 2013    TUBULAR ADENOMA (X1  . Pulmonary fibrosis ([place])   . CAD (coronary artery disease)   . Chronic diastolic CHF (congestive heart failure) ([facility]) 08/01/2015    Medications:  Anti-infectives    Start     Dose/Rate Route Frequency Ordered Stop   09/05/15 2000  piperacillin-tazobactam (ZOSYN) IVPB 3.375 g  Status:  Discontinued     3.375 g 12.5 mL/hr over 240 Minutes Intravenous Every 8 hours 09/05/15 1229 09/05/15 1631   09/05/15 2000  piperacillin-tazobactam (ZOSYN) IVPB 3.375 g     3.375  g 12.5 mL/hr over 240 Minutes Intravenous Every 8 hours 09/05/15 1631     09/05/15 2000  vancomycin (VANCOCIN) 500 mg in sodium chloride 0.9 % 100 mL IVPB     500 mg 100 mL/hr over 60 Minutes Intravenous Every 12 hours 09/05/15 1703     09/05/15 1645  vancomycin (VANCOCIN) IVPB 1000 mg/200 mL premix  Status:  Discontinued     1,000 mg 200 mL/hr over 60 Minutes Intravenous  Once 09/05/15 1631 09/05/15 1643   09/05/15 1400  piperacillin-tazobactam (ZOSYN) IVPB 3.375 g  Status:  Discontinued     3.375 g 100 mL/hr over 30 Minutes Intravenous 3 times per day 09/05/15 1200 09/05/15 1229   09/05/15 1230  piperacillin-tazobactam (ZOSYN) IVPB  3.375 g     3.375 g 100 mL/hr over 30 Minutes Intravenous  Once 09/05/15 1229 09/05/15 1454   09/05/15 1215  vancomycin (VANCOCIN) IVPB 1000 mg/200 mL premix     1,000 mg 200 mL/hr over 60 Minutes Intravenous  Once 09/05/15 1200 09/05/15 1453    Assessment: 81 y.o. female with PMH of pulmonary fibrosis on O2, HFpEF presents with hypoxia, productive cough, and general weakness.  Recently admitted twice from hospital for possible PNA and treated with IV abx, with last discharge only yesterday. Pharmacy consulted to dose vancomycin/Zosyn for r/o sepsis.  First doses given in ED  10/30 >> Zosyn >> 10/30 >> Vancomycin >>    10/30 blood: NGTD 10/30 urine: NGF  Today, 09/07/2015: Tmax AF WBC moderately elevated Renal: SCr 0.57 CrCl 67mls/min LA wnl CXR clear 10/30   Goal of Therapy:  Vancomycin trough level 15-20 mcg/ml  Eradication of infection Appropriate antibiotic dosing for indication and renal function  Plan:  Continue Vancomycin 500 mg IV q12 hr Measure vancomycin trough level tonight-adjust dose accordingly Continue Zosyn 3.375 g IV every 8 hrs by 4-hr infusion  Follow clinical course, renal function, culture results as available  Follow for de-escalation of antibiotics and LOT   Dolly Rias RPh 09/07/2015, 11:25 AM Pager [phone]

## 2015-09-07 NOTE — Progress Notes (Signed)
Attempted to place patient back on nasal cannula at 5 liters. Patient could not maintain her oxygen saturation above 80%. Placed patient back on partial non re breather. Patient is currently on partial non re breather at 15L, saturating at 95%. Will continue to monitor.

## 2015-09-07 NOTE — Progress Notes (Signed)
[facility] PROGRESS NOTE  [name]:[id number] DOB: 01/07/1928 DOA: 09/05/2015 PCP: Binnie Rail, MD  Assessment/Plan: Principle problem Acute respiratory failure with hypoxia/ acute bronchitis:  Patient was desaturating when she got to the hospital, at baseline she uses 2-3 L, she had to be on 4L.  Started on empiric IV vancomycin an Zosyn, continue on oral steroids.  Held antihypertensives. Picc line placed. Continue on Florastor and Zosyn.  Consulted  pulmonary, patient does not qualify for bronch, O2 requirements much to high, and is a poor candidate for lung biopsy given her advanced lung disease. Start on Lasix. DG esophagus pending. Recommended to cont active treament ? Hypersensitivity pneumonitis vs asp PNA.  Essential hypertension:  hold home antihypertensive medications.   Chronic hyponatremia:  Multifactorial due to heart failure and pulmonary disease.   Chronic diastolic heart failure:  Appears euvolemic on exam, hold diuretic and ARB.  Leukocytosis:  Multifactorial due to steroids and possible active infection.  DVT prophylaxis: on heparin.  Code Status: Full Family Communication: none at bedside Disposition Plan: Home when stable    Consultants:  Pulmonary  Procedures:  none  Antibiotics: See below HPI/Subjective: [name] is an 81 y. o female with a past medical history of pulmonary fibrosis -oxygen dependent, chronic diastolic heart failure, and hypertension with two recent hospital visits for possible pneumonia treated empirically with IV antibiotics. Her last discharge was 09/04/2015. She returned with hypoxia, productive cough and generalized weakness on 09/05/2015.  Patients says she has been taking her oral antibiotics but continues to be short of breath. She has been started on Vancomycin and Zosyn. Is being followed by pulmonary.  Objective: Filed Vitals:   09/07/15 1025  BP: 132/68  Pulse: 72  Temp: 98 F (36.7 C)   Resp: 18    Intake/Output Summary (Last 24 hours) at 09/07/15 1218 Last data filed at 09/07/15 8119  Gross per 24 hour  Intake    890 ml  Output    600 ml  Net    290 ml   Filed Weights   09/05/15 1455 09/05/15 1620  Weight: 69.4 kg (153 lb) 69.4 kg (153 lb)    Exam:   General: No acute distress,   Head:normocephalic  Cardiovascular: S1 and S2 heard on anterior ascultation  Respiratory: Regular effort , diffuse rhonchi bilaterally  Abdomen: Soft, non-tender, non-distended  Musculoskeletal: Moves all extremities.  Skin: warm and dry  Psych: normal mood and affect.   Data Reviewed: Basic Metabolic Panel:  Recent Labs Lab 09/01/15 0510 09/02/15 0510 09/03/15 1025 09/05/15 1212 09/05/15 1900 09/06/15 0539  NA 133* 129* 134* 133*  --  137  K 4.2 5.3* 4.6 4.1  --  4.2  CL 99* 98* 102 99*  --  104  CO2 28 25 27 28   --  27  GLUCOSE 89 134* 156* 111*  --  86  BUN 12 11 12 16   --  14  CREATININE 0.71 0.54 0.55 0.68 0.67 0.57  CALCIUM 9.6 9.4 9.2 9.3  --  8.9   Liver Function Tests:  Recent Labs Lab 09/05/15 1212 09/06/15 0539  AST 29 23  ALT 32 29  ALKPHOS 73 64  BILITOT 0.8 1.0  PROT 6.3* 5.5*  ALBUMIN 2.9* 2.5*   No results for input(s): LIPASE, AMYLASE in the last 168 hours. No results for input(s): AMMONIA in the last 168 hours. CBC:  Recent Labs Lab 08/31/15 1232 09/01/15 0510 09/05/15 1212 09/05/15 1900 09/06/15 0539  WBC  13.1* 11.0* 13.2* 12.1* 12.3*  NEUTROABS 10.7*  --  11.0*  --   --   HGB 13.1 11.9* 12.1 11.1* 11.0*  HCT 40.0 36.3 38.0 34.3* 34.3*  MCV 90.7 91.0 90.9 89.6 91.5  PLT 237 238 304 290 298   Cardiac Enzymes: No results for input(s): CKTOTAL, CKMB, CKMBINDEX, TROPONINI in the last 168 hours. BNP (last 3 results)  Recent Labs  08/01/15 0745 08/03/15 0600 08/31/15 1232  BNP 250.1* 278.6* 358.8*    ProBNP (last 3 results) No results for input(s): PROBNP in the last 8760 hours.  CBG: No results for  input(s): GLUCAP in the last 168 hours.  Recent Results (from the past 240 hour(s))  Blood Culture (routine x 2)     Status: None (Preliminary result)   Collection Time: 09/05/15 12:10 PM  Result Value Ref Range Status   Specimen Description BLOOD LEFT ANTECUBITAL  Final   Special Requests BOTTLES DRAWN AEROBIC AND ANAEROBIC 5 CC EACH  Final   Culture   Final    NO GROWTH < 24 HOURS Performed at [facility]    Report Status PENDING  Incomplete  Urine culture     Status: None   Collection Time: 09/05/15  1:00 PM  Result Value Ref Range Status   Specimen Description URINE, CATHETERIZED  Final   Special Requests NONE  Final   Culture   Final    NO GROWTH 1 DAY Performed at [facility]    Report Status 09/06/2015 FINAL  Final  Blood Culture (routine x 2)     Status: None (Preliminary result)   Collection Time: 09/05/15  5:16 PM  Result Value Ref Range Status   Specimen Description BLOOD LEFT HAND  Final   Special Requests BOTTLES DRAWN AEROBIC ONLY 5CC AEROBIC ONLY  Final   Culture   Final    NO GROWTH < 24 HOURS Performed at [facility]    Report Status PENDING  Incomplete     Studies: Dg Chest Port 1 View  09/05/2015  CLINICAL DATA:  Shortness of breath.  History of pulmonary fibrosis EXAM: PORTABLE CHEST 1 VIEW COMPARISON:  08/30/2017 FINDINGS: Normal heart size. There is no pleural effusion or edema identified. No airspace consolidation noted. Advanced interstitial changes of pulmonary fibrosis identified. No airspace consolidation noted. IMPRESSION: 1. Chronic lung disease of pulmonary fibrosis. 2. No superimposed pneumonia. Electronically Signed   By: Kerby Moors M.D.   On: 09/05/2015 12:57    Scheduled Meds: . aspirin EC  325 mg Oral Daily  . heparin  5,000 Units Subcutaneous 3 times per day  . ipratropium-albuterol  3 mL Nebulization Q4H  . Magnesium  200 mg Oral QHS  . piperacillin-tazobactam  3.375 g Intravenous Q8H  . predniSONE  50 mg  Oral Q breakfast  . saccharomyces boulardii  250 mg Oral BID  . sertraline  100 mg Oral Q breakfast  . sodium chloride  10-40 mL Intracatheter Q12H  . vancomycin  500 mg Intravenous Q12H   Continuous Infusions:   Active Problems:   Essential hypertension   Chronic hyponatremia   Acute respiratory failure with hypoxia ([facility])   Acute bronchitis   Sepsis ([place])   Pyrexia   Arterial hypotension    Time spent: 60 minutes    Lawernce Keas PA-S [facility]  If 7PM-7AM, please contact night-coverage at [email or web address], password [facility] 09/07/2015, 12:18 PM  LOS: 2 days

## 2015-09-07 NOTE — Progress Notes (Signed)
Pt Sp02 on 4L [place] = 89% During Nebulizer treatment, Sp02 = 80% Placed Pt on pRB, Sp02 = 94% Will notify RN

## 2015-09-07 NOTE — Care Management Note (Signed)
Case Management Note  Patient Details  Name: [name] MRN: [id number] Date of Birth: January 19, 1928  Subjective/Objective:       81 yo admitted with Sepsis             Action/Plan: From home with [place] services  Expected Discharge Date:  09/09/15               Expected Discharge Plan:  [place]  In-House Referral:     Discharge planning Services  CM Consult  Post Acute Care Choice:  Resumption of Svcs/PTA Provider Choice offered to:     DME Arranged:    DME Agency:     HH Arranged:  RN, PT, OT, Nurse's Aide [place] Agency:  [place]  Status of Service:  In process, will continue to follow  Medicare Important Message Given:    Date Medicare IM Given:    Medicare IM give by:    Date Additional Medicare IM Given:    Additional Medicare Important Message give by:     If discussed at [place] of Stay Meetings, dates discussed:    Additional Comments: Pt active with [facility] for PT/OT/RN/aide. Will need MD orders for resumption of services at DC. CM will continue to follow. Lynnell Catalan, RN 09/07/2015, 2:55 PM

## 2015-09-07 NOTE — Consult Note (Signed)
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 Now re-admitted 10/30 again 3rd admit in 1 month for recurrent acute on chronic hypoxic resp failure in setting of ILD flare vs PNA.   SIGNIFICANT EVENTS    STUDIES:  10/25 CT chest: Upper lobe BTX, diffuse scattered GG changes. No sig change c/w prior CT in 2015.   HISTORY OF PRESENT ILLNESS:   This is a 81 year old white female followed by Dr Melvyn Novas in our office w/ working dx of steroid responsive ILD, felt possibly r/t Connective tissue disease w/ prior positive RF. She has been prednisone dependent since 2015 and O2 dependent on 2 liters. She was last seen in our office on 10/6 for a post-hospital f/u.  At this time she seemed to be stable per clinic notes w/ final recs by Dr Melvyn Novas to keep prednisone floor at 10mg /day (no longer 5mg /d) unless instructed otherwise by rheum. She was re-admitted on 10/25 shortly after decreasing her prednisone to 20 mg/d. On admit at this time she had sats decreasing to 70s on 3 liters. She was admitted and treated w/ working dx of PF flare as well as possible infection. She was eventually discharged on 10/29 on Doxy and pred taper. At time of  discharge she had not returned to baseline but had been steadily improving on vanc/zosyn/doxy and solumedrol. She improved to the point it was felt she could be safely discharged to home. By 10/30 she awoke that morning much more short of breath, weak,  w/ cough productive of thick clear mucous. She once again reported to the ED hypoxic and in marked distress. She has been admitted by the IM service. Treated again w/ broad spec abx, systemic steroids and  supplemental oxygen. PCCM asked to eval given her failure to thrive post-discharge.   PAST MEDICAL HISTORY :   has a past medical history of Dyspnea; Diverticulosis (2008); Internal hemorrhoids; Esophageal stricture; Arthritis; Breast cancer ([place]) (1969); GERD (gastroesophageal reflux disease); Gallstones; Depression; HLD (hyperlipidemia); HTN (hypertension); Obesity; Colon polyp (2013); Pulmonary fibrosis ([place]); CAD (coronary artery disease); and Chronic diastolic CHF (congestive heart failure) ([place]) (08/01/2015).  has past surgical history that includes Abdominal hysterectomy; Cholecystectomy; Mastectomy; Appendectomy; Foot surgery; and Breast surgery. Prior to Admission medications   Medication Sig Start Date End Date Taking? Authorizing Provider  acetaminophen (TYLENOL) 650 MG CR tablet Take 1,300 mg by mouth every 8 (eight) hours as needed for pain.   Yes Historical Provider, MD  aspirin EC 325 MG EC tablet Take 1 tablet (325 mg total) by mouth daily. 08/20/14  Yes Marton Redwood, MD  benzonatate (TESSALON) 200 MG capsule Take 1 capsule (200 mg total) by mouth 3 (three) times daily as needed for cough. 09/04/15  Yes Charlynne Cousins, MD  Cholecalciferol (VITAMIN D) 2000 UNITS tablet Take 2,000 Units by mouth at bedtime.   Yes Historical Provider, MD  doxycycline (VIBRA-TABS) 100 MG tablet Take 1 tablet (100 mg total) by mouth every 12 (twelve) hours. Patient taking differently: Take 100 mg by mouth every 12 (twelve) hours. Started 10/29 for 5 days 09/04/15  Yes Charlynne Cousins, MD  esomeprazole (NEXIUM) 20 MG capsule Take 20 mg by mouth  2 (two) times daily.    Yes Historical Provider, MD  fluticasone (FLONASE) 50 MCG/ACT nasal spray Place 2 sprays into both nostrils once as needed for allergies or rhinitis.   Yes Historical Provider, MD  furosemide (LASIX) 20 MG tablet Take 20 mg by mouth daily as needed for fluid.    Yes Historical Provider, MD  ipratropium-albuterol (DUONEB) 0.5-2.5 (3)  MG/3ML SOLN Take 3 mLs by nebulization every 6 (six) hours as needed. Patient taking differently: Take 3 mLs by nebulization every 6 (six) hours as needed (sob).  08/05/15  Yes Robbie Lis, MD  Magnesium 250 MG TABS Take 250 mg by mouth at bedtime.    Yes Historical Provider, MD  MELATONIN PO Take 1 tablet by mouth at bedtime.   Yes Historical Provider, MD  predniSONE (DELTASONE) 10 MG tablet Takes 6 tablets for 2 days, then 5 tablets for 2 days, then 4 tablets for 2 days, then 3 tablets for 2 days, then 2 tabs daily 09/04/15  Yes Charlynne Cousins, MD  sertraline (ZOLOFT) 100 MG tablet Take 100 mg by mouth daily with breakfast.    Yes Historical Provider, MD  sodium chloride (OCEAN) 0.65 % SOLN nasal spray Place 1 spray into both nostrils 2 (two) times daily. Patient taking differently: Place 1 spray into both nostrils as needed for congestion.  08/20/14  Yes Marton Redwood, MD  traMADol (ULTRAM) 50 MG tablet Take 50 mg by mouth every 6 (six) hours as needed for moderate pain.    Yes Historical Provider, MD  valsartan (DIOVAN) 320 MG tablet Take 1 tablet by mouth daily with breakfast.  06/30/15  Yes Historical Provider, MD   Allergies  Allergen Reactions  . Anesthetics, Amide Nausea And Vomiting  . Codeine Nausea And Vomiting  . Darvocet [Propoxyphene N-Acetaminophen] Nausea And Vomiting  . Antihistamines, Loratadine-Type Other (See Comments)    Weird dreams  . Moxifloxacin Anxiety and Other (See Comments)    Nightmares    FAMILY HISTORY:  family history includes Cancer in her brother and mother; Esophageal cancer in her brother; Heart disease in her brother and mother; Prostate cancer in her brother. There is no history of Stomach cancer. SOCIAL HISTORY:  reports that she has never smoked. She has never used smokeless tobacco. She reports that she drinks about 4.2 oz of alcohol per week. She reports that she does not use illicit drugs.  REVIEW OF SYSTEMS:   Constitutional: Negative for  fever, chills, weight loss, malaise/+fatigue and diaphoresis.  HENT: Negative for hearing loss, ear pain, nosebleeds, congestion, sore throat, neck pain, tinnitus and ear discharge.   Eyes: Negative for blurred vision, double vision, photophobia, pain, discharge and redness.  Respiratory: +cough, clear thick sputum no hemoptysis, + shortness of breath w/ cough or really any activity, + wheezing and no stridor.   Cardiovascular: Negative for chest pain, palpitations, orthopnea, claudication, no leg swelling and PND.  Gastrointestinal: Negative for heartburn, nausea, vomiting, abdominal pain, diarrhea, constipation, blood in stool and melena.  Genitourinary: Negative for dysuria, urgency, frequency, hematuria and flank pain.  Musculoskeletal: Negative for myalgias, back pain, joint pain and falls.  Skin: Negative for itching and rash.  Neurological: Negative for dizziness, tingling, tremors, sensory change, speech change, focal weakness, seizures, loss of consciousness, weakness and headaches.  Endo/Heme/Allergies: Negative for environmental allergies and polydipsia. Does not bruise/bleed easily.  SUBJECTIVE: feels a little better  VITAL SIGNS: Temp:  [97.4 F (36.3 C)-98.9 F (37.2 C)] 97.4 F (36.3 C) (11/01 0447)  Pulse Rate:  [71-84] 74 (11/01 0447) Resp:  [16-20] 18 (11/01 0447) BP: (107-149)/(42-65) 149/55 mmHg (11/01 0447) SpO2:  [89 %-97 %] 93 % (11/01 0919) NRB  PHYSICAL EXAMINATION: General:  Frail 81 year old female, not in acute distress but does have sig WOB w/ any exertion of cough  Neuro:  Awake, alert, no focal def  HEENT:  NCAT, no JVD Cardiovascular:  Rrr, no MRG  Lungs: diffuse rales, no accessory muscle use at rest.  Abdomen:  Soft, + bowel sounds no OM  Musculoskeletal:  Good strength and bulk Skin:  No edema, brisk CR, warm, good pulses.    Recent Labs Lab 09/03/15 1025 09/05/15 1212 09/05/15 1900 09/06/15 0539  NA 134* 133*  --  137  K 4.6 4.1  --  4.2    CL 102 99*  --  104  CO2 27 28  --  27  BUN 12 16  --  14  CREATININE 0.55 0.68 0.67 0.57  GLUCOSE 156* 111*  --  86    Recent Labs Lab 09/05/15 1212 09/05/15 1900 09/06/15 0539  HGB 12.1 11.1* 11.0*  HCT 38.0 34.3* 34.3*  WBC 13.2* 12.1* 12.3*  PLT 304 290 298    Recent Labs Lab 09/01/15 0510 09/05/15 1212 09/05/15 1223 09/05/15 1900 09/06/15 0539  WBC 11.0* 13.2*  --  12.1* 12.3*  LATICACIDVEN  --   --  1.98  --   --    Dg Chest Port 1 View  09/05/2015  CLINICAL DATA:  Shortness of breath.  History of pulmonary fibrosis EXAM: PORTABLE CHEST 1 VIEW COMPARISON:  08/30/2017 FINDINGS: Normal heart size. There is no pleural effusion or edema identified. No airspace consolidation noted. Advanced interstitial changes of pulmonary fibrosis identified. No airspace consolidation noted. IMPRESSION: 1. Chronic lung disease of pulmonary fibrosis. 2. No superimposed pneumonia. Electronically Signed   By: Kerby Moors M.D.   On: 09/05/2015 12:57    ASSESSMENT / PLAN: Acute on Chronic Hypoxic Respiratory Failure Steroid Responsive ILD (only finding on auto-immune eval was + RF) Bronchiectasis  Possible HCAP Diastolic Dysfxn   Acute on Chronic Hypoxic Respiratory Failure: unclear currently if this represents flare of her underlying ILD after steroid step-down (had jumped from IV solumedrol to 20mg  pred-->seems as though there was some misunderstanding of the d/c instructions) vs possibly resistant organism given her bronchiectasis.  Ideally would benefit from bronch but O2 requirements much to high for this, and don't think given her advanced disease open lung bx a reasonable consideration.  Plan Cont current abx Check PCT  Cont current pred dosing at 50mg /d Lasix x 1 Will need close out-pt follow up Given current pred dosing not sure that repeat auto-immune testing will be of benefit Do not think she would survive FOB.   Erick Colace ACNP-BC [place] Pager # [phone] OR # [phone] if no answer  09/07/2015, 10:39 AM  Attending:  I have seen and examined the patient with nurse practitioner/resident and agree with the note above.   Ms. Covington notes worsening respiratory symptoms in the last year.  Specifically she notes dyspnea and cough productive of clear "sticky" mucus.  She notes that she will feel better prior to going home and then come back relatively quickly with these symptoms.  On exam: breathing comfortably, few crackles in lung fields, CV exam WNL; belly soft, nontender  CT images from last month reviewed: scattered ground glass most prominent in upper lobes, some bronchiectasis RUL, lower lobe  interlobular septal thickening but no honeycombing  Impression/Plan Acute respiratory failure with hypoxemia> it is unclear what is causing her underlying ILD with repeated acute flare ups; it has been treated as a steroid responsive process recently (suggesting that it may be NSIP), but the recurrence rate recently suggests something else.  I worry she may have hypersensitivity pneumonitis or recurrent aspiration pneumonitis.  She notes a moldy basement and has a known esophageal stricture. > send HP panel > order DG esophagus > continue steroids and antibiotics as written, though I think we could stop these in a few days > rest as above  Roselie Awkward, MD [place] [facility] Pager: [phone] Cell: [phone] After 3pm or if no response, call [phone]

## 2015-09-08 DIAGNOSIS — J209 Acute bronchitis, unspecified: Secondary | ICD-10-CM

## 2015-09-08 DIAGNOSIS — R1310 Dysphagia, unspecified: Secondary | ICD-10-CM

## 2015-09-08 DIAGNOSIS — J690 Pneumonitis due to inhalation of food and vomit: Secondary | ICD-10-CM

## 2015-09-08 DIAGNOSIS — E871 Hypo-osmolality and hyponatremia: Secondary | ICD-10-CM

## 2015-09-08 MED ORDER — PANTOPRAZOLE SODIUM 40 MG PO TBEC
40.0000 mg | DELAYED_RELEASE_TABLET | Freq: Every day | ORAL | Status: DC
  Administered 2015-09-08 – 2015-09-11 (×4): 40 mg via ORAL
  Filled 2015-09-08 (×4): qty 1

## 2015-09-08 MED ORDER — FUROSEMIDE 10 MG/ML IJ SOLN
20.0000 mg | Freq: Every day | INTRAMUSCULAR | Status: DC
  Administered 2015-09-08 – 2015-09-09 (×2): 20 mg via INTRAVENOUS
  Filled 2015-09-08 (×2): qty 2

## 2015-09-08 MED ORDER — PREDNISONE 20 MG PO TABS
40.0000 mg | ORAL_TABLET | Freq: Every day | ORAL | Status: DC
  Administered 2015-09-09: 40 mg via ORAL
  Filled 2015-09-08: qty 2

## 2015-09-08 NOTE — Progress Notes (Signed)
[facility] PROGRESS NOTE  [name] WNI:[id number] DOB: 25-Feb-1928 DOA: 09/05/2015 PCP: Binnie Rail, MD  Assessment/Plan: Principle problem Acute respiratory failure with hypoxia/ acute bronchitis:  -multifactorial, has ILD, Chronic hypoxic resp failure, ? Superimposed infection/aspiration -now on empiric IV vancomycin an Zosyn again and steroids-Prednsione 50mg  -all cultures negative -appreciate Pulm consult -SLP consult -Will pursue SNF this time -Wean O2  Essential hypertension:  -diovan on hold, resume lasix  Chronic hyponatremia:  Multifactorial due to heart failure and pulmonary disease.  -improved  Acute on Chronic diastolic heart failure:  -resume diuretic-lasix 20mg  Iv x1  Leukocytosis:  Multifactorial due to steroids and possible active infection.  DVT prophylaxis: on heparin.  Code Status: Full Family Communication: none at bedside Disposition Plan: SNF in 2days?   Consultants:  Pulmonary  Procedures:  none  Antibiotics: See below HPI/Subjective: Breathing about the same  Objective: Filed Vitals:   09/08/15 0448  BP: 155/54  Pulse: 73  Temp: 97.4 F (36.3 C)  Resp: 16    Intake/Output Summary (Last 24 hours) at 09/08/15 1246 Last data filed at 09/08/15 3818  Gross per 24 hour  Intake    480 ml  Output   1300 ml  Net   -820 ml   Filed Weights   09/05/15 1455 09/05/15 1620  Weight: 69.4 kg (153 lb) 69.4 kg (153 lb)    Exam:   General: No acute distress,   Head:normocephalic  Cardiovascular: S1 and S2 heard on anterior ascultation  Respiratory: Regular effort , diffuse rhonchi bilaterally  Abdomen: Soft, non-tender, non-distended  Musculoskeletal: Moves all extremities.  Skin: warm and dry  Psych: normal mood and affect.   Data Reviewed: Basic Metabolic Panel:  Recent Labs Lab 09/02/15 0510 09/03/15 1025 09/05/15 1212 09/05/15 1900 09/06/15 0539  NA 129* 134* 133*  --  137  K 5.3* 4.6 4.1  --   4.2  CL 98* 102 99*  --  104  CO2 25 27 28   --  27  GLUCOSE 134* 156* 111*  --  86  BUN 11 12 16   --  14  CREATININE 0.54 0.55 0.68 0.67 0.57  CALCIUM 9.4 9.2 9.3  --  8.9   Liver Function Tests:  Recent Labs Lab 09/05/15 1212 09/06/15 0539  AST 29 23  ALT 32 29  ALKPHOS 73 64  BILITOT 0.8 1.0  PROT 6.3* 5.5*  ALBUMIN 2.9* 2.5*   No results for input(s): LIPASE, AMYLASE in the last 168 hours. No results for input(s): AMMONIA in the last 168 hours. CBC:  Recent Labs Lab 09/05/15 1212 09/05/15 1900 09/06/15 0539  WBC 13.2* 12.1* 12.3*  NEUTROABS 11.0*  --   --   HGB 12.1 11.1* 11.0*  HCT 38.0 34.3* 34.3*  MCV 90.9 89.6 91.5  PLT 304 290 298   Cardiac Enzymes: No results for input(s): CKTOTAL, CKMB, CKMBINDEX, TROPONINI in the last 168 hours. BNP (last 3 results)  Recent Labs  08/01/15 0745 08/03/15 0600 08/31/15 1232  BNP 250.1* 278.6* 358.8*    ProBNP (last 3 results) No results for input(s): PROBNP in the last 8760 hours.  CBG: No results for input(s): GLUCAP in the last 168 hours.  Recent Results (from the past 240 hour(s))  Blood Culture (routine x 2)     Status: None (Preliminary result)   Collection Time: 09/05/15 12:10 PM  Result Value Ref Range Status   Specimen Description BLOOD LEFT ANTECUBITAL  Final   Special Requests BOTTLES DRAWN AEROBIC AND ANAEROBIC  5 CC EACH  Final   Culture   Final    NO GROWTH 2 DAYS Performed at [facility]    Report Status PENDING  Incomplete  Urine culture     Status: None   Collection Time: 09/05/15  1:00 PM  Result Value Ref Range Status   Specimen Description URINE, CATHETERIZED  Final   Special Requests NONE  Final   Culture   Final    NO GROWTH 1 DAY Performed at [facility]    Report Status 09/06/2015 FINAL  Final  Blood Culture (routine x 2)     Status: None (Preliminary result)   Collection Time: 09/05/15  5:16 PM  Result Value Ref Range Status   Specimen Description BLOOD LEFT  HAND  Final   Special Requests BOTTLES DRAWN AEROBIC ONLY 5CC AEROBIC ONLY  Final   Culture   Final    NO GROWTH 2 DAYS Performed at [facility]    Report Status PENDING  Incomplete     Studies: Dg Esophagus  09/07/2015  CLINICAL DATA:  81 year old female with dysphagia. Initial encounter. EXAM: ESOPHOGRAM/BARIUM SWALLOW TECHNIQUE: Single contrast examination was performed using  thin barium. FLUOROSCOPY TIME:  Radiation Exposure Index (as provided by the fluoroscopic device): 2 64.21 micro Gy COMPARISON:  08/31/2015 chest CT. FINDINGS: The examination was tailored to patient's requirement for high flow oxygen. Poor esophageal motility consistent with presbyesophagus. No laryngeal penetration or aspiration. Small sliding-type hiatal hernia. Mild smooth narrowing just above the hiatal hernia. A 13 mm barium tablet becomes temporarily lodged at this level and only clears with subsequent swallowing. IMPRESSION: Poor esophageal motility consistent with presbyesophagus. Small sliding-type hiatal hernia. Mild smooth narrowing just above the hiatal hernia. 13 mm barium tablet becomes temporarily lodged at this level and only clears with subsequent swallowing. Electronically Signed   By: Genia Del M.D.   On: 09/07/2015 16:05    Scheduled Meds: . aspirin EC  325 mg Oral Daily  . heparin  5,000 Units Subcutaneous 3 times per day  . ipratropium-albuterol  3 mL Nebulization Q4H  . Magnesium  200 mg Oral QHS  . pantoprazole  40 mg Oral Daily  . piperacillin-tazobactam  3.375 g Intravenous Q8H  . [START ON 09/09/2015] predniSONE  40 mg Oral Q breakfast  . saccharomyces boulardii  250 mg Oral BID  . sertraline  100 mg Oral Q breakfast  . sodium chloride  10-40 mL Intracatheter Q12H   Continuous Infusions:   Active Problems:   Essential hypertension   Chronic hyponatremia   Acute respiratory failure with hypoxia (HCC)   Acute bronchitis   Sepsis ([place])   Pyrexia   Arterial  hypotension    Time spent:21min   Domenic Polite MD [phone] [facility]  If 7PM-7AM, please contact night-coverage at [email or web address], password [facility] 09/08/2015, 12:46 PM  LOS: 3 days

## 2015-09-08 NOTE — Evaluation (Signed)
Clinical/Bedside Swallow Evaluation Patient Details  Name: [name] MRN: [id number] Date of Birth: [birth year]  Today's Date: 09/08/2015 Time: SLP Start Time (ACUTE ONLY): 1115 SLP Stop Time (ACUTE ONLY): 4098 SLP Time Calculation (min) (ACUTE ONLY): 41 min  Past Medical History:  Past Medical History  Diagnosis Date  . Dyspnea   . Diverticulosis 2008  . Internal hemorrhoids   . Esophageal stricture   . Arthritis   . Breast cancer ([place]) 1969  . GERD (gastroesophageal reflux disease)   . Gallstones   . Depression   . HLD (hyperlipidemia)   . HTN (hypertension)   . Obesity   . Colon polyp 2013    TUBULAR ADENOMA (X1  . Pulmonary fibrosis ([place])   . CAD (coronary artery disease)   . Chronic diastolic CHF (congestive heart failure) ([place]) 08/01/2015   Past Surgical History:  Past Surgical History  Procedure Laterality Date  . Abdominal hysterectomy    . Cholecystectomy    . Mastectomy      right  . Appendectomy    . Foot surgery    . Breast surgery     HPI:  81 yo female adm to [facility] with respiratory difficulties.  PMH + for ILD - fibrosis, breast cancer s/p XRT approx 45 years ago.  Swallow eval ordered by Dr Broadus John.  Pt denies significant dysphagia but does admit to having to be careful with mixed consistencies and cornbread.     Assessment / Plan / Recommendation Clinical Impression  Pt presents with functional oropharyngeal swallow ability based on clinical swallow evaluation.  NO s/s of aspiration with po observed nor indications of residuals.  SLP provided pt with reflux, xerstomia and aspiration compensations/precautions given her known issues with GERD, presbyesophagus and respiratory deficits.   Pt reviewed information in writing and reported that she already does most of the things listed.  Of note, fortunately pt reports she sensed barium tablet lodging in distal esophagus during her barium swallow.   No follow up SLP indicated as all information/education  completed.      Aspiration Risk  Mild    Diet Recommendation Age appropriate regular solids;Thin   Medication Administration: Whole meds with liquid Compensations: Small sips/bites;Slow rate    Other  Recommendations Oral Care Recommendations: Oral care BID   Follow Up Recommendations       Frequency and Duration        Pertinent Vitals/Pain Afebrile, decreased      Swallow Study Prior Functional Status  Type of Home: House Available Help at Discharge: Family;Friend(s);Available PRN/intermittently    General Date of Onset: 09/08/15 Other Pertinent Information: 82 yo female adm to [facility] with respiratory difficulties.  PMH + for ILD - fibrosis, breast cancer s/p XRT approx 45 years ago.  Swallow eval ordered by Dr Broadus John.  Pt denies significant dysphagia but does admit to having to be careful with mixed consistencies and cornbread.   Type of Study: Bedside swallow evaluation Previous Swallow Assessment: ba sw esoph dysmotility - presbyesophagus, hiatal hernia, ba tablet lodged distally 09/07/2015 Diet Prior to this Study: Regular;Thin liquids Temperature Spikes Noted: No Respiratory Status: Supplemental O2 delivered via (comment) Behavior/Cognition: Alert;Cooperative;Pleasant mood Oral Cavity - Dentition: Adequate natural dentition/normal for age Self-Feeding Abilities: Able to feed self Patient Positioning: Upright in chair/Tumbleform Baseline Vocal Quality: Normal Volitional Cough: Strong Volitional Swallow: Able to elicit    Oral/Motor/Sensory Function Overall Oral Motor/Sensory Function: Appears within functional limits for tasks assessed   Ice Chips Ice chips: Not tested  Thin Liquid Thin Liquid: Within functional limits Presentation: Cup;Self Fed    Nectar Thick Nectar Thick Liquid: Not tested   Honey Thick Honey Thick Liquid: Not tested   Puree Puree: Within functional limits Presentation: Self Fed;Spoon   Solid   GO    Solid: Within functional  limits Presentation: Self Fed;Spoon       Luanna Salk, [facility] SLP [phone]

## 2015-09-08 NOTE — Care Management Important Message (Signed)
Important Message  Patient Details  Name: AUNE ADAMI MRN: [id number] Date of Birth: 1927/11/15   Medicare Important Message Given:  Yes-second notification given    Camillo Flaming 09/08/2015, 10:54 AMImportant Message  Patient Details  Name: MANA HABERL MRN: [id number] Date of Birth: Sep 25, 1928   Medicare Important Message Given:  Yes-second notification given    Camillo Flaming 09/08/2015, 10:54 AM

## 2015-09-08 NOTE — Progress Notes (Signed)
Chaplain completed HCPOA with pt.  Copy in chart and copy with medical records to be scanned into EPIC

## 2015-09-08 NOTE — Progress Notes (Signed)
CSW received referral for New SNF.   CSW reviewed chart and noted that pt is currently on partial non-rebreather mask. SNF facilities are unable to manage non-rebreather at facility. CSW to continue to follow pt progress and assess for disposition needs when pt transitions back to nasal cannula.   CSW to continue to follow.  Alison Murray, MSW, [place] Work [phone]

## 2015-09-08 NOTE — Evaluation (Signed)
Physical Therapy Evaluation Patient Details Name: [name] MRN: [id number] DOB: Sep 03, 1928 Today's Date: 09/08/2015   History of Present Illness  81 yo female admitted with acute respiratory failure. Hx of pulmonary fibrosis, CHF, HTN. Pt is from home alone.just DC from  hospital10/29/16  Clinical Impression  Patient is very dyspneic during mobility on partial NRB mask, sats dropped to 71%. HR 101. Pt admitted with above diagnosis. Pt currently with functional limitations due to the deficits listed below (see PT Problem List).  Pt will benefit from skilled PT to increase their independence and safety with mobility to allow discharge to the venue listed below.       Follow Up Recommendations SNF;Supervision/Assistance - 24 hour    Equipment Recommendations  None recommended by PT    Recommendations for Other Services       Precautions / Restrictions Precautions Precautions: Fall Precaution Comments: O2 dep, desats Restrictions Weight Bearing Restrictions: No      Mobility  Bed Mobility Overal bed mobility: Needs Assistance Bed Mobility: Supine to Sit     Supine to sit: Min assist     General bed mobility comments: HHA to get to edge of bed  Transfers Overall transfer level: Needs assistance Equipment used: 1 person hand held assist Transfers: Sit to/from Stand Sit to Stand: Min assist         General transfer comment: requires  steady  assist for  standing and taking several steps to recliner. Unable to ambulate due to DOE. sats 71 on partial NRB. took  2 minutes to get to 90%.   Ambulation/Gait                Stairs            Wheelchair Mobility    Modified Rankin (Stroke Patients Only)       Balance Overall balance assessment: Needs assistance Sitting-balance support: Bilateral upper extremity supported Sitting balance-Leahy Scale: Good     Standing balance support: During functional activity;Single extremity supported Standing  balance-Leahy Scale: Fair Standing balance comment: very shakey and hurried due to SOB.                             Pertinent Vitals/Pain Pain Assessment: No/denies pain    Home Living Family/patient expects to be discharged to:: Private residence Living Arrangements: Alone Available Help at Discharge: Family;Friend(s);Available PRN/intermittently Type of Home: House Home Access: Stairs to enter Entrance Stairs-Rails: Right Entrance Stairs-Number of Steps: 2 Home Layout: One level Home Equipment: Walker - 2 wheels;Cane - single point;Grab bars - tub/shower;Shower seat;Toilet riser      Prior Function Level of Independence: Independent (was  just DC from hospital)               Hand Dominance        Extremity/Trunk Assessment   Upper Extremity Assessment: Generalized weakness           Lower Extremity Assessment: Generalized weakness      Cervical / Trunk Assessment: Normal  Communication   Communication: No difficulties  Cognition Arousal/Alertness: Awake/alert Behavior During Therapy: WFL for tasks assessed/performed Overall Cognitive Status: Within Functional Limits for tasks assessed                      General Comments      Exercises        Assessment/Plan    PT Assessment Patient needs continued PT services  PT Diagnosis Difficulty walking;Generalized weakness   PT Problem List Decreased strength;Decreased activity tolerance;Cardiopulmonary status limiting activity;Decreased mobility;Decreased balance  PT Treatment Interventions DME instruction;Gait training;Functional mobility training;Therapeutic activities   PT Goals (Current goals can be found in the Care Plan section) Acute Rehab PT Goals Patient Stated Goal: to get back  to my activity PT Goal Formulation: With patient Time For Goal Achievement: 09/22/15 Potential to Achieve Goals: Fair    Frequency Min 3X/week   Barriers to discharge Decreased caregiver  support      Co-evaluation               End of Session Equipment Utilized During Treatment: Oxygen;Gait belt Activity Tolerance: Treatment limited secondary to medical complications (Comment) (DOE) Patient left: in chair;with chair alarm set;with call bell/phone within reach Nurse Communication: Mobility status         Time: 0837-0901 PT Time Calculation (min) (ACUTE ONLY): 24 min   Charges:   PT Evaluation $Initial PT Evaluation Tier I: 1 Procedure PT Treatments $Therapeutic Activity: 8-22 mins   PT G Codes:        Claretha Cooper 09/08/2015, 9:17 AM Tresa Endo PT [phone]

## 2015-09-08 NOTE — Progress Notes (Signed)
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 Now re-admitted 10/30 again 3rd admit in 1 month for recurrent acute on chronic hypoxic resp failure in setting of ILD flare vs PNA.   SIGNIFICANT EVENTS    STUDIES:  10/25 CT chest: Upper lobe BTX, diffuse scattered GG changes. No sig change c/w prior CT in 2015. 11/2 Dg esophagus>>> 11/1 HSP>>>  SUBJECTIVE: feels a little better  VITAL SIGNS: Temp:  [97.4 F (36.3 C)-98.1 F (36.7 C)] 97.4 F (36.3 C) (11/02 0448) Pulse Rate:  [66-75] 73 (11/02 0448) Resp:  [16-18] 16 (11/02 0448) BP: (121-155)/(40-70) 155/54 mmHg (11/02 0448) SpO2:  [94 %-100 %] 96 % (11/02 1024) NRB -->Ward  PHYSICAL EXAMINATION: General:  Frail 81 year old female, not in acute distress, work of breathing has improved. Now up in chair Neuro:  Awake, alert, no focal def  HEENT:  NCAT, no JVD Cardiovascular:  Rrr, no MRG  Lungs: diffuse rales, no accessory muscle use at rest. Rhonchi w/ deep breath Abdomen:  Soft, + bowel sounds no OM  Musculoskeletal:  Good strength and bulk Skin:  No edema, brisk CR, warm, good pulses.    Recent Labs Lab 09/03/15 1025 09/05/15 1212 09/05/15 1900 09/06/15 0539  NA 134* 133*  --  137  K 4.6 4.1  --  4.2  CL 102 99*  --  104  CO2 27 28  --  27  BUN 12 16  --  14  CREATININE 0.55 0.68 0.67 0.57  GLUCOSE 156* 111*  --  86    Recent Labs Lab 09/05/15 1212 09/05/15 1900 09/06/15 0539  HGB 12.1 11.1* 11.0*  HCT 38.0 34.3* 34.3*  WBC 13.2* 12.1* 12.3*  PLT 304 290 298    Recent Labs Lab 09/05/15 1212 09/05/15 1223 09/05/15 1900 09/06/15 0539  WBC 13.2*  --  12.1* 12.3*  LATICACIDVEN  --   1.98  --   --    Dg Esophagus  09/07/2015  CLINICAL DATA:  81 year old female with dysphagia. Initial encounter. EXAM: ESOPHOGRAM/BARIUM SWALLOW TECHNIQUE: Single contrast examination was performed using  thin barium. FLUOROSCOPY TIME:  Radiation Exposure Index (as provided by the fluoroscopic device): 2 64.21 micro Gy COMPARISON:  08/31/2015 chest CT. FINDINGS: The examination was tailored to patient's requirement for high flow oxygen. Poor esophageal motility consistent with presbyesophagus. No laryngeal penetration or aspiration. Small sliding-type hiatal hernia. Mild smooth narrowing just above the hiatal hernia. A 13 mm barium tablet becomes temporarily lodged at this level and only clears with subsequent swallowing. IMPRESSION: Poor esophageal motility consistent with presbyesophagus. Small sliding-type hiatal hernia. Mild smooth narrowing just above the hiatal hernia. 13 mm barium tablet becomes temporarily lodged at this level and only clears with subsequent swallowing. Electronically Signed   By: Genia Del M.D.   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  Question is could this reflect hypersensitivity pneumonitis or recurrent aspiration pneumonitis.  She notes a moldy basement and has a known esophageal stricture. Plan > f/u HP panel > ordered DG esophagus > continue steroids and antibiotics, will go ahead and d/c vanc; initiate pred taper over 2 weeks >we will arrange f/w with [place] prior to [place] ACNP-BC [place] Pager # [phone] OR # [phone] if no answer

## 2015-09-09 ENCOUNTER — Telehealth: Payer: Self-pay

## 2015-09-09 DIAGNOSIS — I10 Essential (primary) hypertension: Secondary | ICD-10-CM

## 2015-09-09 LAB — CBC
HCT: 34.5 % — ABNORMAL LOW (ref 36.0–46.0)
Hemoglobin: 11 g/dL — ABNORMAL LOW (ref 12.0–15.0)
MCH: 29 pg (ref 26.0–34.0)
MCHC: 31.9 g/dL (ref 30.0–36.0)
MCV: 91 fL (ref 78.0–100.0)
PLATELETS: 346 10*3/uL (ref 150–400)
RBC: 3.79 MIL/uL — ABNORMAL LOW (ref 3.87–5.11)
RDW: 15 % (ref 11.5–15.5)
WBC: 15.5 10*3/uL — AB (ref 4.0–10.5)

## 2015-09-09 LAB — BASIC METABOLIC PANEL
Anion gap: 5 (ref 5–15)
BUN: 13 mg/dL (ref 6–20)
CALCIUM: 9.2 mg/dL (ref 8.9–10.3)
CO2: 32 mmol/L (ref 22–32)
Chloride: 98 mmol/L — ABNORMAL LOW (ref 101–111)
Creatinine, Ser: 0.69 mg/dL (ref 0.44–1.00)
GFR calc Af Amer: >60 mL/min (ref >60)
GLUCOSE: 90 mg/dL (ref 65–99)
Potassium: 4.3 mmol/L (ref 3.5–5.1)
SODIUM: 135 mmol/L (ref 135–145)

## 2015-09-09 MED ORDER — SALINE SPRAY 0.65 % NA SOLN
1.0000 | NASAL | Status: DC | PRN
  Administered 2015-09-09: 1 via NASAL
  Filled 2015-09-09: qty 44

## 2015-09-09 MED ORDER — SENNOSIDES-DOCUSATE SODIUM 8.6-50 MG PO TABS
1.0000 | ORAL_TABLET | Freq: Two times a day (BID) | ORAL | Status: DC
  Administered 2015-09-09 – 2015-09-11 (×4): 1 via ORAL
  Filled 2015-09-09 (×5): qty 1

## 2015-09-09 MED ORDER — FLUTICASONE PROPIONATE 50 MCG/ACT NA SUSP
2.0000 | Freq: Every day | NASAL | Status: DC | PRN
  Administered 2015-09-09: 2 via NASAL
  Filled 2015-09-09: qty 16

## 2015-09-09 MED ORDER — ALTEPLASE 100 MG IV SOLR
2.0000 mg | Freq: Once | INTRAVENOUS | Status: AC
  Administered 2015-09-09: 2 mg
  Filled 2015-09-09: qty 2

## 2015-09-09 MED ORDER — LEVOFLOXACIN 500 MG PO TABS
500.0000 mg | ORAL_TABLET | Freq: Every day | ORAL | Status: AC
  Administered 2015-09-09 – 2015-09-11 (×3): 500 mg via ORAL
  Filled 2015-09-09 (×3): qty 1

## 2015-09-09 MED ORDER — ALTEPLASE 2 MG IJ SOLR
2.0000 mg | Freq: Once | INTRAMUSCULAR | Status: AC
  Administered 2015-09-09: 2 mg
  Filled 2015-09-09: qty 2

## 2015-09-09 MED ORDER — POLYETHYLENE GLYCOL 3350 17 G PO PACK
17.0000 g | PACK | Freq: Two times a day (BID) | ORAL | Status: DC
  Administered 2015-09-09 – 2015-09-10 (×3): 17 g via ORAL
  Filled 2015-09-09 (×5): qty 1

## 2015-09-09 MED ORDER — IPRATROPIUM-ALBUTEROL 0.5-2.5 (3) MG/3ML IN SOLN
3.0000 mL | Freq: Four times a day (QID) | RESPIRATORY_TRACT | Status: DC
  Administered 2015-09-09 (×2): 3 mL via RESPIRATORY_TRACT
  Filled 2015-09-09 (×2): qty 3

## 2015-09-09 MED ORDER — IPRATROPIUM-ALBUTEROL 0.5-2.5 (3) MG/3ML IN SOLN
3.0000 mL | Freq: Three times a day (TID) | RESPIRATORY_TRACT | Status: DC
  Administered 2015-09-10 – 2015-09-11 (×3): 3 mL via RESPIRATORY_TRACT
  Filled 2015-09-09 (×3): qty 3
  Filled 2015-09-09: qty 6

## 2015-09-09 MED ORDER — FUROSEMIDE 40 MG PO TABS
40.0000 mg | ORAL_TABLET | Freq: Every day | ORAL | Status: DC
  Administered 2015-09-09 – 2015-09-11 (×3): 40 mg via ORAL
  Filled 2015-09-09 (×3): qty 1

## 2015-09-09 NOTE — Progress Notes (Addendum)
[facility] PROGRESS NOTE  [name] VQM:[id number] DOB: [birth year] DOA: 09/05/2015 PCP: Binnie Rail, MD  Assessment/Plan: Principle problem Acute respiratory failure with hypoxia/ acute bronchitis:  -multifactorial,possible ILD flare, Chronic hypoxic resp failure, ? Superimposed infection/aspiration -treated with empiric IV vancomycin an Zosyn again and steroids-Prednsione 50mg  being tapered-slowly -all cultures negative, change to Levaquin, FU hypersensitivity panel -appreciate Pulm consult -SLP consult appreciated,:  functional oropharyngeal swallow ability based on clinical swallow evaluation. NO s/s of aspiration with po observed nor indications of residuals -SNF tomorrow if stable -Wean O2  Essential hypertension:  -diovan on hold, resumed lasix  Chronic hyponatremia:  Multifactorial due to heart failure and pulmonary disease.  -improved  Acute on Chronic diastolic heart failure:  -resume diuretic-lasix 20mg  Iv x1 11/2 -change to PO lasix  Leukocytosis:  Multifactorial due to steroids and possible active infection.  DVT prophylaxis: on heparin.  Code Status: Full Family Communication: none at bedside Disposition Plan: SNF tomorrow ?  Consultants:  Pulmonary  Procedures:  none  Antibiotics: See below HPI/Subjective: Breathing improving, cough better  Objective: Filed Vitals:   09/09/15 0900  BP: 126/38  Pulse: 83  Temp: 97.9 F (36.6 C)  Resp: 14    Intake/Output Summary (Last 24 hours) at 09/09/15 1157 Last data filed at 09/09/15 1101  Gross per 24 hour  Intake    500 ml  Output   2325 ml  Net  -1825 ml   Filed Weights   09/05/15 1455 09/05/15 1620  Weight: 69.4 kg (153 lb) 69.4 kg (153 lb)    Exam:   General: No acute distress,   Head:normocephalic  Cardiovascular: S1 and S2 heard on anterior ascultation  Respiratory: Regular effort , diffuse rhonchi bilaterally  Abdomen: Soft, non-tender,  non-distended  Musculoskeletal: Moves all extremities.  Skin: warm and dry  Psych: normal mood and affect.   Data Reviewed: Basic Metabolic Panel:  Recent Labs Lab 09/03/15 1025 09/05/15 1212 09/05/15 1900 09/06/15 0539 09/09/15 0615  NA 134* 133*  --  137 135  K 4.6 4.1  --  4.2 4.3  CL 102 99*  --  104 98*  CO2 27 28  --  27 32  GLUCOSE 156* 111*  --  86 90  BUN 12 16  --  14 13  CREATININE 0.55 0.68 0.67 0.57 0.69  CALCIUM 9.2 9.3  --  8.9 9.2   Liver Function Tests:  Recent Labs Lab 09/05/15 1212 09/06/15 0539  AST 29 23  ALT 32 29  ALKPHOS 73 64  BILITOT 0.8 1.0  PROT 6.3* 5.5*  ALBUMIN 2.9* 2.5*   No results for input(s): LIPASE, AMYLASE in the last 168 hours. No results for input(s): AMMONIA in the last 168 hours. CBC:  Recent Labs Lab 09/05/15 1212 09/05/15 1900 09/06/15 0539 09/09/15 0615  WBC 13.2* 12.1* 12.3* 15.5*  NEUTROABS 11.0*  --   --   --   HGB 12.1 11.1* 11.0* 11.0*  HCT 38.0 34.3* 34.3* 34.5*  MCV 90.9 89.6 91.5 91.0  PLT 304 290 298 346   Cardiac Enzymes: No results for input(s): CKTOTAL, CKMB, CKMBINDEX, TROPONINI in the last 168 hours. BNP (last 3 results)  Recent Labs  08/01/15 0745 08/03/15 0600 08/31/15 1232  BNP 250.1* 278.6* 358.8*    ProBNP (last 3 results) No results for input(s): PROBNP in the last 8760 hours.  CBG: No results for input(s): GLUCAP in the last 168 hours.  Recent Results (from the past 240 hour(s))  Blood Culture (  routine x 2)     Status: None (Preliminary result)   Collection Time: 09/05/15 12:10 PM  Result Value Ref Range Status   Specimen Description BLOOD LEFT ANTECUBITAL  Final   Special Requests BOTTLES DRAWN AEROBIC AND ANAEROBIC 5 CC EACH  Final   Culture   Final    NO GROWTH 3 DAYS Performed at [facility]    Report Status PENDING  Incomplete  Urine culture     Status: None   Collection Time: 09/05/15  1:00 PM  Result Value Ref Range Status   Specimen Description  URINE, CATHETERIZED  Final   Special Requests NONE  Final   Culture   Final    NO GROWTH 1 DAY Performed at [facility]    Report Status 09/06/2015 FINAL  Final  Blood Culture (routine x 2)     Status: None (Preliminary result)   Collection Time: 09/05/15  5:16 PM  Result Value Ref Range Status   Specimen Description BLOOD LEFT HAND  Final   Special Requests BOTTLES DRAWN AEROBIC ONLY 5CC AEROBIC ONLY  Final   Culture   Final    NO GROWTH 3 DAYS Performed at [facility]    Report Status PENDING  Incomplete     Studies: Dg Esophagus  09/07/2015  CLINICAL DATA:  81 year old female with dysphagia. Initial encounter. EXAM: ESOPHOGRAM/BARIUM SWALLOW TECHNIQUE: Single contrast examination was performed using  thin barium. FLUOROSCOPY TIME:  Radiation Exposure Index (as provided by the fluoroscopic device): 2 64.21 micro Gy COMPARISON:  08/31/2015 chest CT. FINDINGS: The examination was tailored to patient's requirement for high flow oxygen. Poor esophageal motility consistent with presbyesophagus. No laryngeal penetration or aspiration. Small sliding-type hiatal hernia. Mild smooth narrowing just above the hiatal hernia. A 13 mm barium tablet becomes temporarily lodged at this level and only clears with subsequent swallowing. IMPRESSION: Poor esophageal motility consistent with presbyesophagus. Small sliding-type hiatal hernia. Mild smooth narrowing just above the hiatal hernia. 13 mm barium tablet becomes temporarily lodged at this level and only clears with subsequent swallowing. Electronically Signed   By: Genia Del M.D.   On: 09/07/2015 16:05    Scheduled Meds: . aspirin EC  325 mg Oral Daily  . furosemide  20 mg Intravenous Daily  . heparin  5,000 Units Subcutaneous 3 times per day  . ipratropium-albuterol  3 mL Nebulization Q6H WA  . levofloxacin  500 mg Oral Daily  . Magnesium  200 mg Oral QHS  . pantoprazole  40 mg Oral Daily  . polyethylene glycol  17 g Oral BID   . predniSONE  40 mg Oral Q breakfast  . saccharomyces boulardii  250 mg Oral BID  . senna-docusate  1 tablet Oral BID  . sertraline  100 mg Oral Q breakfast  . sodium chloride  10-40 mL Intracatheter Q12H   Continuous Infusions:   Active Problems:   Essential hypertension   Chronic hyponatremia   Acute respiratory failure with hypoxia (HCC)   Acute bronchitis   Sepsis ([place])   Pyrexia   Arterial hypotension   Dysphagia    Time spent:16min   Domenic Polite MD [phone] [facility]  If 7PM-7AM, please contact night-coverage at [email or web address], password [email or web address] 09/09/2015, 11:57 AM  LOS: 4 days

## 2015-09-09 NOTE — Progress Notes (Signed)
BSW Intern follow-up with pt regarding bed offers from [facility] search.  BSW Intern met w/ pt at bedside. Pt reported feeling weak and stated she did not think she would be ready for dc tomorrow. BSW Intern provided support and explained preparing for dc and short-term rehab.  BSW Intern provided pt with list of bed offers. Pt to discuss w/ her brother regarding choices later today. CSW to follow-up regarding decision.   CSW to continue to provide support to pt.  Harlon Flor, [place] Intern Clinical Social Work Department  [phone]

## 2015-09-09 NOTE — Clinical Social Work Note (Signed)
Clinical Social Work Assessment  Patient Details  Name: [name] MRN: [id number] Date of Birth: September 14, 1928  Date of referral:  09/09/15               Reason for consult:  Facility Placement                Permission sought to share information with:    Permission granted to share information::     Name::        Agency::     Relationship::     Contact Information:     Housing/Transportation Living arrangements for the past 2 months:  Single Family Home Source of Information:  Patient Patient Interpreter Needed:  None Criminal Activity/Legal Involvement Pertinent to Current Situation/Hospitalization:  No - Comment as needed Significant Relationships:  Siblings Lives with:  Self Do you feel safe going back to the place where you live?  No Need for family participation in patient care:  No (Coment)  Care giving concerns:  PT recommends pt for short-term rehab at a SNF.  Pt lives at home alone and has been readmitted to the hospital within the last 30 days.   Social Worker assessment / plan:  PT is recommending short-term rehab for pt at a SNF.   BSW Intern met with pt at bedside. Pt lives at home alone and has been readmitted to the hospital within the last 30 days. Pt is agreeable to short-term rehab at SNF.   Pt discussed pt family lives in [place] and that they will make the decision on which SNF pt will go to due to the convienence of location. Pt brother is coming by later today to discuss SNF with pt.   Pt expressed feelings around readmission and discussed her prescription was not filled correctly upon returning home. Pt believes she received the wrong dosage. Pt blames dosage on readmission to hospital. BSW Intern provided support.  BSW Intern to send SNF search to [facility]. BSW Intern to provide bed offers to pt when received.  BSW Intern to continue to follow and provide support to pt and pt family.  Employment status:  Retired Forensic scientist:   Medicare PT Recommendations:  [place] / Referral to community resources:  [place]  Patient/Family's Response to care:  Pt alert and oriented x4. Pt expressed frustration regarding readmission and prior medication dosage.   Patient/Family's Understanding of and Emotional Response to Diagnosis, Current Treatment, and Prognosis:  Pt is understanding and is responding appropriately to diagnosis and treatment.   Emotional Assessment Appearance:  Appears younger than stated age Attitude/Demeanor/Rapport:  Other (Appropriate ) Affect (typically observed):  Adaptable, Accepting Orientation:  Oriented to Self, Oriented to Place, Oriented to  Time, Oriented to Situation Alcohol / Substance use:  Not Applicable Psych involvement (Current and /or in the community):  No (Comment)  Discharge Needs  Concerns to be addressed:  Discharge Planning Concerns Readmission within the last 30 days:  Yes Current discharge risk:  None Barriers to Discharge:  Continued Medical Work up   [employer], Student-SW 09/09/2015, 9:38 AM

## 2015-09-09 NOTE — NC FL2 (Deleted)
[place] LEVEL OF CARE SCREENING TOOL     IDENTIFICATION  Patient Name: [name] Birthdate: 11/15/1927 Sex: female Admission Date (Current Location): 09/05/2015  [facility] and [place] Number: Herbalist and Address:  [facility]  [place] [address], [place]      Provider Number: [id number]  Attending Physician Name and Address:  Domenic Polite, MD  Relative Name and Phone Number:       Current Level of Care: Hospital Recommended Level of Care: [place] Prior Approval Number:    Date Approved/Denied:   PASRR Number:    Discharge Plan: SNF    Current Diagnoses: Patient Active Problem List   Diagnosis Date Noted  . Dysphagia   . Pyrexia   . Arterial hypotension   . Sepsis ([place]) 09/05/2015  . Palliative care encounter 09/03/2015  . DNR (do not resuscitate) 09/03/2015  . Bronchitis, chronic obstructive w acute bronchitis ([place]) 09/02/2015  . Acute bronchitis   . HCAP (healthcare-associated pneumonia) 09/01/2015  . Dyspnea 08/31/2015  . Acute respiratory failure with hypoxia ([place]) 08/31/2015  . Hypoxia   . Pain   . Depression 08/22/2015  . Chronic hyponatremia 08/03/2015  . Pulmonary fibrosis ([place]) 08/01/2015  . Chronic diastolic CHF (congestive heart failure) ([place]) 08/01/2015  . SOB (shortness of breath) 08/01/2015  . Exercise hypoxemia 07/28/2015  . Obesity 07/01/2015  . Hyperlipidemia 09/17/2014  . Coronary artery disease 08/17/2014  . Leukocytosis 08/17/2014  . Protein-calorie malnutrition ([place]) 08/17/2014  . Postinflammatory pulmonary fibrosis ([place]) 08/16/2014  . Change in bowel habits 07/24/2012  . Special screening for malignant neoplasms, colon 12/08/2011  . Stricture and stenosis of esophagus 12/08/2011  . NEOPLASM, MALIGNANT, RIGHT BREAST 11/28/2010  . OBSTRUCTIVE SLEEP APNEA 11/28/2010  . Essential hypertension 11/28/2010  . ALLERGIC RHINITIS 11/28/2010  . GERD 11/28/2010     Orientation ACTIVITIES/SOCIAL BLADDER RESPIRATION    Self, Time, Situation, Place  Active Continent O2 (As needed) (4 L/min continuous )  BEHAVIORAL SYMPTOMS/MOOD NEUROLOGICAL BOWEL NUTRITION STATUS      Continent Diet (Diet Heart)  PHYSICIAN VISITS COMMUNICATION OF NEEDS Height & Weight Skin    Verbally 5\' 1"  (154.9 cm) 153 lbs. Normal          AMBULATORY STATUS RESPIRATION    Assist extensive (+1 for transfers) O2 (As needed) (4 L/min continuous )      Personal Care Assistance Level of Assistance  Bathing, Dressing, Feeding Bathing Assistance: Limited assistance Feeding assistance: Independent Dressing Assistance: Limited assistance      Functional Limitations Info  Sight, Hearing, Speech Sight Info: Impaired Hearing Info: Adequate Speech Info: Adequate       SPECIAL CARE FACTORS FREQUENCY  PT (By licensed PT)     PT Frequency: 5 x week             Additional Factors Info  Code Status, Allergies, Psychotropic Code Status Info: DNR code status Allergies Info: Anesthetics, Amide, Codeine, Darvocet, Antihistamines, Loratadine-type, Moxifloxacin Psychotropic Info: Zoloft         Current Medications (09/09/2015): Current Facility-Administered Medications  Medication Dose Route Frequency Provider Last Rate Last Dose  . acetaminophen (TYLENOL) tablet 1,000 mg  1,000 mg Oral Q6H PRN Charlynne Cousins, MD      . alteplase (ACTIVASE) injection 2 mg  2 mg Intracatheter Once Domenic Polite, MD      . alteplase (CATHFLO ACTIVASE) injection 2 mg  2 mg Intracatheter Once Domenic Polite, MD      .  aspirin EC tablet 325 mg  325 mg Oral Daily Charlynne Cousins, MD   325 mg at 09/09/15 0955  . benzonatate (TESSALON) capsule 200 mg  200 mg Oral TID PRN Charlynne Cousins, MD   200 mg at 09/08/15 2153  . furosemide (LASIX) injection 20 mg  20 mg Intravenous Daily Domenic Polite, MD   20 mg at 09/09/15 0954  . heparin injection 5,000 Units  5,000 Units Subcutaneous 3  times per day Charlynne Cousins, MD   5,000 Units at 09/09/15 0542  . ipratropium-albuterol (DUONEB) 0.5-2.5 (3) MG/3ML nebulizer solution 3 mL  3 mL Nebulization Q6H WA Domenic Polite, MD      . levofloxacin [facility]) tablet 500 mg  500 mg Oral Daily Domenic Polite, MD      . Magnesium TABS 200 mg  200 mg Oral QHS Charlynne Cousins, MD   200 mg at 09/08/15 2153  . pantoprazole (PROTONIX) EC tablet 40 mg  40 mg Oral Daily Domenic Polite, MD   40 mg at 09/09/15 0825  . polyethylene glycol (MIRALAX / GLYCOLAX) packet 17 g  17 g Oral BID Domenic Polite, MD   17 g at 09/09/15 0954  . predniSONE (DELTASONE) tablet 40 mg  40 mg Oral Q breakfast Erick Colace, NP   40 mg at 09/09/15 0825  . saccharomyces boulardii (FLORASTOR) capsule 250 mg  250 mg Oral BID Charlynne Cousins, MD   250 mg at 09/09/15 0954  . senna-docusate (Senokot-S) tablet 1 tablet  1 tablet Oral BID Domenic Polite, MD   1 tablet at 09/09/15 0954  . sertraline (ZOLOFT) tablet 100 mg  100 mg Oral Q breakfast Charlynne Cousins, MD   100 mg at 09/09/15 0825  . sodium chloride 0.9 % injection 10-40 mL  10-40 mL Intracatheter Q12H Charlynne Cousins, MD   10 mL at 09/09/15 0957  . sodium chloride 0.9 % injection 10-40 mL  10-40 mL Intracatheter PRN Charlynne Cousins, MD      . traMADol Veat[facility] Bourbon) tablet 50 mg  50 mg Oral Q6H PRN Charlynne Cousins, MD       Do not use this list as official medication orders. Please verify with discharge summary.  Discharge Medications:   Medication List    ASK your doctor about these medications        acetaminophen 650 MG CR tablet  Commonly known as:  TYLENOL  Take 1,300 mg by mouth every 8 (eight) hours as needed for pain.     aspirin 325 MG EC tablet  Take 1 tablet (325 mg total) by mouth daily.     benzonatate 200 MG capsule  Commonly known as:  TESSALON  Take 1 capsule (200 mg total) by mouth 3 (three) times daily as needed for cough.     doxycycline 100 MG tablet  Commonly  known as:  VIBRA-TABS  Take 1 tablet (100 mg total) by mouth every 12 (twelve) hours.     esomeprazole 20 MG capsule  Commonly known as:  NEXIUM  Take 20 mg by mouth 2 (two) times daily.     fluticasone 50 MCG/ACT nasal spray  Commonly known as:  FLONASE  Place 2 sprays into both nostrils once as needed for allergies or rhinitis.     furosemide 20 MG tablet  Commonly known as:  LASIX  Take 20 mg by mouth daily as needed for fluid.     ipratropium-albuterol 0.5-2.5 (3) MG/3ML Soln  Commonly known  as:  DUONEB  Take 3 mLs by nebulization every 6 (six) hours as needed.     Magnesium 250 MG Tabs  Take 250 mg by mouth at bedtime.     MELATONIN PO  Take 1 tablet by mouth at bedtime.     predniSONE 10 MG tablet  Commonly known as:  DELTASONE  Takes 6 tablets for 2 days, then 5 tablets for 2 days, then 4 tablets for 2 days, then 3 tablets for 2 days, then 2 tabs daily     sertraline 100 MG tablet  Commonly known as:  ZOLOFT  Take 100 mg by mouth daily with breakfast.     sodium chloride 0.65 % Soln nasal spray  Commonly known as:  OCEAN  Place 1 spray into both nostrils 2 (two) times daily.     traMADol 50 MG tablet  Commonly known as:  ULTRAM  Take 50 mg by mouth every 6 (six) hours as needed for moderate pain.     valsartan 320 MG tablet  Commonly known as:  DIOVAN  Take 1 tablet by mouth daily with breakfast.     Vitamin D 2000 UNITS tablet  Take 2,000 Units by mouth at bedtime.        Relevant Imaging Results:  Relevant Lab Results:  Recent Labs    Additional Information SSN: [id number]  Harlon Flor, Student-SW [phone]

## 2015-09-09 NOTE — Progress Notes (Signed)
PT Cancellation Note  Patient Details Name: [name] MRN: [id number] DOB: Mar 11, 1928   Cancelled Treatment:    Reason Eval/Treat Not Completed: Attempted tx session-pt declined to participate. Will check back another day.    Weston Anna, MPT Pager: [phone]

## 2015-09-09 NOTE — Progress Notes (Signed)
Discussed case with Dr. Broadus John.  Follow up appointment arranged for 11/18 with Dr. Melvyn Novas.  Remain on 30 mg prednisone x1 week then decrease to 20 mg until seen by Dr. Melvyn Novas.  Transition to oral levaquin for discharge.  Follow up hypersensitivity pneumonitis panel with Dr. Melvyn Novas.     Noe Gens, NP-C [facility] Pgr: [phone] or if no answer [phone] 09/09/2015, 9:53 AM

## 2015-09-09 NOTE — NC FL2 (Signed)
[place] LEVEL OF CARE SCREENING TOOL     IDENTIFICATION  Patient Name: [name] Birthdate: 09-10-1928 Sex: female Admission Date (Current Location): 09/05/2015  [facility] and [place] Number: Herbalist and Address:  [facility],  [place] [address], [place]      Provider Number: [id number]  Attending Physician Name and Address:  Domenic Polite, MD  Relative Name and Phone Number:       Current Level of Care: Hospital Recommended Level of Care: [place] Prior Approval Number:    Date Approved/Denied:   PASRR Number: [id number]  Discharge Plan: SNF    Current Diagnoses: Patient Active Problem List   Diagnosis Date Noted  . Dysphagia   . Pyrexia   . Arterial hypotension   . Sepsis ([place]) 09/05/2015  . Palliative care encounter 09/03/2015  . DNR (do not resuscitate) 09/03/2015  . Bronchitis, chronic obstructive w acute bronchitis ([place]) 09/02/2015  . Acute bronchitis   . HCAP (healthcare-associated pneumonia) 09/01/2015  . Dyspnea 08/31/2015  . Acute respiratory failure with hypoxia ([place]) 08/31/2015  . Hypoxia   . Pain   . Depression 08/22/2015  . Chronic hyponatremia 08/03/2015  . Pulmonary fibrosis ([place]) 08/01/2015  . Chronic diastolic CHF (congestive heart failure) ([place]) 08/01/2015  . SOB (shortness of breath) 08/01/2015  . Exercise hypoxemia 07/28/2015  . Obesity 07/01/2015  . Hyperlipidemia 09/17/2014  . Coronary artery disease 08/17/2014  . Leukocytosis 08/17/2014  . Protein-calorie malnutrition ([place]) 08/17/2014  . Postinflammatory pulmonary fibrosis ([place]) 08/16/2014  . Change in bowel habits 07/24/2012  . Special screening for malignant neoplasms, colon 12/08/2011  . Stricture and stenosis of esophagus 12/08/2011  . NEOPLASM, MALIGNANT, RIGHT BREAST 11/28/2010  . OBSTRUCTIVE SLEEP APNEA 11/28/2010  . Essential hypertension 11/28/2010  . ALLERGIC RHINITIS 11/28/2010  . GERD 11/28/2010     Orientation ACTIVITIES/SOCIAL BLADDER RESPIRATION    Self, Time, Situation, Place  Active Continent O2 (As needed) (4 L/min continuous )  BEHAVIORAL SYMPTOMS/MOOD NEUROLOGICAL BOWEL NUTRITION STATUS      Continent Diet (Diet Heart)  PHYSICIAN VISITS COMMUNICATION OF NEEDS Height & Weight Skin    Verbally 5\' 1"  (154.9 cm) 153 lbs. Normal          AMBULATORY STATUS RESPIRATION    Assist extensive (+1 for transfers) O2 (As needed) (4 L/min continuous )      Personal Care Assistance Level of Assistance  Bathing, Dressing, Feeding Bathing Assistance: Limited assistance Feeding assistance: Independent Dressing Assistance: Limited assistance      Functional Limitations Info  Sight, Hearing, Speech Sight Info: Impaired Hearing Info: Adequate Speech Info: Adequate       SPECIAL CARE FACTORS FREQUENCY  PT (By licensed PT)     PT Frequency: 5 x week             Additional Factors Info  Code Status, Allergies, Psychotropic Code Status Info: DNR code status Allergies Info: Anesthetics, Amide, Codeine, Darvocet, Antihistamines, Loratadine-type, Moxifloxacin Psychotropic Info: Zoloft         Current Medications (09/09/2015): Current Facility-Administered Medications  Medication Dose Route Frequency Provider Last Rate Last Dose  . acetaminophen (TYLENOL) tablet 1,000 mg  1,000 mg Oral Q6H PRN Charlynne Cousins, MD      . aspirin EC tablet 325 mg  325 mg Oral Daily Charlynne Cousins, MD   325 mg at 09/09/15 0955  . benzonatate (TESSALON) capsule 200 mg  200 mg Oral TID PRN Charlynne Cousins, MD  200 mg at 09/08/15 2153  . furosemide (LASIX) tablet 40 mg  40 mg Oral Daily Domenic Polite, MD      . heparin injection 5,000 Units  5,000 Units Subcutaneous 3 times per day Charlynne Cousins, MD   5,000 Units at 09/09/15 0542  . ipratropium-albuterol (DUONEB) 0.5-2.5 (3) MG/3ML nebulizer solution 3 mL  3 mL Nebulization Q6H WA Domenic Polite, MD      . levofloxacin  [facility]) tablet 500 mg  500 mg Oral Daily Domenic Polite, MD   500 mg at 09/09/15 1229  . Magnesium TABS 200 mg  200 mg Oral QHS Charlynne Cousins, MD   200 mg at 09/08/15 2153  . pantoprazole (PROTONIX) EC tablet 40 mg  40 mg Oral Daily Domenic Polite, MD   40 mg at 09/09/15 0825  . polyethylene glycol (MIRALAX / GLYCOLAX) packet 17 g  17 g Oral BID Domenic Polite, MD   17 g at 09/09/15 0954  . predniSONE (DELTASONE) tablet 40 mg  40 mg Oral Q breakfast Erick Colace, NP   40 mg at 09/09/15 0825  . saccharomyces boulardii (FLORASTOR) capsule 250 mg  250 mg Oral BID Charlynne Cousins, MD   250 mg at 09/09/15 0954  . senna-docusate (Senokot-S) tablet 1 tablet  1 tablet Oral BID Domenic Polite, MD   1 tablet at 09/09/15 0954  . sertraline (ZOLOFT) tablet 100 mg  100 mg Oral Q breakfast Charlynne Cousins, MD   100 mg at 09/09/15 0825  . sodium chloride 0.9 % injection 10-40 mL  10-40 mL Intracatheter Q12H Charlynne Cousins, MD   10 mL at 09/09/15 0957  . sodium chloride 0.9 % injection 10-40 mL  10-40 mL Intracatheter PRN Charlynne Cousins, MD      . traMADol Veatrice Bourbon) tablet 50 mg  50 mg Oral Q6H PRN Charlynne Cousins, MD       Do not use this list as official medication orders. Please verify with discharge summary.  Discharge Medications:   Medication List    ASK your doctor about these medications        acetaminophen 650 MG CR tablet  Commonly known as:  TYLENOL  Take 1,300 mg by mouth every 8 (eight) hours as needed for pain.     aspirin 325 MG EC tablet  Take 1 tablet (325 mg total) by mouth daily.     benzonatate 200 MG capsule  Commonly known as:  TESSALON  Take 1 capsule (200 mg total) by mouth 3 (three) times daily as needed for cough.     doxycycline 100 MG tablet  Commonly known as:  VIBRA-TABS  Take 1 tablet (100 mg total) by mouth every 12 (twelve) hours.     esomeprazole 20 MG capsule  Commonly known as:  NEXIUM  Take 20 mg by mouth 2 (two) times daily.      fluticasone 50 MCG/ACT nasal spray  Commonly known as:  FLONASE  Place 2 sprays into both nostrils once as needed for allergies or rhinitis.     furosemide 20 MG tablet  Commonly known as:  LASIX  Take 20 mg by mouth daily as needed for fluid.     ipratropium-albuterol 0.5-2.5 (3) MG/3ML Soln  Commonly known as:  DUONEB  Take 3 mLs by nebulization every 6 (six) hours as needed.     Magnesium 250 MG Tabs  Take 250 mg by mouth at bedtime.     MELATONIN PO  Take 1  tablet by mouth at bedtime.     predniSONE 10 MG tablet  Commonly known as:  DELTASONE  Takes 6 tablets for 2 days, then 5 tablets for 2 days, then 4 tablets for 2 days, then 3 tablets for 2 days, then 2 tabs daily     sertraline 100 MG tablet  Commonly known as:  ZOLOFT  Take 100 mg by mouth daily with breakfast.     sodium chloride 0.65 % Soln nasal spray  Commonly known as:  OCEAN  Place 1 spray into both nostrils 2 (two) times daily.     traMADol 50 MG tablet  Commonly known as:  ULTRAM  Take 50 mg by mouth every 6 (six) hours as needed for moderate pain.     valsartan 320 MG tablet  Commonly known as:  DIOVAN  Take 1 tablet by mouth daily with breakfast.     Vitamin D 2000 UNITS tablet  Take 2,000 Units by mouth at bedtime.        Relevant Imaging Results:  Relevant Lab Results:  Recent Labs    Additional Information SSN: [id number]  Harlon Flor, Student-SW [phone]

## 2015-09-09 NOTE — Consult Note (Signed)
   [facility] CM Inpatient Consult   09/09/2015  [name] 11/19/1927 [id number]   Patient evaluated for [facility] services. Confirmed discharge plans are for SNF per inpatient Licensed CSW and inpatient RNCM. Will not engage at this time for [facility] services.  Marthenia Rolling, MSN-Ed, RN,BSN [facility] Liaison [phone]

## 2015-09-09 NOTE — Telephone Encounter (Signed)
Pt scheduled to see Dr. Silverio Decamp 10/04/15@1 :30pm.    This pt needs to be scheduled with [facility] for eval of esophageal dysmotility perhaps leading to micro-aspiration and issues with ILD (Interstitial lung dx)    jmp        ----- Message -----     From: Juanito Doom, MD     Sent: 09/08/2015 12:25 PM      To: Jerene Bears, MD, Tanda Rockers, MD

## 2015-09-09 NOTE — Clinical Social Work Placement (Signed)
   CLINICAL SOCIAL WORK PLACEMENT  NOTE  Date:  09/09/2015  Patient Details  Name: JANEANN PAISLEY MRN: [id number] Date of Birth: [birth year]  Clinical Social Work is seeking post-discharge placement for this patient at the [place] level of care (*CSW will initial, date and re-position this form in  chart as items are completed):  Yes   Patient/family provided with [place] Work Department's list of facilities offering this level of care within the geographic area requested by the patient (or if unable, by the patient's family).  Yes   Patient/family informed of their freedom to choose among providers that offer the needed level of care, that participate in Medicare, Medicaid or managed care program needed by the patient, have an available bed and are willing to accept the patient.  Yes   Patient/family informed of 's ownership interest in [facility] and [facility], as well as of the fact that they are under no obligation to receive care at these facilities.  PASRR submitted to EDS on 09/09/15     PASRR number received on       Existing PASRR number confirmed on       FL2 transmitted to all facilities in geographic area requested by pt/family on 09/09/15     FL2 transmitted to all facilities within larger geographic area on       Patient informed that his/her managed care company has contracts with or will negotiate with certain facilities, including the following:        No   Patient/family informed of bed offers received.  Patient chooses bed at       Physician recommends and patient chooses bed at      Patient to be transferred to   on  .  Patient to be transferred to facility by       Patient family notified on   of transfer.  Name of family member notified:        PHYSICIAN Please sign FL2, Please sign DNR     Additional Comment:    _______________________________________________ Harlon Flor,  Student-SW 09/09/2015, 10:55 AM

## 2015-09-10 ENCOUNTER — Encounter: Payer: Medicare Other | Admitting: Cardiology

## 2015-09-10 ENCOUNTER — Ambulatory Visit: Payer: Medicare Other | Admitting: Gastroenterology

## 2015-09-10 LAB — CBC
HCT: 34.5 % — ABNORMAL LOW (ref 36.0–46.0)
Hemoglobin: 11.1 g/dL — ABNORMAL LOW (ref 12.0–15.0)
MCH: 29.2 pg (ref 26.0–34.0)
MCHC: 32.2 g/dL (ref 30.0–36.0)
MCV: 90.8 fL (ref 78.0–100.0)
PLATELETS: 331 10*3/uL (ref 150–400)
RBC: 3.8 MIL/uL — ABNORMAL LOW (ref 3.87–5.11)
RDW: 14.9 % (ref 11.5–15.5)
WBC: 15 10*3/uL — ABNORMAL HIGH (ref 4.0–10.5)

## 2015-09-10 LAB — BASIC METABOLIC PANEL
Anion gap: 7 (ref 5–15)
BUN: 15 mg/dL (ref 6–20)
CO2: 29 mmol/L (ref 22–32)
CREATININE: 0.68 mg/dL (ref 0.44–1.00)
Calcium: 9.3 mg/dL (ref 8.9–10.3)
Chloride: 99 mmol/L — ABNORMAL LOW (ref 101–111)
GFR calc Af Amer: >60 mL/min (ref >60)
GLUCOSE: 107 mg/dL — AB (ref 65–99)
Potassium: 4.2 mmol/L (ref 3.5–5.1)
SODIUM: 135 mmol/L (ref 135–145)

## 2015-09-10 LAB — HYPERSENSITIVITY PNEUMONITIS
A. FUMIGATUS #1 ABS: NEGATIVE
A. PULLULANS ABS: NEGATIVE
MICROPOLYSPORA FAENI IGG: NEGATIVE
Pigeon Serum Abs: NEGATIVE
THERMOACT. SACCHARII: NEGATIVE
THERMOACTINOMYCES VULGARIS IGG: NEGATIVE

## 2015-09-10 LAB — CULTURE, BLOOD (ROUTINE X 2)
CULTURE: NO GROWTH
Culture: NO GROWTH

## 2015-09-10 MED ORDER — PREDNISONE 5 MG PO TABS
30.0000 mg | ORAL_TABLET | Freq: Every day | ORAL | Status: DC
  Administered 2015-09-10 – 2015-09-11 (×2): 30 mg via ORAL
  Filled 2015-09-10: qty 2
  Filled 2015-09-10: qty 1
  Filled 2015-09-10: qty 2
  Filled 2015-09-10: qty 1

## 2015-09-10 MED ORDER — SENNOSIDES-DOCUSATE SODIUM 8.6-50 MG PO TABS: 1.0000 | ORAL_TABLET | Freq: Every evening | ORAL | Status: DC | PRN

## 2015-09-10 MED ORDER — PREDNISONE 10 MG PO TABS: ORAL_TABLET | ORAL | Status: DC

## 2015-09-10 MED ORDER — LEVOFLOXACIN 500 MG PO TABS: 500.0000 mg | ORAL_TABLET | Freq: Every day | ORAL | Status: DC

## 2015-09-10 MED ORDER — POLYETHYLENE GLYCOL 3350 17 G PO PACK: 17.0000 g | PACK | Freq: Every day | ORAL | Status: AC | PRN

## 2015-09-10 MED ORDER — FUROSEMIDE 20 MG PO TABS: 40.0000 mg | ORAL_TABLET | Freq: Every day | ORAL | Status: DC

## 2015-09-10 NOTE — Progress Notes (Signed)
CSW continuing to follow.   CSW received phone call from pt brother, Clair Gulling stating that he received bed offers and was interested in [facility] which had not yet responded.   CSW contacted [facility] and had facility review referral. [facility] confirmed that facility can offer pt a bed and can accept pt tomorrow. [facility] requested preliminary discharge summary. CSW notified MD and faxed preliminary discharge summary to [facility].  CSW notified pt at bedside and pt brother, Clair Gulling via telephone regarding [facility] offering bed and pt and pt brother are agreeable to [place] [facility] for pt rehab needs.   Per MD, plan for discharge to [facility] tomorrow.   Weekend CSW to facilitate pt discharge needs.   Alison Murray, MSW, [facility] [phone]

## 2015-09-10 NOTE — Clinical Social Work Placement (Signed)
   CLINICAL SOCIAL WORK PLACEMENT  NOTE  Date:  09/10/2015  Patient Details  Name: [name] MRN: [id number] Date of Birth: 07/27/1928  Clinical Social Work is seeking post-discharge placement for this patient at the [place] level of care (*CSW will initial, date and re-position this form in  chart as items are completed):  Yes   Patient/family provided with [place] Work Department's list of facilities offering this level of care within the geographic area requested by the patient (or if unable, by the patient's family).  Yes   Patient/family informed of their freedom to choose among providers that offer the needed level of care, that participate in Medicare, Medicaid or managed care program needed by the patient, have an available bed and are willing to accept the patient.  Yes   Patient/family informed of 's ownership interest in [facility] and [facility], as well as of the fact that they are under no obligation to receive care at these facilities.  PASRR submitted to EDS on 09/09/15     PASRR number received on       Existing PASRR number confirmed on       FL2 transmitted to all facilities in geographic area requested by pt/family on 09/09/15     FL2 transmitted to all facilities within larger geographic area on       Patient informed that his/her managed care company has contracts with or will negotiate with certain facilities, including the following:        Yes   Patient/family informed of bed offers received.  Patient chooses bed at [facility]     Physician recommends and patient chooses bed at      Patient to be transferred to [facility] on 09/11/15.  Patient to be transferred to facility by       Patient family notified on   of transfer.  Name of family member notified:        PHYSICIAN Please sign FL2, Please sign DNR     Additional Comment:     _______________________________________________ Ladell Pier, LCSW 09/10/2015, 1:06 PM

## 2015-09-10 NOTE — Progress Notes (Signed)
Physical Therapy Treatment Patient Details Name: [name] MRN: [id number] DOB: 11-14-1927 Today's Date: 09/10/2015    History of Present Illness 81 yo female admitted with acute respiratory failure. Hx of pulmonary fibrosis, CHF, HTN. Pt is from home alone.just DC from  hospital10/29/16    PT Comments    Pt is progressing toward her goals, she was able to ambulate today for 120 feet on 3L supplemental oxygen with one break, see below for details. Pt with plans to d/c to SNF for continuous rehab.     Follow Up Recommendations  SNF;Supervision/Assistance - 24 hour     Equipment Recommendations  None recommended by PT    Recommendations for Other Services       Precautions / Restrictions Precautions Precaution Comments: O2 dep, desats Restrictions Weight Bearing Restrictions: No    Mobility  Bed Mobility Overal bed mobility: Modified Independent Bed Mobility: Supine to Sit;Sit to Supine     Supine to sit: Modified independent (Device/Increase time) Sit to supine: Modified independent (Device/Increase time)   General bed mobility comments: self-assist by using rails and UE for trunk elevation  Transfers Overall transfer level: Needs assistance Equipment used: Rolling walker (2 wheeled) Transfers: Sit to/from Stand Sit to Stand: Min guard         General transfer comment: steady assist for ascend, min guard for descent for safety  Ambulation/Gait Ambulation/Gait assistance: Min guard Ambulation Distance (Feet): 120 Feet Assistive device: Rolling walker (2 wheeled) Gait Pattern/deviations: Step-through pattern;Decreased stride length     General Gait Details: standing break at 60 feet with O2 92% on 3L, SOB observed 3/4 DOE . O2 93% on 3 L after amb   Stairs            Wheelchair Mobility    Modified Rankin (Stroke Patients Only)       Balance                                    Cognition Arousal/Alertness:  Awake/alert Behavior During Therapy: WFL for tasks assessed/performed Overall Cognitive Status: Within Functional Limits for tasks assessed                      Exercises      General Comments        Pertinent Vitals/Pain Pain Assessment: No/denies pain    Home Living                      Prior Function            PT Goals (current goals can now be found in the care plan section) Acute Rehab PT Goals PT Goal Formulation: With patient Time For Goal Achievement: 09/22/15 Potential to Achieve Goals: Good Progress towards PT goals: Progressing toward goals    Frequency  Min 3X/week    PT Plan Current plan remains appropriate    Co-evaluation             End of Session Equipment Utilized During Treatment: Oxygen;Gait belt Activity Tolerance: Treatment limited secondary to medical complications (Comment) (desat) Patient left: in bed;with call bell/phone within reach     Time: 3474-2595 PT Time Calculation (min) (ACUTE ONLY): 27 min  Charges:  $Gait Training: 23-37 mins                    G Codes:      [facility]  Delon Revelo, SPT 09/10/2015, 2:56 PM

## 2015-09-10 NOTE — Discharge Summary (Addendum)
Physician Discharge Summary  [name] XJD:[id number] DOB: October 30, 1928 DOA: 09/05/2015  PCP: Binnie Rail, MD  Admit date: 09/05/2015 Discharge date: 09/11/2015  Time spent: 45 minutes  Recommendations for Outpatient Follow-up:  1. SLow prednisone taper 30mg  daily for 1 week and then 20mg  daily till Fu with Dr.Wert, please FU Hypersensitivity pneumonitis panel, Appt on 11/18, Now she wishes to see Dr.McQuaid instead, she is advised to call office on Monday and request this 2. FU with GI Dr.Nandigam for esophageal dysmotility  Discharge Diagnoses:    Acute hypoxic resp failure   Interstitial lung disease   Health care associated pneumonia    Essential hypertension   Chronic hyponatremia   Acute respiratory failure with hypoxia (HCC)   Acute bronchitis   Sepsis ([place])   Pyrexia   Arterial hypotension   Dysphagia   Discharge Condition: stable  Diet recommendation: heart healthy  Filed Weights   09/05/15 1455 09/05/15 1620  Weight: 69.4 kg (153 lb) 69.4 kg (153 lb)    History of present illness:  Chief complaint: dyspnea and weakness [name] is an 81 y.o. female past medical history of pulmonary fibrosis oxygen dependent, chronic resp failure, chronic diastolic heart failure recently discharged from the hospital twice for possible pneumonia treated empirically with IV antibiotics her last admission she was discharged on 09/04/2015  Came back  with hypoxia productive cough and generalized weakness  Hospital Course:  Acute respiratory failure with hypoxia/  -multifactorial,possible ILD flare, Chronic hypoxic resp failure, Superimposed infection/aspiration -treated with empiric IV Vancomycin/ Zosyn and steroids -seen by Pulmonary in consultation recommended slow Prednsione taper, SLP eval completed felt to have functional oropharyngeal swallow ability based on clinical swallow evaluation. NO s/s of aspiration with po observed nor indications of residuals. Had  esophogram which revealed Poor esophageal motility consistent with presbyesophagus. -Pulm recommended Prednisone 30mg  daily for 1 week and then 20mg  daily till FU with Dr.Wert -all cultures negative, changed to Levaquin, FU hypersensitivity panel -Weaned O2 down to 3-4L at the time of discharge  Essential hypertension:  -diovan on hold, resumed lasix  Chronic hyponatremia:  Multifactorial due to heart failure and pulmonary disease.  -improved  Acute on Chronic diastolic heart failure:  -given lasix 20mg  Iv x1 on 11/2 -change to PO lasix then, stable  Leukocytosis:  Multifactorial due to steroids and possible active infection -stable, afebrile now.   Consultants:  Pulmonary    Consultations:  Pulm  Dr.McQuaid  Discharge Exam: Filed Vitals:   09/10/15 1030  BP: 137/49  Pulse: 83  Temp: 97.8 F (36.6 C)  Resp: 18    General: AAOx3 Cardiovascular: S1S2/RRR Respiratory: CTAB  Discharge Instructions   Discharge Instructions    Diet - low sodium heart healthy    Complete by:  As directed      Increase activity slowly    Complete by:  As directed           Current Discharge Medication List    START taking these medications   Details  levofloxacin (LEVAQUIN) 500 MG tablet Take 1 tablet (500 mg total) by mouth daily. For 2days    polyethylene glycol (MIRALAX / GLYCOLAX) packet Take 17 g by mouth daily as needed. Qty: 14 each, Refills: 0    senna-docusate (SENOKOT-S) 8.6-50 MG tablet Take 1 tablet by mouth at bedtime as needed for mild constipation.      CONTINUE these medications which have CHANGED   Details  furosemide (LASIX) 20 MG tablet Take 2 tablets (40  mg total) by mouth daily. Qty: 30 tablet    predniSONE (DELTASONE) 10 MG tablet 30mg  daily for 1 week, then 20mg  daily till FU with Dr.Wert Refills: 0      CONTINUE these medications which have NOT CHANGED   Details  acetaminophen (TYLENOL) 650 MG CR tablet Take 1,300 mg by mouth every 8  (eight) hours as needed for pain.    aspirin EC 325 MG EC tablet Take 1 tablet (325 mg total) by mouth daily. Qty: 30 tablet, Refills: 0    benzonatate (TESSALON) 200 MG capsule Take 1 capsule (200 mg total) by mouth 3 (three) times daily as needed for cough. Qty: 20 capsule, Refills: 0    Cholecalciferol (VITAMIN D) 2000 UNITS tablet Take 2,000 Units by mouth at bedtime.    esomeprazole (NEXIUM) 20 MG capsule Take 20 mg by mouth 2 (two) times daily.     fluticasone (FLONASE) 50 MCG/ACT nasal spray Place 2 sprays into both nostrils once as needed for allergies or rhinitis.    ipratropium-albuterol (DUONEB) 0.5-2.5 (3) MG/3ML SOLN Take 3 mLs by nebulization every 6 (six) hours as needed. Qty: 360 mL, Refills: 0    Magnesium 250 MG TABS Take 250 mg by mouth at bedtime.     MELATONIN PO Take 1 tablet by mouth at bedtime.    sertraline (ZOLOFT) 100 MG tablet Take 100 mg by mouth daily with breakfast.     sodium chloride (OCEAN) 0.65 % SOLN nasal spray Place 1 spray into both nostrils 2 (two) times daily. Refills: 0    traMADol (ULTRAM) 50 MG tablet Take 50 mg by mouth every 6 (six) hours as needed for moderate pain.       STOP taking these medications     doxycycline (VIBRA-TABS) 100 MG tablet      valsartan (DIOVAN) 320 MG tablet        Allergies  Allergen Reactions  . Anesthetics, Amide Nausea And Vomiting  . Codeine Nausea And Vomiting  . Darvocet [Propoxyphene N-Acetaminophen] Nausea And Vomiting  . Antihistamines, Loratadine-Type Other (See Comments)    Weird dreams  . Moxifloxacin Anxiety and Other (See Comments)    Nightmares   Follow-up Information    Follow up with Christinia Gully, MD On 09/24/2015.   Specialty:  Pulmonary Disease   Why:  Appt at 2 PM   Contact information:   [address] [place] [address] [phone]        The results of significant diagnostics from this hospitalization (including imaging, microbiology, ancillary and  laboratory) are listed below for reference.    Significant Diagnostic Studies: Dg Chest 2 View  08/31/2015  CLINICAL DATA:  Shortness of breath. History of pulmonary fibrosis, chronic diastolic heart failure, hypertension, on 2 L home oxygen. On steroids for 1 year. EXAM: CHEST  2 VIEW COMPARISON:  Chest x-rays dated 08/01/2015 and 11/09/2014. FINDINGS: Changes of chronic fibrosis again noted bilaterally, most severe within the right upper lobe and at each lung base. Slightly increased opacity is now seen throughout the left lung. Heart size is upper normal, unchanged. Overall cardiomediastinal silhouette is stable. No pleural effusion identified. No pneumothorax. No acute osseous abnormality. IMPRESSION: Chronic fibrosis bilaterally, most prominent within the right upper lobe and at each lung base. Increased opacity throughout the left lung. This could represent superimposed edema or pneumonia. Electronically Signed   By: Franki Cabot M.D.   On: 08/31/2015 12:26   Ct Angio Chest Pe W/cm &/or Wo Cm  08/31/2015  CLINICAL DATA:  Short breath, history of pulmonary fibrosis. Chronic systolic heart failure. EXAM: CT ANGIOGRAPHY CHEST WITH CONTRAST TECHNIQUE: Multidetector CT imaging of the chest was performed using the standard protocol during bolus administration of intravenous contrast. Multiplanar CT image reconstructions and MIPs were obtained to evaluate the vascular anatomy. CONTRAST:  174mL OMNIPAQUE IOHEXOL 350 MG/ML SOLN COMPARISON:  CT thorax 08/15/2014 FINDINGS: Mediastinum/Nodes: There are no filling defects within the pulmonary arteries to suggest acute pulmonary embolism. No acute findings aorta great vessels. No pericardial fluid. Coronary calcifications are present. No axillary supraclavicular adenopathy. No mediastinal adenopathy. Esophagus. Lungs/Pleura: There is architectural distortion in the upper lobes with bronchiectasis. There scattered ground-glass opacities in the upper lobes. There is  peripheral reticulation and architectural distortion in the lower lobes with ground-glass opacities. No focal consolidation. Central airways are normal. The findings of architectural distortion and ground-glass opacities are similar to comparison CT of 08/15/2014. Upper abdomen: Large benign hepatic cysts.  Adrenal glands normal. Musculoskeletal: No aggressive osseous lesion. Review of the MIP images confirms the above findings. IMPRESSION: 1. No evidence acute pulmonary embolism. 2. Chronic interstitial lung disease, bronchiectasis, and severe ground-glass opacities not changed from CT of 08/15/2014. Electronically Signed   By: Suzy Bouchard M.D.   On: 08/31/2015 16:58   Dg Esophagus  09/07/2015  CLINICAL DATA:  81 year old female with dysphagia. Initial encounter. EXAM: ESOPHOGRAM/BARIUM SWALLOW TECHNIQUE: Single contrast examination was performed using  thin barium. FLUOROSCOPY TIME:  Radiation Exposure Index (as provided by the fluoroscopic device): 2 64.21 micro Gy COMPARISON:  08/31/2015 chest CT. FINDINGS: The examination was tailored to patient's requirement for high flow oxygen. Poor esophageal motility consistent with presbyesophagus. No laryngeal penetration or aspiration. Small sliding-type hiatal hernia. Mild smooth narrowing just above the hiatal hernia. A 13 mm barium tablet becomes temporarily lodged at this level and only clears with subsequent swallowing. IMPRESSION: Poor esophageal motility consistent with presbyesophagus. Small sliding-type hiatal hernia. Mild smooth narrowing just above the hiatal hernia. 13 mm barium tablet becomes temporarily lodged at this level and only clears with subsequent swallowing. Electronically Signed   By: Genia Del M.D.   On: 09/07/2015 16:05   Dg Chest Port 1 View  09/05/2015  CLINICAL DATA:  Shortness of breath.  History of pulmonary fibrosis EXAM: PORTABLE CHEST 1 VIEW COMPARISON:  08/30/2017 FINDINGS: Normal heart size. There is no pleural  effusion or edema identified. No airspace consolidation noted. Advanced interstitial changes of pulmonary fibrosis identified. No airspace consolidation noted. IMPRESSION: 1. Chronic lung disease of pulmonary fibrosis. 2. No superimposed pneumonia. Electronically Signed   By: Kerby Moors M.D.   On: 09/05/2015 12:57    Microbiology: Recent Results (from the past 240 hour(s))  Blood Culture (routine x 2)     Status: None (Preliminary result)   Collection Time: 09/05/15 12:10 PM  Result Value Ref Range Status   Specimen Description BLOOD LEFT ANTECUBITAL  Final   Special Requests BOTTLES DRAWN AEROBIC AND ANAEROBIC 5 CC EACH  Final   Culture   Final    NO GROWTH 4 DAYS Performed at [facility]    Report Status PENDING  Incomplete  Urine culture     Status: None   Collection Time: 09/05/15  1:00 PM  Result Value Ref Range Status   Specimen Description URINE, CATHETERIZED  Final   Special Requests NONE  Final   Culture   Final    NO GROWTH 1 DAY Performed at [facility]    Report Status  09/06/2015 FINAL  Final  Blood Culture (routine x 2)     Status: None (Preliminary result)   Collection Time: 09/05/15  5:16 PM  Result Value Ref Range Status   Specimen Description BLOOD LEFT HAND  Final   Special Requests BOTTLES DRAWN AEROBIC ONLY 5CC AEROBIC ONLY  Final   Culture   Final    NO GROWTH 4 DAYS Performed at [facility]    Report Status PENDING  Incomplete     Labs: Basic Metabolic Panel:  Recent Labs Lab 09/05/15 1212 09/05/15 1900 09/06/15 0539 09/09/15 0615 09/10/15 0600  NA 133*  --  137 135 135  K 4.1  --  4.2 4.3 4.2  CL 99*  --  104 98* 99*  CO2 28  --  27 32 29  GLUCOSE 111*  --  86 90 107*  BUN 16  --  14 13 15   CREATININE 0.68 0.67 0.57 0.69 0.68  CALCIUM 9.3  --  8.9 9.2 9.3   Liver Function Tests:  Recent Labs Lab 09/05/15 1212 09/06/15 0539  AST 29 23  ALT 32 29  ALKPHOS 73 64  BILITOT 0.8 1.0  PROT 6.3* 5.5*  ALBUMIN  2.9* 2.5*   No results for input(s): LIPASE, AMYLASE in the last 168 hours. No results for input(s): AMMONIA in the last 168 hours. CBC:  Recent Labs Lab 09/05/15 1212 09/05/15 1900 09/06/15 0539 09/09/15 0615 09/10/15 0600  WBC 13.2* 12.1* 12.3* 15.5* 15.0*  NEUTROABS 11.0*  --   --   --   --   HGB 12.1 11.1* 11.0* 11.0* 11.1*  HCT 38.0 34.3* 34.3* 34.5* 34.5*  MCV 90.9 89.6 91.5 91.0 90.8  PLT 304 290 298 346 331   Cardiac Enzymes: No results for input(s): CKTOTAL, CKMB, CKMBINDEX, TROPONINI in the last 168 hours. BNP: BNP (last 3 results)  Recent Labs  08/01/15 0745 08/03/15 0600 08/31/15 1232  BNP 250.1* 278.6* 358.8*    ProBNP (last 3 results) No results for input(s): PROBNP in the last 8760 hours.  CBG: No results for input(s): GLUCAP in the last 168 hours.     SignedDomenic Polite  [facility] 09/10/2015, 12:22 PM

## 2015-09-11 DIAGNOSIS — E871 Hypo-osmolality and hyponatremia: Secondary | ICD-10-CM | POA: Diagnosis not present

## 2015-09-11 DIAGNOSIS — I2510 Atherosclerotic heart disease of native coronary artery without angina pectoris: Secondary | ICD-10-CM | POA: Diagnosis not present

## 2015-09-11 DIAGNOSIS — J849 Interstitial pulmonary disease, unspecified: Secondary | ICD-10-CM | POA: Diagnosis not present

## 2015-09-11 DIAGNOSIS — M6281 Muscle weakness (generalized): Secondary | ICD-10-CM | POA: Diagnosis not present

## 2015-09-11 DIAGNOSIS — I509 Heart failure, unspecified: Secondary | ICD-10-CM | POA: Diagnosis not present

## 2015-09-11 DIAGNOSIS — R262 Difficulty in walking, not elsewhere classified: Secondary | ICD-10-CM | POA: Diagnosis not present

## 2015-09-11 DIAGNOSIS — I9589 Other hypotension: Secondary | ICD-10-CM | POA: Diagnosis not present

## 2015-09-11 DIAGNOSIS — J189 Pneumonia, unspecified organism: Secondary | ICD-10-CM | POA: Diagnosis not present

## 2015-09-11 DIAGNOSIS — Z9981 Dependence on supplemental oxygen: Secondary | ICD-10-CM | POA: Diagnosis not present

## 2015-09-11 DIAGNOSIS — R509 Fever, unspecified: Secondary | ICD-10-CM | POA: Diagnosis not present

## 2015-09-11 DIAGNOSIS — J9600 Acute respiratory failure, unspecified whether with hypoxia or hypercapnia: Secondary | ICD-10-CM | POA: Diagnosis not present

## 2015-09-11 DIAGNOSIS — J9610 Chronic respiratory failure, unspecified whether with hypoxia or hypercapnia: Secondary | ICD-10-CM | POA: Diagnosis not present

## 2015-09-11 DIAGNOSIS — R1310 Dysphagia, unspecified: Secondary | ICD-10-CM | POA: Diagnosis not present

## 2015-09-11 DIAGNOSIS — I10 Essential (primary) hypertension: Secondary | ICD-10-CM | POA: Diagnosis not present

## 2015-09-11 DIAGNOSIS — A419 Sepsis, unspecified organism: Secondary | ICD-10-CM | POA: Diagnosis not present

## 2015-09-11 DIAGNOSIS — J9601 Acute respiratory failure with hypoxia: Secondary | ICD-10-CM | POA: Diagnosis not present

## 2015-09-11 DIAGNOSIS — J208 Acute bronchitis due to other specified organisms: Secondary | ICD-10-CM | POA: Diagnosis not present

## 2015-09-11 MED ORDER — CALCIUM CARBONATE ANTACID 500 MG PO CHEW
1.0000 | CHEWABLE_TABLET | Freq: Four times a day (QID) | ORAL | Status: DC | PRN
  Filled 2015-09-11: qty 1

## 2015-09-11 NOTE — Clinical Social Work Placement (Signed)
   CLINICAL SOCIAL WORK PLACEMENT  NOTE  Date:  09/11/2015  Patient Details  Name: BEE MARCHIANO MRN: [id number] Date of Birth: 08-Nov-1927  Clinical Social Work is seeking post-discharge placement for this patient at the [place] level of care (*CSW will initial, date and re-position this form in  chart as items are completed):  Yes   Patient/family provided with [place] Work Department's list of facilities offering this level of care within the geographic area requested by the patient (or if unable, by the patient's family).  Yes   Patient/family informed of their freedom to choose among providers that offer the needed level of care, that participate in Medicare, Medicaid or managed care program needed by the patient, have an available bed and are willing to accept the patient.  Yes   Patient/family informed of [place]'s ownership interest in [facility] and [facility], as well as of the fact that they are under no obligation to receive care at these facilities.  PASRR submitted to EDS on 09/09/15     PASRR number received on       Existing PASRR number confirmed on       FL2 transmitted to all facilities in geographic area requested by pt/family on 09/09/15     FL2 transmitted to all facilities within larger geographic area on       Patient informed that his/her managed care company has contracts with or will negotiate with certain facilities, including the following:        Yes   Patient/family informed of bed offers received.  Patient chooses bed at [facility]     Physician recommends and patient chooses bed at      Patient to be transferred to [facility] on 09/11/15.  Patient to be transferred to facility by  ambulance     Patient family notified on  September 11, 2015 of transfer.  Name of family member notified:    Jim/brother    PHYSICIAN Please sign FL2, Please sign DNR     Additional Comment:     _______________________________________________ Carlean Jews, LCSW 09/11/2015, 11:05 AM

## 2015-09-11 NOTE — Progress Notes (Signed)
Patient discharged to SNF, copies of discharge medications and instruction sent to facility.  Patient to be transported via [place].

## 2015-09-11 NOTE — Care Management Note (Signed)
Case Management Note  Patient Details  Name: [name] MRN: [id number] Date of Birth: 1928-07-17  Subjective/Objective:     Acute hypoxic resp failure, bronchitis, HCAP          Action/Plan: Scheduled dc to [facility], Notified [facility] of dc to SNF.   Expected Discharge Date:  09/11/2015               Expected Discharge Plan:  [place]  In-House Referral:  Clinical Social Work  Discharge planning Services  CM Consult   Status of Service:  Completed, signed off  Medicare Important Message Given:  Yes-second notification given Date Medicare IM Given:    Medicare IM give by:    Date Additional Medicare IM Given:    Additional Medicare Important Message give by:     If discussed at [place] of Stay Meetings, dates discussed:    Additional Comments:  Erenest Rasher, RN 09/11/2015, 11:13 AM

## 2015-09-13 DIAGNOSIS — M6281 Muscle weakness (generalized): Secondary | ICD-10-CM | POA: Diagnosis not present

## 2015-09-13 DIAGNOSIS — R262 Difficulty in walking, not elsewhere classified: Secondary | ICD-10-CM | POA: Diagnosis not present

## 2015-09-14 DIAGNOSIS — M6281 Muscle weakness (generalized): Secondary | ICD-10-CM | POA: Diagnosis not present

## 2015-09-14 DIAGNOSIS — I509 Heart failure, unspecified: Secondary | ICD-10-CM | POA: Diagnosis not present

## 2015-09-14 DIAGNOSIS — J9610 Chronic respiratory failure, unspecified whether with hypoxia or hypercapnia: Secondary | ICD-10-CM | POA: Diagnosis not present

## 2015-09-14 DIAGNOSIS — I2510 Atherosclerotic heart disease of native coronary artery without angina pectoris: Secondary | ICD-10-CM | POA: Diagnosis not present

## 2015-09-24 ENCOUNTER — Ambulatory Visit: Payer: Medicare Other | Admitting: Internal Medicine

## 2015-10-02 ENCOUNTER — Encounter (HOSPITAL_COMMUNITY): Payer: Self-pay

## 2015-10-02 ENCOUNTER — Emergency Department (HOSPITAL_COMMUNITY): Payer: Medicare Other

## 2015-10-02 ENCOUNTER — Inpatient Hospital Stay (HOSPITAL_COMMUNITY)
Admission: EM | Admit: 2015-10-02 | Discharge: 2015-10-05 | DRG: 291 | Disposition: A | Discharge status: Skilled Nursing Facility | Payer: Medicare Other | Attending: Internal Medicine | Admitting: Internal Medicine

## 2015-10-02 DIAGNOSIS — J9621 Acute and chronic respiratory failure with hypoxia: Secondary | ICD-10-CM | POA: Diagnosis not present

## 2015-10-02 DIAGNOSIS — Z9981 Dependence on supplemental oxygen: Secondary | ICD-10-CM

## 2015-10-02 DIAGNOSIS — Z881 Allergy status to other antibiotic agents status: Secondary | ICD-10-CM | POA: Diagnosis not present

## 2015-10-02 DIAGNOSIS — I10 Essential (primary) hypertension: Secondary | ICD-10-CM | POA: Diagnosis present

## 2015-10-02 DIAGNOSIS — I5033 Acute on chronic diastolic (congestive) heart failure: Secondary | ICD-10-CM | POA: Diagnosis present

## 2015-10-02 DIAGNOSIS — Z7952 Long term (current) use of systemic steroids: Secondary | ICD-10-CM

## 2015-10-02 DIAGNOSIS — Z800 Family history of malignant neoplasm of digestive organs: Secondary | ICD-10-CM | POA: Diagnosis not present

## 2015-10-02 DIAGNOSIS — G4733 Obstructive sleep apnea (adult) (pediatric): Secondary | ICD-10-CM | POA: Diagnosis not present

## 2015-10-02 DIAGNOSIS — Z66 Do not resuscitate: Secondary | ICD-10-CM | POA: Diagnosis present

## 2015-10-02 DIAGNOSIS — I214 Non-ST elevation (NSTEMI) myocardial infarction: Secondary | ICD-10-CM

## 2015-10-02 DIAGNOSIS — F329 Major depressive disorder, single episode, unspecified: Secondary | ICD-10-CM | POA: Diagnosis present

## 2015-10-02 DIAGNOSIS — Z885 Allergy status to narcotic agent status: Secondary | ICD-10-CM

## 2015-10-02 DIAGNOSIS — I959 Hypotension, unspecified: Secondary | ICD-10-CM | POA: Diagnosis not present

## 2015-10-02 DIAGNOSIS — J309 Allergic rhinitis, unspecified: Secondary | ICD-10-CM | POA: Diagnosis not present

## 2015-10-02 DIAGNOSIS — Z7982 Long term (current) use of aspirin: Secondary | ICD-10-CM

## 2015-10-02 DIAGNOSIS — J9620 Acute and chronic respiratory failure, unspecified whether with hypoxia or hypercapnia: Secondary | ICD-10-CM | POA: Diagnosis present

## 2015-10-02 DIAGNOSIS — R0902 Hypoxemia: Secondary | ICD-10-CM | POA: Diagnosis not present

## 2015-10-02 DIAGNOSIS — J8410 Pulmonary fibrosis, unspecified: Secondary | ICD-10-CM

## 2015-10-02 DIAGNOSIS — E785 Hyperlipidemia, unspecified: Secondary | ICD-10-CM | POA: Diagnosis present

## 2015-10-02 DIAGNOSIS — R531 Weakness: Secondary | ICD-10-CM | POA: Insufficient documentation

## 2015-10-02 DIAGNOSIS — I2510 Atherosclerotic heart disease of native coronary artery without angina pectoris: Secondary | ICD-10-CM | POA: Diagnosis present

## 2015-10-02 DIAGNOSIS — E46 Unspecified protein-calorie malnutrition: Secondary | ICD-10-CM | POA: Diagnosis not present

## 2015-10-02 DIAGNOSIS — R069 Unspecified abnormalities of breathing: Secondary | ICD-10-CM | POA: Diagnosis not present

## 2015-10-02 DIAGNOSIS — Z8249 Family history of ischemic heart disease and other diseases of the circulatory system: Secondary | ICD-10-CM | POA: Diagnosis not present

## 2015-10-02 DIAGNOSIS — M1990 Unspecified osteoarthritis, unspecified site: Secondary | ICD-10-CM | POA: Diagnosis present

## 2015-10-02 DIAGNOSIS — K219 Gastro-esophageal reflux disease without esophagitis: Secondary | ICD-10-CM | POA: Diagnosis present

## 2015-10-02 DIAGNOSIS — I050 Rheumatic mitral stenosis: Secondary | ICD-10-CM | POA: Diagnosis present

## 2015-10-02 DIAGNOSIS — Z888 Allergy status to other drugs, medicaments and biological substances status: Secondary | ICD-10-CM | POA: Diagnosis not present

## 2015-10-02 DIAGNOSIS — Z86010 Personal history of colonic polyps: Secondary | ICD-10-CM

## 2015-10-02 DIAGNOSIS — J9600 Acute respiratory failure, unspecified whether with hypoxia or hypercapnia: Secondary | ICD-10-CM | POA: Diagnosis not present

## 2015-10-02 DIAGNOSIS — R0602 Shortness of breath: Secondary | ICD-10-CM | POA: Diagnosis not present

## 2015-10-02 DIAGNOSIS — Z853 Personal history of malignant neoplasm of breast: Secondary | ICD-10-CM

## 2015-10-02 DIAGNOSIS — I5032 Chronic diastolic (congestive) heart failure: Secondary | ICD-10-CM | POA: Diagnosis not present

## 2015-10-02 DIAGNOSIS — Z515 Encounter for palliative care: Secondary | ICD-10-CM | POA: Diagnosis not present

## 2015-10-02 DIAGNOSIS — R1310 Dysphagia, unspecified: Secondary | ICD-10-CM | POA: Diagnosis not present

## 2015-10-02 DIAGNOSIS — J84112 Idiopathic pulmonary fibrosis: Secondary | ICD-10-CM | POA: Diagnosis present

## 2015-10-02 DIAGNOSIS — R509 Fever, unspecified: Secondary | ICD-10-CM | POA: Diagnosis not present

## 2015-10-02 DIAGNOSIS — Z79899 Other long term (current) drug therapy: Secondary | ICD-10-CM | POA: Diagnosis not present

## 2015-10-02 DIAGNOSIS — E669 Obesity, unspecified: Secondary | ICD-10-CM | POA: Diagnosis not present

## 2015-10-02 DIAGNOSIS — E871 Hypo-osmolality and hyponatremia: Secondary | ICD-10-CM | POA: Diagnosis not present

## 2015-10-02 LAB — CBC WITH DIFFERENTIAL/PLATELET
BASOS ABS: 0 10*3/uL (ref 0.0–0.1)
Basophils Relative: 0 %
Eosinophils Absolute: 0 10*3/uL (ref 0.0–0.7)
Eosinophils Relative: 0 %
HEMATOCRIT: 35.1 % — AB (ref 36.0–46.0)
Hemoglobin: 11.3 g/dL — ABNORMAL LOW (ref 12.0–15.0)
LYMPHS PCT: 9 %
Lymphs Abs: 1 10*3/uL (ref 0.7–4.0)
MCH: 29.2 pg (ref 26.0–34.0)
MCHC: 32.2 g/dL (ref 30.0–36.0)
MCV: 90.7 fL (ref 78.0–100.0)
MONO ABS: 1.3 10*3/uL — AB (ref 0.1–1.0)
Monocytes Relative: 12 %
NEUTROS ABS: 9 10*3/uL — AB (ref 1.7–7.7)
Neutrophils Relative %: 79 %
Platelets: 192 10*3/uL (ref 150–400)
RBC: 3.87 MIL/uL (ref 3.87–5.11)
RDW: 15.6 % — AB (ref 11.5–15.5)
WBC: 11.3 10*3/uL — ABNORMAL HIGH (ref 4.0–10.5)

## 2015-10-02 LAB — BLOOD GAS, ARTERIAL
Acid-Base Excess: 1.7 mmol/L (ref 0.0–2.0)
Bicarbonate: 24.5 mEq/L — ABNORMAL HIGH (ref 20.0–24.0)
DRAWN BY: 331471
O2 Content: 6 L/min
O2 SAT: 98.6 %
Patient temperature: 98.6
TCO2: 22.2 mmol/L (ref 0–100)
pCO2 arterial: 33.5 mmHg — ABNORMAL LOW (ref 35.0–45.0)
pH, Arterial: 7.478 — ABNORMAL HIGH (ref 7.350–7.450)
pO2, Arterial: 116 mmHg — ABNORMAL HIGH (ref 80.0–100.0)

## 2015-10-02 LAB — BASIC METABOLIC PANEL
ANION GAP: 6 (ref 5–15)
BUN: 13 mg/dL (ref 6–20)
CO2: 30 mmol/L (ref 22–32)
Calcium: 9.5 mg/dL (ref 8.9–10.3)
Chloride: 103 mmol/L (ref 101–111)
Creatinine, Ser: 0.64 mg/dL (ref 0.44–1.00)
GFR calc Af Amer: >60 mL/min (ref >60)
GLUCOSE: 121 mg/dL — AB (ref 65–99)
POTASSIUM: 4.1 mmol/L (ref 3.5–5.1)
Sodium: 139 mmol/L (ref 135–145)

## 2015-10-02 LAB — TROPONIN I
TROPONIN I: 0.59 ng/mL — AB (ref <0.031)
TROPONIN I: 0.57 ng/mL — AB (ref <0.031)

## 2015-10-02 LAB — I-STAT CG4 LACTIC ACID, ED: Lactic Acid, Venous: 1.35 mmol/L (ref 0.5–2.0)

## 2015-10-02 LAB — PROTIME-INR
INR: 1.11 (ref 0.00–1.49)
Prothrombin Time: 14.5 seconds (ref 11.6–15.2)

## 2015-10-02 LAB — BRAIN NATRIURETIC PEPTIDE: B NATRIURETIC PEPTIDE 5: 483.7 pg/mL — AB (ref 0.0–100.0)

## 2015-10-02 LAB — I-STAT TROPONIN, ED: TROPONIN I, POC: 0.19 ng/mL — AB (ref 0.00–0.08)

## 2015-10-02 LAB — TSH: TSH: 0.504 u[IU]/mL (ref 0.350–4.500)

## 2015-10-02 MED ORDER — MAGNESIUM GLUCONATE 500 MG PO TABS
250.0000 mg | ORAL_TABLET | Freq: Every day | ORAL | Status: DC
  Administered 2015-10-02 – 2015-10-04 (×3): 250 mg via ORAL
  Filled 2015-10-02 (×4): qty 1

## 2015-10-02 MED ORDER — FLUTICASONE PROPIONATE 50 MCG/ACT NA SUSP
2.0000 | Freq: Two times a day (BID) | NASAL | Status: DC
  Administered 2015-10-02 – 2015-10-05 (×7): 2 via NASAL
  Filled 2015-10-02: qty 16

## 2015-10-02 MED ORDER — IPRATROPIUM-ALBUTEROL 0.5-2.5 (3) MG/3ML IN SOLN
3.0000 mL | Freq: Once | RESPIRATORY_TRACT | Status: AC
  Administered 2015-10-02: 3 mL via RESPIRATORY_TRACT
  Filled 2015-10-02: qty 3

## 2015-10-02 MED ORDER — FUROSEMIDE 10 MG/ML IJ SOLN
40.0000 mg | Freq: Two times a day (BID) | INTRAMUSCULAR | Status: DC
  Administered 2015-10-02 – 2015-10-03 (×2): 40 mg via INTRAVENOUS
  Filled 2015-10-02 (×2): qty 4

## 2015-10-02 MED ORDER — SERTRALINE HCL 100 MG PO TABS
100.0000 mg | ORAL_TABLET | Freq: Every day | ORAL | Status: DC
  Administered 2015-10-03 – 2015-10-05 (×3): 100 mg via ORAL
  Filled 2015-10-02 (×3): qty 1

## 2015-10-02 MED ORDER — SALINE SPRAY 0.65 % NA SOLN
1.0000 | Freq: Every day | NASAL | Status: DC | PRN
  Administered 2015-10-02: 1 via NASAL
  Filled 2015-10-02: qty 44

## 2015-10-02 MED ORDER — DEXTROSE 5 % IV SOLN: 2.0000 g | Freq: Once | INTRAVENOUS | Status: DC

## 2015-10-02 MED ORDER — ACETAMINOPHEN 325 MG PO TABS: 650.0000 mg | ORAL_TABLET | Freq: Four times a day (QID) | ORAL | Status: DC | PRN

## 2015-10-02 MED ORDER — ENOXAPARIN SODIUM 40 MG/0.4ML ~~LOC~~ SOLN
40.0000 mg | SUBCUTANEOUS | Status: DC
  Administered 2015-10-02 – 2015-10-04 (×3): 40 mg via SUBCUTANEOUS
  Filled 2015-10-02 (×3): qty 0.4

## 2015-10-02 MED ORDER — ACETAMINOPHEN 650 MG RE SUPP: 650.0000 mg | Freq: Four times a day (QID) | RECTAL | Status: DC | PRN

## 2015-10-02 MED ORDER — METHYLPREDNISOLONE SODIUM SUCC 125 MG IJ SOLR
80.0000 mg | Freq: Three times a day (TID) | INTRAMUSCULAR | Status: DC
  Administered 2015-10-02 – 2015-10-05 (×10): 80 mg via INTRAVENOUS
  Filled 2015-10-02 (×10): qty 2

## 2015-10-02 MED ORDER — CETYLPYRIDINIUM CHLORIDE 0.05 % MT LIQD
7.0000 mL | Freq: Two times a day (BID) | OROMUCOSAL | Status: DC
  Administered 2015-10-02 – 2015-10-05 (×6): 7 mL via OROMUCOSAL

## 2015-10-02 MED ORDER — PANTOPRAZOLE SODIUM 40 MG PO TBEC
40.0000 mg | DELAYED_RELEASE_TABLET | Freq: Every day | ORAL | Status: DC
  Filled 2015-10-02 (×3): qty 1

## 2015-10-02 MED ORDER — HYDROCODONE-ACETAMINOPHEN 5-325 MG PO TABS: 1.0000 | ORAL_TABLET | ORAL | Status: DC | PRN

## 2015-10-02 MED ORDER — ONDANSETRON HCL 4 MG PO TABS: 4.0000 mg | ORAL_TABLET | Freq: Four times a day (QID) | ORAL | Status: DC | PRN

## 2015-10-02 MED ORDER — DEXTROSE 5 % IV SOLN
1.0000 g | Freq: Three times a day (TID) | INTRAVENOUS | Status: DC
  Filled 2015-10-02: qty 1

## 2015-10-02 MED ORDER — TRAMADOL HCL 50 MG PO TABS: 50.0000 mg | ORAL_TABLET | Freq: Four times a day (QID) | ORAL | Status: DC | PRN

## 2015-10-02 MED ORDER — METHYLPREDNISOLONE SODIUM SUCC 125 MG IJ SOLR
125.0000 mg | Freq: Once | INTRAMUSCULAR | Status: AC
  Administered 2015-10-02: 125 mg via INTRAVENOUS
  Filled 2015-10-02: qty 2

## 2015-10-02 MED ORDER — ASPIRIN 81 MG PO CHEW
324.0000 mg | CHEWABLE_TABLET | Freq: Once | ORAL | Status: AC
  Administered 2015-10-02: 324 mg via ORAL
  Filled 2015-10-02: qty 4

## 2015-10-02 MED ORDER — ASPIRIN EC 325 MG PO TBEC
325.0000 mg | DELAYED_RELEASE_TABLET | Freq: Every day | ORAL | Status: DC
  Administered 2015-10-03 – 2015-10-05 (×3): 325 mg via ORAL
  Filled 2015-10-02 (×4): qty 1

## 2015-10-02 MED ORDER — FUROSEMIDE 10 MG/ML IJ SOLN
40.0000 mg | INTRAMUSCULAR | Status: AC
  Administered 2015-10-02: 40 mg via INTRAVENOUS
  Filled 2015-10-02: qty 4

## 2015-10-02 MED ORDER — GUAIFENESIN ER 600 MG PO TB12
1200.0000 mg | ORAL_TABLET | Freq: Two times a day (BID) | ORAL | Status: DC
  Administered 2015-10-02 – 2015-10-03 (×3): 1200 mg via ORAL
  Administered 2015-10-03: 600 mg via ORAL
  Administered 2015-10-04 – 2015-10-05 (×3): 1200 mg via ORAL
  Filled 2015-10-02 (×7): qty 2

## 2015-10-02 MED ORDER — DEXTROSE 5 % IV SOLN: 2.0000 g | Freq: Three times a day (TID) | INTRAVENOUS | Status: DC

## 2015-10-02 MED ORDER — POLYETHYLENE GLYCOL 3350 17 G PO PACK
17.0000 g | PACK | Freq: Every day | ORAL | Status: DC
  Administered 2015-10-03 – 2015-10-05 (×3): 17 g via ORAL
  Filled 2015-10-02 (×3): qty 1

## 2015-10-02 MED ORDER — VANCOMYCIN HCL 500 MG IV SOLR
500.0000 mg | Freq: Two times a day (BID) | INTRAVENOUS | Status: DC
  Filled 2015-10-02: qty 500

## 2015-10-02 MED ORDER — VANCOMYCIN HCL IN DEXTROSE 1-5 GM/200ML-% IV SOLN
1000.0000 mg | Freq: Once | INTRAVENOUS | Status: DC
  Filled 2015-10-02: qty 200

## 2015-10-02 MED ORDER — BENZONATATE 100 MG PO CAPS
200.0000 mg | ORAL_CAPSULE | Freq: Three times a day (TID) | ORAL | Status: DC | PRN
  Administered 2015-10-03 – 2015-10-04 (×3): 200 mg via ORAL
  Filled 2015-10-02 (×3): qty 2

## 2015-10-02 MED ORDER — ONDANSETRON HCL 4 MG/2ML IJ SOLN: 4.0000 mg | Freq: Four times a day (QID) | INTRAMUSCULAR | Status: DC | PRN

## 2015-10-02 MED ORDER — SENNOSIDES-DOCUSATE SODIUM 8.6-50 MG PO TABS
1.0000 | ORAL_TABLET | Freq: Every evening | ORAL | Status: DC | PRN
  Administered 2015-10-04: 1 via ORAL
  Filled 2015-10-02: qty 1

## 2015-10-02 MED ORDER — SODIUM CHLORIDE 0.9 % IV SOLN
INTRAVENOUS | Status: DC
  Administered 2015-10-02: 18:00:00 via INTRAVENOUS

## 2015-10-02 MED ORDER — IPRATROPIUM-ALBUTEROL 0.5-2.5 (3) MG/3ML IN SOLN
3.0000 mL | Freq: Four times a day (QID) | RESPIRATORY_TRACT | Status: DC
  Administered 2015-10-02 – 2015-10-05 (×11): 3 mL via RESPIRATORY_TRACT
  Filled 2015-10-02 (×11): qty 3

## 2015-10-02 MED ORDER — MORPHINE SULFATE (PF) 2 MG/ML IV SOLN: 1.0000 mg | INTRAVENOUS | Status: DC | PRN

## 2015-10-02 NOTE — H&P (Signed)
[facility] History and Physical  [name] [id number] DOB: 05-06-1928 DOA: 10/02/2015  Referring physician: EDP PCP: Binnie Rail, MD   Chief Complaint: Shortness of breath  HPI: [name] is a 81 y.o. female with past medical history of pulmonary fibrosis, chronic respiratory failure home oxygen between 2 and 3 L. Patient admitted to the hospital 3 times in the past 6 month (this is the fourth time). Patient discharged from the hospital on 09/11/15 to a nursing home from which she was discharged 3 days ago, she was on 3 L of oxygen at the nursing home, after she got home patient felt short of breath, her oxygen concentrator is set at 2.5 L, she tries to increase it and she reported the concentrator was beeping and a yellow light went off last night. So she came into the hospital complaining about shortness of breath. In the ED patient was hypoxic at 84% on 3 L, placed on nonrebreather mask, was able to go to down to 45% on Ventimask. Patient admitted to the floor for further evaluation. She is DNR/DNI, apparently interested in exploring palliative route, palliative was called by the EDP.  Review of Systems:  Constitutional: negative for anorexia, fevers and sweats Eyes: negative for irritation, redness and visual disturbance Ears, nose, mouth, throat, and face: negative for earaches, epistaxis, nasal congestion and sore throat Respiratory: SOB and respiratory distress Cardiovascular: negative for chest pain, dyspnea, lower extremity edema, orthopnea, palpitations and syncope Gastrointestinal: negative for abdominal pain, constipation, diarrhea, melena, nausea and vomiting Genitourinary:negative for dysuria, frequency and hematuria Hematologic/lymphatic: negative for bleeding, easy bruising and lymphadenopathy Musculoskeletal:negative for arthralgias, muscle weakness and stiff joints Neurological: negative for coordination problems, gait problems, headaches and  weakness Endocrine: negative for diabetic symptoms including polydipsia, polyuria and weight loss Allergic/Immunologic: negative for anaphylaxis, hay fever and urticaria  Past Medical History  Diagnosis Date  . Dyspnea   . Diverticulosis 2008  . Internal hemorrhoids   . Esophageal stricture   . Arthritis   . Breast cancer ([place]) 1969  . GERD (gastroesophageal reflux disease)   . Gallstones   . Depression   . HLD (hyperlipidemia)   . HTN (hypertension)   . Obesity   . Colon polyp 2013    TUBULAR ADENOMA (X1  . Pulmonary fibrosis ([place])   . CAD (coronary artery disease)   . Chronic diastolic CHF (congestive heart failure) ([place]) 08/01/2015   Past Surgical History  Procedure Laterality Date  . Abdominal hysterectomy    . Cholecystectomy    . Mastectomy      right  . Appendectomy    . Foot surgery    . Breast surgery     Social History:   reports that she has never smoked. She has never used smokeless tobacco. She reports that she drinks about 4.2 oz of alcohol per week. She reports that she does not use illicit drugs.  Allergies  Allergen Reactions  . Anesthetics, Amide Nausea And Vomiting  . Atorvastatin Other (See Comments)    Myalgia   . Codeine Nausea And Vomiting  . Darvocet [Propoxyphene N-Acetaminophen] Nausea And Vomiting  . Valsartan Other (See Comments)    Per patient " did not feel well, cannot describe it"  . Antihistamines, Loratadine-Type Other (See Comments)    Weird dreams  . Moxifloxacin Anxiety and Other (See Comments)    Nightmares    Family History  Problem Relation Age of Onset  . Heart disease Mother   . Cancer Mother  bladder  . Esophageal cancer Brother   . Prostate cancer Brother   . Heart disease Brother   . Cancer Brother     sarcoma  . Stomach cancer Neg Hx    Prior to Admission medications   Medication Sig Start Date End Date Taking? Authorizing Provider  acetaminophen (TYLENOL) 650 MG CR tablet Take 1,300 mg by mouth every 8  (eight) hours as needed for pain.   Yes Historical Provider, MD  albuterol (PROVENTIL HFA;VENTOLIN HFA) 108 (90 BASE) MCG/ACT inhaler Inhale 2 puffs into the lungs every 4 (four) hours as needed for wheezing or shortness of breath.   Yes Historical Provider, MD  aspirin EC 325 MG EC tablet Take 1 tablet (325 mg total) by mouth daily. 08/20/14  Yes Marton Redwood, MD  benzonatate (TESSALON) 200 MG capsule Take 1 capsule (200 mg total) by mouth 3 (three) times daily as needed for cough. 09/04/15  Yes Charlynne Cousins, MD  Cholecalciferol (VITAMIN D) 2000 UNITS tablet Take 2,000 Units by mouth at bedtime.   Yes Historical Provider, MD  esomeprazole (NEXIUM) 20 MG capsule Take 40 mg by mouth every morning.    Yes Historical Provider, MD  fluticasone (FLONASE) 50 MCG/ACT nasal spray Place 2 sprays into both nostrils 2 (two) times daily.    Yes Historical Provider, MD  furosemide (LASIX) 20 MG tablet Take 2 tablets (40 mg total) by mouth daily. 09/10/15  Yes Domenic Polite, MD  Magnesium 250 MG TABS Take 250 mg by mouth at bedtime.    Yes Historical Provider, MD  MELATONIN PO Take 1 tablet by mouth at bedtime.   Yes Historical Provider, MD  polyethylene glycol (MIRALAX / GLYCOLAX) packet Take 17 g by mouth daily as needed. 09/10/15  Yes Domenic Polite, MD  predniSONE (DELTASONE) 10 MG tablet 30mg  daily for 1 week, then 20mg  daily till FU with Dr.Wert 09/10/15  Yes Domenic Polite, MD  senna-docusate (SENOKOT-S) 8.6-50 MG tablet Take 1 tablet by mouth at bedtime as needed for mild constipation. 09/10/15  Yes Domenic Polite, MD  sertraline (ZOLOFT) 100 MG tablet Take 100 mg by mouth daily with breakfast.    Yes Historical Provider, MD  sodium chloride (OCEAN) 0.65 % SOLN nasal spray Place 1 spray into both nostrils 2 (two) times daily. Patient taking differently: Place 1 spray into both nostrils daily as needed for congestion.  08/20/14  Yes Marton Redwood, MD  traMADol (ULTRAM) 50 MG tablet Take 50 mg by mouth  every 6 (six) hours as needed for moderate pain.    Yes Historical Provider, MD  ipratropium-albuterol (DUONEB) 0.5-2.5 (3) MG/3ML SOLN Take 3 mLs by nebulization every 6 (six) hours as needed. Patient not taking: Reported on 10/02/2015 08/05/15   Robbie Lis, MD  levofloxacin (LEVAQUIN) 500 MG tablet Take 1 tablet (500 mg total) by mouth daily. For 2days Patient not taking: Reported on 10/02/2015 09/10/15   Domenic Polite, MD   Physical Exam: Filed Vitals:   10/02/15 1030 10/02/15 1046  BP:  151/66  Pulse: 103 106  Temp:    Resp: 26 26   Constitutional: Oriented to person, place, and time. Well-developed and well-nourished. Cooperative.  Head: Normocephalic and atraumatic.  Nose: Nose normal.  Mouth/Throat: Uvula is midline, oropharynx is clear and moist and mucous membranes are normal.  Eyes: Conjunctivae and EOM are normal. Pupils are equal, round, and reactive to light.  Neck: Trachea normal and normal range of motion. Neck supple.  Cardiovascular: Normal rate, regular rhythm, S1  normal, S2 normal, normal heart sounds and intact distal pulses.   Pulmonary/Chest: Effort normal and breath sounds normal.  Abdominal: Soft. Bowel sounds are normal. There is no hepatosplenomegaly. There is no tenderness.  Musculoskeletal: Normal range of motion.  Neurological: Alert and oriented to person, place, and time. Has normal strength. No cranial nerve deficit or sensory deficit.  Skin: Skin is warm, dry and intact.  Psychiatric: Has a normal mood and affect. Speech is normal and behavior is normal.   Labs on Admission:  Basic Metabolic Panel:  Recent Labs Lab 10/02/15 0755  NA 139  K 4.1  CL 103  CO2 30  GLUCOSE 121*  BUN 13  CREATININE 0.64  CALCIUM 9.5   Liver Function Tests: No results for input(s): AST, ALT, ALKPHOS, BILITOT, PROT, ALBUMIN in the last 168 hours. No results for input(s): LIPASE, AMYLASE in the last 168 hours. No results for input(s): AMMONIA in the last 168  hours. CBC:  Recent Labs Lab 10/02/15 0755  WBC 11.3*  NEUTROABS 9.0*  HGB 11.3*  HCT 35.1*  MCV 90.7  PLT 192   Cardiac Enzymes: No results for input(s): CKTOTAL, CKMB, CKMBINDEX, TROPONINI in the last 168 hours.  BNP (last 3 results)  Recent Labs  08/03/15 0600 08/31/15 1232 10/02/15 0755  BNP 278.6* 358.8* 483.7*    ProBNP (last 3 results) No results for input(s): PROBNP in the last 8760 hours.  CBG: No results for input(s): GLUCAP in the last 168 hours.  Radiological Exams on Admission: Dg Chest Port 1 View  10/02/2015  CLINICAL DATA:  Worsening shortness of breath since last night. History of pulmonary fibrosis. EXAM: PORTABLE CHEST 1 VIEW COMPARISON:  09/05/2015, 08/31/2015 and chest CT 08/31/2015 FINDINGS: Lungs are somewhat hypoinflated with persistent bilateral diffuse interstitial changes compatible with known pulmonary fibrosis. There is worsening patchy mixed interstitial airspace density bilaterally right worse than left suggesting superimposed pneumonia. No evidence of effusion. Mild cardiomegaly. Calcified plaque over the thoracic aorta. There are degenerative changes of the spine. IMPRESSION: Hypoinflation with evidence of known underlying chronic fibrosis. Worsening bilateral patchy mixed interstitial airspace density likely superimposed pneumonia. Mild cardiomegaly. Electronically Signed   By: Marin Olp M.D.   On: 10/02/2015 08:53    EKG: Independently reviewed.  Assessment/Plan Principal Problem:   Acute and chronic respiratory failure with hypoxia (HCC) Active Problems:   Postinflammatory pulmonary fibrosis (HCC)   Chronic diastolic CHF (congestive heart failure) ([place])   DNR (do not resuscitate)    Acute on chronic respiratory failure with hypoxia Patient used to be on 2.5 L of oxygen about a month ago, she was discharged on 3-4 L of oxygen. Reported that her oxygen concentrator at home might be malfunctioning, they were unable to reach the  home health agency. Placed on oxygen, keep saturation around 91%. Started on IV antibiotics in the ED, I will discontinue. No fever or chills, no impressive leukocytosis. Started on high dose of steroids and Lasix.  Acute on chronic diastolic CHF 2-D echo in September showed LVEF of 65-70% with grade 1 diastolic dysfunction. Has a slightly fluid overload and positive BNP of 483, I'll diurese with IV Lasix to keep negative fluid balance. Continue home medications. She also has moderate mitral valve stenosis  Positive troponin Slightly positive troponin of 0.19, this is likely secondary to the hypoxia and CHF. Patient denies any chest pain, I don't think this is ACS. Cycle 3 sets of cardiac enzymes.  Pulmonary fibrosis Idiopathic pulmonary fibrosis, appears to be worsening  overall. Oxygen requirements increased in the past 2 month all the way to 4 L. 4 admissions to the hospital in the past 6 months, EDP consulted palliative.   Code Status: DNR/DNI Family Communication: Plan discussed with the patient in the presence of her brother Education officer, community) at bedside Disposition Plan: Inpatient  Time spent: 44 minutes  [facility] A, MD [facility] Pager [phone]

## 2015-10-02 NOTE — ED Notes (Signed)
Waiting on fortaz medication from pharmacy

## 2015-10-02 NOTE — ED Notes (Signed)
I have just phoned report to [place], [place] on [address] and will transport shortly.  Pt. Remains in no distress.  O2 maintained at 55% F.I.O.2

## 2015-10-02 NOTE — Progress Notes (Signed)
ANTIBIOTIC CONSULT NOTE - INITIAL  Pharmacy Consult for Ceftazidime Indication: HCAP  Allergies  Allergen Reactions  . Anesthetics, Amide Nausea And Vomiting  . Atorvastatin Other (See Comments)    Myalgia   . Codeine Nausea And Vomiting  . Darvocet [Propoxyphene N-Acetaminophen] Nausea And Vomiting  . Valsartan Other (See Comments)    Per patient " did not feel well, cannot describe it"  . Antihistamines, Loratadine-Type Other (See Comments)    Weird dreams  . Moxifloxacin Anxiety and Other (See Comments)    Nightmares    Patient Measurements:   Adjusted Body Weight:   Vital Signs: Temp: 98.1 F (36.7 C) (11/26 0726) Temp Source: Oral (11/26 0726) BP: 129/51 mmHg (11/26 0726) Pulse Rate: 105 (11/26 0726) Intake/Output from previous day:   Intake/Output from this shift:    Labs:  Recent Labs  10/02/15 0755  WBC 11.3*  HGB 11.3*  PLT 192  CREATININE 0.64   CrCl cannot be calculated (Unknown ideal weight.). No results for input(s): VANCOTROUGH, VANCOPEAK, VANCORANDOM, GENTTROUGH, GENTPEAK, GENTRANDOM, TOBRATROUGH, TOBRAPEAK, TOBRARND, AMIKACINPEAK, AMIKACINTROU, AMIKACIN in the last 72 hours.   Microbiology: Recent Results (from the past 720 hour(s))  Blood Culture (routine x 2)     Status: None   Collection Time: 09/05/15 12:10 PM  Result Value Ref Range Status   Specimen Description BLOOD LEFT ANTECUBITAL  Final   Special Requests BOTTLES DRAWN AEROBIC AND ANAEROBIC 5 CC EACH  Final   Culture   Final    NO GROWTH 5 DAYS Performed at [facility]    Report Status 09/10/2015 FINAL  Final  Urine culture     Status: None   Collection Time: 09/05/15  1:00 PM  Result Value Ref Range Status   Specimen Description URINE, CATHETERIZED  Final   Special Requests NONE  Final   Culture   Final    NO GROWTH 1 DAY Performed at [facility]    Report Status 09/06/2015 FINAL  Final  Blood Culture (routine x 2)     Status: None   Collection Time:  09/05/15  5:16 PM  Result Value Ref Range Status   Specimen Description BLOOD LEFT HAND  Final   Special Requests BOTTLES DRAWN AEROBIC ONLY 5CC AEROBIC ONLY  Final   Culture   Final    NO GROWTH 5 DAYS Performed at [facility]    Report Status 09/10/2015 FINAL  Final    Medical History: Past Medical History  Diagnosis Date  . Dyspnea   . Diverticulosis 2008  . Internal hemorrhoids   . Esophageal stricture   . Arthritis   . Breast cancer ([place]) 1969  . GERD (gastroesophageal reflux disease)   . Gallstones   . Depression   . HLD (hyperlipidemia)   . HTN (hypertension)   . Obesity   . Colon polyp 2013    TUBULAR ADENOMA (X1  . Pulmonary fibrosis ([place])   . CAD (coronary artery disease)   . Chronic diastolic CHF (congestive heart failure) ([place]) 08/01/2015    Medications:   (Not in a hospital admission)  Assessment: 81yo F PMH including pulmonary fibrosis, CAD, CHF, HTN, HLD, GERD who p/w shortness of breath. Patient is normally on 3 L of home oxygen and was discharged to a rehabilitation facility 4 days ago after a hospitalization for shortness of breath. Pharmacy is asked to dose ceftazidime for suspected HCAP. SCr low, CrCl ~58, rounding SCr up to 0.8. Recent weight 69kg.  Goal of Therapy:  Appropriate  antibiotic dosing for renal function; eradication of infection  Plan:  Ceftazidime 2g x 1 as already ordered then 1g IV q8h. Follow up renal fxn, culture results, and clinical course.  Romeo Rabon, PharmD, pager [phone]. 10/02/2015,9:44 AM.

## 2015-10-02 NOTE — ED Notes (Signed)
She states she became short of breath last night/and her oxygen concentrator issued multiple alarms during the night but appeared to be working correctly.  She states he only symptom of current illness is some "post-nasal drip".  She denies fever nor any productive cough and her breathing "is normal for me".  She has hx of pulmonary fibrosis.  She is in no distress.

## 2015-10-02 NOTE — ED Notes (Signed)
Dr Rex Kras and RN aware of elevated istat troponin.

## 2015-10-02 NOTE — Progress Notes (Signed)
Pharmacy Consult Note - Vancomycin Initial  A/P: Note already been written for ceftazidime per pharmacy. See that note for labs and assessment. Vanc 1g ordered x 1 in ER. Per current renal function and weight, start vancomycin 500mg  IV q12. Will order trough at steady state as appropriate  Adrian Saran, PharmD, BCPS Pager [phone] 10/02/2015 10:01 AM

## 2015-10-02 NOTE — ED Provider Notes (Signed)
CSN: [id number]     Arrival date & time 10/02/15  [id number] History   First MD Initiated Contact with Patient 10/02/15 [phone]     Chief Complaint  Patient presents with  . Shortness of Breath     (Consider location/radiation/quality/duration/timing/severity/associated sxs/prior Treatment) HPI Comments: 81yo F PMH including pulmonary fibrosis, CAD, CHF, HTN, HLD, GERD who p/w shortness of breath. Patient is normally on 3 L of home oxygen and was discharged to a rehabilitation facility 4 days ago after a hospitalization for shortness of breath. She states that she was awakened from sleep this morning with her oxygen compressor alarming. She had bumped her oxygen up to 4 L but then it seemed that the compressor stopped working and she became short of breath. She denies any recent fevers, cough/cold symptoms, abdominal pain, or chest pain. She has chronic postnasal drip.  Patient is a 81 y.o. female presenting with shortness of breath. The history is provided by the patient.  Shortness of Breath   Past Medical History  Diagnosis Date  . Dyspnea   . Diverticulosis 2008  . Internal hemorrhoids   . Esophageal stricture   . Arthritis   . Breast cancer ([place]) 1969  . GERD (gastroesophageal reflux disease)   . Gallstones   . Depression   . HLD (hyperlipidemia)   . HTN (hypertension)   . Obesity   . Colon polyp 2013    TUBULAR ADENOMA (X1  . Pulmonary fibrosis ([place])   . CAD (coronary artery disease)   . Chronic diastolic CHF (congestive heart failure) ([place]) 08/01/2015   Past Surgical History  Procedure Laterality Date  . Abdominal hysterectomy    . Cholecystectomy    . Mastectomy      right  . Appendectomy    . Foot surgery    . Breast surgery     Family History  Problem Relation Age of Onset  . Heart disease Mother   . Cancer Mother     bladder  . Esophageal cancer Brother   . Prostate cancer Brother   . Heart disease Brother   . Cancer Brother     sarcoma  . Stomach cancer Neg  Hx    Social History  Substance Use Topics  . Smoking status: Never Smoker   . Smokeless tobacco: Never Used  . Alcohol Use: 4.2 oz/week    7 Glasses of wine per week     Comment: ocaasional   OB History    No data available     Review of Systems  Respiratory: Positive for shortness of breath.    10 Systems reviewed and are negative for acute change except as noted in the HPI.    Allergies  Anesthetics, amide; Atorvastatin; Codeine; Darvocet; Valsartan; Antihistamines, loratadine-type; and Moxifloxacin  Home Medications   Prior to Admission medications   Medication Sig Start Date End Date Taking? Authorizing Provider  acetaminophen (TYLENOL) 650 MG CR tablet Take 1,300 mg by mouth every 8 (eight) hours as needed for pain.   Yes Historical Provider, MD  albuterol (PROVENTIL HFA;VENTOLIN HFA) 108 (90 BASE) MCG/ACT inhaler Inhale 2 puffs into the lungs every 4 (four) hours as needed for wheezing or shortness of breath.   Yes Historical Provider, MD  aspirin EC 325 MG EC tablet Take 1 tablet (325 mg total) by mouth daily. 08/20/14  Yes Marton Redwood, MD  benzonatate (TESSALON) 200 MG capsule Take 1 capsule (200 mg total) by mouth 3 (three) times daily as needed for cough. 09/04/15  Yes Charlynne Cousins, MD  Cholecalciferol (VITAMIN D) 2000 UNITS tablet Take 2,000 Units by mouth at bedtime.   Yes Historical Provider, MD  esomeprazole (NEXIUM) 20 MG capsule Take 40 mg by mouth every morning.    Yes Historical Provider, MD  fluticasone (FLONASE) 50 MCG/ACT nasal spray Place 2 sprays into both nostrils 2 (two) times daily.    Yes Historical Provider, MD  furosemide (LASIX) 20 MG tablet Take 2 tablets (40 mg total) by mouth daily. 09/10/15  Yes Domenic Polite, MD  Magnesium 250 MG TABS Take 250 mg by mouth at bedtime.    Yes Historical Provider, MD  MELATONIN PO Take 1 tablet by mouth at bedtime.   Yes Historical Provider, MD  polyethylene glycol (MIRALAX / GLYCOLAX) packet Take 17 g by  mouth daily as needed. 09/10/15  Yes Domenic Polite, MD  predniSONE (DELTASONE) 10 MG tablet 30mg  daily for 1 week, then 20mg  daily till FU with Dr.Wert 09/10/15  Yes Domenic Polite, MD  senna-docusate (SENOKOT-S) 8.6-50 MG tablet Take 1 tablet by mouth at bedtime as needed for mild constipation. 09/10/15  Yes Domenic Polite, MD  sertraline (ZOLOFT) 100 MG tablet Take 100 mg by mouth daily with breakfast.    Yes Historical Provider, MD  sodium chloride (OCEAN) 0.65 % SOLN nasal spray Place 1 spray into both nostrils 2 (two) times daily. Patient taking differently: Place 1 spray into both nostrils daily as needed for congestion.  08/20/14  Yes Marton Redwood, MD  traMADol (ULTRAM) 50 MG tablet Take 50 mg by mouth every 6 (six) hours as needed for moderate pain.    Yes Historical Provider, MD  ipratropium-albuterol (DUONEB) 0.5-2.5 (3) MG/3ML SOLN Take 3 mLs by nebulization every 6 (six) hours as needed. Patient not taking: Reported on 10/02/2015 08/05/15   Robbie Lis, MD  levofloxacin (LEVAQUIN) 500 MG tablet Take 1 tablet (500 mg total) by mouth daily. For 2days Patient not taking: Reported on 10/02/2015 09/10/15   Domenic Polite, MD   BP 129/51 mmHg  Pulse 105  Temp(Src) 98.1 F (36.7 C) (Oral)  Resp 28  SpO2 91% Physical Exam  Constitutional: She is oriented to person, place, and time. She appears well-developed and well-nourished.  tachypneic but calm, NAD  HENT:  Head: Normocephalic and atraumatic.  Mouth/Throat: Oropharynx is clear and moist.  Moist mucous membranes  Eyes: Conjunctivae are normal. Pupils are equal, round, and reactive to light.  Neck: Neck supple.  Cardiovascular: Regular rhythm and normal heart sounds.   No murmur heard. tachycardic  Pulmonary/Chest:  Increased WOB w/ crackles b/l  Abdominal: Soft. Bowel sounds are normal. She exhibits no distension. There is no tenderness.  Musculoskeletal:  Trace b/l LE edema  Neurological: She is alert and oriented to person,  place, and time.  Fluent speech  Skin: Skin is warm and dry.  Psychiatric: She has a normal mood and affect. Judgment normal.  Nursing note and vitals reviewed.   ED Course  .Critical Care Performed by: Sharlett Iles Authorized by: Sharlett Iles Total critical care time: 40 minutes Critical care time was exclusive of separately billable procedures and treating other patients. Critical care was necessary to treat or prevent imminent or life-threatening deterioration of the following conditions: respiratory failure. Critical care was time spent personally by me on the following activities: development of treatment plan with patient or surrogate, discussions with consultants, evaluation of patient's response to treatment, examination of patient, obtaining history from patient or surrogate, ordering and performing  treatments and interventions, ordering and review of laboratory studies, ordering and review of radiographic studies, pulse oximetry, re-evaluation of patient's condition and review of old charts.   (including critical care time) Labs Review Labs Reviewed  BASIC METABOLIC PANEL - Abnormal; Notable for the following:    Glucose, Bld 121 (*)    All other components within normal limits  CBC WITH DIFFERENTIAL/PLATELET - Abnormal; Notable for the following:    WBC 11.3 (*)    Hemoglobin 11.3 (*)    HCT 35.1 (*)    RDW 15.6 (*)    Neutro Abs 9.0 (*)    Monocytes Absolute 1.3 (*)    All other components within normal limits  BLOOD GAS, ARTERIAL - Abnormal; Notable for the following:    pH, Arterial 7.478 (*)    pCO2 arterial 33.5 (*)    pO2, Arterial 116 (*)    Bicarbonate 24.5 (*)    All other components within normal limits  BRAIN NATRIURETIC PEPTIDE - Abnormal; Notable for the following:    B Natriuretic Peptide 483.7 (*)    All other components within normal limits  I-STAT TROPOININ, ED - Abnormal; Notable for the following:    Troponin i, poc 0.19 (*)     All other components within normal limits  CULTURE, BLOOD (ROUTINE X 2)  CULTURE, BLOOD (ROUTINE X 2)  I-STAT CG4 LACTIC ACID, ED    Imaging Review Dg Chest Port 1 View  10/02/2015  CLINICAL DATA:  Worsening shortness of breath since last night. History of pulmonary fibrosis. EXAM: PORTABLE CHEST 1 VIEW COMPARISON:  09/05/2015, 08/31/2015 and chest CT 08/31/2015 FINDINGS: Lungs are somewhat hypoinflated with persistent bilateral diffuse interstitial changes compatible with known pulmonary fibrosis. There is worsening patchy mixed interstitial airspace density bilaterally right worse than left suggesting superimposed pneumonia. No evidence of effusion. Mild cardiomegaly. Calcified plaque over the thoracic aorta. There are degenerative changes of the spine. IMPRESSION: Hypoinflation with evidence of known underlying chronic fibrosis. Worsening bilateral patchy mixed interstitial airspace density likely superimposed pneumonia. Mild cardiomegaly. Electronically Signed   By: Marin Olp M.D.   On: 10/02/2015 08:53   I have personally reviewed and evaluated these lab results as part of my medical decision-making.   EKG Interpretation   Date/Time:  Saturday October 02 2015 08:14:10 EST Ventricular Rate:  98 PR Interval:  199 QRS Duration: 103 QT Interval:  365 QTC Calculation: 466 R Axis:   103 Text Interpretation:  Sinus rhythm Left atrial enlargement Right axis  deviation No significant change since last tracing Confirmed by LITTLE MD,  RACHEL [phone]) on 10/02/2015 8:17:09 AM     Medications  cefTAZidime (FORTAZ) 1 g in dextrose 5 % 50 mL IVPB (not administered)  furosemide (LASIX) injection 40 mg (not administered)  vancomycin (VANCOCIN) IVPB 1000 mg/200 mL premix (not administered)  methylPREDNISolone sodium succinate (SOLU-MEDROL) 125 mg/2 mL injection 125 mg (125 mg Intravenous Given 10/02/15 0858)  ipratropium-albuterol (DUONEB) 0.5-2.5 (3) MG/3ML nebulizer solution 3 mL (3 mLs  Nebulization Given 10/02/15 0835)  aspirin chewable tablet 324 mg (324 mg Oral Given 10/02/15 0853)    MDM   Final diagnoses:  Hypoxia  Pulmonary fibrosis ([place])  NSTEMI (non-ST elevated myocardial infarction) ([place])    81yo F w/ extensive PMH including idiopathic pulmonary fibrosis on home oxygen who presents with shortness of breath that began this morning after her compressor started alarming and seemed to not be producing oxygen. On arrival, patient was awake, alert, dyspneic but in no acute distress.  Vital signs notable for initial O2 sat 93% on nonrebreather. During my examination, the patient's O2 sats improved to 96-98% and I was able to turn O2 down to 6L. Crackles heard b/l w/ tachypnea but pt mentating appropriately. Discussed goals of care and patient confirmed that she wanted comfort care only and was DNR/DNI. Obtained above labs, EKG and CXR.   EKG unchanged from previous. Labs notable for BNP 483, troponin 0.19, CBC 11.3, normal creatinine. Chest x-ray shows pulmonary fibrosis changes, possible superimposed pneumonia. Obtained blood cultures and gave the patient Vanc and ceftaz. Per recent pulm recs for steroids during last hospitalization, I also gave her a dose of Solu-Medrol and DuoNeb. Gave dose of aspirin but I suspect that troponin is due to demand ischemia from hypoxia and tachycardia as well as a possible component of heart failure. I have given a dose of IV Lasix but have held on heparin. I contacted palliative care and Alen Blew will evaluate patient; appreciate assistance w/ patient's care. Discussed with [facility] hospitalist and patient will be admitted to stepdown because of oxygen requirement. On reexamination, the patient remains tachycardic but stable on 10-15L non-rebreather w/ sats 98%. She is in agreement w/ plan.  Sharlett Iles, MD 10/02/15 1000

## 2015-10-03 LAB — CBC
HCT: 35.1 % — ABNORMAL LOW (ref 36.0–46.0)
HEMOGLOBIN: 11.2 g/dL — AB (ref 12.0–15.0)
MCH: 28.9 pg (ref 26.0–34.0)
MCHC: 31.9 g/dL (ref 30.0–36.0)
MCV: 90.5 fL (ref 78.0–100.0)
PLATELETS: 229 10*3/uL (ref 150–400)
RBC: 3.88 MIL/uL (ref 3.87–5.11)
RDW: 15.6 % — ABNORMAL HIGH (ref 11.5–15.5)
WBC: 10.1 10*3/uL (ref 4.0–10.5)

## 2015-10-03 LAB — BASIC METABOLIC PANEL
ANION GAP: 9 (ref 5–15)
BUN: 14 mg/dL (ref 6–20)
CALCIUM: 8.9 mg/dL (ref 8.9–10.3)
CO2: 28 mmol/L (ref 22–32)
CREATININE: 0.68 mg/dL (ref 0.44–1.00)
Chloride: 99 mmol/L — ABNORMAL LOW (ref 101–111)
Glucose, Bld: 152 mg/dL — ABNORMAL HIGH (ref 65–99)
Potassium: 3.9 mmol/L (ref 3.5–5.1)
SODIUM: 136 mmol/L (ref 135–145)

## 2015-10-03 LAB — TROPONIN I: TROPONIN I: 0.49 ng/mL — AB (ref <0.031)

## 2015-10-03 MED ORDER — ALBUTEROL SULFATE (2.5 MG/3ML) 0.083% IN NEBU: 2.5000 mg | INHALATION_SOLUTION | RESPIRATORY_TRACT | Status: DC | PRN

## 2015-10-03 MED ORDER — FUROSEMIDE 10 MG/ML IJ SOLN
40.0000 mg | Freq: Three times a day (TID) | INTRAMUSCULAR | Status: DC
  Administered 2015-10-03 – 2015-10-05 (×7): 40 mg via INTRAVENOUS
  Filled 2015-10-03 (×7): qty 4

## 2015-10-03 NOTE — Progress Notes (Signed)
[facility] PROGRESS NOTE   [name] [id number] DOB: July 22, 1928 DOA: 10/02/2015 PCP: Binnie Rail, MD  HPI/Subjective: Feels about the same, still short of breath but doesn't have labored breathing. Per respiratory therapy, patient did not tolerate the HFNC  Assessment/Plan: Principal Problem:   Acute and chronic respiratory failure with hypoxia (HCC) Active Problems:   Postinflammatory pulmonary fibrosis (HCC)   Chronic diastolic CHF (congestive heart failure) ([place])   DNR (do not resuscitate)   Acute on chronic respiratory failure ([place])   Acute on chronic respiratory failure with hypoxia Patient used to be on 2.5 L of oxygen about a month ago, she was discharged on 3-4 L of oxygen. Reported that her oxygen concentrator at home might be malfunctioning, they were unable to reach the home health agency. Placed on oxygen, keep saturation around 91%. Started on IV antibiotics in the ED, I will discontinue. No fever or chills, no impressive leukocytosis. Started on high dose of steroids and Lasix.  Acute on chronic diastolic CHF 2-D echo in September showed LVEF of 65-70% with grade 1 diastolic dysfunction. Has a slightly fluid overload and positive BNP of 483, I'll diurese with IV Lasix to keep negative fluid balance. Continue home medications. She also has moderate mitral valve stenosis. I will increase Lasix to 40 mg every 8 hours.  Positive troponin Slightly positive troponin of 0.19, this is likely secondary to the hypoxia and CHF. 3 sets of cardiac enzymes cycled, flat curve not consistent with ACS. Patient denies any chest pain, this is likely secondary to acute CHF and hypoxemia.  Pulmonary fibrosis Idiopathic pulmonary fibrosis, appears to be worsening overall. Oxygen requirements increased in the past 2 month all the way to 4 L. 4 admissions to the hospital in the past 6 months, EDP consulted palliative. Await palliative recommendation.  Code  Status: DNR Family Communication: Plan discussed with the patient. Disposition Plan: Remains inpatient Diet: Diet Heart Room service appropriate?: Yes; Fluid consistency:: Thin  Consultants:  None  Procedures:  None  Antibiotics:  None   Objective: Filed Vitals:   10/03/15 0057 10/03/15 0500  BP:  159/67  Pulse: 93 92  Temp:  98.3 F (36.8 C)  Resp: 16 20    Intake/Output Summary (Last 24 hours) at 10/03/15 1044 Last data filed at 10/03/15 0254  Gross per 24 hour  Intake  277.8 ml  Output   1800 ml  Net -1522.2 ml   Filed Weights   10/02/15 0900 10/02/15 1325 10/03/15 0500  Weight: 69.4 kg (153 lb) 66.5 kg (146 lb 9.7 oz) 64.2 kg (141 lb 8.6 oz)    Exam: General: Alert and awake, oriented x3, not in any acute distress. HEENT: anicteric sclera, pupils reactive to light and accommodation, EOMI CVS: S1-S2 clear, no murmur rubs or gallops Chest: clear to auscultation bilaterally, no wheezing, rales or rhonchi Abdomen: soft nontender, nondistended, normal bowel sounds, no organomegaly Extremities: no cyanosis, clubbing or edema noted bilaterally Neuro: Cranial nerves II-XII intact, no focal neurological deficits  Data Reviewed: Basic Metabolic Panel:  Recent Labs Lab 10/02/15 0755 10/03/15 0120  NA 139 136  K 4.1 3.9  CL 103 99*  CO2 30 28  GLUCOSE 121* 152*  BUN 13 14  CREATININE 0.64 0.68  CALCIUM 9.5 8.9   Liver Function Tests: No results for input(s): AST, ALT, ALKPHOS, BILITOT, PROT, ALBUMIN in the last 168 hours. No results for input(s): LIPASE, AMYLASE in the last 168 hours. No results for input(s): AMMONIA in the  last 168 hours. CBC:  Recent Labs Lab 10/02/15 0755 10/03/15 0120  WBC 11.3* 10.1  NEUTROABS 9.0*  --   HGB 11.3* 11.2*  HCT 35.1* 35.1*  MCV 90.7 90.5  PLT 192 229   Cardiac Enzymes:  Recent Labs Lab 10/02/15 1410 10/02/15 1941 10/03/15 0120  TROPONINI 0.59* 0.57* 0.49*   BNP (last 3 results)  Recent Labs   08/03/15 0600 08/31/15 1232 10/02/15 0755  BNP 278.6* 358.8* 483.7*    ProBNP (last 3 results) No results for input(s): PROBNP in the last 8760 hours.  CBG: No results for input(s): GLUCAP in the last 168 hours.  Micro Recent Results (from the past 240 hour(s))  Culture, blood (routine x 2)     Status: None (Preliminary result)   Collection Time: 10/02/15  9:50 AM  Result Value Ref Range Status   Specimen Description BLOOD RIGHT ANTECUBITAL  Final   Special Requests BOTTLES DRAWN AEROBIC AND ANAEROBIC 5CC  Final   Culture   Final    NO GROWTH < 24 HOURS Performed at [facility]    Report Status PENDING  Incomplete  Culture, blood (routine x 2)     Status: None (Preliminary result)   Collection Time: 10/02/15  9:50 AM  Result Value Ref Range Status   Specimen Description BLOOD BLOOD LEFT HAND  Final   Special Requests IN PEDIATRIC BOTTLE 3CC  Final   Culture   Final    NO GROWTH < 24 HOURS Performed at [facility]    Report Status PENDING  Incomplete     Studies: Dg Chest Port 1 View  10/02/2015  CLINICAL DATA:  Worsening shortness of breath since last night. History of pulmonary fibrosis. EXAM: PORTABLE CHEST 1 VIEW COMPARISON:  09/05/2015, 08/31/2015 and chest CT 08/31/2015 FINDINGS: Lungs are somewhat hypoinflated with persistent bilateral diffuse interstitial changes compatible with known pulmonary fibrosis. There is worsening patchy mixed interstitial airspace density bilaterally right worse than left suggesting superimposed pneumonia. No evidence of effusion. Mild cardiomegaly. Calcified plaque over the thoracic aorta. There are degenerative changes of the spine. IMPRESSION: Hypoinflation with evidence of known underlying chronic fibrosis. Worsening bilateral patchy mixed interstitial airspace density likely superimposed pneumonia. Mild cardiomegaly. Electronically Signed   By: Marin Olp M.D.   On: 10/02/2015 08:53    Scheduled Meds: . antiseptic  oral rinse  7 mL Mouth Rinse BID  . aspirin EC  325 mg Oral Daily  . enoxaparin (LOVENOX) injection  40 mg Subcutaneous Q24H  . fluticasone  2 spray Each Nare BID  . furosemide  40 mg Intravenous BID  . guaiFENesin  1,200 mg Oral BID  . ipratropium-albuterol  3 mL Nebulization QID  . magnesium gluconate  250 mg Oral QHS  . methylPREDNISolone (SOLU-MEDROL) injection  80 mg Intravenous 3 times per day  . pantoprazole  40 mg Oral Daily  . polyethylene glycol  17 g Oral Daily  . sertraline  100 mg Oral Q breakfast   Continuous Infusions: . sodium chloride 10 mL/hr at 10/02/15 1742       Time spent: 35 minutes    [facility]  [facility] Pager [phone] If 7PM-7AM, please contact night-coverage at [email or web address], password [facility] 10/03/2015, 10:44 AM  LOS: 1 day

## 2015-10-03 NOTE — Progress Notes (Signed)
High flow nasal cannula @ 8 lpm was attempted, but pt unable to tolerate.  SPo2 93-94%.  Pt placed back on 55% venturi mask.

## 2015-10-03 NOTE — Progress Notes (Signed)
Pt tolerating HFNC at this time.

## 2015-10-03 NOTE — Progress Notes (Signed)
Utilization review completed.  

## 2015-10-04 ENCOUNTER — Ambulatory Visit: Payer: Medicare Other | Admitting: Gastroenterology

## 2015-10-04 LAB — CBC
HEMATOCRIT: 36.1 % (ref 36.0–46.0)
Hemoglobin: 11.9 g/dL — ABNORMAL LOW (ref 12.0–15.0)
MCH: 29.4 pg (ref 26.0–34.0)
MCHC: 33 g/dL (ref 30.0–36.0)
MCV: 89.1 fL (ref 78.0–100.0)
PLATELETS: 292 10*3/uL (ref 150–400)
RBC: 4.05 MIL/uL (ref 3.87–5.11)
RDW: 15.4 % (ref 11.5–15.5)
WBC: 15.2 10*3/uL — ABNORMAL HIGH (ref 4.0–10.5)

## 2015-10-04 LAB — BASIC METABOLIC PANEL
Anion gap: 7 (ref 5–15)
BUN: 20 mg/dL (ref 6–20)
CHLORIDE: 95 mmol/L — AB (ref 101–111)
CO2: 33 mmol/L — ABNORMAL HIGH (ref 22–32)
CREATININE: 0.68 mg/dL (ref 0.44–1.00)
Calcium: 9 mg/dL (ref 8.9–10.3)
GFR calc Af Amer: >60 mL/min (ref >60)
GFR calc non Af Amer: >60 mL/min (ref >60)
GLUCOSE: 149 mg/dL — AB (ref 65–99)
POTASSIUM: 3.8 mmol/L (ref 3.5–5.1)
Sodium: 135 mmol/L (ref 135–145)

## 2015-10-04 LAB — HEMOGLOBIN A1C
Hgb A1c MFr Bld: 6 % — ABNORMAL HIGH (ref 4.8–5.6)
Mean Plasma Glucose: 126 mg/dL

## 2015-10-04 MED ORDER — LIP MEDEX EX OINT
TOPICAL_OINTMENT | CUTANEOUS | Status: DC | PRN
  Administered 2015-10-04: 22:00:00 via TOPICAL
  Filled 2015-10-04: qty 7

## 2015-10-04 NOTE — Evaluation (Signed)
Physical Therapy Evaluation Patient Details Name: [name] MRN: [id number] DOB: [birth year] Today's Date: 10/04/2015   History of Present Illness  81 yo female admitted with acute respiratory failure. Hx of pulm fibrosis, CHF, HTN. Pt is from home alone. Recent d/c from SNF  Clinical Impression  On eval, pt required Min guard assist for mobility-walked ~60 feet with RW. Pt on 5L O2 at rest. O2 sats dropped to 85% on 6L O2 during ambulation. Pt will likely require supervision/assist at home.     Follow Up Recommendations SNF vs Home health PT;Supervision/Assistance - 24 hour (depending on progress and assist available at home)    Equipment Recommendations  None recommended by PT    Recommendations for Other Services       Precautions / Restrictions Precautions Precautions: Fall Precaution Comments: O2 dep-monitior sats      Mobility  Bed Mobility Overal bed mobility: Modified Independent       Supine to sit: HOB elevated;Modified independent (Device/Increase time) Sit to supine: Modified independent (Device/Increase time);HOB elevated      Transfers Overall transfer level: Needs assistance Equipment used: Rolling walker (2 wheeled) Transfers: Sit to/from Stand Sit to Stand: Min guard         General transfer comment: close guard for safety  Ambulation/Gait Ambulation/Gait assistance: Min guard Ambulation Distance (Feet): 75 Feet Assistive device: Rolling walker (2 wheeled) Gait Pattern/deviations: Step-through pattern;Decreased stride length     General Gait Details: close guard for safety. O2 sats 85% on 6L O2 during ambulation. VCs for pursed lip/deep breathing. dyspnea 2/4  Stairs            Wheelchair Mobility    Modified Rankin (Stroke Patients Only)       Balance           Standing balance support: During functional activity                                 Pertinent Vitals/Pain Pain Assessment: No/denies pain     Home Living Family/patient expects to be discharged to:: Private residence Living Arrangements: Alone Available Help at Discharge: Family;Friend(s);Available PRN/intermittently Type of Home: House Home Access: Stairs to enter Entrance Stairs-Rails: Right Entrance Stairs-Number of Steps: 2 Home Layout: One level Home Equipment: Walker - 2 wheels;Cane - single point;Grab bars - tub/shower;Shower seat;Toilet riser Additional Comments: doesn't use toilet riser or shower seat, one commode is standard, one is handicapped height    Prior Function Level of Independence: Independent         Comments: drives, walks short distances due to SOB, denies h/o falls     Hand Dominance        Extremity/Trunk Assessment   Upper Extremity Assessment: Generalized weakness           Lower Extremity Assessment: Generalized weakness      Cervical / Trunk Assessment: Normal  Communication   Communication: No difficulties  Cognition Arousal/Alertness: Awake/alert Behavior During Therapy: WFL for tasks assessed/performed Overall Cognitive Status: Within Functional Limits for tasks assessed                      General Comments      Exercises        Assessment/Plan    PT Assessment Patient needs continued PT services  PT Diagnosis Difficulty walking;Generalized weakness   PT Problem List Decreased strength;Decreased activity tolerance;Decreased balance;Decreased mobility  PT Treatment Interventions DME instruction;Gait  training;Functional mobility training;Therapeutic activities;Patient/family education;Balance training;Therapeutic exercise   PT Goals (Current goals can be found in the Care Plan section) Acute Rehab PT Goals Patient Stated Goal: to get stronger PT Goal Formulation: With patient/family Time For Goal Achievement: 10/18/15 Potential to Achieve Goals: Fair    Frequency Min 3X/week   Barriers to discharge        Co-evaluation                End of Session Equipment Utilized During Treatment: Oxygen;Gait belt Activity Tolerance: Patient tolerated treatment well Patient left: in bed;with call bell/phone within reach;with bed alarm set           Time: [id number] PT Time Calculation (min) (ACUTE ONLY): 17 min   Charges:   PT Evaluation $Initial PT Evaluation Tier I: 1 Procedure     PT G Codes:        Weston Anna, MPT Pager: [phone]

## 2015-10-04 NOTE — NC FL2 (Signed)
[place] LEVEL OF CARE SCREENING TOOL     IDENTIFICATION  Patient Name: [name] Birthdate: [birth year] Sex: female Admission Date (Current Location): 10/02/2015  [place] and [place] Number: [facility] and Address:  [facility],  [place] [place] [address], [place]      Provider Number: [id number]  Attending Physician Name and Address:  Verlee Monte, MD  Relative Name and Phone Number:       Current Level of Care: Hospital Recommended Level of Care: [place] Prior Approval Number:    Date Approved/Denied:   PASRR Number:    Discharge Plan: SNF    Current Diagnoses: Patient Active Problem List   Diagnosis Date Noted  . Acute and chronic respiratory failure with hypoxia ([place]) 10/02/2015  . Acute on chronic respiratory failure ([place]) 10/02/2015  . Dysphagia   . Pyrexia   . Arterial hypotension   . Sepsis ([place]) 09/05/2015  . Palliative care encounter 09/03/2015  . DNR (do not resuscitate) 09/03/2015  . Bronchitis, chronic obstructive w acute bronchitis ([place]) 09/02/2015  . Acute bronchitis   . HCAP (healthcare-associated pneumonia) 09/01/2015  . Dyspnea 08/31/2015  . Acute respiratory failure with hypoxia ([place]) 08/31/2015  . Hypoxia   . Pain   . Depression 08/22/2015  . Chronic hyponatremia 08/03/2015  . Pulmonary fibrosis ([place]) 08/01/2015  . Chronic diastolic CHF (congestive heart failure) ([place]) 08/01/2015  . SOB (shortness of breath) 08/01/2015  . Exercise hypoxemia 07/28/2015  . Obesity 07/01/2015  . Hyperlipidemia 09/17/2014  . Coronary artery disease 08/17/2014  . Leukocytosis 08/17/2014  . Protein-calorie malnutrition ([place]) 08/17/2014  . Postinflammatory pulmonary fibrosis ([place]) 08/16/2014  . Change in bowel habits 07/24/2012  . Special screening for malignant neoplasms, colon 12/08/2011  . Stricture and stenosis of esophagus 12/08/2011  . NEOPLASM, MALIGNANT, RIGHT BREAST 11/28/2010  .  OBSTRUCTIVE SLEEP APNEA 11/28/2010  . Essential hypertension 11/28/2010  . ALLERGIC RHINITIS 11/28/2010  . GERD 11/28/2010    Orientation ACTIVITIES/SOCIAL BLADDER RESPIRATION           O2 (As needed) (6L HFNC- continuing attempts to wean)  BEHAVIORAL SYMPTOMS/MOOD NEUROLOGICAL BOWEL NUTRITION STATUS     (NONE)      PHYSICIAN VISITS COMMUNICATION OF NEEDS Height & Weight Skin        139 lbs.            AMBULATORY STATUS RESPIRATION    Supervision limited (+1 assist) O2 (As needed) (6L HFNC- continuing attempts to wean)      Personal Care Assistance Level of Assistance               Functional Limitations Info                SPECIAL CARE FACTORS FREQUENCY                      Additional Factors Info                  Current Medications (10/04/2015):  This is the current hospital active medication list Current Facility-Administered Medications  Medication Dose Route Frequency Provider Last Rate Last Dose  . acetaminophen (TYLENOL) tablet 650 mg  650 mg Oral Q6H PRN Verlee Monte, MD       Or  . acetaminophen (TYLENOL) suppository 650 mg  650 mg Rectal Q6H PRN Verlee Monte, MD      . albuterol (PROVENTIL) (2.5 MG/3ML) 0.083% nebulizer solution 2.5 mg  2.5 mg  Nebulization Q4H PRN Dianne Dun, NP      . antiseptic oral rinse (CPC / CETYLPYRIDINIUM CHLORIDE 0.05%) solution 7 mL  7 mL Mouth Rinse BID Verlee Monte, MD   7 mL at 10/04/15 0941  . aspirin EC tablet 325 mg  325 mg Oral Daily Verlee Monte, MD   325 mg at 10/04/15 0947  . benzonatate (TESSALON) capsule 200 mg  200 mg Oral TID PRN Verlee Monte, MD   200 mg at 10/04/15 0150  . enoxaparin (LOVENOX) injection 40 mg  40 mg Subcutaneous Q24H Verlee Monte, MD   40 mg at 10/03/15 2241  . fluticasone (FLONASE) 50 MCG/ACT nasal spray 2 spray  2 spray Each Nare BID Verlee Monte, MD   2 spray at 10/04/15 0940  . furosemide (LASIX) injection 40 mg  40 mg Intravenous 3 times per day Verlee Monte, MD    40 mg at 10/04/15 1438  . guaiFENesin (MUCINEX) 12 hr tablet 1,200 mg  1,200 mg Oral BID Verlee Monte, MD   1,200 mg at 10/04/15 0939  . HYDROcodone-acetaminophen (NORCO/VICODIN) 5-325 MG per tablet 1-2 tablet  1-2 tablet Oral Q4H PRN Verlee Monte, MD      . ipratropium-albuterol (DUONEB) 0.5-2.5 (3) MG/3ML nebulizer solution 3 mL  3 mL Nebulization QID Verlee Monte, MD   3 mL at 10/04/15 1201  . magnesium gluconate (MAGONATE) tablet 250 mg  250 mg Oral QHS Verlee Monte, MD   250 mg at 10/03/15 2236  . methylPREDNISolone sodium succinate (SOLU-MEDROL) 125 mg/2 mL injection 80 mg  80 mg Intravenous 3 times per day Verlee Monte, MD   80 mg at 10/04/15 1438  . morphine 2 MG/ML injection 1 mg  1 mg Intravenous Q4H PRN Verlee Monte, MD      . ondansetron (ZOFRAN) tablet 4 mg  4 mg Oral Q6H PRN Verlee Monte, MD       Or  . ondansetron (ZOFRAN) injection 4 mg  4 mg Intravenous Q6H PRN Verlee Monte, MD      . pantoprazole (PROTONIX) EC tablet 40 mg  40 mg Oral Daily Verlee Monte, MD   40 mg at 10/02/15 1436  . polyethylene glycol (MIRALAX / GLYCOLAX) packet 17 g  17 g Oral Daily Verlee Monte, MD   17 g at 10/04/15 0940  . senna-docusate (Senokot-S) tablet 1 tablet  1 tablet Oral QHS PRN Verlee Monte, MD      . sertraline (ZOLOFT) tablet 100 mg  100 mg Oral Q breakfast Verlee Monte, MD   100 mg at 10/04/15 0940  . sodium chloride (OCEAN) 0.65 % nasal spray 1 spray  1 spray Each Nare Daily PRN Verlee Monte, MD   1 spray at 10/02/15 1601  . traMADol (ULTRAM) tablet 50 mg  50 mg Oral Q6H PRN Verlee Monte, MD         Discharge Medications: Please see discharge summary for a list of discharge medications.  Relevant Imaging Results:  Relevant Lab Results:  Recent Labs    Additional Information    KIDD, SUZANNA A, LCSW

## 2015-10-04 NOTE — Progress Notes (Signed)
[facility]  Patient Status: Active (receiving services up to time of hospitalization)  [facility] is providing the following services: RN, PT, OT and HHA  If patient discharges after hours, please call [phone].   [name] 10/04/2015, 10:57 AM

## 2015-10-04 NOTE — Progress Notes (Signed)
[facility] PROGRESS NOTE   [name] [id number] DOB: 1928/05/16 DOA: 10/02/2015 PCP: Binnie Rail, MD  HPI/Subjective: Was able to decrease oxygen he is to the 5-6 L of oxygen through high flownasal cannula. Patient is very anxious about going back home. Wants to go to nursing home.  Assessment/Plan: Principal Problem:   Acute and chronic respiratory failure with hypoxia (HCC) Active Problems:   Postinflammatory pulmonary fibrosis (HCC)   Chronic diastolic CHF (congestive heart failure) ([place])   DNR (do not resuscitate)   Acute on chronic respiratory failure ([place])   Acute on chronic respiratory failure with hypoxia Patient used to be on 2.5 L of oxygen about a month ago, she was discharged on 3-4 L of oxygen. Reported that her oxygen concentrator at home might be malfunctioning, they were unable to reach the home health agency. Placed on oxygen, keep saturation around 91%. Started on IV antibiotics in the ED, I will discontinue. No fever or chills, no impressive leukocytosis. Started on high dose of steroids and Lasix. Continue current management.  Acute on chronic diastolic CHF 2-D echo in September showed LVEF of 65-70% with grade 1 diastolic dysfunction. Has fluid overload and positive BNP of 483, I'll diurese with IV Lasix to keep negative fluid balance. Continue home medications. She also has moderate mitral valve stenosis. Continue Lasix, check BMP in a.m. continue to monitor intake/output and weight.  Positive troponin Slightly positive troponin of 0.19, this is likely secondary to the hypoxia and CHF. 3 sets of cardiac enzymes cycled, flat curve not consistent with ACS. Patient denies any chest pain, this is likely secondary to acute CHF and hypoxemia.  Pulmonary fibrosis Idiopathic pulmonary fibrosis, appears to be worsening overall. Oxygen requirements increased in the past 2 month all the way to 4 L. 4 admissions to the hospital in the past 6  months, EDP consulted palliative. Await palliative recommendation.  Code Status: DNR Family Communication: Plan discussed with the patient. Disposition Plan: Remains inpatient Diet: Diet Heart Room service appropriate?: Yes; Fluid consistency:: Thin  Consultants:  None  Procedures:  None  Antibiotics:  None   Objective: Filed Vitals:   10/03/15 2140 10/04/15 0524  BP: 136/66 157/68  Pulse: 86 83  Temp: 98 F (36.7 C) 97.9 F (36.6 C)  Resp: 20 20    Intake/Output Summary (Last 24 hours) at 10/04/15 1128 Last data filed at 10/04/15 [id number]  Gross per 24 hour  Intake    480 ml  Output   4650 ml  Net  -4170 ml   Filed Weights   10/02/15 1325 10/03/15 0500 10/04/15 0524  Weight: 66.5 kg (146 lb 9.7 oz) 64.2 kg (141 lb 8.6 oz) 63.5 kg (139 lb 15.9 oz)    Exam: General: Alert and awake, oriented x3, not in any acute distress. HEENT: anicteric sclera, pupils reactive to light and accommodation, EOMI CVS: S1-S2 clear, no murmur rubs or gallops Chest: clear to auscultation bilaterally, no wheezing, rales or rhonchi Abdomen: soft nontender, nondistended, normal bowel sounds, no organomegaly Extremities: no cyanosis, clubbing or edema noted bilaterally Neuro: Cranial nerves II-XII intact, no focal neurological deficits  Data Reviewed: Basic Metabolic Panel:  Recent Labs Lab 10/02/15 0755 10/03/15 0120 10/04/15 0416  NA 139 136 135  K 4.1 3.9 3.8  CL 103 99* 95*  CO2 30 28 33*  GLUCOSE 121* 152* 149*  BUN 13 14 20   CREATININE 0.64 0.68 0.68  CALCIUM 9.5 8.9 9.0   Liver Function Tests: No results for  input(s): AST, ALT, ALKPHOS, BILITOT, PROT, ALBUMIN in the last 168 hours. No results for input(s): LIPASE, AMYLASE in the last 168 hours. No results for input(s): AMMONIA in the last 168 hours. CBC:  Recent Labs Lab 10/02/15 0755 10/03/15 0120 10/04/15 0416  WBC 11.3* 10.1 15.2*  NEUTROABS 9.0*  --   --   HGB 11.3* 11.2* 11.9*  HCT 35.1* 35.1* 36.1    MCV 90.7 90.5 89.1  PLT 192 229 292   Cardiac Enzymes:  Recent Labs Lab 10/02/15 1410 10/02/15 1941 10/03/15 0120  TROPONINI 0.59* 0.57* 0.49*   BNP (last 3 results)  Recent Labs  08/03/15 0600 08/31/15 1232 10/02/15 0755  BNP 278.6* 358.8* 483.7*    ProBNP (last 3 results) No results for input(s): PROBNP in the last 8760 hours.  CBG: No results for input(s): GLUCAP in the last 168 hours.  Micro Recent Results (from the past 240 hour(s))  Culture, blood (routine x 2)     Status: None (Preliminary result)   Collection Time: 10/02/15  9:50 AM  Result Value Ref [place] Status   Specimen Description BLOOD RIGHT ANTECUBITAL  Final   Special Requests BOTTLES DRAWN AEROBIC AND ANAEROBIC 5CC  Final   Culture   Final    NO GROWTH < 24 HOURS Performed at [facility]    Report Status PENDING  Incomplete  Culture, blood (routine x 2)     Status: None (Preliminary result)   Collection Time: 10/02/15  9:50 AM  Result Value Ref Range Status   Specimen Description BLOOD BLOOD LEFT HAND  Final   Special Requests IN PEDIATRIC BOTTLE 3CC  Final   Culture   Final    NO GROWTH < 24 HOURS Performed at [facility]    Report Status PENDING  Incomplete     Studies: No results found.  Scheduled Meds: . antiseptic oral rinse  7 mL Mouth Rinse BID  . aspirin EC  325 mg Oral Daily  . enoxaparin (LOVENOX) injection  40 mg Subcutaneous Q24H  . fluticasone  2 spray Each Nare BID  . furosemide  40 mg Intravenous 3 times per day  . guaiFENesin  1,200 mg Oral BID  . ipratropium-albuterol  3 mL Nebulization QID  . magnesium gluconate  250 mg Oral QHS  . methylPREDNISolone (SOLU-MEDROL) injection  80 mg Intravenous 3 times per day  . pantoprazole  40 mg Oral Daily  . polyethylene glycol  17 g Oral Daily  . sertraline  100 mg Oral Q breakfast   Continuous Infusions:       Time spent: 35 minutes    [facility]  [facility] Pager [phone] If 7PM-7AM,  please contact night-coverage at [email or web address], password [facility] 10/04/2015, 11:28 AM  LOS: 2 days

## 2015-10-05 DIAGNOSIS — G4733 Obstructive sleep apnea (adult) (pediatric): Secondary | ICD-10-CM | POA: Diagnosis not present

## 2015-10-05 DIAGNOSIS — Z853 Personal history of malignant neoplasm of breast: Secondary | ICD-10-CM | POA: Diagnosis not present

## 2015-10-05 DIAGNOSIS — J8410 Pulmonary fibrosis, unspecified: Secondary | ICD-10-CM | POA: Diagnosis not present

## 2015-10-05 DIAGNOSIS — I509 Heart failure, unspecified: Secondary | ICD-10-CM | POA: Diagnosis not present

## 2015-10-05 DIAGNOSIS — K219 Gastro-esophageal reflux disease without esophagitis: Secondary | ICD-10-CM | POA: Diagnosis not present

## 2015-10-05 DIAGNOSIS — R509 Fever, unspecified: Secondary | ICD-10-CM | POA: Diagnosis not present

## 2015-10-05 DIAGNOSIS — F0390 Unspecified dementia without behavioral disturbance: Secondary | ICD-10-CM | POA: Diagnosis not present

## 2015-10-05 DIAGNOSIS — Z515 Encounter for palliative care: Secondary | ICD-10-CM

## 2015-10-05 DIAGNOSIS — J9621 Acute and chronic respiratory failure with hypoxia: Secondary | ICD-10-CM | POA: Diagnosis not present

## 2015-10-05 DIAGNOSIS — I10 Essential (primary) hypertension: Secondary | ICD-10-CM | POA: Diagnosis not present

## 2015-10-05 DIAGNOSIS — Z66 Do not resuscitate: Secondary | ICD-10-CM | POA: Diagnosis not present

## 2015-10-05 DIAGNOSIS — E871 Hypo-osmolality and hyponatremia: Secondary | ICD-10-CM | POA: Diagnosis not present

## 2015-10-05 DIAGNOSIS — R1310 Dysphagia, unspecified: Secondary | ICD-10-CM | POA: Diagnosis not present

## 2015-10-05 DIAGNOSIS — J9611 Chronic respiratory failure with hypoxia: Secondary | ICD-10-CM | POA: Diagnosis not present

## 2015-10-05 DIAGNOSIS — I5032 Chronic diastolic (congestive) heart failure: Secondary | ICD-10-CM | POA: Diagnosis not present

## 2015-10-05 DIAGNOSIS — J811 Chronic pulmonary edema: Secondary | ICD-10-CM | POA: Diagnosis not present

## 2015-10-05 DIAGNOSIS — R531 Weakness: Secondary | ICD-10-CM

## 2015-10-05 DIAGNOSIS — I5030 Unspecified diastolic (congestive) heart failure: Secondary | ICD-10-CM | POA: Diagnosis not present

## 2015-10-05 DIAGNOSIS — R2689 Other abnormalities of gait and mobility: Secondary | ICD-10-CM | POA: Diagnosis not present

## 2015-10-05 DIAGNOSIS — F329 Major depressive disorder, single episode, unspecified: Secondary | ICD-10-CM | POA: Diagnosis not present

## 2015-10-05 DIAGNOSIS — E669 Obesity, unspecified: Secondary | ICD-10-CM | POA: Diagnosis not present

## 2015-10-05 DIAGNOSIS — E46 Unspecified protein-calorie malnutrition: Secondary | ICD-10-CM | POA: Diagnosis not present

## 2015-10-05 DIAGNOSIS — J9620 Acute and chronic respiratory failure, unspecified whether with hypoxia or hypercapnia: Secondary | ICD-10-CM | POA: Diagnosis not present

## 2015-10-05 DIAGNOSIS — I2510 Atherosclerotic heart disease of native coronary artery without angina pectoris: Secondary | ICD-10-CM | POA: Diagnosis not present

## 2015-10-05 DIAGNOSIS — I959 Hypotension, unspecified: Secondary | ICD-10-CM | POA: Diagnosis not present

## 2015-10-05 DIAGNOSIS — J309 Allergic rhinitis, unspecified: Secondary | ICD-10-CM | POA: Diagnosis not present

## 2015-10-05 DIAGNOSIS — E785 Hyperlipidemia, unspecified: Secondary | ICD-10-CM | POA: Diagnosis not present

## 2015-10-05 DIAGNOSIS — G4700 Insomnia, unspecified: Secondary | ICD-10-CM | POA: Diagnosis not present

## 2015-10-05 DIAGNOSIS — J9600 Acute respiratory failure, unspecified whether with hypoxia or hypercapnia: Secondary | ICD-10-CM | POA: Diagnosis not present

## 2015-10-05 DIAGNOSIS — M150 Primary generalized (osteo)arthritis: Secondary | ICD-10-CM | POA: Diagnosis not present

## 2015-10-05 DIAGNOSIS — D558 Other anemias due to enzyme disorders: Secondary | ICD-10-CM | POA: Diagnosis not present

## 2015-10-05 LAB — BASIC METABOLIC PANEL
ANION GAP: 11 (ref 5–15)
BUN: 23 mg/dL — ABNORMAL HIGH (ref 6–20)
CHLORIDE: 94 mmol/L — AB (ref 101–111)
CO2: 31 mmol/L (ref 22–32)
Calcium: 8.9 mg/dL (ref 8.9–10.3)
Creatinine, Ser: 0.65 mg/dL (ref 0.44–1.00)
GFR calc non Af Amer: >60 mL/min (ref >60)
GLUCOSE: 158 mg/dL — AB (ref 65–99)
POTASSIUM: 3.8 mmol/L (ref 3.5–5.1)
Sodium: 136 mmol/L (ref 135–145)

## 2015-10-05 LAB — CBC
HEMATOCRIT: 38.9 % (ref 36.0–46.0)
HEMOGLOBIN: 12.3 g/dL (ref 12.0–15.0)
MCH: 28.7 pg (ref 26.0–34.0)
MCHC: 31.6 g/dL (ref 30.0–36.0)
MCV: 90.9 fL (ref 78.0–100.0)
Platelets: 312 10*3/uL (ref 150–400)
RBC: 4.28 MIL/uL (ref 3.87–5.11)
RDW: 15.4 % (ref 11.5–15.5)
WBC: 11.8 10*3/uL — ABNORMAL HIGH (ref 4.0–10.5)

## 2015-10-05 MED ORDER — TRAMADOL HCL 50 MG PO TABS: 50.0000 mg | ORAL_TABLET | Freq: Four times a day (QID) | ORAL | Status: DC | PRN

## 2015-10-05 MED ORDER — POTASSIUM CHLORIDE CRYS ER 20 MEQ PO TBCR: 20.0000 meq | EXTENDED_RELEASE_TABLET | Freq: Every day | ORAL | Status: DC

## 2015-10-05 MED ORDER — POTASSIUM CHLORIDE CRYS ER 20 MEQ PO TBCR
40.0000 meq | EXTENDED_RELEASE_TABLET | Freq: Once | ORAL | Status: AC
Start: 2015-10-05 — End: 2015-10-05
  Administered 2015-10-05: 40 meq via ORAL
  Filled 2015-10-05: qty 2

## 2015-10-05 MED ORDER — FUROSEMIDE 40 MG PO TABS: 40.0000 mg | ORAL_TABLET | Freq: Two times a day (BID) | ORAL | Status: DC

## 2015-10-05 NOTE — Progress Notes (Signed)
CSW continuing to follow.   CSW followed up with pt and pt family following PMT [place] meeting. Pt and pt family have agreed for rehab at [facility] with palliative care to follow.  CSW provided supportive listening as pt and pt family expressed frustration surrounding the discharge process and feeling rushed. Pt family expressed that they may consider appealing pt discharge dependent on what facilities are available.  CSW discussed SNF bed offers. Pt was given multiple options, but pt and pt family expressed most interest in [facility] and [facility] as both facilities have private rooms. Pt and pt family weighed pros and cons of each facility and were in agreement for a private room at [facility] [place] and are in agreement to discharge today as it cannot be guaranteed that bed will be available after today.  CSW contacted [facility] and confirmed that facility could accept pt today and had private room available.  CSW facilitated pt discharge needs including contacting facility, faxing pt discharge information via [place], discussing with pt at bedside and pt brother, Clair Gulling, providing RN phone number to call report, and arranging ambulance transport for pt to [facility].  No further social work needs identified at this time.  CSW signing off.   Alison Murray, MSW, [place] Work [phone]

## 2015-10-05 NOTE — Care Management Note (Signed)
Case Management Note  Patient Details  Name: [name] MRN: [id number] Date of Birth: 1928/02/25  Subjective/Objective:      81 yo admitted with Acute and Chronic respiratory failure              Action/Plan: From home alone with [facility] HH services  Expected Discharge Date:                  Expected Discharge Plan:  Skilled Nursing Facility  In-House Referral:  Clinical Social Work  Discharge planning Services  CM Consult  Post Acute Care Choice:    Choice offered to:     DME Arranged:    DME Agency:     HH Arranged:    [place] Agency:     Status of Service:  Completed, signed off  Medicare Important Message Given:  Yes Date Medicare IM Given:    Medicare IM give by:    Date Additional Medicare IM Given:    Additional Medicare Important Message give by:     If discussed at [place] of Stay Meetings, dates discussed:    Additional Comments: Pt for SNF placement Viann Nielson, Marjie Skiff, RN 10/05/2015, 1:45 PM

## 2015-10-05 NOTE — Consult Note (Signed)
Consultation Note Date: 10/05/2015   Patient Name: [name]  DOB: 1928-01-23  MRN: [id number]  Age / Sex: 81 y.o., female  PCP: Binnie Rail, MD Referring Physician: Verlee Monte, MD  Reason for Consultation: Disposition, Establishing goals of care, Family-Clinician negotiation and Psychosocial/spiritual support    Clinical Assessment/Narrative:  [name] is a 81 y.o. female with past medical history of pulmonary fibrosis, chronic respiratory failure home oxygen between 2 and 3 L. Patient admitted to the hospital 3 times in the past 6 month (this is the fourth time). Patient discharged from the hospital on 09/11/15 to a nursing home from which she was discharged 3 days ago, she was on 3 L of oxygen at the nursing home, after she got home patient felt short of breath, her oxygen concentrator is set at 2.5 L, she tries to increase it and she reported the concentrator was beeping and a yellow light went off last night. So she came into the hospital complaining about shortness of breath.   In the ED patient was hypoxic at 84% on 3 L, placed on nonrebreather mask, was able to go to down to 45% on Ventimask. Patient admitted to the floor for further evaluation. She is DNR/DNI, apparently interested in exploring palliative route, palliative was called by the [facility] is for review of medical treatment options, clarification of goals of care and end of life issues, disposition and options, and symptom recommendation.  Continued physical and functional decline 2/2 to pulmonary disease and overall failure to thrive, multiple hospitalizations.  This NP Wadie Lessen reviewed medical records, received report from team, assessed the patient and then meet at the patient's bedside along with her brother and his wife and a neice to discuss diagnosis prognosis, GOC, EOL wishes disposition and options.   A detailed  discussion was had today regarding advanced directives.  Concepts specific to code status, artifical feeding and hydration, continued IV antibiotics and rehospitalization was had.  The difference between a aggressive medical intervention path  and a palliative comfort care path for this patient at this time was had.  Values and goals of care important to patient and family were attempted to be elicited.  Concept of Hospice and Palliative Care were discussed  Natural trajectory and expectations at EOL were discussed.  Questions and concerns addressed.  Family encouraged to call with questions or concerns.  PMT will continue to support holistically.  Patient and her family verbalize frustration with overall coordination of medical care and limited disposition/home care options.  They tell me they need a few more days to visit facilities  before they will feel comfortable with a disposition.    Contacts/Participants in Discussion: Primary Decision Maker: self with support of her brother/HPOA   HCPOA: yes     SUMMARY OF RECOMMENDATIONS  -pateint and family agree to discharge to SNF for rehabilitation, they are requesting a few more days for decision making.  SW and CMRN aware and will discuss with family    Code Status/Advance Care Planning: DNR    Code Status Orders        Start     Ordered   10/02/15 1335  Do not attempt resuscitation (DNR)   Continuous    Question Answer Comment  In the event of cardiac or respiratory ARREST Do not call a "code blue"   In the event of cardiac or respiratory ARREST Do not perform Intubation, CPR, defibrillation or ACLS   In the event of cardiac or  respiratory ARREST Use medication by any route, position, wound care, and other measures to relive pain and suffering. May use oxygen, suction and manual treatment of airway obstruction as needed for comfort.      10/02/15 1335    Advance Directive Documentation        Most Recent Value   Type of  Advance Directive  Healthcare Power of Attorney   Pre-existing out of facility DNR order (yellow form or pink MOST form)     "MOST" Form in Place?        Other Directives:Advanced Directive and Living Will  Symptom Management:   Weakness: SNF for rehabilitation  Palliative Prophylaxis:    Bowel Regimen, Delirium Protocol, Frequent Pain Assessment, Oral Care and Turn Reposition   Psycho-social/Spiritual:  Support System: [place] Desire for further Chaplaincy support:no Additional Recommendations: Education on Hospice  Prognosis: < six months  Discharge Planning: [facility] for rehab with Palliative care service follow-up   Chief Complaint/ Primary Diagnoses: Present on Admission:  . Chronic diastolic CHF (congestive heart failure) ([place]) . Acute and chronic respiratory failure with hypoxia ([place]) . DNR (do not resuscitate) . Postinflammatory pulmonary fibrosis ([place]) . Acute on chronic respiratory failure ([place])  I have reviewed the medical record, interviewed the patient and family, and examined the patient. The following aspects are pertinent.  Past Medical History  Diagnosis Date  . Dyspnea   . Diverticulosis 2008  . Internal hemorrhoids   . Esophageal stricture   . Arthritis   . Breast cancer ([place]) 1969  . GERD (gastroesophageal reflux disease)   . Gallstones   . Depression   . HLD (hyperlipidemia)   . HTN (hypertension)   . Obesity   . Colon polyp 2013    TUBULAR ADENOMA (X1  . Pulmonary fibrosis ([place])   . CAD (coronary artery disease)   . Chronic diastolic CHF (congestive heart failure) ([place]) 08/01/2015   Social History   Social History  . Marital Status: Married    Spouse Name: N/A  . Number of Children: 0  . Years of Education: N/A   Occupational History  . retired    Social History Main Topics  . Smoking status: Never Smoker   . Smokeless tobacco: Never Used  . Alcohol Use: 4.2 oz/week    7 Glasses of wine per week     Comment:  ocaasional  . Drug Use: No  . Sexual Activity: Not Asked   Other Topics Concern  . None   Social History Narrative   Family History  Problem Relation Age of Onset  . Heart disease Mother   . Cancer Mother     bladder  . Esophageal cancer Brother   . Prostate cancer Brother   . Heart disease Brother   . Cancer Brother     sarcoma  . Stomach cancer Neg Hx    Scheduled Meds: . antiseptic oral rinse  7 mL Mouth Rinse BID  . aspirin EC  325 mg Oral Daily  . enoxaparin (LOVENOX) injection  40 mg Subcutaneous Q24H  . fluticasone  2 spray Each Nare BID  . furosemide  40 mg Intravenous 3 times per day  . guaiFENesin  1,200 mg Oral BID  . ipratropium-albuterol  3 mL Nebulization QID  . magnesium gluconate  250 mg Oral QHS  . methylPREDNISolone (SOLU-MEDROL) injection  80 mg Intravenous 3 times per day  . pantoprazole  40 mg Oral Daily  . polyethylene glycol  17 g Oral Daily  .  potassium chloride  40 mEq Oral Once  . sertraline  100 mg Oral Q breakfast   Continuous Infusions:  PRN Meds:.acetaminophen **OR** acetaminophen, albuterol, benzonatate, HYDROcodone-acetaminophen, lip balm, morphine injection, ondansetron **OR** ondansetron (ZOFRAN) IV, senna-docusate, sodium chloride, traMADol Medications Prior to Admission:  Prior to Admission medications   Medication Sig Start Date End Date Taking? Authorizing Provider  acetaminophen (TYLENOL) 650 MG CR tablet Take 1,300 mg by mouth every 8 (eight) hours as needed for pain.   Yes Historical Provider, MD  albuterol (PROVENTIL HFA;VENTOLIN HFA) 108 (90 BASE) MCG/ACT inhaler Inhale 2 puffs into the lungs every 4 (four) hours as needed for wheezing or shortness of breath.   Yes Historical Provider, MD  aspirin EC 325 MG EC tablet Take 1 tablet (325 mg total) by mouth daily. 08/20/14  Yes Marton Redwood, MD  benzonatate (TESSALON) 200 MG capsule Take 1 capsule (200 mg total) by mouth 3 (three) times daily as needed for cough. 09/04/15  Yes  Charlynne Cousins, MD  Cholecalciferol (VITAMIN D) 2000 UNITS tablet Take 2,000 Units by mouth at bedtime.   Yes Historical Provider, MD  esomeprazole (NEXIUM) 20 MG capsule Take 40 mg by mouth every morning.    Yes Historical Provider, MD  fluticasone (FLONASE) 50 MCG/ACT nasal spray Place 2 sprays into both nostrils 2 (two) times daily.    Yes Historical Provider, MD  Magnesium 250 MG TABS Take 250 mg by mouth at bedtime.    Yes Historical Provider, MD  MELATONIN PO Take 1 tablet by mouth at bedtime.   Yes Historical Provider, MD  polyethylene glycol (MIRALAX / GLYCOLAX) packet Take 17 g by mouth daily as needed. 09/10/15  Yes Domenic Polite, MD  predniSONE (DELTASONE) 10 MG tablet 30mg  daily for 1 week, then 20mg  daily till FU with Dr.Wert 09/10/15  Yes Domenic Polite, MD  senna-docusate (SENOKOT-S) 8.6-50 MG tablet Take 1 tablet by mouth at bedtime as needed for mild constipation. 09/10/15  Yes Domenic Polite, MD  sertraline (ZOLOFT) 100 MG tablet Take 100 mg by mouth daily with breakfast.    Yes Historical Provider, MD  sodium chloride (OCEAN) 0.65 % SOLN nasal spray Place 1 spray into both nostrils 2 (two) times daily. Patient taking differently: Place 1 spray into both nostrils daily as needed for congestion.  08/20/14  Yes Marton Redwood, MD  furosemide (LASIX) 40 MG tablet Take 1 tablet (40 mg total) by mouth 2 (two) times daily. 10/05/15   Verlee Monte, MD  ipratropium-albuterol (DUONEB) 0.5-2.5 (3) MG/3ML SOLN Take 3 mLs by nebulization every 6 (six) hours as needed. Patient not taking: Reported on 10/02/2015 08/05/15   Robbie Lis, MD  levofloxacin (LEVAQUIN) 500 MG tablet Take 1 tablet (500 mg total) by mouth daily. For 2days Patient not taking: Reported on 10/02/2015 09/10/15   Domenic Polite, MD  potassium chloride SA (K-DUR,KLOR-CON) 20 MEQ tablet Take 1 tablet (20 mEq total) by mouth daily. 10/05/15   Verlee Monte, MD  traMADol (ULTRAM) 50 MG tablet Take 1 tablet (50 mg total) by  mouth every 6 (six) hours as needed for moderate pain. 10/05/15   Verlee Monte, MD   Allergies  Allergen Reactions  . Anesthetics, Amide Nausea And Vomiting  . Atorvastatin Other (See Comments)    Myalgia   . Codeine Nausea And Vomiting  . Darvocet [Propoxyphene N-Acetaminophen] Nausea And Vomiting  . Valsartan Other (See Comments)    Per patient " did not feel well, cannot describe it"  . Antihistamines, Loratadine-Type  Other (See Comments)    Weird dreams  . Moxifloxacin Anxiety and Other (See Comments)    Nightmares    Review of Systems  Constitutional: Positive for activity change, appetite change and fatigue.  Respiratory: Positive for shortness of breath.     Physical Exam  Constitutional: She is oriented to person, place, and time. She appears well-developed and well-nourished.  HENT:  Head: Normocephalic and atraumatic.  Cardiovascular: Normal rate, regular rhythm and normal heart sounds.   Respiratory: She is in respiratory distress.  GI: Soft. Bowel sounds are normal.  Neurological: She is alert and oriented to person, place, and time.  Skin: Skin is warm and dry.    Vital Signs: BP 156/69 mmHg  Pulse 105  Temp(Src) 97.5 F (36.4 C) (Oral)  Resp 18  Ht 5\' 1"  (1.549 m)  Wt 63.305 kg (139 lb 9 oz)  BMI 26.38 kg/m2  SpO2 89%  SpO2: SpO2: (!) 89 % (neb given ) O2 Device:SpO2: (!) 89 % (neb given ) O2 Flow Rate: .O2 Flow Rate (L/min): 5 L/min  IO: Intake/output summary:  Intake/Output Summary (Last 24 hours) at 10/05/15 1409 Last data filed at 10/05/15 0900  Gross per 24 hour  Intake    240 ml  Output   1950 ml  Net  -1710 ml    LBM: Last BM Date: 10/02/15 Baseline Weight: Weight: 69.4 kg (153 lb) Most recent weight: Weight: 63.305 kg (139 lb 9 oz)      Palliative Assessment/Data:  Flowsheet Rows        Most Recent Value   Intake Tab    Referral Department  Hospitalist   Unit at Time of Referral  Oncology Unit   Palliative Care Primary Diagnosis   Pulmonary   Date Notified  10/04/15   Palliative Care Type  Return patient Palliative Care   Reason for referral  Clarify Goals of Care   Date of Admission  10/02/15   # of days IP prior to Palliative referral  2   Clinical Assessment    Psychosocial & Spiritual Assessment    Palliative Care Outcomes       Additional Data Reviewed:  CBC:    Component Value Date/Time   WBC 11.8* 10/05/2015 0422   HGB 12.3 10/05/2015 0422   HCT 38.9 10/05/2015 0422   PLT 312 10/05/2015 0422   MCV 90.9 10/05/2015 0422   NEUTROABS 9.0* 10/02/2015 0755   LYMPHSABS 1.0 10/02/2015 0755   MONOABS 1.3* 10/02/2015 0755   EOSABS 0.0 10/02/2015 0755   BASOSABS 0.0 10/02/2015 0755   Comprehensive Metabolic Panel:    Component Value Date/Time   NA 136 10/05/2015 0422   K 3.8 10/05/2015 0422   CL 94* 10/05/2015 0422   CO2 31 10/05/2015 0422   BUN 23* 10/05/2015 0422   CREATININE 0.65 10/05/2015 0422   CREATININE 0.80 09/17/2014 1421   GLUCOSE 158* 10/05/2015 0422   CALCIUM 8.9 10/05/2015 0422   AST 23 09/06/2015 0539   ALT 29 09/06/2015 0539   ALKPHOS 64 09/06/2015 0539   BILITOT 1.0 09/06/2015 0539   PROT 5.5* 09/06/2015 0539   ALBUMIN 2.5* 09/06/2015 0539     Time In: 1300 Time Out: 1430 Time Total: 90 min Greater than 50%  of this time was spent counseling and coordinating care related to the above assessment and plan.  Signed by: Wadie Lessen, NP  Knox Royalty, NP  10/05/2015, 2:09 PM  Please contact Palliative Medicine Team phone at [phone] for questions  and concerns.

## 2015-10-05 NOTE — Progress Notes (Signed)
Pt DC to [facility] SNF. She alert and oriented times 4, VSS, she has been weaned down to 5L/[place] (high flow 02 tubing sent with the patient in her belongings bag in case she needs it at the facility. Pt VSS, skin intact, no c/o pain, no apparent destress or discomfort at this time. Family to meet at the facility.

## 2015-10-05 NOTE — Care Management Important Message (Signed)
Important Message  Patient Details  Name: MYKENZIE MCCAIG MRN: [id number] Date of Birth: [birth year]   Medicare Important Message Given:  Yes    Camillo Flaming 10/05/2015, [place] Message  Patient Details  Name: JANALEE PROFFER MRN: [id number] Date of Birth: [birth year]   Medicare Important Message Given:  Yes    Camillo Flaming 10/05/2015, 11:22 AM

## 2015-10-05 NOTE — Discharge Summary (Signed)
Physician Discharge Summary  [name] [id number] DOB: 1928/05/20 DOA: 10/02/2015  PCP: Binnie Rail, MD  Admit date: 10/02/2015 Discharge date: 10/05/2015  Time spent: 40 minutes  Recommendations for Outpatient Follow-up:  1. Palliative care to follow-up in the nursing home. 2. Follow-up with Dr. Melvyn Novas in 1 week. 3. Please check BMP in 2-3 days, patient Lasix dose increased.   Discharge Diagnoses:  Principal Problem:   Acute and chronic respiratory failure with hypoxia (HCC) Active Problems:   Postinflammatory pulmonary fibrosis (HCC)   Chronic diastolic CHF (congestive heart failure) ([place])   DNR (do not resuscitate)   Acute on chronic respiratory failure [facility])   Discharge Condition:  Stable  Diet recommendation: Heart healthy  Filed Weights   10/03/15 0500 10/04/15 0524 10/05/15 0500  Weight: 64.2 kg (141 lb 8.6 oz) 63.5 kg (139 lb 15.9 oz) 63.305 kg (139 lb 9 oz)    History of present illness:  [name] is a 81 y.o. female with past medical history of pulmonary fibrosis, chronic respiratory failure home oxygen between 2 and 3 L. Patient admitted to the hospital 3 times in the past 6 month (this is the fourth time). Patient discharged from the hospital on 09/11/15 to a nursing home from which she was discharged 3 days ago, she was on 3 L of oxygen at the nursing home, after she got home patient felt short of breath, her oxygen concentrator is set at 2.5 L, she tries to increase it and she reported the concentrator was beeping and a yellow light went off last night. So she came into the hospital complaining about shortness of breath. In the ED patient was hypoxic at 84% on 3 L, placed on nonrebreather mask, was able to go to down to 45% on Ventimask. Patient admitted to the floor for further evaluation. She is DNR/DNI, apparently interested in exploring palliative route, palliative was called by the EDP.   Hospital Course:   Acute on chronic respiratory failure  with hypoxia Patient used to be on 2.5 L of oxygen about a month ago, she was discharged on 3-4 L of oxygen. Reported that her oxygen concentrator at home might be malfunctioning, they were unable to reach the home health agency. Placed on oxygen, keep saturation around 91%. Started on IV antibiotics in the ED, I will discontinue. No fever or chills, no impressive leukocytosis. Started on high dose of steroids and Lasix. Continue current management. At the time of discharge she was on nonrebreather mask, oxygen saturation decreased to Ventimask at 45%. On discharge able to keep at 5 L of oxygen.  Acute on chronic diastolic CHF 2-D echo in September showed LVEF of 65-70% with grade 1 diastolic dysfunction. Has fluid overload and positive BNP of 483, I'll diurese with IV Lasix to keep negative fluid balance. Continue home medications. She also has moderate mitral valve stenosis. If the charting is accurate patient has -8.1 L, her weight down 14 pounds since admission. On discharge Lasix changed to twice a day.  Positive troponin Slightly positive troponin of 0.19, this is likely secondary to the hypoxia and CHF. 3 sets of cardiac enzymes cycled, flat curve not consistent with ACS. Patient denies any chest pain, this is likely secondary to acute CHF and hypoxemia.  Pulmonary fibrosis Idiopathic pulmonary fibrosis, appears to be worsening overall. Oxygen requirements increased in the past 2 month all the way to 4 L. 4 admissions to the hospital in the past 6 months, palliative consulted Recommended discharge to  SNF with palliative to follow. On prednisone taper until she follows with pulmonology.   Procedures:  None  Consultations:  Palliative  Discharge Exam: Filed Vitals:   10/05/15 0551 10/05/15 1127  BP: 157/57 156/69  Pulse: 77 105  Temp: 97.5 F (36.4 C)   Resp: 16 18   General: Alert and awake, oriented x3, not in any acute distress. HEENT: anicteric sclera, pupils  reactive to light and accommodation, EOMI CVS: S1-S2 clear, no murmur rubs or gallops Chest: clear to auscultation bilaterally, no wheezing, rales or rhonchi Abdomen: soft nontender, nondistended, normal bowel sounds, no organomegaly Extremities: no cyanosis, clubbing or edema noted bilaterally Neuro: Cranial nerves II-XII intact, no focal neurological deficits  Discharge Instructions   Discharge Instructions    Diet - low sodium heart healthy    Complete by:  As directed      Increase activity slowly    Complete by:  As directed           Current Discharge Medication List    START taking these medications   Details  potassium chloride SA (K-DUR,KLOR-CON) 20 MEQ tablet Take 1 tablet (20 mEq total) by mouth daily.      CONTINUE these medications which have CHANGED   Details  furosemide (LASIX) 40 MG tablet Take 1 tablet (40 mg total) by mouth 2 (two) times daily.    traMADol (ULTRAM) 50 MG tablet Take 1 tablet (50 mg total) by mouth every 6 (six) hours as needed for moderate pain. Qty: 20 tablet, Refills: 0      CONTINUE these medications which have NOT CHANGED   Details  acetaminophen (TYLENOL) 650 MG CR tablet Take 1,300 mg by mouth every 8 (eight) hours as needed for pain.    albuterol (PROVENTIL HFA;VENTOLIN HFA) 108 (90 BASE) MCG/ACT inhaler Inhale 2 puffs into the lungs every 4 (four) hours as needed for wheezing or shortness of breath.    aspirin EC 325 MG EC tablet Take 1 tablet (325 mg total) by mouth daily. Qty: 30 tablet, Refills: 0    benzonatate (TESSALON) 200 MG capsule Take 1 capsule (200 mg total) by mouth 3 (three) times daily as needed for cough. Qty: 20 capsule, Refills: 0    Cholecalciferol (VITAMIN D) 2000 UNITS tablet Take 2,000 Units by mouth at bedtime.    esomeprazole (NEXIUM) 20 MG capsule Take 40 mg by mouth every morning.     fluticasone (FLONASE) 50 MCG/ACT nasal spray Place 2 sprays into both nostrils 2 (two) times daily.     Magnesium  250 MG TABS Take 250 mg by mouth at bedtime.     MELATONIN PO Take 1 tablet by mouth at bedtime.    polyethylene glycol (MIRALAX / GLYCOLAX) packet Take 17 g by mouth daily as needed. Qty: 14 each, Refills: 0    predniSONE (DELTASONE) 10 MG tablet 30mg  daily for 1 week, then 20mg  daily till FU with Dr.Wert Refills: 0    senna-docusate (SENOKOT-S) 8.6-50 MG tablet Take 1 tablet by mouth at bedtime as needed for mild constipation.    sertraline (ZOLOFT) 100 MG tablet Take 100 mg by mouth daily with breakfast.     sodium chloride (OCEAN) 0.65 % SOLN nasal spray Place 1 spray into both nostrils 2 (two) times daily. Refills: 0    ipratropium-albuterol (DUONEB) 0.5-2.5 (3) MG/3ML SOLN Take 3 mLs by nebulization every 6 (six) hours as needed. Qty: 360 mL, Refills: 0      STOP taking these medications  levofloxacin (LEVAQUIN) 500 MG tablet        Allergies  Allergen Reactions  . Anesthetics, Amide Nausea And Vomiting  . Atorvastatin Other (See Comments)    Myalgia   . Codeine Nausea And Vomiting  . Darvocet [Propoxyphene N-Acetaminophen] Nausea And Vomiting  . Valsartan Other (See Comments)    Per patient " did not feel well, cannot describe it"  . Antihistamines, Loratadine-Type Other (See Comments)    Weird dreams  . Moxifloxacin Anxiety and Other (See Comments)    Nightmares      The results of significant diagnostics from this hospitalization (including imaging, microbiology, ancillary and laboratory) are listed below for reference.    Significant Diagnostic Studies: Dg Esophagus  09/07/2015  CLINICAL DATA:  81 year old female with dysphagia. Initial encounter. EXAM: ESOPHOGRAM/BARIUM SWALLOW TECHNIQUE: Single contrast examination was performed using  thin barium. FLUOROSCOPY TIME:  Radiation Exposure Index (as provided by the fluoroscopic device): 2 64.21 micro Gy COMPARISON:  08/31/2015 chest CT. FINDINGS: The examination was tailored to patient's requirement for high  flow oxygen. Poor esophageal motility consistent with presbyesophagus. No laryngeal penetration or aspiration. Small sliding-type hiatal hernia. Mild smooth narrowing just above the hiatal hernia. A 13 mm barium tablet becomes temporarily lodged at this level and only clears with subsequent swallowing. IMPRESSION: Poor esophageal motility consistent with presbyesophagus. Small sliding-type hiatal hernia. Mild smooth narrowing just above the hiatal hernia. 13 mm barium tablet becomes temporarily lodged at this level and only clears with subsequent swallowing. Electronically Signed   By: Genia Del M.D.   On: 09/07/2015 16:05   Dg Chest Port 1 View  10/02/2015  CLINICAL DATA:  Worsening shortness of breath since last night. History of pulmonary fibrosis. EXAM: PORTABLE CHEST 1 VIEW COMPARISON:  09/05/2015, 08/31/2015 and chest CT 08/31/2015 FINDINGS: Lungs are somewhat hypoinflated with persistent bilateral diffuse interstitial changes compatible with known pulmonary fibrosis. There is worsening patchy mixed interstitial airspace density bilaterally right worse than left suggesting superimposed pneumonia. No evidence of effusion. Mild cardiomegaly. Calcified plaque over the thoracic aorta. There are degenerative changes of the spine. IMPRESSION: Hypoinflation with evidence of known underlying chronic fibrosis. Worsening bilateral patchy mixed interstitial airspace density likely superimposed pneumonia. Mild cardiomegaly. Electronically Signed   By: Marin Olp M.D.   On: 10/02/2015 08:53    Microbiology: Recent Results (from the past 240 hour(s))  Culture, blood (routine x 2)     Status: None (Preliminary result)   Collection Time: 10/02/15  9:50 AM  Result Value Ref Range Status   Specimen Description BLOOD RIGHT ANTECUBITAL  Final   Special Requests BOTTLES DRAWN AEROBIC AND ANAEROBIC 5CC  Final   Culture   Final    NO GROWTH 3 DAYS Performed at [facility]    Report Status PENDING   Incomplete  Culture, blood (routine x 2)     Status: None (Preliminary result)   Collection Time: 10/02/15  9:50 AM  Result Value Ref Range Status   Specimen Description BLOOD BLOOD LEFT HAND  Final   Special Requests IN PEDIATRIC BOTTLE 3CC  Final   Culture   Final    NO GROWTH 3 DAYS Performed at [facility]    Report Status PENDING  Incomplete     Labs: Basic Metabolic Panel:  Recent Labs Lab 10/02/15 0755 10/03/15 0120 10/04/15 0416 10/05/15 0422  NA 139 136 135 136  K 4.1 3.9 3.8 3.8  CL 103 99* 95* 94*  CO2 30 28 33* 31  GLUCOSE 121* 152* 149* 158*  BUN 13 14 20  23*  CREATININE 0.64 0.68 0.68 0.65  CALCIUM 9.5 8.9 9.0 8.9   Liver Function Tests: No results for input(s): AST, ALT, ALKPHOS, BILITOT, PROT, ALBUMIN in the last 168 hours. No results for input(s): LIPASE, AMYLASE in the last 168 hours. No results for input(s): AMMONIA in the last 168 hours. CBC:  Recent Labs Lab 10/02/15 0755 10/03/15 0120 10/04/15 0416 10/05/15 0422  WBC 11.3* 10.1 15.2* 11.8*  NEUTROABS 9.0*  --   --   --   HGB 11.3* 11.2* 11.9* 12.3  HCT 35.1* 35.1* 36.1 38.9  MCV 90.7 90.5 89.1 90.9  PLT 192 229 292 312   Cardiac Enzymes:  Recent Labs Lab 10/02/15 1410 10/02/15 1941 10/03/15 0120  TROPONINI 0.59* 0.57* 0.49*   BNP: BNP (last 3 results)  Recent Labs  08/03/15 0600 08/31/15 1232 10/02/15 0755  BNP 278.6* 358.8* 483.7*    ProBNP (last 3 results) No results for input(s): PROBNP in the last 8760 hours.  CBG: No results for input(s): GLUCAP in the last 168 hours.     Signed:  Jinger Middlesworth A  [facility] 10/05/2015, 2:47 PM

## 2015-10-05 NOTE — Clinical Social Work Placement (Signed)
   CLINICAL SOCIAL WORK PLACEMENT  NOTE  Date:  10/05/2015  Patient Details  Name: [name] MRN: [id number] Date of Birth: 09-15-1928  Clinical Social Work is seeking post-discharge placement for this patient at the [place] level of care (*CSW will initial, date and re-position this form in  chart as items are completed):  Yes   Patient/family provided with [place] Work Department's list of facilities offering this level of care within the geographic area requested by the patient (or if unable, by the patient's family).  Yes   Patient/family informed of their freedom to choose among providers that offer the needed level of care, that participate in Medicare, Medicaid or managed care program needed by the patient, have an available bed and are willing to accept the patient.      Patient/family informed of [facility]'s ownership interest in [facility] and [facility], as well as of the fact that they are under no obligation to receive care at these facilities.  PASRR submitted to EDS on       PASRR number received on       Existing PASRR number confirmed on 10/05/15     FL2 transmitted to all facilities in geographic area requested by pt/family on       FL2 transmitted to all facilities within larger geographic area on       Patient informed that his/her managed care company has contracts with or will negotiate with certain facilities, including the following:        Yes   Patient/family informed of bed offers received.  Patient chooses bed at [facility] recommends and patient chooses bed at      Patient to be transferred to [facility] [place] on 10/05/15.  Patient to be transferred to facility by ambulance [facility])     Patient family notified on 10/05/15 of transfer.  Name of family member notified:  pt notiifed at bedside and pt brother, Clair Gulling notified via telephone     PHYSICIAN        Additional Comment:    _______________________________________________ Ladell Pier, LCSW 10/05/2015, 6:13 PM

## 2015-10-05 NOTE — Clinical Social Work Note (Deleted)
Clinical Social Work Assessment  Patient Details  Name: [name] MRN: [id number] Date of Birth: 1928-10-29  Date of referral:  10/05/15               Reason for consult:  Discharge Planning                Permission sought to share information with:  Family Supports, Customer service manager Permission granted to share information::  Yes, Verbal Permission Granted  Name::        Agency::     Relationship::  Brother  Contact Information:  [place]by Abraham  Housing/Transportation Living arrangements for the past 2 months:  [place] of Information:  Patient Patient Interpreter Needed:  None Criminal Activity/Legal Involvement Pertinent to Current Situation/Hospitalization:  No - Comment as needed Significant Relationships:  Siblings Lives with:  Facility Resident Do you feel safe going back to the place where you live?  Yes Need for family participation in patient care:  No (Coment)  Care giving concerns:  Pt admitted from SNF.    Social Worker assessment / plan:  Pt admitted from [facility] SNF. Per MD, pt medically ready for dc today.   BSW Intern met with pt at bedside. Pt accepting of discharging to a SNF. Pt apprehensive of going back to [facility]. Pt pleased with PT at [facility] but did not enjoy the room placement at facility. Pt would be accepting of going to [facility] if she is placed in a different room. Pt requesting a private room at a SNF. Pt explained her brother and niece have been searching for other SNFs.                                                                                                                               CSW to contact [facility] regarding different room at facility.   CSW sent pt information via [place] [facility] search.  BSW Intern called pt brother and left a voicemail to discuss SNF placement.   BSW Intern to continue to follow and provide support.   Employment status:   Retired Forensic scientist:  Medicare PT Recommendations:  [place] / Referral to community resources:  [place]  Patient/Family's Response to care:  Pt alert and oriented x4. Pt responsive and actively involved in conversation. Pt expressed frustration regarding SNF placement and the short timing. BSW Intern provided active listening and support to pt.  Patient/Family's Understanding of and Emotional Response to Diagnosis, Current Treatment, and Prognosis:  Pt understanding of dc process.  Emotional Assessment Appearance:  Appears younger than stated age Attitude/Demeanor/Rapport:  Other (Responsive, Appropriate) Affect (typically observed):  Stable, Adaptable Orientation:  Oriented to Self, Oriented to Place, Oriented to  Time, Oriented to Situation Alcohol / Substance use:  Not Applicable Psych involvement (Current and /or in the community):  No (Comment)  Discharge Needs  Concerns to  be addressed:  Discharge Planning Concerns Readmission within the last 30 days:  Yes Current discharge risk:  None Barriers to Discharge:  Continued Medical Work up   [employer], Student-SW 10/05/2015, 9:41 AM

## 2015-10-05 NOTE — NC FL2 (Signed)
[place] LEVEL OF CARE SCREENING TOOL     IDENTIFICATION  Patient Name: [name] Birthdate: 22-Jul-1928 Sex: female Admission Date (Current Location): 10/02/2015  [place] and [place] Number: [facility] and Address:  [facility],  [place] [address] [place]      Provider Number: [id number]  Attending Physician Name and Address:  No att. providers found  Relative Name and Phone Number:       Current Level of Care: Hospital Recommended Level of Care: [place] Prior Approval Number:    Date Approved/Denied:   PASRR Number: [id number]  Discharge Plan: SNF    Current Diagnoses: Patient Active Problem List   Diagnosis Date Noted  . Acute and chronic respiratory failure with hypoxia ([place]) 10/02/2015  . Acute on chronic respiratory failure ([place]) 10/02/2015  . Dysphagia   . Pyrexia   . Arterial hypotension   . Sepsis ([place]) 09/05/2015  . Palliative care encounter 09/03/2015  . DNR (do not resuscitate) 09/03/2015  . Bronchitis, chronic obstructive w acute bronchitis ([place]) 09/02/2015  . Acute bronchitis   . HCAP (healthcare-associated pneumonia) 09/01/2015  . Dyspnea 08/31/2015  . Acute respiratory failure with hypoxia ([place]) 08/31/2015  . Hypoxia   . Pain   . Depression 08/22/2015  . Chronic hyponatremia 08/03/2015  . Pulmonary fibrosis ([place]) 08/01/2015  . Chronic diastolic CHF (congestive heart failure) ([place]) 08/01/2015  . SOB (shortness of breath) 08/01/2015  . Exercise hypoxemia 07/28/2015  . Obesity 07/01/2015  . Hyperlipidemia 09/17/2014  . Coronary artery disease 08/17/2014  . Leukocytosis 08/17/2014  . Protein-calorie malnutrition ([place]) 08/17/2014  . Postinflammatory pulmonary fibrosis ([place]) 08/16/2014  . Change in bowel habits 07/24/2012  . Special screening for malignant neoplasms, colon 12/08/2011  . Stricture and stenosis of esophagus 12/08/2011  . NEOPLASM, MALIGNANT, RIGHT BREAST  11/28/2010  . OBSTRUCTIVE SLEEP APNEA 11/28/2010  . Essential hypertension 11/28/2010  . ALLERGIC RHINITIS 11/28/2010  . GERD 11/28/2010    Orientation ACTIVITIES/SOCIAL BLADDER RESPIRATION    Self, Time, Situation, Place  Active Continent O2 (As needed) (5 L/min continuous)  BEHAVIORAL SYMPTOMS/MOOD NEUROLOGICAL BOWEL NUTRITION STATUS     (NONE) Continent    PHYSICIAN VISITS COMMUNICATION OF NEEDS Height & Weight Skin      5\' 1"  (154.9 cm) 139 lbs. Normal          AMBULATORY STATUS RESPIRATION    Supervision limited (+1 assist) O2 (As needed) (5 L/min continuous)      Personal Care Assistance Level of Assistance  Bathing, Feeding, Dressing Bathing Assistance: Limited assistance Feeding assistance: Limited assistance Dressing Assistance: Limited assistance      Functional Limitations Info    Sight Info: Impaired Hearing Info: Adequate Speech Info: Adequate       SPECIAL CARE FACTORS FREQUENCY        PT Frequency: 5 x a week             Additional Factors Info  Code Status Code Status Info: DNR code status Allergies Info: Anesthetics, Amide, Atorvastatin, Codeine, Darvocet, Valsartan, Antihistamines, Loratadine-type, Moxifloxacin Psychotropic Info: Zoloft         Current Medications (10/05/2015):  This is the current hospital active medication list Current Facility-Administered Medications  Medication Dose Route Frequency Provider Last Rate Last Dose  . acetaminophen (TYLENOL) tablet 650 mg  650 mg Oral Q6H PRN Verlee Monte, MD       Or  . acetaminophen (TYLENOL) suppository 650 mg  650 mg Rectal Q6H  PRN Verlee Monte, MD      . albuterol (PROVENTIL) (2.5 MG/3ML) 0.083% nebulizer solution 2.5 mg  2.5 mg Nebulization Q4H PRN Dianne Dun, NP      . antiseptic oral rinse (CPC / CETYLPYRIDINIUM CHLORIDE 0.05%) solution 7 mL  7 mL Mouth Rinse BID Verlee Monte, MD   7 mL at 10/05/15 0945  . aspirin EC tablet 325 mg  325 mg Oral Daily Verlee Monte,  MD   325 mg at 10/05/15 0944  . benzonatate (TESSALON) capsule 200 mg  200 mg Oral TID PRN Verlee Monte, MD   200 mg at 10/04/15 2132  . enoxaparin (LOVENOX) injection 40 mg  40 mg Subcutaneous Q24H Verlee Monte, MD   40 mg at 10/04/15 2133  . fluticasone (FLONASE) 50 MCG/ACT nasal spray 2 spray  2 spray Each Nare BID Verlee Monte, MD   2 spray at 10/05/15 0946  . furosemide (LASIX) injection 40 mg  40 mg Intravenous 3 times per day Verlee Monte, MD   40 mg at 10/05/15 1500  . guaiFENesin (MUCINEX) 12 hr tablet 1,200 mg  1,200 mg Oral BID Verlee Monte, MD   1,200 mg at 10/05/15 0944  . HYDROcodone-acetaminophen (NORCO/VICODIN) 5-325 MG per tablet 1-2 tablet  1-2 tablet Oral Q4H PRN Verlee Monte, MD      . ipratropium-albuterol (DUONEB) 0.5-2.5 (3) MG/3ML nebulizer solution 3 mL  3 mL Nebulization QID Verlee Monte, MD   3 mL at 10/05/15 1605  . lip balm (CARMEX) ointment   Topical PRN Verlee Monte, MD      . magnesium gluconate (MAGONATE) tablet 250 mg  250 mg Oral QHS Verlee Monte, MD   250 mg at 10/04/15 2133  . methylPREDNISolone sodium succinate (SOLU-MEDROL) 125 mg/2 mL injection 80 mg  80 mg Intravenous 3 times per day Verlee Monte, MD   80 mg at 10/05/15 1500  . morphine 2 MG/ML injection 1 mg  1 mg Intravenous Q4H PRN Verlee Monte, MD      . ondansetron (ZOFRAN) tablet 4 mg  4 mg Oral Q6H PRN Verlee Monte, MD       Or  . ondansetron (ZOFRAN) injection 4 mg  4 mg Intravenous Q6H PRN Verlee Monte, MD      . pantoprazole (PROTONIX) EC tablet 40 mg  40 mg Oral Daily Verlee Monte, MD   40 mg at 10/02/15 1436  . polyethylene glycol (MIRALAX / GLYCOLAX) packet 17 g  17 g Oral Daily Verlee Monte, MD   17 g at 10/05/15 0945  . senna-docusate (Senokot-S) tablet 1 tablet  1 tablet Oral QHS PRN Verlee Monte, MD   1 tablet at 10/04/15 2133  . sertraline (ZOLOFT) tablet 100 mg  100 mg Oral Q breakfast Verlee Monte, MD   100 mg at 10/05/15 0944  . sodium chloride (OCEAN) 0.65 % nasal spray 1 spray  1  spray Each Nare Daily PRN Verlee Monte, MD   1 spray at 10/02/15 1601  . traMADol (ULTRAM) tablet 50 mg  50 mg Oral Q6H PRN Verlee Monte, MD       Current Outpatient Prescriptions  Medication Sig Dispense Refill  . acetaminophen (TYLENOL) 650 MG CR tablet Take 1,300 mg by mouth every 8 (eight) hours as needed for pain.    Marland Kitchen albuterol (PROVENTIL HFA;VENTOLIN HFA) 108 (90 BASE) MCG/ACT inhaler Inhale 2 puffs into the lungs every 4 (four) hours as needed for wheezing or shortness of breath.    Marland Kitchen aspirin EC 325  MG EC tablet Take 1 tablet (325 mg total) by mouth daily. 30 tablet 0  . benzonatate (TESSALON) 200 MG capsule Take 1 capsule (200 mg total) by mouth 3 (three) times daily as needed for cough. 20 capsule 0  . Cholecalciferol (VITAMIN D) 2000 UNITS tablet Take 2,000 Units by mouth at bedtime.    Marland Kitchen esomeprazole (NEXIUM) 20 MG capsule Take 40 mg by mouth every morning.     . fluticasone (FLONASE) 50 MCG/ACT nasal spray Place 2 sprays into both nostrils 2 (two) times daily.     . Magnesium 250 MG TABS Take 250 mg by mouth at bedtime.     Marland Kitchen MELATONIN PO Take 1 tablet by mouth at bedtime.    . polyethylene glycol (MIRALAX / GLYCOLAX) packet Take 17 g by mouth daily as needed. 14 each 0  . predniSONE (DELTASONE) 10 MG tablet 30mg  daily for 1 week, then 20mg  daily till FU with Dr.Wert  0  . senna-docusate (SENOKOT-S) 8.6-50 MG tablet Take 1 tablet by mouth at bedtime as needed for mild constipation.    . sertraline (ZOLOFT) 100 MG tablet Take 100 mg by mouth daily with breakfast.     . sodium chloride (OCEAN) 0.65 % SOLN nasal spray Place 1 spray into both nostrils 2 (two) times daily. (Patient taking differently: Place 1 spray into both nostrils daily as needed for congestion. )  0  . furosemide (LASIX) 40 MG tablet Take 1 tablet (40 mg total) by mouth 2 (two) times daily.    Marland Kitchen ipratropium-albuterol (DUONEB) 0.5-2.5 (3) MG/3ML SOLN Take 3 mLs by nebulization every 6 (six) hours as needed. (Patient  not taking: Reported on 10/02/2015) 360 mL 0  . potassium chloride SA (K-DUR,KLOR-CON) 20 MEQ tablet Take 1 tablet (20 mEq total) by mouth daily.    . traMADol (ULTRAM) 50 MG tablet Take 1 tablet (50 mg total) by mouth every 6 (six) hours as needed for moderate pain. 20 tablet 0     Discharge Medications: Please see discharge summary for a list of discharge medications.  Relevant Imaging Results:  Relevant Lab Results:  Recent Labs    Additional Information SSN: [id number]  KIDD, SUZANNA A, LCSW

## 2015-10-05 NOTE — Clinical Social Work Note (Signed)
Clinical Social Work Assessment  Patient Details  Name: [name] MRN: [id number] Date of Birth: [birth year]  Date of referral:  10/05/15               Reason for consult:  Discharge Planning                Permission sought to share information with:  Family Supports, Customer service manager Permission granted to share information::  Yes, Verbal Permission Granted  Name::        Agency::     Relationship::  Brother  Contact Information:  Herby Abraham  Housing/Transportation Living arrangements for the past 2 months:  [place] of Information:  Patient Patient Interpreter Needed:  None Criminal Activity/Legal Involvement Pertinent to Current Situation/Hospitalization:  No - Comment as needed Significant Relationships:  Siblings Lives with:  Facility Resident Do you feel safe going back to the place where you live?  Yes Need for family participation in patient care:  No (Coment)  Care giving concerns:  Pt admitted from home alone. Pt had a recent stay at [facility] and discharged from facility one week ago. PT recommending short term rehab upon discharge.   Social Worker assessment / plan:  Pt admitted from home alone and PT recommending SNF. Per MD, pt medically ready for dc today following palliative care meeting.   BSW Intern met with pt at bedside. Pt accepting of discharging to a SNF. Inquired with pt about recent stay at [facility]. Pt apprehensive of going back to [facility]. Pt expressed that she was pleased with PT at [facility] but did not enjoy the room placement at facility during her recent stay. Pt would be accepting of going to [facility] if she is placed in a different room. Pt requesting a private room at a SNF. Pt explained her brother and niece have been searching for other SNFs.  Pt agreeable to [facility] search.                                                                                                                                CSW completed FL2 and  sent pt information via [place] to [facility] search. CSW contacted [facility] regarding different room at facility.   CSW spoke to pt brother via telephone. Pt brother expressed frustration about pt hospital stay and does not feel that he has been adequately communicated to. Pt brother stated that MD was going to arrange Palliative Care meeting, but pt brother had not yet heard from palliative and pt brother states that he set up a meeting with [facility] to discuss goals. CSW explained that it would likely be more beneficial to meet with palliative in the hospital and informed brother that CSW would reach out to palliative to get in touch with pt brother about meeting time today. CSW discussed with pt brother that MD  feels pt medically ready for discharge following palliative meeting. Pt brother frustrated with how fast the discharge is happening. Support provided. CSW provided pt brother with the SNF bed offers available at this time.   CSW received notification from PMT NP, Wadie Lessen that Palliative meeting  Is scheduled for 1 pm. CSW to follow up with pt, pt brother following meeting to determine decision for SNF.   CSW to continue to follow to provide support and assist with pt discharge to SNF.   Employment status:  Retired Forensic scientist:  Medicare PT Recommendations:  [place] / Referral to community resources:  [place]  Patient/Family's Response to care:  Pt alert and oriented x4. Pt responsive and actively involved in conversation. Pt expressed frustration regarding adequate time for a SNF placement decision.   Patient/Family's Understanding of and Emotional Response to Diagnosis, Current Treatment, and Prognosis:  Pt understanding of recommended level of care at this time and the dc process.  Emotional Assessment Appearance:  Appears younger than stated  age Attitude/Demeanor/Rapport:  Other (Responsive, Appropriate) Affect (typically observed):  Stable, Adaptable Orientation:  Oriented to Self, Oriented to Place, Oriented to  Time, Oriented to Situation Alcohol / Substance use:  Not Applicable Psych involvement (Current and /or in the community):  No (Comment)  Discharge Needs  Concerns to be addressed:  Discharge Planning Concerns Readmission within the last 30 days:  Yes Current discharge risk:  None Barriers to Discharge:  Continued Medical Work up   Alison Murray A, LCSW 10/05/2015, 1:54 PM

## 2015-10-05 NOTE — NC FL2 (Deleted)
[place] LEVEL OF CARE SCREENING TOOL     IDENTIFICATION  Patient Name: [name] Birthdate: 08-Jul-1928 Sex: female Admission Date (Current Location): 10/02/2015  [place] and [place] Number: [facility] and Address:  [facility],  [place] [address] [place]      Provider Number: [id number]  Attending Physician Name and Address:  Verlee Monte, MD  Relative Name and Phone Number:       Current Level of Care: Hospital Recommended Level of Care: [place] Prior Approval Number:    Date Approved/Denied:   PASRR Number: [id number]  Discharge Plan: SNF    Current Diagnoses: Patient Active Problem List   Diagnosis Date Noted  . Acute and chronic respiratory failure with hypoxia ([place]) 10/02/2015  . Acute on chronic respiratory failure ([place]) 10/02/2015  . Dysphagia   . Pyrexia   . Arterial hypotension   . Sepsis ([place]) 09/05/2015  . Palliative care encounter 09/03/2015  . DNR (do not resuscitate) 09/03/2015  . Bronchitis, chronic obstructive w acute bronchitis ([place]) 09/02/2015  . Acute bronchitis   . HCAP (healthcare-associated pneumonia) 09/01/2015  . Dyspnea 08/31/2015  . Acute respiratory failure with hypoxia ([place]) 08/31/2015  . Hypoxia   . Pain   . Depression 08/22/2015  . Chronic hyponatremia 08/03/2015  . Pulmonary fibrosis ([place]) 08/01/2015  . Chronic diastolic CHF (congestive heart failure) ([place]) 08/01/2015  . SOB (shortness of breath) 08/01/2015  . Exercise hypoxemia 07/28/2015  . Obesity 07/01/2015  . Hyperlipidemia 09/17/2014  . Coronary artery disease 08/17/2014  . Leukocytosis 08/17/2014  . Protein-calorie malnutrition ([place]) 08/17/2014  . Postinflammatory pulmonary fibrosis ([place]) 08/16/2014  . Change in bowel habits 07/24/2012  . Special screening for malignant neoplasms, colon 12/08/2011  . Stricture and stenosis of esophagus 12/08/2011  . NEOPLASM, MALIGNANT, RIGHT BREAST  11/28/2010  . OBSTRUCTIVE SLEEP APNEA 11/28/2010  . Essential hypertension 11/28/2010  . ALLERGIC RHINITIS 11/28/2010  . GERD 11/28/2010    Orientation ACTIVITIES/SOCIAL BLADDER RESPIRATION           O2 (As needed) (5L/min continuous)  BEHAVIORAL SYMPTOMS/MOOD NEUROLOGICAL BOWEL NUTRITION STATUS     (NONE)      PHYSICIAN VISITS COMMUNICATION OF NEEDS Height & Weight Skin        139 lbs.            AMBULATORY STATUS RESPIRATION    Supervision limited (+1 assist) O2 (As needed) (5L/min continuous)      Personal Care Assistance Level of Assistance               Functional Limitations Info                SPECIAL CARE FACTORS FREQUENCY                      Additional Factors Info                  Current Medications (10/05/2015):  This is the current hospital active medication list Current Facility-Administered Medications  Medication Dose Route Frequency Provider Last Rate Last Dose  . acetaminophen (TYLENOL) tablet 650 mg  650 mg Oral Q6H PRN Verlee Monte, MD       Or  . acetaminophen (TYLENOL) suppository 650 mg  650 mg Rectal Q6H PRN Verlee Monte, MD      . albuterol (PROVENTIL) (2.5 MG/3ML) 0.083% nebulizer solution 2.5 mg  2.5 mg Nebulization Q4H PRN Dianne Dun, NP      .  antiseptic oral rinse (CPC / CETYLPYRIDINIUM CHLORIDE 0.05%) solution 7 mL  7 mL Mouth Rinse BID Verlee Monte, MD   7 mL at 10/05/15 0945  . aspirin EC tablet 325 mg  325 mg Oral Daily Verlee Monte, MD   325 mg at 10/05/15 0944  . benzonatate (TESSALON) capsule 200 mg  200 mg Oral TID PRN Verlee Monte, MD   200 mg at 10/04/15 2132  . enoxaparin (LOVENOX) injection 40 mg  40 mg Subcutaneous Q24H Verlee Monte, MD   40 mg at 10/04/15 2133  . fluticasone (FLONASE) 50 MCG/ACT nasal spray 2 spray  2 spray Each Nare BID Verlee Monte, MD   2 spray at 10/05/15 0946  . furosemide (LASIX) injection 40 mg  40 mg Intravenous 3 times per day Verlee Monte, MD   40 mg at 10/05/15  1500  . guaiFENesin (MUCINEX) 12 hr tablet 1,200 mg  1,200 mg Oral BID Verlee Monte, MD   1,200 mg at 10/05/15 0944  . HYDROcodone-acetaminophen (NORCO/VICODIN) 5-325 MG per tablet 1-2 tablet  1-2 tablet Oral Q4H PRN Verlee Monte, MD      . ipratropium-albuterol (DUONEB) 0.5-2.5 (3) MG/3ML nebulizer solution 3 mL  3 mL Nebulization QID Verlee Monte, MD   3 mL at 10/05/15 1605  . lip balm (CARMEX) ointment   Topical PRN Verlee Monte, MD      . magnesium gluconate (MAGONATE) tablet 250 mg  250 mg Oral QHS Verlee Monte, MD   250 mg at 10/04/15 2133  . methylPREDNISolone sodium succinate (SOLU-MEDROL) 125 mg/2 mL injection 80 mg  80 mg Intravenous 3 times per day Verlee Monte, MD   80 mg at 10/05/15 1500  . morphine 2 MG/ML injection 1 mg  1 mg Intravenous Q4H PRN Verlee Monte, MD      . ondansetron (ZOFRAN) tablet 4 mg  4 mg Oral Q6H PRN Verlee Monte, MD       Or  . ondansetron (ZOFRAN) injection 4 mg  4 mg Intravenous Q6H PRN Verlee Monte, MD      . pantoprazole (PROTONIX) EC tablet 40 mg  40 mg Oral Daily Verlee Monte, MD   40 mg at 10/02/15 1436  . polyethylene glycol (MIRALAX / GLYCOLAX) packet 17 g  17 g Oral Daily Verlee Monte, MD   17 g at 10/05/15 0945  . senna-docusate (Senokot-S) tablet 1 tablet  1 tablet Oral QHS PRN Verlee Monte, MD   1 tablet at 10/04/15 2133  . sertraline (ZOLOFT) tablet 100 mg  100 mg Oral Q breakfast Verlee Monte, MD   100 mg at 10/05/15 0944  . sodium chloride (OCEAN) 0.65 % nasal spray 1 spray  1 spray Each Nare Daily PRN Verlee Monte, MD   1 spray at 10/02/15 1601  . traMADol (ULTRAM) tablet 50 mg  50 mg Oral Q6H PRN Verlee Monte, MD         Discharge Medications: Please see discharge summary for a list of discharge medications.  Relevant Imaging Results:  Relevant Lab Results:  Recent Labs    Additional Information    KIDD, SUZANNA A, LCSW

## 2015-10-05 NOTE — Evaluation (Signed)
Occupational Therapy Evaluation Patient Details Name: [name] MRN: [id number] DOB: 1927/11/21 Today's Date: 10/05/2015    History of Present Illness 81 yo female admitted with acute respiratory failure. Hx of pulm fibrosis, CHF, HTN. Pt is from home alone. Recent d/c from SNF   Clinical Impression   Pt admitted with ARF. Pt currently with functional limitations due to the deficits listed below (see OT Problem List).  Pt will benefit from skilled OT to increase their safety and independence with ADL and functional mobility for ADL to facilitate discharge to venue listed below.      Follow Up Recommendations  SNF          Precautions / Restrictions Precautions Precautions: Fall Precaution Comments: O2 dep-monitior sats Restrictions Weight Bearing Restrictions: No      Mobility Bed Mobility Overal bed mobility: Modified Independent       Supine to sit: HOB elevated;Modified independent (Device/Increase time) Sit to supine: Modified independent (Device/Increase time);HOB elevated      Transfers Overall transfer level: Needs assistance Equipment used: Rolling walker (2 wheeled)   Sit to Stand: Min guard         General transfer comment: close guard for safety    Balance                                            ADL Overall ADL's : Needs assistance/impaired Eating/Feeding: Independent;Sitting   Grooming: Set up;Sitting   Upper Body Bathing: Set up;Sitting   Lower Body Bathing: Moderate assistance;Sit to/from stand   Upper Body Dressing : Set up;Sitting   Lower Body Dressing: Moderate assistance;Sit to/from stand   Toilet Transfer: Min guard;Comfort height toilet;Ambulation   Toileting- Clothing Manipulation and Hygiene: Min guard;Sit to/from stand                         Pertinent Vitals/Pain Pain Assessment: No/denies pain     Hand Dominance     Extremity/Trunk Assessment         Cervical / Trunk  Assessment Cervical / Trunk Assessment: Normal   Communication Communication Communication: No difficulties   Cognition Arousal/Alertness: Awake/alert Behavior During Therapy: WFL for tasks assessed/performed Overall Cognitive Status: Within Functional Limits for tasks assessed                                Home Living Family/patient expects to be discharged to:: Private residence Living Arrangements: Alone Available Help at Discharge: Family;Friend(s);Available PRN/intermittently Type of Home: House Home Access: Stairs to enter [employer] of Steps: 2 Entrance Stairs-Rails: Right Home Layout: One level     Bathroom Shower/Tub: Teacher, early years/pre: Handicapped height     Home Equipment: Environmental consultant - 2 wheels;Cane - single point;Grab bars - tub/shower;Shower seat;Toilet riser   Additional Comments: doesn't use toilet riser or shower seat, one commode is standard, one is handicapped height      Prior Functioning/Environment Level of Independence: Independent        Comments: drives, walks short distances due to SOB, denies h/o falls    OT Diagnosis: Generalized weakness   OT Problem List: Decreased strength;Decreased activity tolerance   OT Treatment/Interventions: Self-care/ADL training;DME and/or AE instruction;Patient/family education    OT Goals(Current goals can be found in the care plan section)  OT Frequency: Min 2X/week   Barriers to D/C:               End of Session    Activity Tolerance: Patient tolerated treatment well Patient left: in chair   Time: 1035-1045 OT Time Calculation (min): 10 min Charges:  OT General Charges $OT Visit: 1 Procedure OT Evaluation $Initial OT Evaluation Tier I: 1 Procedure G-Codes:    Payton Mccallum D 2015/11/02, 11:19 AM

## 2015-10-06 DIAGNOSIS — M150 Primary generalized (osteo)arthritis: Secondary | ICD-10-CM | POA: Diagnosis not present

## 2015-10-06 DIAGNOSIS — R2689 Other abnormalities of gait and mobility: Secondary | ICD-10-CM | POA: Diagnosis not present

## 2015-10-07 LAB — CULTURE, BLOOD (ROUTINE X 2)
CULTURE: NO GROWTH
Culture: NO GROWTH

## 2015-10-08 ENCOUNTER — Telehealth: Payer: Self-pay | Admitting: *Deleted

## 2015-10-08 DIAGNOSIS — F0390 Unspecified dementia without behavioral disturbance: Secondary | ICD-10-CM | POA: Diagnosis not present

## 2015-10-08 DIAGNOSIS — J811 Chronic pulmonary edema: Secondary | ICD-10-CM | POA: Diagnosis not present

## 2015-10-08 DIAGNOSIS — J9620 Acute and chronic respiratory failure, unspecified whether with hypoxia or hypercapnia: Secondary | ICD-10-CM | POA: Diagnosis not present

## 2015-10-08 DIAGNOSIS — I509 Heart failure, unspecified: Secondary | ICD-10-CM | POA: Diagnosis not present

## 2015-10-08 NOTE — Telephone Encounter (Signed)
Pt was on TCM admitted for chronic Respiratory failure. Pt will be f/u with specialist Dr. Melvyn Novas in 1 week...[name]

## 2015-10-11 DIAGNOSIS — R531 Weakness: Secondary | ICD-10-CM | POA: Insufficient documentation

## 2015-10-11 DIAGNOSIS — R2689 Other abnormalities of gait and mobility: Secondary | ICD-10-CM | POA: Diagnosis not present

## 2015-10-11 DIAGNOSIS — D558 Other anemias due to enzyme disorders: Secondary | ICD-10-CM | POA: Diagnosis not present

## 2015-10-12 DIAGNOSIS — J8410 Pulmonary fibrosis, unspecified: Secondary | ICD-10-CM | POA: Diagnosis not present

## 2015-10-12 DIAGNOSIS — I5030 Unspecified diastolic (congestive) heart failure: Secondary | ICD-10-CM | POA: Diagnosis not present

## 2015-10-12 DIAGNOSIS — I2510 Atherosclerotic heart disease of native coronary artery without angina pectoris: Secondary | ICD-10-CM | POA: Diagnosis not present

## 2015-10-12 DIAGNOSIS — Z515 Encounter for palliative care: Secondary | ICD-10-CM | POA: Diagnosis not present

## 2015-10-15 ENCOUNTER — Other Ambulatory Visit (INDEPENDENT_AMBULATORY_CARE_PROVIDER_SITE_OTHER): Payer: Medicare Other

## 2015-10-15 ENCOUNTER — Ambulatory Visit (INDEPENDENT_AMBULATORY_CARE_PROVIDER_SITE_OTHER): Payer: Medicare Other | Admitting: Internal Medicine

## 2015-10-15 ENCOUNTER — Encounter: Payer: Self-pay | Admitting: Internal Medicine

## 2015-10-15 VITALS — BP 128/60 | HR 100 | Ht 61.0 in | Wt 144.0 lb

## 2015-10-15 DIAGNOSIS — J9611 Chronic respiratory failure with hypoxia: Secondary | ICD-10-CM | POA: Diagnosis not present

## 2015-10-15 DIAGNOSIS — J8410 Pulmonary fibrosis, unspecified: Secondary | ICD-10-CM

## 2015-10-15 DIAGNOSIS — I2510 Atherosclerotic heart disease of native coronary artery without angina pectoris: Secondary | ICD-10-CM

## 2015-10-15 LAB — SEDIMENTATION RATE: SED RATE: 45 mm/h — AB (ref 0–22)

## 2015-10-15 NOTE — Progress Notes (Signed)
86 yowf never smoker referred by Dr Shaw for eval of new onset sob early 2012 with clear evidence of pulmonary fibrosis ? Related to connective tissue dz 08/2014    History of Present Illness  November 28, 2010 1st pulmonary office eval for new onset doe x across the room, abupt onset right after NY's, assoc with increase cough getting better and chest tightness assoc also with nasal congestion.. sleeping better most of the nights without noct exac or early am excess mucus production.  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 Connective tissue dz related  Chief Complaint  Patient presents with  . HFU    Pt states that her breathing has improved, but not back to baseline. No new co's today.   improved to point where could do HT on day of ov on RA Prednisone 2 with breakfast rec Nexium should be 40 mg daily automatically  Take 30-60 min before first meal of the day and pepcid ac 20 mg at  Bedtime until you return  Reduce prednisone to 20 mg daily with bfast  GERD diet   09/28/2014 f/u ov/Wert re: PF/ assoc with arthritis/Pos RA/on prednisone new rx 08/24/14   Chief Complaint  Patient presents with  . Follow-up    Pt states that her breathing continues to improve, but not yet back to normal baseline.    can do HT leaning on basket / uses HC parking / level into house and very sob if  carries groceries in vs better   a year ago=  clean house , walk around block, no HC parking  About 50% back to baseline since  on prednisone  Previously reports short courses of pred helped arthritic symptoms a lot but never on it more than a few weeks and is concerned about staying on it. rec Reduce prednisone to 10 mg each am    10/20/2014 f/u ov/Wert re: pred 10 mg daily for PF related to ? RA  Chief Complaint  Patient presents with  . Acute Visit    Pt c/o weakness, dizziness and SOB for the past 6 days. She is also c/o prod cough with thick, clear sputum.    Not really sure breathing is worse so much as her fatigue is and this correlates best with resolution of chronic leg swelling while on lasix, her doe and arthritis may are really not changed on 20 vs 10 of prednisone.  rec No change prednisone pending labs but hold the lasix until swelling gets noticeable and take it  As needed .   11/09/2014 f/u ov/Wert re: pf/ ? Assoc connective tissue dz  On 10 mg pred daily  Chief Complaint  Patient presents with  . Follow-up    Pt states that she is unchanged. She feels like she is having some increased nasal and chest congestion, has to clear her throat alot.     rec Try prednisone 10   mg one half  daily at breakfast Continue nexium 40 mg Take 30-60 min before first meal of the day and Pepcid 20 mg at supper  Please remember to go to the x-ray department downstairs for your tests - we will call you with the results when they are available. Please schedule a follow up office visit in 6 weeks, call sooner if needed - if worse increase prednisone to 10 mg daily       12/30/2014 f/u ov/Wert re: sob Chief Complaint  Patient presents with  . Follow-up    Pt states that her congestion seems to be better. Her breathing is overall doing well. No new co's today.    Decreased lasix by half, increased prednisone to 10 mg daily after back pain / hands pain increased on the lower dose but not  sob/cough at lower dose rec Ok to maintain predisone at 10 mg per day since this dose helps arthritis and therefore probably more than adequate to cover lung inflammation I am deferring whether to refer you to rheumatology to Dr Shaw's capable hands   03/31/2015 f/u ov/Wert re: connective tissue dz/ PF   Chief Complaint  Patient presents with  . Follow-up    Pt states her breathing is unchanged since the last visit. No new co's today.    can do any shopping she wants as long as has a cart and hc parking and no cough or joint aches at 10 mg daily - really  Not limited by breathing from desired activities   rec No change rx = 10 mg pred daily    07/01/2015 f/u ov/Wert re: aches/ ild that appear pred responsive Chief Complaint  Patient presents with  . Follow-up    Pt states her breathing is about the same "maybe some worse".     On 10 mg daily thru 8/21 no aches and no change in doe so reduced dose on her own to 5 mg qod. Min dry daytime coughing                  rec Add pepcid ac 20 mg at bedtime and For drainage / throat tickle try take CHLORPHENIRAMINE  4 mg - take one every 4 hours as needed - available over the counter- may cause drowsiness so start with just a bedtime dose or two and see how you tolerate it before trying in daytime   Please remember to go to the   x-ray department downstairs for your tests - we will call you with the results when they are available. The fact you are so prednisone dependent strongly suggests you have a rheumatologic condition and may benefit from consultation with a  Rheumatologist No blood work today but I would add at least an ESR to the studies you will get tomorrow with Dr Shaw to evaluate your fatigue  Leave prednisone at 10 mg per day for now with option to increase to 20 mg if condition worsens  Please see patient coordinator before you leave today  to schedule :  Wear 02 2lpm only when walking     08/12/2015  Post hosp f/u ov/Wert re:  steroid resp ild/ ? Underlying connective tissue dz/ resp failure  Chief Complaint  Patient presents with  . Hospitalization Follow-up    Post-inflammatory PF. Pt states she has been doing well on home O2/2L, wearing it 24h/day. Pt c/o of mostly dry cough, sometimes wet in the mornings. Pt also c/o of wheeze/SOB with exertion. Pt does   not use albuterol inhaler.   Presently on 40 mg of prednisone daily  Concentrator is set at 2lpm bedtime Portable system is full but unable to turn on Walked in off 02 / no sob x room to room.  rec Prednisone 30 mg daily x one week, 20 mg daily x one week, then 15 mg daily x 2 weeks then 10 mg is your new floor dose but ok to double if start to worsen. 02 instructions:  No need for 02 sitting or walking across the room but definitely wear 2lpm when walking outside your home and when get where you are going and sit down you can take it back off to save oxygen - also wear it 2lpm at bedtime automatically  [facility] is responsible for maintaining your equipment and explaining how it works but if there is any issue with them call [facility] at [phone] with the name of the person that's not helping you and we'll call them directly    10/15/2015  f/u ov/Wert re:  PF/ steroid resp component  Now at 20 mg per day  Chief Complaint  Patient presents with  . Follow-up    pt following for Postinflammatory pulmonary fibrosis:  pt states she is ding well, pt was in the hospital in hospital at the end of  November and feels she is progressing very well. no c/o of sob, wheezing, cough or chest tightness. pt is doing rehab at [facility] at [place]. pt using continuous O2 24/7 4LPM. DME: [facility]     No obvious day to day or daytime variability or assoc   cp or chest tightness,  or overt sinus or hb symptoms. No unusual exp hx or h/o childhood pna/ asthma or knowledge of premature birth.  Sleeping ok without nocturnal  or early am exacerbation  of respiratory  c/o's or need  for noct saba. Also denies any obvious fluctuation of symptoms with weather or environmental changes or other aggravating or alleviating factors except as outlined above   Current Medications, Allergies, Complete Past Medical History, Past Surgical History, Family History, and Social History were reviewed in [employer] record.  ROS  The following are not active complaints unless bolded sore throat, dysphagia, dental problems, itching, sneezing,  nasal congestion or excess/ purulent secretions, ear ache,   fever, chills, sweats, unintended wt loss, classically pleuritic or exertional cp, hemoptysis,  orthopnea pnd or leg swelling, presyncope, palpitations, abdominal pain, anorexia, nausea, vomiting, diarrhea  or change in bowel or bladder habits, change in stools or urine, dysuria,hematuria,  rash, arthralgias, visual complaints, headache, numbness, weakness or ataxia or problems with walking or coordination,  change in mood/affect or memory.        Pex  amb wf nad  09/28/2014    160  Vs 10/20/2014  160 > 11/09/2014 162 > 12/30/2014  160  > 03/31/2015 161 > 07/01/2015  162 > 10/15/2015   144     08/31/14 158 lb (71.668 kg)  08/15/14 159 lb 2.8 oz (72.2 kg)  08/07/14 162 lb (73.483 kg)     HEENT: nl dentition, turbinates, and orophanx. Nl external ear canals without cough reflex   NECK :  without JVD/Nodes/TM/ nl carotid upstrokes bilaterally   LUNGS: no acc muscle use,  Bilateral faint  basilar  insp crackles s cough on insp   CV:  RRR  no s3 or murmur or increase in P2, no edema   ABD:  soft and nontender with nl excursion in  the supine position. No bruits or organomegaly, bowel sounds nl  MS:  warm without deformities, calf tenderness, cyanosis or clubbing  SKIN: warm and dry without lesions    NEURO:  alert, approp, no deficits       I personally reviewed images and agree with radiology impression as follows:  CXR:  10/02/15 Hypoinflation with evidence  of known underlying chronic fibrosis. Worsening bilateral patchy mixed interstitial airspace density likely superimposed pneumonia. Mild cardiomegaly.   Labs ordered  10/15/2015     Lab Results  Component Value Date   ESRSEDRATE 45* 10/15/2015   ESRSEDRATE 65* 09/05/2015   ESRSEDRATE 63* 08/31/2015

## 2015-10-15 NOTE — Patient Instructions (Addendum)
Please remember to go to the lab  department downstairs for your tests - we will call you with the results when they are available.  Leave prednisone 20 mg daily until next ov   Goal for 02 is to keep the saturations above 90%   Please schedule a follow up office visit in 6 weeks, call sooner if needed

## 2015-10-17 NOTE — Assessment & Plan Note (Addendum)
PFT's 03/10/10 FEV1 1.68 (130%) ratio 74 , DLCO 48% (vs 45% 04/22/07) corrects to 60%  See inpt adm 08/15/14  -CT 08/15/14 >> moderate CAD, UIP pattern of pulmonary fibrosis with b/l areas of GGO   -ESR 08/16/14 >> 67 > started systemic steroids as inpt -Echo 08/16/14 >> mild LVH, EF 60 to 44%, diastolic dysfx  -Labs 92/01/00 >> ANA negative, RF 32, anti-CCP negative, SS-A/SS-B negative, SCL-70 negative  -PFT 08/17/14 >> FEV1 1.75 (115%), FEV1% 84, TLC 3.39 (73%), DLCO 45% - 08/31/14   Walked RA x 3 laps @ 185 ft each stopped due to  92% slow pace @ 40 mg per day> rec reduce to 20 mg daily  - 09/28/2014  Walked RA x 3 laps @ 185 ft each stopped due to  Slow to mod pace, some sob at end but sats 94%  - reduced pred to 10 mg daily 09/28/2014 > worse starting 10/14/14 so recheck esr 10/20/2014  - 10/20/2014  Walked RA x 3 laps @ 185 ft each stopped due to end of study, slow to mod pace, no sob or desats on 10 mg daily pred - 11/09/2014   Trial of prednisone 5 mg daily > flared in terms of arthritis > resumed 10 mg per day since around 12/07/14  - 03/31/2015  Walked RA x 3 laps @ 185 ft each stopped due to  End of study, some sob but no desat at nl pace at 10 mg pred per day  - 07/01/2015  Walked RA x 3 laps @ 185 ft each stopped due to min sob/ nl pace no desat    At a dose of 10 mg daily until 06/27/15 then changed herself to 5 mg qod> rec slow taper back to same floor - 07/28/2015   Walked RA  2 laps @ 185 ft each stopped due to  Fatigue/ nl pace, no desat @ 10 mg daily with ESR 35  - 10/15/2015 reports walking 300 ft at [facility] and stops due to sob with sats 88% on 4lpm with esr 45 on 20 mg pred per day   The goal with a chronic steroid dependent illness is always arriving at the lowest effective dose that controls the disease/symptoms and not accepting a set "formula" which is based on statistics or guidelines that don't always take into account patient  variability or the natural hx of the dz in every individual  patient, which may well vary over time.  For now therefore I recommend the patient maintain  A floor of 20 mg per day of prednisone which historically has controlled both the arthritis and the ILD progression and when returns recheck walking sats on 02 and esr's serially to see if tolerates any reduction in dosing   I had an extended discussion with the patient reviewing all relevant studies completed to date and  lasting 15 to 20 minutes of a 25 minute visit    Each maintenance medication was reviewed in detail including most importantly the difference between maintenance and prns and under what circumstances the prns are to be triggered using an action plan format that is not reflected in the computer generated alphabetically organized AVS.    Please see instructions for details which were reviewed in writing and the patient given a copy highlighting the part that I personally wrote and discussed at today's ov.

## 2015-10-18 DIAGNOSIS — I5030 Unspecified diastolic (congestive) heart failure: Secondary | ICD-10-CM | POA: Diagnosis not present

## 2015-10-18 DIAGNOSIS — J8410 Pulmonary fibrosis, unspecified: Secondary | ICD-10-CM | POA: Diagnosis not present

## 2015-10-18 DIAGNOSIS — Z515 Encounter for palliative care: Secondary | ICD-10-CM | POA: Diagnosis not present

## 2015-10-18 DIAGNOSIS — I2510 Atherosclerotic heart disease of native coronary artery without angina pectoris: Secondary | ICD-10-CM | POA: Diagnosis not present

## 2015-10-19 NOTE — Progress Notes (Signed)
Quick Note:  LMTCB ______ 

## 2015-10-20 DIAGNOSIS — I509 Heart failure, unspecified: Secondary | ICD-10-CM | POA: Diagnosis not present

## 2015-10-20 DIAGNOSIS — J9620 Acute and chronic respiratory failure, unspecified whether with hypoxia or hypercapnia: Secondary | ICD-10-CM | POA: Diagnosis not present

## 2015-10-20 DIAGNOSIS — J8410 Pulmonary fibrosis, unspecified: Secondary | ICD-10-CM | POA: Diagnosis not present

## 2015-10-20 NOTE — Progress Notes (Signed)
Quick Note:  LMTCB ______ 

## 2015-10-21 ENCOUNTER — Encounter: Payer: Self-pay | Admitting: *Deleted

## 2015-10-23 DIAGNOSIS — I2510 Atherosclerotic heart disease of native coronary artery without angina pectoris: Secondary | ICD-10-CM | POA: Diagnosis not present

## 2015-10-23 DIAGNOSIS — I110 Hypertensive heart disease with heart failure: Secondary | ICD-10-CM | POA: Diagnosis not present

## 2015-10-23 DIAGNOSIS — E46 Unspecified protein-calorie malnutrition: Secondary | ICD-10-CM | POA: Diagnosis not present

## 2015-10-23 DIAGNOSIS — G4733 Obstructive sleep apnea (adult) (pediatric): Secondary | ICD-10-CM | POA: Diagnosis not present

## 2015-10-23 DIAGNOSIS — Z9981 Dependence on supplemental oxygen: Secondary | ICD-10-CM | POA: Diagnosis not present

## 2015-10-23 DIAGNOSIS — Z7982 Long term (current) use of aspirin: Secondary | ICD-10-CM | POA: Diagnosis not present

## 2015-10-23 DIAGNOSIS — J8410 Pulmonary fibrosis, unspecified: Secondary | ICD-10-CM | POA: Diagnosis not present

## 2015-10-23 DIAGNOSIS — J449 Chronic obstructive pulmonary disease, unspecified: Secondary | ICD-10-CM | POA: Diagnosis not present

## 2015-10-23 DIAGNOSIS — Z87891 Personal history of nicotine dependence: Secondary | ICD-10-CM | POA: Diagnosis not present

## 2015-10-23 DIAGNOSIS — I5033 Acute on chronic diastolic (congestive) heart failure: Secondary | ICD-10-CM | POA: Diagnosis not present

## 2015-10-23 DIAGNOSIS — J9621 Acute and chronic respiratory failure with hypoxia: Secondary | ICD-10-CM | POA: Diagnosis not present

## 2015-10-23 DIAGNOSIS — F329 Major depressive disorder, single episode, unspecified: Secondary | ICD-10-CM | POA: Diagnosis not present

## 2015-10-23 DIAGNOSIS — E785 Hyperlipidemia, unspecified: Secondary | ICD-10-CM | POA: Diagnosis not present

## 2015-10-25 DIAGNOSIS — J9621 Acute and chronic respiratory failure with hypoxia: Secondary | ICD-10-CM | POA: Diagnosis not present

## 2015-10-25 DIAGNOSIS — I5033 Acute on chronic diastolic (congestive) heart failure: Secondary | ICD-10-CM | POA: Diagnosis not present

## 2015-10-25 DIAGNOSIS — J8410 Pulmonary fibrosis, unspecified: Secondary | ICD-10-CM | POA: Diagnosis not present

## 2015-10-25 DIAGNOSIS — J449 Chronic obstructive pulmonary disease, unspecified: Secondary | ICD-10-CM | POA: Diagnosis not present

## 2015-10-25 DIAGNOSIS — I110 Hypertensive heart disease with heart failure: Secondary | ICD-10-CM | POA: Diagnosis not present

## 2015-10-25 DIAGNOSIS — G4733 Obstructive sleep apnea (adult) (pediatric): Secondary | ICD-10-CM | POA: Diagnosis not present

## 2015-10-26 DIAGNOSIS — J8410 Pulmonary fibrosis, unspecified: Secondary | ICD-10-CM | POA: Diagnosis not present

## 2015-10-26 DIAGNOSIS — I110 Hypertensive heart disease with heart failure: Secondary | ICD-10-CM | POA: Diagnosis not present

## 2015-10-26 DIAGNOSIS — G4733 Obstructive sleep apnea (adult) (pediatric): Secondary | ICD-10-CM | POA: Diagnosis not present

## 2015-10-26 DIAGNOSIS — I5033 Acute on chronic diastolic (congestive) heart failure: Secondary | ICD-10-CM | POA: Diagnosis not present

## 2015-10-26 DIAGNOSIS — J449 Chronic obstructive pulmonary disease, unspecified: Secondary | ICD-10-CM | POA: Diagnosis not present

## 2015-10-26 DIAGNOSIS — J9621 Acute and chronic respiratory failure with hypoxia: Secondary | ICD-10-CM | POA: Diagnosis not present

## 2015-10-27 ENCOUNTER — Ambulatory Visit: Payer: Medicare Other | Admitting: Internal Medicine

## 2015-10-29 ENCOUNTER — Telehealth: Payer: Self-pay | Admitting: Internal Medicine

## 2015-10-29 DIAGNOSIS — J449 Chronic obstructive pulmonary disease, unspecified: Secondary | ICD-10-CM | POA: Diagnosis not present

## 2015-10-29 DIAGNOSIS — I5033 Acute on chronic diastolic (congestive) heart failure: Secondary | ICD-10-CM | POA: Diagnosis not present

## 2015-10-29 DIAGNOSIS — G4733 Obstructive sleep apnea (adult) (pediatric): Secondary | ICD-10-CM | POA: Diagnosis not present

## 2015-10-29 DIAGNOSIS — J8410 Pulmonary fibrosis, unspecified: Secondary | ICD-10-CM | POA: Diagnosis not present

## 2015-10-29 DIAGNOSIS — I110 Hypertensive heart disease with heart failure: Secondary | ICD-10-CM | POA: Diagnosis not present

## 2015-10-29 DIAGNOSIS — J9621 Acute and chronic respiratory failure with hypoxia: Secondary | ICD-10-CM | POA: Diagnosis not present

## 2015-10-29 NOTE — Telephone Encounter (Signed)
Patient notified of lab results. Nothing further needed.  

## 2015-11-02 DIAGNOSIS — G4733 Obstructive sleep apnea (adult) (pediatric): Secondary | ICD-10-CM | POA: Diagnosis not present

## 2015-11-02 DIAGNOSIS — J9621 Acute and chronic respiratory failure with hypoxia: Secondary | ICD-10-CM | POA: Diagnosis not present

## 2015-11-02 DIAGNOSIS — I110 Hypertensive heart disease with heart failure: Secondary | ICD-10-CM | POA: Diagnosis not present

## 2015-11-02 DIAGNOSIS — I5033 Acute on chronic diastolic (congestive) heart failure: Secondary | ICD-10-CM | POA: Diagnosis not present

## 2015-11-02 DIAGNOSIS — J8410 Pulmonary fibrosis, unspecified: Secondary | ICD-10-CM | POA: Diagnosis not present

## 2015-11-02 DIAGNOSIS — J449 Chronic obstructive pulmonary disease, unspecified: Secondary | ICD-10-CM | POA: Diagnosis not present

## 2015-11-04 DIAGNOSIS — I5033 Acute on chronic diastolic (congestive) heart failure: Secondary | ICD-10-CM | POA: Diagnosis not present

## 2015-11-04 DIAGNOSIS — G4733 Obstructive sleep apnea (adult) (pediatric): Secondary | ICD-10-CM | POA: Diagnosis not present

## 2015-11-04 DIAGNOSIS — J449 Chronic obstructive pulmonary disease, unspecified: Secondary | ICD-10-CM | POA: Diagnosis not present

## 2015-11-04 DIAGNOSIS — J9621 Acute and chronic respiratory failure with hypoxia: Secondary | ICD-10-CM | POA: Diagnosis not present

## 2015-11-04 DIAGNOSIS — I110 Hypertensive heart disease with heart failure: Secondary | ICD-10-CM | POA: Diagnosis not present

## 2015-11-04 DIAGNOSIS — J8410 Pulmonary fibrosis, unspecified: Secondary | ICD-10-CM | POA: Diagnosis not present

## 2015-11-05 DIAGNOSIS — J8410 Pulmonary fibrosis, unspecified: Secondary | ICD-10-CM | POA: Diagnosis not present

## 2015-11-05 DIAGNOSIS — I5033 Acute on chronic diastolic (congestive) heart failure: Secondary | ICD-10-CM | POA: Diagnosis not present

## 2015-11-05 DIAGNOSIS — I110 Hypertensive heart disease with heart failure: Secondary | ICD-10-CM | POA: Diagnosis not present

## 2015-11-05 DIAGNOSIS — J9621 Acute and chronic respiratory failure with hypoxia: Secondary | ICD-10-CM | POA: Diagnosis not present

## 2015-11-05 DIAGNOSIS — G4733 Obstructive sleep apnea (adult) (pediatric): Secondary | ICD-10-CM | POA: Diagnosis not present

## 2015-11-05 DIAGNOSIS — J449 Chronic obstructive pulmonary disease, unspecified: Secondary | ICD-10-CM | POA: Diagnosis not present

## 2015-11-09 DIAGNOSIS — J449 Chronic obstructive pulmonary disease, unspecified: Secondary | ICD-10-CM | POA: Diagnosis not present

## 2015-11-09 DIAGNOSIS — J9621 Acute and chronic respiratory failure with hypoxia: Secondary | ICD-10-CM | POA: Diagnosis not present

## 2015-11-09 DIAGNOSIS — I110 Hypertensive heart disease with heart failure: Secondary | ICD-10-CM | POA: Diagnosis not present

## 2015-11-09 DIAGNOSIS — I5033 Acute on chronic diastolic (congestive) heart failure: Secondary | ICD-10-CM | POA: Diagnosis not present

## 2015-11-09 DIAGNOSIS — J8410 Pulmonary fibrosis, unspecified: Secondary | ICD-10-CM | POA: Diagnosis not present

## 2015-11-09 DIAGNOSIS — G4733 Obstructive sleep apnea (adult) (pediatric): Secondary | ICD-10-CM | POA: Diagnosis not present

## 2015-11-11 DIAGNOSIS — G4733 Obstructive sleep apnea (adult) (pediatric): Secondary | ICD-10-CM | POA: Diagnosis not present

## 2015-11-11 DIAGNOSIS — J449 Chronic obstructive pulmonary disease, unspecified: Secondary | ICD-10-CM | POA: Diagnosis not present

## 2015-11-11 DIAGNOSIS — I5033 Acute on chronic diastolic (congestive) heart failure: Secondary | ICD-10-CM | POA: Diagnosis not present

## 2015-11-11 DIAGNOSIS — J8410 Pulmonary fibrosis, unspecified: Secondary | ICD-10-CM | POA: Diagnosis not present

## 2015-11-11 DIAGNOSIS — J9621 Acute and chronic respiratory failure with hypoxia: Secondary | ICD-10-CM | POA: Diagnosis not present

## 2015-11-11 DIAGNOSIS — I110 Hypertensive heart disease with heart failure: Secondary | ICD-10-CM | POA: Diagnosis not present

## 2015-11-19 ENCOUNTER — Other Ambulatory Visit (INDEPENDENT_AMBULATORY_CARE_PROVIDER_SITE_OTHER): Payer: Medicare Other

## 2015-11-19 ENCOUNTER — Ambulatory Visit (INDEPENDENT_AMBULATORY_CARE_PROVIDER_SITE_OTHER): Payer: Medicare Other | Admitting: Internal Medicine

## 2015-11-19 ENCOUNTER — Encounter: Payer: Self-pay | Admitting: Internal Medicine

## 2015-11-19 VITALS — BP 134/68 | HR 92 | Temp 98.2°F | Resp 18 | Wt 147.0 lb

## 2015-11-19 DIAGNOSIS — I10 Essential (primary) hypertension: Secondary | ICD-10-CM

## 2015-11-19 DIAGNOSIS — I5032 Chronic diastolic (congestive) heart failure: Secondary | ICD-10-CM

## 2015-11-19 DIAGNOSIS — J8410 Pulmonary fibrosis, unspecified: Secondary | ICD-10-CM | POA: Diagnosis not present

## 2015-11-19 LAB — COMPREHENSIVE METABOLIC PANEL
ALT: 20 U/L (ref 0–35)
AST: 22 U/L (ref 0–37)
Albumin: 4.1 g/dL (ref 3.5–5.2)
Alkaline Phosphatase: 48 U/L (ref 39–117)
BUN: 21 mg/dL (ref 6–23)
CO2: 32 meq/L (ref 19–32)
Calcium: 9.9 mg/dL (ref 8.4–10.5)
Chloride: 97 mEq/L (ref 96–112)
Creatinine, Ser: 0.93 mg/dL (ref 0.40–1.20)
GFR: 60.86 mL/min (ref >60.00)
GLUCOSE: 129 mg/dL — AB (ref 70–99)
POTASSIUM: 4.7 meq/L (ref 3.5–5.1)
SODIUM: 138 meq/L (ref 135–145)
TOTAL PROTEIN: 7.5 g/dL (ref 6.0–8.3)
Total Bilirubin: 0.6 mg/dL (ref 0.2–1.2)

## 2015-11-19 MED ORDER — FUROSEMIDE 40 MG PO TABS: ORAL_TABLET | ORAL | Status: DC

## 2015-11-19 NOTE — Assessment & Plan Note (Signed)
euvolemic today Weights have been stable at home Decrease lasix to 40 mg q am and 20 mg q afternoon Continue low sodium diet Continue to monitor weight daily at home

## 2015-11-19 NOTE — Progress Notes (Signed)
Subjective:    Patient ID: [name], female    DOB: 1928-08-31, 82 y.o.   MRN: [id number]  HPI She is here for follow up from several hospital stays and SNF.  She is currently at home and was discharged with PT, OT, nurse, and HHA.  She was discharged on 5 L [place] of oxygen continuously.  Prior to her last admisstion she was using 2.5 L of oxygen.  She was instructed to wean if possible and is currently on 3L.  Since arriving here she has been on 2 L and her oxygen sat has been 98%   In the past 6 or 7 months she has been in the hospital 4 times and was discharged from SNF almost one month ago.  She was admitted to the hospital each time for actue on chronic respiratory failure with hypoxia due to pulmonary fibrosis.  The last admission was related to her oxygen concentrator malfunctioning.  She also has CHF.    She is on daily prednisone, Lasix, oxygen.  She has not needed the neb treatments recently and is not using them.    She is doing pulmonary rehab exercises and is active in the house.  She feels better than she has in over one year.  She does her own cooking.    She monitor she weight daily and it has been very stable.  She is on a higher lasix dose than when she went into the hospital and she wonders if it can be decreased. She has not been able to follow up with cardiology yet.   Medications and allergies reviewed with patient and updated if appropriate.  Patient Active Problem List   Diagnosis Date Noted  . Weakness generalized   . Dysphagia   . Pyrexia   . Sepsis ([place]) 09/05/2015  . Palliative care encounter 09/03/2015  . DNR (do not resuscitate) 09/03/2015  . Bronchitis, chronic obstructive w acute bronchitis ([place]) 09/02/2015  . HCAP (healthcare-associated pneumonia) 09/01/2015  . Dyspnea 08/31/2015  . Pain   . Depression 08/22/2015  . Chronic hyponatremia 08/03/2015  . Chronic diastolic CHF (congestive heart failure) ([place]) 08/01/2015  . SOB (shortness of breath)  08/01/2015  . Chronic respiratory failure with hypoxia ([place]) 07/28/2015  . Obesity 07/01/2015  . Hyperlipidemia 09/17/2014  . Coronary artery disease 08/17/2014  . Leukocytosis 08/17/2014  . Protein-calorie malnutrition ([place]) 08/17/2014  . Postinflammatory pulmonary fibrosis ([place]) 08/16/2014  . Change in bowel habits 07/24/2012  . Special screening for malignant neoplasms, colon 12/08/2011  . Stricture and stenosis of esophagus 12/08/2011  . NEOPLASM, MALIGNANT, RIGHT BREAST 11/28/2010  . OBSTRUCTIVE SLEEP APNEA 11/28/2010  . Essential hypertension 11/28/2010  . ALLERGIC RHINITIS 11/28/2010  . GERD 11/28/2010    Current Outpatient Prescriptions on File Prior to Visit  Medication Sig Dispense Refill  . acetaminophen (TYLENOL) 650 MG CR tablet Take 1,300 mg by mouth every 8 (eight) hours as needed for pain.    Marland Kitchen albuterol (PROVENTIL HFA;VENTOLIN HFA) 108 (90 BASE) MCG/ACT inhaler Inhale 2 puffs into the lungs every 4 (four) hours as needed for wheezing or shortness of breath.    Marland Kitchen aspirin EC 325 MG EC tablet Take 1 tablet (325 mg total) by mouth daily. 30 tablet 0  . benzonatate (TESSALON) 200 MG capsule Take 1 capsule (200 mg total) by mouth 3 (three) times daily as needed for cough. 20 capsule 0  . Cholecalciferol (VITAMIN D) 2000 UNITS tablet Take 2,000 Units by mouth at bedtime.    Marland Kitchen  esomeprazole (NEXIUM) 20 MG capsule Take 40 mg by mouth every morning.     . fluticasone (FLONASE) 50 MCG/ACT nasal spray Place 2 sprays into both nostrils 2 (two) times daily.     . furosemide (LASIX) 40 MG tablet Take 1 tablet (40 mg total) by mouth 2 (two) times daily.    Marland Kitchen ipratropium-albuterol (DUONEB) 0.5-2.5 (3) MG/3ML SOLN Take 3 mLs by nebulization every 6 (six) hours as needed. 360 mL 0  . Magnesium 250 MG TABS Take 250 mg by mouth at bedtime.     Marland Kitchen MELATONIN PO Take 1 tablet by mouth at bedtime.    . polyethylene glycol (MIRALAX / GLYCOLAX) packet Take 17 g by mouth daily as needed. 14 each 0    . potassium chloride SA (K-DUR,KLOR-CON) 20 MEQ tablet Take 1 tablet (20 mEq total) by mouth daily.    . predniSONE (DELTASONE) 10 MG tablet 30mg  daily for 1 week, then 20mg  daily till FU with Dr.Wert  0  . senna-docusate (SENOKOT-S) 8.6-50 MG tablet Take 1 tablet by mouth at bedtime as needed for mild constipation.    . sertraline (ZOLOFT) 100 MG tablet Take 100 mg by mouth daily with breakfast.     . sodium chloride (OCEAN) 0.65 % SOLN nasal spray Place 1 spray into both nostrils 2 (two) times daily. (Patient taking differently: Place 1 spray into both nostrils daily as needed for congestion. )  0  . traMADol (ULTRAM) 50 MG tablet Take 1 tablet (50 mg total) by mouth every 6 (six) hours as needed for moderate pain. 20 tablet 0   No current facility-administered medications on file prior to visit.    Past Medical History  Diagnosis Date  . Dyspnea   . Diverticulosis 2008  . Internal hemorrhoids   . Esophageal stricture   . Arthritis   . Breast cancer ([place]) 1969  . GERD (gastroesophageal reflux disease)   . Gallstones   . Depression   . HLD (hyperlipidemia)   . HTN (hypertension)   . Obesity   . Colon polyp 2013    TUBULAR ADENOMA (X1  . Pulmonary fibrosis ([place])   . CAD (coronary artery disease)   . Chronic diastolic CHF (congestive heart failure) ([place]) 08/01/2015    Past Surgical History  Procedure Laterality Date  . Abdominal hysterectomy    . Cholecystectomy    . Mastectomy      right  . Appendectomy    . Foot surgery    . Breast surgery      Social History   Social History  . Marital Status: Married    Spouse Name: N/A  . Number of Children: 0  . Years of Education: N/A   Occupational History  . retired    Social History Main Topics  . Smoking status: Never Smoker   . Smokeless tobacco: Never Used  . Alcohol Use: 4.2 oz/week    7 Glasses of wine per week     Comment: ocaasional  . Drug Use: No  . Sexual Activity: Not Asked   Other Topics Concern  .  None   Social History Narrative    Family History  Problem Relation Age of Onset  . Heart disease Mother   . Cancer Mother     bladder  . Esophageal cancer Brother   . Prostate cancer Brother   . Heart disease Brother   . Cancer Brother     sarcoma  . Stomach cancer Neg Hx     Review  of Systems  Constitutional: Negative for fever and chills.  HENT: Positive for congestion.   Respiratory: Positive for shortness of breath (chronic with exertion). Negative for cough and wheezing.   Cardiovascular: Positive for palpitations. Negative for chest pain and leg swelling.  Gastrointestinal: Negative for nausea and abdominal pain.  Neurological: Negative for dizziness, light-headedness and headaches.       Objective:   Filed Vitals:   11/19/15 1324  BP: 134/68  Pulse: 92  Temp: 98.2 F (36.8 C)  Resp: 18   Filed Weights   11/19/15 1324  Weight: 147 lb (66.679 kg)   Body mass index is 27.79 kg/(m^2).   Physical Exam  Constitutional: She is oriented to person, place, and time. She appears well-developed and well-nourished.  Wearing [place] and in NAD  HENT:  Head: Normocephalic and atraumatic.  Right Ear: External ear normal.  Left Ear: External ear normal.  Mouth/Throat: Oropharynx is clear and moist.  Ear canals and TM normal  Neck: Neck supple. No JVD present. No tracheal deviation present. No thyromegaly present.  Cardiovascular: Normal rate and regular rhythm.   No murmur heard. Pulmonary/Chest: Effort normal and breath sounds normal. No respiratory distress. She has no rales.  Musculoskeletal: She exhibits no edema.  Lymphadenopathy:    She has no cervical adenopathy.  Neurological: She is alert and oriented to person, place, and time.  Psychiatric: She has a normal mood and affect. Her behavior is normal.       Assessment & Plan:   Pulmon fibrosis, actue on chornic resp failure Overall doing much better - nervous about prednisone dose being lowered next  week Oxygen 2-3L prednisone following with pulm - has appt next week  See Problem List for Assessment and Plan of chronic medical problems.   Follow up in April, sooner if needed

## 2015-11-19 NOTE — Patient Instructions (Addendum)
I ordered blood work to recheck your [facility] function - please have it done next week when you come to see Dr. Melvyn Novas.  Medications reviewed and updated.  Changes include decreasing the lasix to 40 mg in the morning and 20 mg in the afternoon.   Monitor your weight very carefully.

## 2015-11-19 NOTE — Assessment & Plan Note (Signed)
BP Readings from Last 3 Encounters:  11/19/15 134/68  10/15/15 128/60  10/05/15 156/69   bp controlled today Continue current medications

## 2015-11-19 NOTE — Progress Notes (Signed)
Pre visit review using our clinic review tool, if applicable. No additional management support is needed unless otherwise documented below in the visit note. 

## 2015-11-26 ENCOUNTER — Encounter: Payer: Self-pay | Admitting: Internal Medicine

## 2015-11-26 ENCOUNTER — Ambulatory Visit (INDEPENDENT_AMBULATORY_CARE_PROVIDER_SITE_OTHER): Payer: Medicare Other | Admitting: Internal Medicine

## 2015-11-26 VITALS — BP 126/62 | HR 90 | Ht 61.0 in | Wt 149.0 lb

## 2015-11-26 DIAGNOSIS — J9611 Chronic respiratory failure with hypoxia: Secondary | ICD-10-CM | POA: Diagnosis not present

## 2015-11-26 DIAGNOSIS — J8410 Pulmonary fibrosis, unspecified: Secondary | ICD-10-CM | POA: Diagnosis not present

## 2015-11-26 NOTE — Patient Instructions (Signed)
A Rheumatologist might be able to come up with an alternative to prednisone but for now leave at 20 mg daily   Goal for 02 is to keep over 90%  Please see patient coordinator before you leave today  to schedule BEST FIT evaluation for portable 02   Please schedule a follow up visit in 3 months but call sooner if needed

## 2015-11-26 NOTE — Progress Notes (Signed)
46 yowf never smoker referred by Dr Brigitte Pulse for eval of new onset sob early 2012 with clear evidence of pulmonary fibrosis ? Related to connective tissue dz 08/2014    History of Present Illness  November 28, 2010 1st pulmonary office eval for new onset doe x across the room, abupt onset right after NY's, assoc with increase cough getting better and chest tightness assoc also with nasal congestion.. sleeping better most of the nights without noct exac or early am excess mucus production.  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 Connective tissue dz related  Chief Complaint  Patient presents with  . HFU    Pt states that her breathing has improved, but not back to baseline. No new co's today.   improved to point where could do HT on day of ov on RA Prednisone 2 with breakfast rec Nexium should be 40 mg daily automatically  Take 30-60 min before first meal of the day and pepcid ac 20 mg at  Bedtime until you return  Reduce prednisone to 20 mg daily with bfast  GERD diet   09/28/2014 f/u ov/Ligia Duguay re: PF/ assoc with arthritis/Pos RA/on prednisone new rx 08/24/14   Chief Complaint  Patient presents with  . Follow-up    Pt states that her breathing continues to improve, but not yet back to normal baseline.    can do HT leaning on basket / uses [place] parking / level into house and very sob if  carries groceries in vs better   a year ago=  clean house , walk around block, no [facility] parking  About 50% back to baseline since  on prednisone  Previously reports short courses of pred helped arthritic symptoms a lot but never on it more than a few weeks and is concerned about staying on it. rec Reduce prednisone to 10 mg each am    10/20/2014 f/u ov/Chidiebere Wynn re: pred 10 mg daily for PF related to ? RA  Chief Complaint  Patient presents with  . Acute Visit    Pt c/o weakness, dizziness and SOB for the past 6 days. She is also c/o prod cough with thick, clear sputum.    Not really sure breathing is worse so much as her fatigue is and this correlates best with resolution of chronic leg swelling while on lasix, her doe and arthritis may are really not changed on 20 vs 10 of prednisone.  rec No change prednisone pending labs but hold the lasix until swelling gets noticeable and take it  As needed .   11/09/2014 f/u ov/Kayvan Hoefling re: pf/ ? Assoc connective tissue dz  On 10 mg pred daily  Chief Complaint  Patient presents with  . Follow-up    Pt states that she is unchanged. She feels like she is having some increased nasal and chest congestion, has to clear her throat alot.     rec Try prednisone 10  mg one half  daily at breakfast Continue nexium 40 mg Take 30-60 min before first meal of the day and Pepcid 20 mg at supper  Please remember to go to the x-ray department downstairs for your tests - we will call you with the results when they are available. Please schedule a follow up office visit in 6 weeks, call sooner if needed - if worse increase prednisone to 10 mg daily       12/30/2014 f/u ov/Osha Errico re: sob Chief Complaint  Patient presents with  . Follow-up    Pt states that her congestion seems to be better. Her breathing is overall doing well. No new co's today.    Decreased lasix by half, increased prednisone to 10 mg daily after back pain / hands pain increased on the lower dose but not  sob/cough at lower dose rec Ok to maintain predisone at 10 mg per day since this dose helps arthritis and therefore probably more than adequate to cover lung inflammation I am deferring whether to refer you to rheumatology to Dr Shaw's capable hands   03/31/2015 f/u ov/Arvon Schreiner re: connective tissue dz/ PF   Chief Complaint  Patient presents with  . Follow-up    Pt states her breathing is unchanged since the last visit. No new co's today.    can do any shopping she wants as long as has a cart and hc parking and no cough or joint aches at 10 mg daily - really  Not limited by breathing from desired activities   rec No change rx = 10 mg pred daily    07/01/2015 f/u ov/Azyriah Nevins re: aches/ ild that appear pred responsive Chief Complaint  Patient presents with  . Follow-up    Pt states her breathing is about the same "maybe some worse".     On 10 mg daily thru 8/21 no aches and no change in doe so reduced dose on her own to 5 mg qod. Min dry daytime coughing                  rec Add pepcid ac 20 mg at bedtime and For drainage / throat tickle try take CHLORPHENIRAMINE  4 mg - take one every 4 hours as needed - available over the counter- may cause drowsiness so start with just a bedtime dose or two and see how you tolerate it before trying in daytime   Please remember to go to the   x-ray department downstairs for your tests - we will call you with the results when they are available. The fact you are so prednisone dependent strongly suggests you have a rheumatologic condition and may benefit from consultation with a  Rheumatologist No blood work today but I would add at least an ESR to the studies you will get tomorrow with Dr Shaw to evaluate your fatigue  Leave prednisone at 10 mg per day for now with option to increase to 20 mg if condition worsens  Please see patient coordinator before you leave today  to schedule :  Wear 02 2lpm only when walking     08/12/2015  Post hosp f/u ov/Kiyoko Mcguirt re:  steroid resp ild/ ? Underlying connective tissue dz/ resp failure  Chief Complaint  Patient presents with  . Hospitalization Follow-up    Post-inflammatory PF. Pt states she has been doing well on home O2/2L, wearing it 24h/day. Pt c/o of mostly dry cough, sometimes wet in the mornings. Pt also c/o of wheeze/SOB with exertion. Pt does   not use albuterol inhaler.   Presently on 40 mg of prednisone daily  Concentrator is set at 2lpm bedtime Portable system is full but unable to turn on Walked in off 02 / no sob x room to room.  rec Prednisone 30 mg daily x one week, 20 mg daily x one week, then 15 mg daily x 2 weeks then 10 mg is your new floor dose but ok to double if start to worsen. 02 instructions:  No need for 02 sitting or walking across the room but definitely wear 2lpm when walking outside your home and when get where you are going and sit down you can take it back off to save oxygen - also wear it 2lpm at bedtime automatically  [facility] is responsible for maintaining your equipment and explaining how it works but if there is any issue with them call Almyra Free at [phone] with the name of the person that's not helping you and we'll call them directly    10/15/2015  f/u ov/Breland Trouten re:  PF/ steroid resp component  Now at 20 mg per day  Chief Complaint  Patient presents with  . Follow-up    pt following for Postinflammatory pulmonary fibrosis:  pt states she is ding well, pt was in the hospital in hospital at the end of  November and feels she is progressing very well. no c/o of sob, wheezing, cough or chest tightness. pt is doing rehab at [facility] at [place]. pt using continuous O2 24/7 4LPM. DME: [facility]  rec Please remember to go to the lab  department downstairs for your tests - we will call you with the results when they are available. Leave prednisone 20 mg daily until next ov  Goal for 02 is to keep the saturations above 90%     11/26/2015  f/u ov/Emanuele Mcwhirter re: PF/ steroid dep  component @ 20 mg Chief Complaint  Patient presents with  . Follow-up    Pt states that her breathing is overall doing well. She has been more active lately.   monitoring her owns sats/ usually requiring 3lpm 24/7 but able to walk more now/ arthritis symptoms esp back better also at 20 mg per day     No obvious day to day or daytime variability or assoc   cp or chest tightness,  or overt sinus or hb symptoms. No unusual exp hx or h/o childhood pna/ asthma or knowledge of premature birth.  Sleeping ok without nocturnal  or early am exacerbation  of respiratory  c/o's or need for noct saba. Also denies any obvious fluctuation of symptoms with weather or environmental changes or other aggravating or alleviating factors except as outlined above   Current Medications, Allergies, Complete Past Medical History, Past Surgical History, Family History, and Social History were reviewed in [employer] record.  ROS  The following are not active complaints unless bolded sore throat, dysphagia, dental problems, itching, sneezing,  nasal congestion or excess/ purulent secretions, ear ache,   fever, chills, sweats, unintended wt loss, classically pleuritic or exertional cp, hemoptysis,  orthopnea pnd or leg swelling, presyncope, palpitations, abdominal pain, anorexia, nausea, vomiting, diarrhea  or change in bowel or bladder habits, change in stools or urine, dysuria,hematuria,  rash, arthralgias, visual complaints, headache, numbness, weakness or ataxia or problems with walking or coordination,  change in mood/affect or memory.        Pex  amb wf nad  09/28/2014    160  Vs 10/20/2014  160 >  11/09/2014 162 > 12/30/2014  160  > 03/31/2015 161 > 07/01/2015  162 > 10/15/2015   144 > 11/26/2015 149     08/31/14 158 lb (71.668 kg)  08/15/14 159 lb 2.8 oz (72.2 kg)  08/07/14 162 lb (73.483 kg)     HEENT: nl dentition, turbinates, and orophanx. Nl external ear canals without cough  reflex   NECK :  without JVD/Nodes/TM/ nl carotid upstrokes bilaterally   LUNGS: no acc muscle use,  Bilateral faint  basilar  insp crackles s cough on insp   CV:  RRR  no s3 or murmur or increase in P2, no edema   ABD:  soft and nontender with nl excursion in the supine position. No bruits or organomegaly, bowel sounds nl  MS:  warm without deformities, calf tenderness, cyanosis or clubbing  SKIN: warm and dry without lesions    NEURO:  alert, approp, no deficits       I personally reviewed images and agree with radiology impression as follows:  CXR:  10/02/15 Hypoinflation with evidence of known underlying chronic fibrosis. Worsening bilateral patchy mixed interstitial airspace density likely superimposed pneumonia. Mild cardiomegaly.   Labs ordered  10/15/2015     Lab Results  Component Value Date   ESRSEDRATE 45* 10/15/2015   ESRSEDRATE 65* 09/05/2015   ESRSEDRATE 63* 08/31/2015

## 2015-11-27 NOTE — Assessment & Plan Note (Addendum)
PFT's 03/10/10 FEV1 1.68 (130%) ratio 74 , DLCO 48% (vs 45% 04/22/07) corrects to 60%  See inpt adm 08/15/14  -CT 08/15/14 >> moderate CAD, UIP pattern of pulmonary fibrosis with b/l areas of GGO, BTX RUL  -ESR 08/16/14 >> 67 > started systemic steroids as inpt -Echo 08/16/14 >> mild LVH, EF 60 to 67%, diastolic dysfx  -Labs 61/95/09 >> ANA negative, RF 32, anti-CCP negative, SS-A/SS-B negative, SCL-70 negative  -PFT 08/17/14 >> FEV1 1.75 (115%), FEV1% 84, TLC 3.39 (73%), DLCO 45% - 08/31/14   Walked RA x 3 laps @ 185 ft each stopped due to  92% slow pace @ 40 mg per day> rec reduce to 20 mg daily  - 09/28/2014  Walked RA x 3 laps @ 185 ft each stopped due to  Slow to mod pace, some sob at end but sats 94%  - reduced pred to 10 mg daily 09/28/2014 > worse starting 10/14/14 so recheck esr 10/20/2014  - 10/20/2014  Walked RA x 3 laps @ 185 ft each stopped due to end of study, slow to mod pace, no sob or desats on 10 mg daily pred - 11/09/2014   Trial of prednisone 5 mg daily > flared in terms of arthritis > resumed 10 mg per day since around 12/07/14  - 03/31/2015  Walked RA x 3 laps @ 185 ft each stopped due to  End of study, some sob but no desat at nl pace at 10 mg pred per day  - 07/01/2015  Walked RA x 3 laps @ 185 ft each stopped due to min sob/ nl pace no desat    At a dose of 10 mg daily until 06/27/15 then changed herself to 5 mg qod> rec slow taper back to same floor - 07/28/2015   Walked RA  2 laps @ 185 ft each stopped due to  Fatigue/ nl pace, no desat @ 10 mg daily with ESR 35  - 10/15/2015 reports walking 300 ft at [facility] and stops due to sob with sats 88% on 4lpm with esr 45 on 20 mg pred per day  - 11/26/2015 reports improved walking on 3lpm @ 20 mg /day/ worse at lower doses > no change rx  The goal with a chronic steroid dependent illness is always arriving at the lowest effective dose that controls the disease/symptoms and not accepting a set "formula" which is based on statistics or guidelines  that don't always take into account patient  variability or the natural hx of the dz in every individual patient, which may well vary over time.  For now therefore I recommend the patient maintain  20 mg per day   I had an extended discussion with the patient reviewing all relevant studies completed to date and  lasting 15 to 20 minutes of a 25 minute visit  Offered rheumatology eval for steroid sparing potential > declined for now     Each maintenance medication was reviewed in detail including most importantly the difference between maintenance and prns and under what circumstances the prns are to be triggered using an action plan format that is not reflected in the computer generated alphabetically organized AVS.    Please see instructions for details which were reviewed in writing and the patient given a copy highlighting the part that I personally wrote and discussed at today's ov.

## 2015-11-27 NOTE — Assessment & Plan Note (Signed)
07/28/2015   Walked RA x one lap @ 185 stopped due to  desat to 86% nl pace 07/28/2015   Walked   2lpm   2 laps @ 185 ft each stopped due to fatigue, no sob or desat > rec 02 with walking only  - 08/12/2015   Walked RA x one lap @ 185 stopped due to  desat to 88%  then 2lpm > walked 2 laps nl pace s sob or desat - 10/15/2015 reports walking 300 ft at [facility] and stops due to sob with sats 88% on 4lpm with esr 45 on 20 mg pred per day - 11/26/2015 reports walking at [place]/ [facility] parking on 3lpm   rec as of 11/26/2015 = 3lpm 24/7     

## 2015-11-29 ENCOUNTER — Telehealth: Payer: Self-pay | Admitting: Internal Medicine

## 2015-11-29 DIAGNOSIS — J9611 Chronic respiratory failure with hypoxia: Secondary | ICD-10-CM

## 2015-11-29 NOTE — Telephone Encounter (Signed)
ATC PT, NA and no VM. WCB

## 2015-11-30 ENCOUNTER — Telehealth: Payer: Self-pay | Admitting: *Deleted

## 2015-11-30 MED ORDER — POTASSIUM CHLORIDE CRYS ER 20 MEQ PO TBCR: 20.0000 meq | EXTENDED_RELEASE_TABLET | Freq: Every day | ORAL | Status: DC

## 2015-11-30 MED ORDER — FUROSEMIDE 40 MG PO TABS: 40.0000 mg | ORAL_TABLET | Freq: Every morning | ORAL | Status: DC

## 2015-11-30 MED ORDER — PREDNISONE 10 MG PO TABS: ORAL_TABLET | ORAL | Status: DC

## 2015-11-30 MED ORDER — FUROSEMIDE 20 MG PO TABS: 20.0000 mg | ORAL_TABLET | Freq: Every evening | ORAL | Status: DC

## 2015-11-30 NOTE — Telephone Encounter (Signed)
Received call pt states she is needing refills on her Klor con & Lasix 40 mg & 20 mg. Verified pharmacy [facility]...Johny Chess

## 2015-11-30 NOTE — Telephone Encounter (Signed)
Called and spoke to pt. Pt is requesting refill of Pred 20mg , Lasix 20mg  and 40mg , and Potassium 89meq. Advised pt we could fill the prednisone but the other medication she would need to have those filled by her PCP. Pt also stated her DME ([facility]) states she is on 5lpm O2 instead of 3lpm which she is currently at and was confirmed by MW at last OV. Called [facility] at [phone] and spoke to [place] stated they will need another order stating what liter flow the pt is on (if not 5lpm). New order placed for 3lpm for POC. Pt aware. [name] verbalized understanding. Nothing further needed at this time.    Per MW: 11/26/2015 reports improved walking on 3lpm @ 20 mg /day/ worse at lower doses > no change rx

## 2016-01-05 ENCOUNTER — Telehealth: Payer: Self-pay | Admitting: Internal Medicine

## 2016-01-05 NOTE — Telephone Encounter (Signed)
Spoke with pt. She stated that she will keep in touch with pulmonary and let us know if things got worse. She is going to hold off on the blood work

## 2016-01-05 NOTE — Telephone Encounter (Signed)
Can't tell over the phone, always best to start with pcp eval since most of these symptoms are not likely related to pulmonary condition per se

## 2016-01-05 NOTE — Telephone Encounter (Signed)
Spoke with pt, c/o increased fatigue, weakness, sleepiness, sob with exertion.  S/s worsening X1-2 weeks. Pt unsure if this is related to her prednisone(20mg  qd)  or lasix(20mg  qd).   Denies cough, mucus production, fever.   Pt uses [facility] on [address].   Pt requesting further recs- has also reached out to PCP for recs-PCP in Epic.  MW please advise.  Thanks.     Patient Instructions       A Rheumatologist might be able to come up with an alternative to prednisone but for now leave at 20 mg daily   Goal for 02 is to keep over 90%  Please see patient coordinator before you leave today  to schedule [facility]ST [facility] evaluation for portable 02   Please schedule a follow up visit in 3 months but call sooner if needed

## 2016-01-05 NOTE — Telephone Encounter (Signed)
Please advise 

## 2016-01-05 NOTE — Telephone Encounter (Signed)
Looks like she thinks it may be related to the lasix or prednisone.  We can check her kidney function and other basic blood work - but I will leave management of the medications to pulmonary and rheumatology.

## 2016-01-05 NOTE — Telephone Encounter (Signed)
Spoke with pt. She states that she saw her PCP today. They are wanting MW to address these symptoms.  MW - please advise. Thanks.

## 2016-01-05 NOTE — Telephone Encounter (Signed)
Spoke with pt, scheduled tomorrow with MW.  Nothing further needed.

## 2016-01-05 NOTE — Telephone Encounter (Signed)
I can see her at 85 tomorrow but can't do anything over the phone - ok to double book

## 2016-01-05 NOTE — Telephone Encounter (Signed)
Patient Name: [name]  DOB: Jun 04, 1928    Initial Comment Caller states she is having extreme fatigue from her RX. She sleeps all the time and thirsty    Nurse Assessment  Nurse: Leilani Merl, RN, Heather Date/Time (Eastern Time): 01/05/2016 2:09:15 PM  Confirm and document reason for call. If symptomatic, describe symptoms. You must click the next button to save text entered. ---Caller states she is having extreme fatigue from her RX. She sleeps all the time and thirsty. It started about 2 weeks ago and has gradually gotten worse. She is more thirsty and is urinating frequently  Has the patient traveled out of the country within the last 30 days? ---Not Applicable  Does the patient have any new or worsening symptoms? ---Yes  Will a triage be completed? ---Yes  Related visit to physician within the last 2 weeks? ---No  Does the PT have any chronic conditions? (i.e. diabetes, asthma, etc.) ---Yes  List chronic conditions. ---See MR  Is this a behavioral health or substance abuse call? ---No     Guidelines    Guideline Title Affirmed Question Affirmed Notes  Weakness (Generalized) and Fatigue [1] MODERATE weakness (i.e., interferes with work, school, normal activities) AND [2] cause unknown (Exceptions: weakness with acute minor illness, or weakness from poor fluid intake)    Final Disposition User   See Physician within 4 Hours (or PCP triage) Facilities manager, RN, Conservator, museum/gallery states that she does not want to go to the ED, she would like for someone to let Dr. Quay Burow know what is going on and see what she thinks caller needs to do. Please call her today. she will also call her pulmonologist and let him know what is going on.   Referrals  GO TO FACILITY REFUSED   Disagree/Comply: Disagree  Disagree/Comply Reason: Disagree with instructions

## 2016-01-06 ENCOUNTER — Ambulatory Visit (INDEPENDENT_AMBULATORY_CARE_PROVIDER_SITE_OTHER)
Admission: RE | Admit: 2016-01-06 | Discharge: 2016-01-06 | Disposition: A | Discharge status: Home / Self Care | Payer: Medicare Other | Source: Ambulatory Visit | Attending: Internal Medicine | Admitting: Internal Medicine

## 2016-01-06 ENCOUNTER — Other Ambulatory Visit (INDEPENDENT_AMBULATORY_CARE_PROVIDER_SITE_OTHER): Payer: Medicare Other

## 2016-01-06 ENCOUNTER — Encounter: Payer: Self-pay | Admitting: Internal Medicine

## 2016-01-06 ENCOUNTER — Ambulatory Visit (INDEPENDENT_AMBULATORY_CARE_PROVIDER_SITE_OTHER): Payer: Medicare Other | Admitting: Internal Medicine

## 2016-01-06 ENCOUNTER — Other Ambulatory Visit: Payer: Medicare Other

## 2016-01-06 VITALS — BP 120/58 | HR 76 | Temp 97.4°F | Ht 64.0 in | Wt 147.0 lb

## 2016-01-06 DIAGNOSIS — R0600 Dyspnea, unspecified: Secondary | ICD-10-CM

## 2016-01-06 DIAGNOSIS — J8410 Pulmonary fibrosis, unspecified: Secondary | ICD-10-CM

## 2016-01-06 DIAGNOSIS — J9611 Chronic respiratory failure with hypoxia: Secondary | ICD-10-CM

## 2016-01-06 DIAGNOSIS — R0602 Shortness of breath: Secondary | ICD-10-CM | POA: Diagnosis not present

## 2016-01-06 LAB — BASIC METABOLIC PANEL
BUN: 18 mg/dL (ref 6–23)
CALCIUM: 10 mg/dL (ref 8.4–10.5)
CO2: 29 mEq/L (ref 19–32)
CREATININE: 0.87 mg/dL (ref 0.40–1.20)
Chloride: 98 mEq/L (ref 96–112)
GFR: 65.71 mL/min (ref >60.00)
GLUCOSE: 158 mg/dL — AB (ref 70–99)
Potassium: 4.9 mEq/L (ref 3.5–5.1)
SODIUM: 136 meq/L (ref 135–145)

## 2016-01-06 LAB — CBC WITH DIFFERENTIAL/PLATELET
BASOS ABS: 0 10*3/uL (ref 0.0–0.1)
Basophils Relative: 0 % (ref 0.0–3.0)
EOS ABS: 0 10*3/uL (ref 0.0–0.7)
Eosinophils Relative: 0 % (ref 0.0–5.0)
HEMATOCRIT: 37.7 % (ref 36.0–46.0)
Hemoglobin: 12.3 g/dL (ref 12.0–15.0)
LYMPHS ABS: 1.1 10*3/uL (ref 0.7–4.0)
LYMPHS PCT: 7.1 % — AB (ref 12.0–46.0)
MCHC: 32.7 g/dL (ref 30.0–36.0)
MCV: 84.3 fl (ref 78.0–100.0)
MONO ABS: 0.3 10*3/uL (ref 0.1–1.0)
Monocytes Relative: 1.8 % — ABNORMAL LOW (ref 3.0–12.0)
NEUTROS ABS: 13.9 10*3/uL — AB (ref 1.4–7.7)
NEUTROS PCT: 91.1 % — AB (ref 43.0–77.0)
PLATELETS: 293 10*3/uL (ref 150.0–400.0)
RBC: 4.47 Mil/uL (ref 3.87–5.11)
RDW: 16.3 % — AB (ref 11.5–15.5)
WBC: 15.2 10*3/uL — ABNORMAL HIGH (ref 4.0–10.5)

## 2016-01-06 LAB — TSH: TSH: 0.9 u[IU]/mL (ref 0.35–4.50)

## 2016-01-06 LAB — SEDIMENTATION RATE: SED RATE: 57 mm/h — AB (ref 0–22)

## 2016-01-06 LAB — BRAIN NATRIURETIC PEPTIDE: Pro B Natriuretic peptide (BNP): 362 pg/mL — ABNORMAL HIGH (ref 0.0–100.0)

## 2016-01-06 NOTE — Patient Instructions (Signed)
Please remember to go to the lab   department downstairs for your tests - we will call you with the results when they are available.  Leave prednisone at 20 mg daily   Ok to adjust up the 02 to keep the saturation above 90% while walking   Keep your previous appt

## 2016-01-06 NOTE — Progress Notes (Signed)
14 yowf never smoker referred by Dr Clelia Croft for eval of new onset sob early 2012 with clear evidence of pulmonary fibrosis ? Related to connective tissue dz 08/2014    History of Present Illness  November 28, 2010 1st pulmonary office eval for new onset doe x across the room, abupt onset right after NY's, assoc with increase cough getting better and chest tightness assoc also with nasal congestion.. sleeping better most of the nights without noct exac or early am excess mucus production.  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 Connective tissue dz related  Chief Complaint  Patient presents with  . HFU    Pt states that her breathing has improved, but not back to baseline. No new co's today.   improved to point where could do HT on day of ov on RA Prednisone 2 with breakfast rec Nexium should be 40 mg daily automatically  Take 30-60 min before first meal of the day and pepcid ac 20 mg at  Bedtime until you return  Reduce prednisone to 20 mg daily with bfast  GERD diet   09/28/2014 f/u ov/Wert re: PF/ assoc with arthritis/Pos RA/on prednisone new rx 08/24/14   Chief Complaint  Patient presents with  . Follow-up    Pt states that her breathing continues to improve, but not yet back to normal baseline.    can do HT leaning on basket / uses [facility] parking / level into house and very sob if  carries groceries in vs better   a year ago=  clean house , walk around block, no HC parking  About 50% back to baseline since  on prednisone  Previously reports short courses of pred helped arthritic symptoms a lot but never on it more than a few weeks and is concerned about staying on it. rec Reduce prednisone to 10 mg each am    10/20/2014 f/u ov/Wert re: pred 10 mg daily for PF related to ? RA  Chief Complaint  Patient presents with  . Acute Visit    Pt c/o weakness, dizziness and SOB for the past 6 days. She is also c/o prod cough with thick, clear sputum.    Not really sure breathing is worse so much as her fatigue is and this correlates best with resolution of chronic leg swelling while on lasix, her doe and arthritis may are really not changed on 20 vs 10 of prednisone.  rec No change prednisone pending labs but hold the lasix until swelling gets noticeable and take it  As needed .   11/09/2014 f/u ov/Wert re: pf/ ? Assoc connective tissue dz  On 10 mg pred daily  Chief Complaint  Patient presents with  . Follow-up    Pt states that she is unchanged. She feels like she is having some increased nasal and chest congestion, has to clear her throat alot.     rec Try prednisone 10  mg one half  daily at breakfast Continue nexium 40 mg Take 30-60 min before first meal of the day and Pepcid 20 mg at supper  Please remember to go to the x-ray department downstairs for your tests - we will call you with the results when they are available. Please schedule a follow up office visit in 6 weeks, call sooner if needed - if worse increase prednisone to 10 mg daily       12/30/2014 f/u ov/Wert re: sob Chief Complaint  Patient presents with  . Follow-up    Pt states that her congestion seems to be better. Her breathing is overall doing well. No new co's today.    Decreased lasix by half, increased prednisone to 10 mg daily after back pain / hands pain increased on the lower dose but not  sob/cough at lower dose rec Ok to maintain predisone at 10 mg per day since this dose helps arthritis and therefore probably more than adequate to cover lung inflammation I am deferring whether to refer you to rheumatology to Dr Shaw's capable hands   03/31/2015 f/u ov/Wert re: connective tissue dz/ PF   Chief Complaint  Patient presents with  . Follow-up    Pt states her breathing is unchanged since the last visit. No new co's today.    can do any shopping she wants as long as has a cart and hc parking and no cough or joint aches at 10 mg daily - really  Not limited by breathing from desired activities   rec No change rx = 10 mg pred daily    07/01/2015 f/u ov/Wert re: aches/ ild that appear pred responsive Chief Complaint  Patient presents with  . Follow-up    Pt states her breathing is about the same "maybe some worse".     On 10 mg daily thru 8/21 no aches and no change in doe so reduced dose on her own to 5 mg qod. Min dry daytime coughing                  rec Add pepcid ac 20 mg at bedtime and For drainage / throat tickle try take CHLORPHENIRAMINE  4 mg - take one every 4 hours as needed - available over the counter- may cause drowsiness so start with just a bedtime dose or two and see how you tolerate it before trying in daytime   Please remember to go to the   x-ray department downstairs for your tests - we will call you with the results when they are available. The fact you are so prednisone dependent strongly suggests you have a rheumatologic condition and may benefit from consultation with a  Rheumatologist No blood work today but I would add at least an ESR to the studies you will get tomorrow with Dr Shaw to evaluate your fatigue  Leave prednisone at 10 mg per day for now with option to increase to 20 mg if condition worsens  Please see patient coordinator before you leave today  to schedule :  Wear 02 2lpm only when walking     08/12/2015  Post hosp f/u ov/Wert re:  steroid resp ild/ ? Underlying connective tissue dz/ resp failure  Chief Complaint  Patient presents with  . Hospitalization Follow-up    Post-inflammatory PF. Pt states she has been doing well on home O2/2L, wearing it 24h/day. Pt c/o of mostly dry cough, sometimes wet in the mornings. Pt also c/o of wheeze/SOB with exertion. Pt does   not use albuterol inhaler.   Presently on 40 mg of prednisone daily  Concentrator is set at 2lpm bedtime Portable system is full but unable to turn on Walked in off 02 / no sob x room to room.  rec Prednisone 30 mg daily x one week, 20 mg daily x one week, then 15 mg daily x 2 weeks then 10 mg is your new floor dose but ok to double if start to worsen. 02 instructions:  No need for 02 sitting or walking across the room but definitely wear 2lpm when walking outside your home and when get where you are going and sit down you can take it back off to save oxygen - also wear it 2lpm at bedtime automatically  [facility] is responsible for maintaining your equipment and explaining how it works but if there is any issue with them call [facility] at [phone] with the name of the person that's not helping you and we'll call them directly    10/15/2015  f/u ov/Wert re:  PF/ steroid resp component  Now at 20 mg per day  Chief Complaint  Patient presents with  . Follow-up    pt following for Postinflammatory pulmonary fibrosis:  pt states she is ding well, pt was in the hospital in hospital at the end of  November and feels she is progressing very well. no c/o of sob, wheezing, cough or chest tightness. pt is doing rehab at [facility] [place]. pt using continuous O2 24/7 4LPM. DME: [facility]  rec Please remember to go to the lab  department downstairs for your tests - we will call you with the results when they are available. Leave prednisone 20 mg daily until next ov  Goal for 02 is to keep the saturations above 90%     11/26/2015  f/u ov/Wert re: PF/ steroid dep  component @ 20 mg Chief Complaint  Patient presents with  . Follow-up    Pt states that her breathing is overall doing well. She has been more active lately.   monitoring her owns sats/ usually requiring 3lpm 24/7 but able to walk more now/ arthritis symptoms esp back better also at 20 mg per day  rec A Rheumatologist might be able to come up with an alternative to prednisone but for now leave at 20 mg daily  Goal for 02 is to keep over 90% Please see patient coordinator before you leave today  to schedule [facility] evaluation for portable 02      01/06/2016 acute extended ov/Wert re:  PF/ steroid responsive mant rx =  20 mg per day  Chief Complaint  Patient presents with  . Acute Visit    Pt c/o weakness for the past few wks. She states that her sats are dropping with exertion despite wearing her o2- feels more SOB when this occurs. She is using albuterol inhaler 1 x per wk on average.   nl on 3lpm fine but for the last 3 days notes desats walking and did not try titrating up as rec    No obvious day to day or daytime variability or assoc  Cough  cp or chest tightness,  or overt sinus or hb symptoms. No unusual exp hx or h/o childhood pna/ asthma or knowledge of premature birth.  Sleeping ok without nocturnal  or early am exacerbation  of respiratory  c/o's or need for noct saba. Also denies any obvious fluctuation of symptoms with weather or environmental changes or other aggravating or  alleviating factors except as outlined above   Current Medications, Allergies, Complete Past Medical History, Past Surgical History, Family History, and Social History were reviewed in [employer] record.  ROS  The following are not active complaints unless bolded sore throat, dysphagia, dental problems, itching, sneezing,  nasal congestion or excess/ purulent secretions, ear ache,   fever, chills, sweats, unintended wt loss, classically pleuritic or exertional cp, hemoptysis,   orthopnea pnd or leg swelling, presyncope, palpitations, abdominal pain, anorexia, nausea, vomiting, diarrhea  or change in bowel or bladder habits, change in stools or urine, dysuria,hematuria,  rash, arthralgias not worse than baseline , visual complaints, headache, numbness, weakness or ataxia or problems with walking or coordination,  change in mood/affect or memory.        Pex  amb wf nad  09/28/2014    160  Vs 10/20/2014  160 > 11/09/2014 162 > 12/30/2014  160  > 03/31/2015 161 > 07/01/2015  162 > 10/15/2015   144 > 11/26/2015 149 > 01/06/2016  147     08/31/14 158 lb (71.668 kg)  08/15/14 159 lb 2.8 oz (72.2 kg)  08/07/14 162 lb (73.483 kg)     HEENT: nl dentition, turbinates, and orophanx. Nl external ear canals without cough reflex   NECK :  without JVD/Nodes/TM/ nl carotid upstrokes bilaterally   LUNGS: no acc muscle use,  Bilateral faint  basilar  insp crackles s cough on insp   CV:  RRR  no s3 or murmur or increase in P2,  no edema   ABD:  soft and nontender with nl excursion in the supine position. No bruits or organomegaly, bowel sounds nl  MS:  warm without deformities, calf tenderness, cyanosis or clubbing  SKIN: warm and dry without lesions    NEURO:  alert, approp, no deficits     CXR PA and Lateral:   01/06/2016 :    I personally reviewed images and agree with radiology impression as follows:    1. Chronic changes of pulmonary fibrosis. 2. No evidence for acute abnormality.    Labs ordered/ reviewed:     Chemistry      Component Value Date/Time   NA 136 01/06/2016 1422   K 4.9 01/06/2016 1422   CL 98 01/06/2016 1422   CO2 29 01/06/2016 1422   BUN 18 01/06/2016 1422   CREATININE 0.87 01/06/2016 1422   CREATININE 0.80 09/17/2014 1421      Component Value Date/Time   CALCIUM 10.0 01/06/2016 1422   ALKPHOS 48 11/19/2015 1418   AST 22 11/19/2015 1418   ALT 20 11/19/2015 1418   BILITOT 0.6 11/19/2015 1418        Lab Results  Component Value Date    WBC 15.2* 01/06/2016   HGB 12.3 01/06/2016   HCT 37.7 01/06/2016   MCV 84.3 01/06/2016   PLT 293.0 01/06/2016     Lab Results  Component Value Date   DDIMER 0.51* 01/06/2016      Lab Results  Component Value Date   TSH 0.90 01/06/2016     Lab Results  Component Value Date   PROBNP 362.0* 01/06/2016       Lab Results  Component Value Date   ESRSEDRATE 57* 01/06/2016   ESRSEDRATE 45* 10/15/2015   ESRSEDRATE 65* 09/05/2015

## 2016-01-07 ENCOUNTER — Other Ambulatory Visit: Payer: Self-pay | Admitting: Internal Medicine

## 2016-01-07 ENCOUNTER — Encounter: Payer: Self-pay | Admitting: Internal Medicine

## 2016-01-07 LAB — D-DIMER, QUANTITATIVE: D-DIMER: 0.51 mg/L FEU — ABNORMAL HIGH (ref 0.00–0.49)

## 2016-01-07 MED ORDER — PREDNISONE 10 MG PO TABS: ORAL_TABLET | ORAL | Status: DC

## 2016-01-07 NOTE — Assessment & Plan Note (Signed)
PFT's 03/10/10 FEV1 1.68 (130%) ratio 74 , DLCO 48% (vs 45% 04/22/07) corrects to 60%  See inpt adm 08/15/14  -CT 08/15/14 >> moderate CAD, UIP pattern of pulmonary fibrosis with b/l areas of GGO, BTX RUL  -ESR 08/16/14 >> 67 > started systemic steroids as inpt -Echo 08/16/14 >> mild LVH, EF 60 to 25%, diastolic dysfx  -Labs 05/39/76 >> ANA negative, RF 32, anti-CCP negative, SS-A/SS-B negative, SCL-70 negative  -PFT 08/17/14 >> FEV1 1.75 (115%), FEV1% 84, TLC 3.39 (73%), DLCO 45% - 08/31/14   Walked RA x 3 laps @ 185 ft each stopped due to  92% slow pace @ 40 mg per day> rec reduce to 20 mg daily  - 09/28/2014  Walked RA x 3 laps @ 185 ft each stopped due to  Slow to mod pace, some sob at end but sats 94%  - reduced pred to 10 mg daily 09/28/2014 > worse starting 10/14/14 so recheck esr 10/20/2014  - 10/20/2014  Walked RA x 3 laps @ 185 ft each stopped due to end of study, slow to mod pace, no sob or desats on 10 mg daily pred - 11/09/2014   Trial of prednisone 5 mg daily > flared in terms of arthritis > resumed 10 mg per day since around 12/07/14  - 03/31/2015  Walked RA x 3 laps @ 185 ft each stopped due to  End of study, some sob but no desat at nl pace at 10 mg pred per day  - 07/01/2015  Walked RA x 3 laps @ 185 ft each stopped due to min sob/ nl pace no desat    At a dose of 10 mg daily until 06/27/15 then changed herself to 5 mg qod> rec slow taper back to same floor - 07/28/2015   Walked RA  2 laps @ 185 ft each stopped due to  Fatigue/ nl pace, no desat @ 10 mg daily with ESR 35  - 10/15/2015 reports walking 300 ft at [facility] and stops due to sob with sats 88% on 4lpm with esr 45 on 20 mg pred per day  - 11/26/2015 reports improved walking on 3lpm @ 20 mg /day/ worse at lower doses > no change rx - 01/06/2016   Worse sats on 3/ needing up to 4 and esr up to 57 > rec 20 bid until better then 20 mg daily and Rheum eval   I see no other explanation for her deterioration and esr has paralleled resp dz in past  suggesting unaddressed inflammation now  The goal with a chronic steroid dependent illness is always arriving at the lowest effective dose that controls the disease/symptoms and not accepting a set "formula" which is based on statistics or guidelines that don't always take into account patient  variability or the natural hx of the dz in every individual patient, which may well vary over time.  For now therefore I recommend the patient maintain  40 mg until better then 20 mg daily and rheum eval  I had an extended discussion with the patient reviewing all relevant studies completed to date and  lasting 25 minutes of a 40  Minute acute visit    Each maintenance medication was reviewed in detail including most importantly the difference between maintenance and prns and under what circumstances the prns are to be triggered using an action plan format that is not reflected in the computer generated alphabetically organized AVS.    Please see instructions for details which were reviewed in  writing and the patient given a copy highlighting the part that I personally wrote and discussed at today's ov.

## 2016-01-07 NOTE — Progress Notes (Signed)
Quick Note:  Spoke with pt and notified of results per Dr. Wert. Pt verbalized understanding and denied any questions.  ______ 

## 2016-01-07 NOTE — Assessment & Plan Note (Signed)
07/28/2015   Walked RA x one lap @ 185 stopped due to  desat to 86% nl pace 07/28/2015   Walked   2lpm   2 laps @ 185 ft each stopped due to fatigue, no sob or desat > rec 02 with walking only  - 08/12/2015   Walked RA x one lap @ 185 stopped due to  desat to 88%  then 2lpm > walked 2 laps nl pace s sob or desat - 10/15/2015 reports walking 300 ft at [facility] and stops due to sob with sats 88% on 4lpm with esr 45 on 20 mg pred per day - 11/26/2015 reports walking at [place]/ [facility] parking on 3lpm  - 01/06/2016   Walked RA x one lap @ 185 stopped due to  Sob / desats to 88% with ESR 57 so rec pred 20 bid until better then back to 20 mg       rec as of 01/06/2016 = 3lpm 24/7 but increase to 4lpm with walking

## 2016-01-07 NOTE — Assessment & Plan Note (Signed)
Note bnp is down from prev values so not likely chf  Note D dimer nl - while  A high  nl valute  may miss small peripheral pe, the clot burden with sob is moderately high and the d dimer has a very high neg pred value in this setting    No other explanation for her symptoms but pf (see separate a/p)

## 2016-01-09 ENCOUNTER — Other Ambulatory Visit: Payer: Self-pay | Admitting: Internal Medicine

## 2016-01-10 NOTE — Telephone Encounter (Signed)
Last OV 11/19/15. I dont see that you have filled this RX before. Okay to fill?

## 2016-02-05 HISTORY — PX: CATARACT EXTRACTION: SUR2

## 2016-02-08 DIAGNOSIS — H02833 Dermatochalasis of right eye, unspecified eyelid: Secondary | ICD-10-CM | POA: Diagnosis not present

## 2016-02-08 DIAGNOSIS — H25811 Combined forms of age-related cataract, right eye: Secondary | ICD-10-CM | POA: Diagnosis not present

## 2016-02-08 DIAGNOSIS — J8410 Pulmonary fibrosis, unspecified: Secondary | ICD-10-CM | POA: Diagnosis not present

## 2016-02-08 DIAGNOSIS — H25813 Combined forms of age-related cataract, bilateral: Secondary | ICD-10-CM | POA: Diagnosis not present

## 2016-02-17 DIAGNOSIS — H25812 Combined forms of age-related cataract, left eye: Secondary | ICD-10-CM | POA: Diagnosis not present

## 2016-02-17 DIAGNOSIS — H2512 Age-related nuclear cataract, left eye: Secondary | ICD-10-CM | POA: Diagnosis not present

## 2016-02-18 ENCOUNTER — Ambulatory Visit: Payer: Medicare Other | Admitting: Internal Medicine

## 2016-02-22 ENCOUNTER — Encounter: Payer: Self-pay | Admitting: Internal Medicine

## 2016-02-22 ENCOUNTER — Ambulatory Visit (INDEPENDENT_AMBULATORY_CARE_PROVIDER_SITE_OTHER): Payer: Medicare Other | Admitting: Internal Medicine

## 2016-02-22 VITALS — BP 126/64 | HR 92 | Temp 97.6°F | Resp 18 | Wt 147.0 lb

## 2016-02-22 DIAGNOSIS — F329 Major depressive disorder, single episode, unspecified: Secondary | ICD-10-CM

## 2016-02-22 DIAGNOSIS — I10 Essential (primary) hypertension: Secondary | ICD-10-CM

## 2016-02-22 DIAGNOSIS — J8410 Pulmonary fibrosis, unspecified: Secondary | ICD-10-CM

## 2016-02-22 DIAGNOSIS — F32A Depression, unspecified: Secondary | ICD-10-CM

## 2016-02-22 DIAGNOSIS — K219 Gastro-esophageal reflux disease without esophagitis: Secondary | ICD-10-CM | POA: Diagnosis not present

## 2016-02-22 DIAGNOSIS — I5032 Chronic diastolic (congestive) heart failure: Secondary | ICD-10-CM | POA: Diagnosis not present

## 2016-02-22 NOTE — Progress Notes (Signed)
Pre visit review using our clinic review tool, if applicable. No additional management support is needed unless otherwise documented below in the visit note. 

## 2016-02-22 NOTE — Progress Notes (Addendum)
Subjective:    Patient ID: [name], female    DOB: 1927/12/02, 82 y.o.   MRN: [id number]  HPI She is here for follow up.  COPD:  She is on her oxygen 4 L/min and is on the prednisone 20 mg daily.  She is following with pulmonary regularly.  She feels she is at her baseline - she has shortness of breath with exertion, coughs up white thick phlegm and occasional wheezing.     GERD:  She is taking her medication daily as prescribed.  She denies any GERD symptoms and feels her GERD is well controlled.   Depression: She is taking her medication daily as prescribed. She denies any side effects from the medication. She feels her depression is well controlled and she is happy with her current dose of medication.   Diastolic CHF:  She stopped taking the 20 mg of lasix at night due to incrased urination.  She denies increased sob or leg swelling.   Medications and allergies reviewed with patient and updated if appropriate.  Patient Active Problem List   Diagnosis Date Noted  . Weakness generalized   . Dysphagia   . Pyrexia   . Sepsis ([place]) 09/05/2015  . Palliative care encounter 09/03/2015  . DNR (do not resuscitate) 09/03/2015  . Bronchitis, chronic obstructive w acute bronchitis ([place]) 09/02/2015  . HCAP (healthcare-associated pneumonia) 09/01/2015  . Dyspnea 08/31/2015  . Pain   . Depression 08/22/2015  . Chronic hyponatremia 08/03/2015  . Chronic diastolic CHF (congestive heart failure) ([place]) 08/01/2015  . SOB (shortness of breath) 08/01/2015  . Chronic respiratory failure with hypoxia ([place]) 07/28/2015  . Obesity 07/01/2015  . Hyperlipidemia 09/17/2014  . Coronary artery disease 08/17/2014  . Leukocytosis 08/17/2014  . Protein-calorie malnutrition ([place]) 08/17/2014  . Postinflammatory pulmonary fibrosis ([place]) 08/16/2014  . Change in bowel habits 07/24/2012  . Special screening for malignant neoplasms, colon 12/08/2011  . Stricture and stenosis of esophagus 12/08/2011  .  NEOPLASM, MALIGNANT, RIGHT BREAST 11/28/2010  . OBSTRUCTIVE SLEEP APNEA 11/28/2010  . Essential hypertension 11/28/2010  . ALLERGIC RHINITIS 11/28/2010  . GERD 11/28/2010    Current Outpatient Prescriptions on File Prior to Visit  Medication Sig Dispense Refill  . acetaminophen (TYLENOL) 650 MG CR tablet Take 1,300 mg by mouth every 8 (eight) hours as needed for pain.    Marland Kitchen albuterol (PROVENTIL HFA;VENTOLIN HFA) 108 (90 BASE) MCG/ACT inhaler Inhale 2 puffs into the lungs every 4 (four) hours as needed for wheezing or shortness of breath.    Marland Kitchen aspirin EC 325 MG EC tablet Take 1 tablet (325 mg total) by mouth daily. 30 tablet 0  . benzonatate (TESSALON) 200 MG capsule Take 1 capsule (200 mg total) by mouth 3 (three) times daily as needed for cough. 20 capsule 0  . Cholecalciferol (VITAMIN D) 2000 UNITS tablet Take 2,000 Units by mouth at bedtime.    Marland Kitchen esomeprazole (NEXIUM) 20 MG capsule Take 40 mg by mouth every morning.     . fluticasone (FLONASE) 50 MCG/ACT nasal spray Place 2 sprays into both nostrils 2 (two) times daily as needed for allergies or rhinitis.     . furosemide (LASIX) 20 MG tablet Take 1 tablet (20 mg total) by mouth every evening. 30 tablet 5  . furosemide (LASIX) 40 MG tablet Take 1 tablet (40 mg total) by mouth every morning. 30 tablet 5  . ipratropium-albuterol (DUONEB) 0.5-2.5 (3) MG/3ML SOLN Take 3 mLs by nebulization every 6 (six) hours as  needed. 360 mL 0  . Magnesium 250 MG TABS Take 250 mg by mouth at bedtime.     Marland Kitchen MELATONIN PO Take 1 tablet by mouth at bedtime.    . OXYGEN Oxygen 3lpm 24/7 DME- [facility]    . polyethylene glycol (MIRALAX / GLYCOLAX) packet Take 17 g by mouth daily as needed. 14 each 0  . potassium chloride SA (K-DUR,KLOR-CON) 20 MEQ tablet Take 1 tablet (20 mEq total) by mouth daily. 30 tablet 5  . predniSONE (DELTASONE) 10 MG tablet 20 mg daily 60 tablet 1  . senna-docusate (SENOKOT-S) 8.6-50 MG tablet Take 1 tablet by mouth at bedtime  as needed for mild constipation.    . sertraline (ZOLOFT) 100 MG tablet TAKE 1 TABLET BY MOUTH EVERY DAY 30 tablet 5  . sodium chloride (OCEAN) 0.65 % SOLN nasal spray Place 1 spray into both nostrils 2 (two) times daily. (Patient taking differently: Place 1 spray into both nostrils daily as needed for congestion. )  0  . traMADol (ULTRAM) 50 MG tablet Take 1 tablet (50 mg total) by mouth every 6 (six) hours as needed for moderate pain. 20 tablet 0   No current facility-administered medications on file prior to visit.    Past Medical History  Diagnosis Date  . Dyspnea   . Diverticulosis 2008  . Internal hemorrhoids   . Esophageal stricture   . Arthritis   . Breast cancer ([place]) 1969  . GERD (gastroesophageal reflux disease)   . Gallstones   . Depression   . HLD (hyperlipidemia)   . HTN (hypertension)   . Obesity   . Colon polyp 2013    TUBULAR ADENOMA (X1  . Pulmonary fibrosis ([place])   . CAD (coronary artery disease)   . Chronic diastolic CHF (congestive heart failure) ([place]) 08/01/2015    Past Surgical History  Procedure Laterality Date  . Abdominal hysterectomy    . Cholecystectomy    . Mastectomy      right  . Appendectomy    . Foot surgery    . Breast surgery      Social History   Social History  . Marital Status: Married    Spouse Name: N/A  . Number of Children: 0  . Years of Education: N/A   Occupational History  . retired    Social History Main Topics  . Smoking status: Never Smoker   . Smokeless tobacco: Never Used  . Alcohol Use: 4.2 oz/week    7 Glasses of wine per week     Comment: ocaasional  . Drug Use: No  . Sexual Activity: Not on file   Other Topics Concern  . Not on file   Social History Narrative    Family History  Problem Relation Age of Onset  . Heart disease Mother   . Cancer Mother     bladder  . Esophageal cancer Brother   . Prostate cancer Brother   . Heart disease Brother   . Cancer Brother     sarcoma  . Stomach cancer  Neg Hx     Review of Systems  Constitutional: Negative for fever and appetite change.  Respiratory: Positive for cough (clear, thick mucus), shortness of breath (with any exertion) and wheezing (rare).   Cardiovascular: Positive for palpitations (with exertion) and leg swelling (controlled). Negative for chest pain.  Gastrointestinal: Positive for constipation (controlled with miralax as needed). Negative for abdominal pain.  Neurological: Positive for light-headedness (with exertion). Negative for headaches.  Objective:   Filed Vitals:   02/22/16 1428  BP: 126/64  Pulse: 92  Temp: 97.6 F (36.4 C)  Resp: 18   Filed Weights   02/22/16 1428  Weight: 147 lb (66.679 kg)   Body mass index is 25.22 kg/(m^2).   Physical Exam Constitutional: Appears well-developed and well-nourished. No distress.  Neck: Neck supple. No tracheal deviation present. No thyromegaly present.  No carotid bruit. No cervical adenopathy.   Cardiovascular: Normal rate, regular rhythm and normal heart sounds.   No murmur heard.  No edema Pulmonary/Chest: Effort normal and breath sounds normal. No respiratory distress. No wheezes.        Assessment & Plan:   Pulmonary fibrosis: Stable  On 4 L/m of oxygen and chronic steroids Following with pulmonary   On chronic steroids - due for a dexa, which we discussed - she is not sure if she wants to have it done and will think about it  See Problem List for Assessment and Plan of chronic medical problems.  F/u in 6 months

## 2016-02-22 NOTE — Patient Instructions (Addendum)
Consider having a bone density scan.    All other Health Maintenance issues reviewed.   All recommended immunizations and age-appropriate screenings are up-to-date or discussed.  No immunizations administered today.   Medications reviewed and updated.  No changes recommended at this time.  Please followup in 6 months

## 2016-02-22 NOTE — Assessment & Plan Note (Signed)
GERD controlled Continue nexium daily

## 2016-02-23 ENCOUNTER — Encounter: Payer: Self-pay | Admitting: Internal Medicine

## 2016-02-23 NOTE — Assessment & Plan Note (Signed)
BP well controlled Current regimen effective and well tolerated Continue current medications at current doses  

## 2016-02-23 NOTE — Assessment & Plan Note (Signed)
She decreased lasix to 40 mg Qam due to excessive urination No evidence of fluid overload on exam Continue lasix 40 mg in am Recent cmp reviewed

## 2016-02-23 NOTE — Assessment & Plan Note (Signed)
Stable, controlled Continue sertaline 100 mg dialy

## 2016-02-25 ENCOUNTER — Encounter: Payer: Self-pay | Admitting: Internal Medicine

## 2016-02-25 ENCOUNTER — Other Ambulatory Visit (INDEPENDENT_AMBULATORY_CARE_PROVIDER_SITE_OTHER): Payer: Medicare Other

## 2016-02-25 ENCOUNTER — Ambulatory Visit (INDEPENDENT_AMBULATORY_CARE_PROVIDER_SITE_OTHER): Payer: Medicare Other | Admitting: Internal Medicine

## 2016-02-25 VITALS — BP 118/66 | HR 81 | Ht 61.0 in | Wt 147.0 lb

## 2016-02-25 DIAGNOSIS — J9611 Chronic respiratory failure with hypoxia: Secondary | ICD-10-CM | POA: Diagnosis not present

## 2016-02-25 DIAGNOSIS — J8410 Pulmonary fibrosis, unspecified: Secondary | ICD-10-CM

## 2016-02-25 LAB — SEDIMENTATION RATE: SED RATE: 39 mm/h — AB (ref 0–22)

## 2016-02-25 MED ORDER — PREDNISONE 20 MG PO TABS: 20.0000 mg | ORAL_TABLET | Freq: Every day | ORAL | Status: DC

## 2016-02-25 NOTE — Assessment & Plan Note (Signed)
07/28/2015   Walked RA x one lap @ 185 stopped due to  desat to 86% nl pace 07/28/2015   Walked   2lpm   2 laps @ 185 ft each stopped due to fatigue, no sob or desat > rec 02 with walking only  - 08/12/2015   Walked RA x one lap @ 185 stopped due to  desat to 88%  then 2lpm > walked 2 laps nl pace s sob or desat - 10/15/2015 reports walking 300 ft at [facility] and stops due to sob with sats 88% on 4lpm with esr 45 on 20 mg pred per day - 11/26/2015 reports walking at [place]/ [facility] parking on 3lpm  - 01/06/2016   Walked RA x one lap @ 185 stopped due to  Sob / desats to 88% with ESR 57 so rec pred 20 bid until better then back to 20 mg       rec as of 02/25/2016 = 3lpm 24/7 but increase to 4lpm with walking

## 2016-02-25 NOTE — Patient Instructions (Addendum)
Please remember to go to the lab  department downstairs for your tests - we will call you with the results when they are available.  If breathing /cough /arthritis worse > double prednisone until better then decrease to 20 mg daily   Please schedule a follow up visit in 3 months but call sooner if needed

## 2016-02-25 NOTE — Progress Notes (Signed)
69 yowf never smoker referred by Dr Brigitte Pulse for eval of new onset sob early 2012 with clear evidence of pulmonary fibrosis ? Related to connective tissue dz 08/2014    History of Present Illness  November 28, 2010 1st pulmonary office eval for new onset doe x across the room, abupt onset right after [place]'s, assoc with increase cough getting better and chest tightness assoc also with nasal congestion.. sleeping better most of the nights without noct exac or early am excess mucus production.  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 Connective tissue dz related  Chief Complaint  Patient presents with  . HFU    Pt states that her breathing has improved, but not back to baseline. No new co's today.   improved to point where could do HT on day of ov on RA Prednisone 2 with breakfast rec Nexium should be 40 mg daily automatically  Take 30-60 min before first meal of the day and pepcid ac 20 mg at  Bedtime until you return  Reduce prednisone to 20 mg daily with bfast  GERD diet   09/28/2014 f/u ov/Keean Wilmeth re: PF/ assoc with arthritis/Pos RA/on prednisone new rx 08/24/14   Chief Complaint  Patient presents with  . Follow-up    Pt states that her breathing continues to improve, but not yet back to normal baseline.    can do HT leaning on basket / uses [employer] parking / level into house and very sob if  carries groceries in vs better   a year ago=  clean house , walk around block, no HC parking  About 50% back to baseline since  on prednisone  Previously reports short courses of pred helped arthritic symptoms a lot but never on it more than a few weeks and is concerned about staying on it. rec Reduce prednisone to 10 mg each am    10/20/2014 f/u ov/Dannielle Baskins re: pred 10 mg daily for PF related to ? RA  Chief Complaint  Patient presents with  . Acute Visit    Pt c/o weakness, dizziness and SOB for the past 6 days. She is also c/o prod cough with thick, clear sputum.    Not really sure breathing is worse so much as her fatigue is and this correlates best with resolution of chronic leg swelling while on lasix, her doe and arthritis may are really not changed on 20 vs 10 of prednisone.  rec No change prednisone pending labs but hold the lasix until swelling gets noticeable and take it  As needed    11/09/2014 f/u ov/Myka Lukins re: pf/ ? Assoc connective tissue dz  On 10 mg pred daily  Chief Complaint  Patient presents with  . Follow-up    Pt states that she is unchanged. She feels like she is having some increased nasal and chest congestion, has to clear her throat alot.     rec Try prednisone 10  mg one half  daily at breakfast Continue nexium 40 mg Take 30-60 min before first meal of the day and Pepcid 20 mg at supper  Please remember to go to the x-ray department downstairs for your tests - we will call you with the results when they are available. Please schedule a follow up office visit in 6 weeks, call sooner if needed - if worse increase prednisone to 10 mg daily       12/30/2014 f/u ov/ re: sob Chief Complaint  Patient presents with  . Follow-up    Pt states that her congestion seems to be better. Her breathing is overall doing well. No new co's today.    Decreased lasix by half, increased prednisone to 10 mg daily after back pain / hands pain increased on the lower dose but not  sob/cough at lower dose rec Ok to maintain predisone at 10 mg per day since this dose helps arthritis and therefore probably more than adequate to cover lung inflammation I am deferring whether to refer you to rheumatology to Dr Shaw's capable hands   03/31/2015 f/u ov/ re: connective tissue dz/ PF   Chief Complaint  Patient presents with  . Follow-up    Pt states her breathing is unchanged since the last visit. No new co's today.    can do any shopping she wants as long as has a cart and hc parking and no cough or joint aches at 10 mg daily - really  Not limited by breathing from desired activities   rec No change rx = 10 mg pred daily    07/01/2015 f/u ov/ re: aches/ ild that appear pred responsive Chief Complaint  Patient presents with  . Follow-up    Pt states her breathing is about the same "maybe some worse".     On 10 mg daily thru 8/21 no aches and no change in doe so reduced dose on her own to 5 mg qod. Min dry daytime coughing                  rec Add pepcid ac 20 mg at bedtime and For drainage / throat tickle try take CHLORPHENIRAMINE  4 mg - take one every 4 hours as needed - available over the counter- may cause drowsiness so start with just a bedtime dose or two and see how you tolerate it before trying in daytime   Please remember to go to the   x-ray department downstairs for your tests - we will call you with the results when they are available. The fact you are so prednisone dependent strongly suggests you have a rheumatologic condition and may benefit from consultation with a  Rheumatologist No blood work today but I would add at least an ESR to the studies you will get tomorrow with Dr Shaw to evaluate your fatigue  Leave prednisone at 10 mg per day for now with option to increase to 20 mg if condition worsens  Please see patient coordinator before you leave today  to schedule :  Wear 02 2lpm only when walking     08/12/2015  Post hosp f/u ov/ re:  steroid resp ild/ ? Underlying connective tissue dz/ resp failure  Chief Complaint  Patient presents with  . Hospitalization Follow-up    Post-inflammatory PF. Pt states she has been doing well on home O2/2L, wearing it 24h/day. Pt c/o of mostly dry cough, sometimes wet in the mornings. Pt also c/o of wheeze/SOB with exertion. Pt does   not use albuterol inhaler.   Presently on 40 mg of prednisone daily  Concentrator is set at 2lpm bedtime Portable system is full but unable to turn on Walked in off 02 / no sob x room to room.  rec Prednisone 30 mg daily x one week, 20 mg daily x one week, then 15 mg daily x 2 weeks then 10 mg is your new floor dose but ok to double if start to worsen. 02 instructions:  No need for 02 sitting or walking across the room but definitely wear 2lpm when walking outside your home and when get where you are going and sit down you can take it back off to save oxygen - also wear it 2lpm at bedtime automatically  [facility] is responsible for maintaining your equipment and explaining how it works but if there is any issue with them call Libby at [phone] with the name of the person that's not helping you and we'll call them directly       11/26/2015  f/u ov/ re: PF/ steroid dep component @ 20 mg Chief Complaint  Patient presents with  . Follow-up    Pt states that her breathing is overall doing well. She has been more active lately.   monitoring her owns sats/ usually requiring 3lpm 24/7 but able to walk more now/ arthritis symptoms esp back better also at 20 mg per day  rec A Rheumatologist might be able to come up with an alternative to prednisone but for now leave at 20 mg daily  Goal for 02 is to keep over 90% Please see patient coordinator before you leave today  to schedule [facility]ST [facility] evaluation for portable 02       02/25/2016  f/u ov/ re: PF/ steroid responsive now at 20 mg maint/ 3-4lpm np 24/7  Chief Complaint  Patient presents with  .  Follow-up    Pt c/o increased fatigue since had cataract surgery a wk ago and pred was increased and then weened back to 20 mg.   arthritis completely resolved/ plans bone density next week/ not needing saba at all.   No obvious day to day or daytime variability or assoc  Cough  cp or chest tightness,  or overt sinus or hb symptoms. No unusual exp hx or h/o childhood pna/ asthma or knowledge of premature birth.  Sleeping ok without nocturnal  or early am exacerbation  of respiratory  c/o's or need for noct saba. Also denies any obvious fluctuation of symptoms with weather or environmental changes or other aggravating or alleviating factors except as outlined above   Current Medications, Allergies, Complete Past Medical History, Past Surgical History, Family History, and Social History were reviewed in [address] [facility] electronic medical record.  ROS  The following are not active complaints unless bolded sore throat, dysphagia, dental problems, itching, sneezing,  nasal congestion or excess/ purulent secretions, ear ache,   fever, chills, sweats, unintended wt loss, classically pleuritic or exertional cp, hemoptysis,  orthopnea pnd or leg swelling, presyncope, palpitations, abdominal pain, anorexia, nausea, vomiting, diarrhea  or change in bowel or bladder habits, change in stools or urine, dysuria,hematuria,  rash, arthralgias  , visual complaints, headache, numbness, weakness or ataxia or problems with walking or coordination,  change in mood/affect or memory.        Pex  amb wf nad  09/28/2014    160  Vs 10/20/2014  160 > 11/09/2014 162 > 12/30/2014  160  > 03/31/2015 161 > 07/01/2015    162 > 10/15/2015   144 >  02/25/2016  147      08/31/14 158 lb (71.668 kg)  08/15/14 159 lb 2.8 oz (72.2 kg)  08/07/14 162 lb (73.483 kg)     HEENT: nl dentition, turbinates, and orophanx. Nl external ear canals without cough reflex   NECK :  without JVD/Nodes/TM/ nl carotid upstrokes bilaterally   LUNGS: no  acc muscle use,  Bilateral faint  basilar  insp crackles s cough on insp   CV:  RRR  no s3 or murmur or increase in P2,  no edema   ABD:  soft and nontender with nl excursion in the supine position. No bruits or organomegaly, bowel sounds nl  MS:  warm without deformities, calf tenderness, cyanosis or clubbing  SKIN: warm and dry without lesions    NEURO:  alert, approp, no deficits                  Lab Results  Component Value Date   ESRSEDRATE 39* 02/25/2016   ESRSEDRATE 57* 01/06/2016   ESRSEDRATE 45* 10/15/2015                  

## 2016-02-25 NOTE — Assessment & Plan Note (Signed)
PFT's 03/10/10 FEV1 1.68 (130%) ratio 74 , DLCO 48% (vs 45% 04/22/07) corrects to 60%  See inpt adm 08/15/14  -CT 08/15/14 >> moderate CAD, UIP pattern of pulmonary fibrosis with b/l areas of GGO, BTX RUL  -ESR 08/16/14 >> 67 > started systemic steroids as inpt -Echo 08/16/14 >> mild LVH, EF 60 to 62%, diastolic dysfx  -Labs 37/62/83 >> ANA negative, RF 32, anti-CCP negative, SS-A/SS-B negative, SCL-70 negative  -PFT 08/17/14 >> FEV1 1.75 (115%), FEV1% 84, TLC 3.39 (73%), DLCO 45% - 08/31/14   Walked RA x 3 laps @ 185 ft each stopped due to  92% slow pace @ 40 mg per day> rec reduce to 20 mg daily  - 09/28/2014  Walked RA x 3 laps @ 185 ft each stopped due to  Slow to mod pace, some sob at end but sats 94%  - reduced pred to 10 mg daily 09/28/2014 > worse starting 10/14/14 so recheck esr 10/20/2014  - 10/20/2014  Walked RA x 3 laps @ 185 ft each stopped due to end of study, slow to mod pace, no sob or desats on 10 mg daily pred - 11/09/2014   Trial of prednisone 5 mg daily > flared in terms of arthritis > resumed 10 mg per day since around 12/07/14  - 03/31/2015  Walked RA x 3 laps @ 185 ft each stopped due to  End of study, some sob but no desat at nl pace at 10 mg pred per day  - 07/01/2015  Walked RA x 3 laps @ 185 ft each stopped due to min sob/ nl pace no desat    At a dose of 10 mg daily until 06/27/15 then changed herself to 5 mg qod> rec slow taper back to same floor - 07/28/2015   Walked RA  2 laps @ 185 ft each stopped due to  Fatigue/ nl pace, no desat @ 10 mg daily with ESR 35  - 10/15/2015 reports walking 300 ft at [facility] and stops due to sob with sats 88% on 4lpm with esr 45 on 20 mg pred per day  - 11/26/2015 reports improved walking on 3lpm @ 20 mg /day/ worse at lower doses > no change rx - 01/06/2016   Worse sats on 3/ needing up to 4 and esr up to 57 > rec 20 bid until better then 20 mg daily and Rheum eval - ESR new low of 39 02/25/2016 > rec 20 mg daily, ok to double for flare  The goal with a  chronic steroid dependent illness is always arriving at the lowest effective dose that controls the disease/symptoms and not accepting a set "formula" which is based on statistics or guidelines that don't always take into account patient  variability or the natural hx of the dz in every individual patient, which may well vary over time.  For now therefore I recommend the patient maintain  A floor of 20 and a ceiling of 40 mg if flares  I had an extended discussion with the patient reviewing all relevant studies completed to date and  lasting 15 to 20 minutes of a 25 minute visit    Each maintenance medication was reviewed in detail including most importantly the difference between maintenance and prns and under what circumstances the prns are to be triggered using an action plan format that is not reflected in the computer generated alphabetically organized AVS.    Please see instructions for details which were reviewed in writing and the patient  given a copy highlighting the part that I personally wrote and discussed at today's ov.

## 2016-03-22 ENCOUNTER — Telehealth: Payer: Self-pay

## 2016-03-22 ENCOUNTER — Telehealth: Payer: Self-pay | Admitting: Internal Medicine

## 2016-03-22 NOTE — Telephone Encounter (Signed)
She should be seen today or tomorrow at the latest.  Ether at a different office or with someone tomorrow here

## 2016-03-22 NOTE — Telephone Encounter (Signed)
Pt is scheduled with Joycelyn Schmid, NP on 03/23/16

## 2016-03-22 NOTE — Telephone Encounter (Signed)
Went to team health nurse line.Marland Kitchen

## 2016-03-22 NOTE — Telephone Encounter (Signed)
Patient Name: MARYN MOHER  DOB: [birth year]    Initial Comment Caller states she has been having weakness, bp had been low but latest146/56   Nurse Assessment  Nurse: Leilani Merl, RN, Heather Date/Time (Eastern Time): 03/22/2016 11:50:28 AM  Confirm and document reason for call. If symptomatic, describe symptoms. You must click the next button to save text entered. ---Caller states she has been having weakness, bp had been low but latest 146/56, she started feeling weak last week.  Has the patient traveled out of the country within the last 30 days? ---Not Applicable  Does the patient have any new or worsening symptoms? ---Yes  Will a triage be completed? ---Yes  Related visit to physician within the last 2 weeks? ---No  Does the PT have any chronic conditions? (i.e. diabetes, asthma, etc.) ---Yes  List chronic conditions. ---See MR  Is this a behavioral health or substance abuse call? ---No     Guidelines    Guideline Title Affirmed Question Affirmed Notes  Weakness (Generalized) and Fatigue [1] MODERATE weakness (i.e., interferes with work, school, normal activities) AND [2] cause unknown (Exceptions: weakness with acute minor illness, or weakness from poor fluid intake)    Final Disposition User   See Physician within 4 Hours (or PCP triage) Standifer, RN, Nira Conn    Comments  No appts available at the office, caller wants to be worked in today.   Referrals  REFERRED TO PCP OFFICE   Disagree/Comply: Comply

## 2016-03-22 NOTE — Telephone Encounter (Signed)
Can you please see if this patient has been scheduled and if not, will you please schedule her?

## 2016-03-23 ENCOUNTER — Encounter: Payer: Self-pay | Admitting: Family

## 2016-03-23 ENCOUNTER — Ambulatory Visit (INDEPENDENT_AMBULATORY_CARE_PROVIDER_SITE_OTHER): Payer: Medicare Other | Admitting: Family

## 2016-03-23 ENCOUNTER — Other Ambulatory Visit: Payer: Medicare Other

## 2016-03-23 VITALS — BP 136/56 | HR 77 | Temp 97.9°F | Ht 61.0 in | Wt 149.2 lb

## 2016-03-23 DIAGNOSIS — R350 Frequency of micturition: Secondary | ICD-10-CM | POA: Diagnosis not present

## 2016-03-23 DIAGNOSIS — R5381 Other malaise: Secondary | ICD-10-CM

## 2016-03-23 DIAGNOSIS — I10 Essential (primary) hypertension: Secondary | ICD-10-CM | POA: Diagnosis not present

## 2016-03-23 LAB — POC URINALSYSI DIPSTICK (AUTOMATED)
BILIRUBIN UA: NEGATIVE
Blood, UA: NEGATIVE
Glucose, UA: NEGATIVE
Ketones, UA: NEGATIVE
NITRITE UA: NEGATIVE
PH UA: 6.5
PROTEIN UA: NEGATIVE
Spec Grav, UA: 1.015
Urobilinogen, UA: 0.2

## 2016-03-23 MED ORDER — NITROFURANTOIN MONOHYD MACRO 100 MG PO CAPS: 100.0000 mg | ORAL_CAPSULE | Freq: Two times a day (BID) | ORAL | Status: DC

## 2016-03-23 NOTE — Progress Notes (Signed)
Pre visit review using our clinic review tool, if applicable. No additional management support is needed unless otherwise documented below in the visit note. 

## 2016-03-23 NOTE — Patient Instructions (Addendum)
Referral to physical therapy to work on strength.  Blood Pressure looks great.  If there is no improvement in your symptoms, or if there is any worsening of symptoms, or if you have any additional concerns, please return for re-evaluation; or, if we are closed, consider going to the Emergency Room for evaluation if symptoms urgent.   Managing Your High Blood Pressure Blood pressure is a measurement of how forceful your blood is pressing against the walls of the arteries. Arteries are muscular tubes within the circulatory system. Blood pressure does not stay the same. Blood pressure rises when you are active, excited, or nervous; and it lowers during sleep and relaxation. If the numbers measuring your blood pressure stay above normal most of the time, you are at risk for health problems. High blood pressure (hypertension) is a long-term (chronic) condition in which blood pressure is elevated. A blood pressure reading is recorded as two numbers, such as 120 over 80 (or 120/80). The first, higher number is called the systolic pressure. It is a measure of the pressure in your arteries as the heart beats. The second, lower number is called the diastolic pressure. It is a measure of the pressure in your arteries as the heart relaxes between beats.  Keeping your blood pressure in a normal range is important to your overall health and prevention of health problems, such as heart disease and stroke. When your blood pressure is uncontrolled, your heart has to work harder than normal. High blood pressure is a very common condition in adults because blood pressure tends to rise with age. Men and women are equally likely to have hypertension but at different times in life. Before age 12, men are more likely to have hypertension. After 80 years of age, women are more likely to have it. Hypertension is especially common in African Americans. This condition often has no signs or symptoms. The cause of the condition is  usually not known. Your caregiver can help you come up with a plan to keep your blood pressure in a normal, healthy range. BLOOD PRESSURE STAGES Blood pressure is classified into four stages: normal, prehypertension, stage 1, and stage 2. Your blood pressure reading will be used to determine what type of treatment, if any, is necessary. Appropriate treatment options are tied to these four stages:  Normal  Systolic pressure (mm Hg): below 120.  Diastolic pressure (mm Hg): below 80. Prehypertension  Systolic pressure (mm Hg): 120 to 139.  Diastolic pressure (mm Hg): 80 to 89. Stage1  Systolic pressure (mm Hg): 140 to 159.  Diastolic pressure (mm Hg): 90 to 99. Stage2  Systolic pressure (mm Hg): 160 or above.  Diastolic pressure (mm Hg): 100 or above. RISKS RELATED TO HIGH BLOOD PRESSURE Managing your blood pressure is an important responsibility. Uncontrolled high blood pressure can lead to:  A heart attack.  A stroke.  A weakened blood vessel (aneurysm).  Heart failure.  Kidney damage.  Eye damage.  Metabolic syndrome.  Memory and concentration problems. HOW TO MANAGE YOUR BLOOD PRESSURE Blood pressure can be managed effectively with lifestyle changes and medicines (if needed). Your caregiver will help you come up with a plan to bring your blood pressure within a normal range. Your plan should include the following: Education  Read all information provided by your caregivers about how to control blood pressure.  Educate yourself on the latest guidelines and treatment recommendations. New research is always being done to further define the risks and treatments for high blood  pressure. Lifestylechanges  Control your weight.  Avoid smoking.  Stay physically active.  Reduce the amount of salt in your diet.  Reduce stress.  Control any chronic conditions, such as high cholesterol or diabetes.  Reduce your alcohol intake. Medicines  Several medicines  (antihypertensive medicines) are available, if needed, to bring blood pressure within a normal range. Communication  Review all the medicines you take with your caregiver because there may be side effects or interactions.  Talk with your caregiver about your diet, exercise habits, and other lifestyle factors that may be contributing to high blood pressure.  See your caregiver regularly. Your caregiver can help you create and adjust your plan for managing high blood pressure. RECOMMENDATIONS FOR TREATMENT AND FOLLOW-UP  The following recommendations are based on current guidelines for managing high blood pressure in nonpregnant adults. Use these recommendations to identify the proper follow-up period or treatment option based on your blood pressure reading. You can discuss these options with your caregiver.  Systolic pressure of 123456 to [id number] or diastolic pressure of 80 to 89: Follow up with your caregiver as directed.  Systolic pressure of [id number] to 0000000 or diastolic pressure of 90 to 100: Follow up with your caregiver within 2 months.  Systolic pressure above 0000000 or diastolic pressure above [id number]: Follow up with your caregiver within 1 month.  Systolic pressure above [id number] or diastolic pressure above [id number]: Consider antihypertensive therapy; follow up with your caregiver within 1 week.  Systolic pressure above [id number] or diastolic pressure above 123456: Begin antihypertensive therapy; follow up with your caregiver within 1 week.   This information is not intended to replace advice given to you by your health care provider. Make sure you discuss any questions you have with your health care provider.   Document Released: 07/17/2012 Document Reviewed: 07/17/2012 Elsevier Interactive Patient Education [employer].

## 2016-03-23 NOTE — Progress Notes (Signed)
Subjective:    Patient ID: [name], female    DOB: [birth year], 82 y.o.   MRN: [id number]   [name] is a 82 y.o. female who presents today for an acute visit.    HPI Comments: Patient here for evaluation of multiple complaints. She worries about her blood pressure. She states that it waxes and wanes with home blood pressure cuff. Denies exertional chest pain or pressure, numbness or tingling radiating to left arm or jaw, palpitations, dizziness, frequent headaches, changes in vision, or shortness of breath. No swelling in legs, or change in weight.   She also complains of urinary frequency which is been ongoing chronically. She is typically unable to tell when she has a UTI.  Patient also complains of general weakness. No falls. Doesn't exercise. No passing out. Has been hospitalized 3 times in past 3 years with rehab. Felt better after PT, OT.                                                                                                                                    Notes feeling sad due to chronic illnesses. Has had depression since husband was sick. On Zoloft for years which helps. Denies changes in appetite, sleep, concentration, weight.Patient also denies negative thoughts of hurting oneself or another person.   Wearing O2. Follows with pulmonology for fibrosis.    Dysuria  This is a new problem. The current episode started 1 to 4 weeks ago. The problem has been unchanged. The quality of the pain is described as burning. The patient is experiencing no pain. There has been no fever. She is not sexually active. There is no history of pyelonephritis. Associated symptoms include frequency. Pertinent negatives include no chills, discharge, hematuria, hesitancy, nausea or vomiting. She has tried nothing for the symptoms. There is no history of kidney stones, recurrent UTIs, urinary stasis or a urological procedure.   Past Medical History  Diagnosis Date  . Dyspnea   .  Diverticulosis 2008  . Internal hemorrhoids   . Esophageal stricture   . Arthritis   . Breast cancer ([place]) 1969  . GERD (gastroesophageal reflux disease)   . Gallstones   . Depression   . HLD (hyperlipidemia)   . HTN (hypertension)   . Obesity   . Colon polyp 2013    TUBULAR ADENOMA (X1  . Pulmonary fibrosis ([place])   . CAD (coronary artery disease)   . Chronic diastolic CHF (congestive heart failure) ([place]) 08/01/2015   Allergies: Anesthetics, amide; Atorvastatin; Codeine; Darvocet; Valsartan; Antihistamines, loratadine-type; and Moxifloxacin Current Outpatient Prescriptions on File Prior to Visit  Medication Sig Dispense Refill  . acetaminophen (TYLENOL) 650 MG CR tablet Take 1,300 mg by mouth every 8 (eight) hours as needed for pain.    Marland Kitchen albuterol (PROVENTIL HFA;VENTOLIN HFA) 108 (90 BASE) MCG/ACT inhaler Inhale 2 puffs into the lungs every 4 (four) hours as needed for wheezing  or shortness of breath.    Marland Kitchen aspirin EC 325 MG EC tablet Take 1 tablet (325 mg total) by mouth daily. 30 tablet 0  . Cholecalciferol (VITAMIN D) 2000 UNITS tablet Take 2,000 Units by mouth at bedtime.    Marland Kitchen esomeprazole (NEXIUM) 20 MG capsule Take 40 mg by mouth every morning.     . fluticasone (FLONASE) 50 MCG/ACT nasal spray Place 2 sprays into both nostrils 2 (two) times daily as needed for allergies or rhinitis.     . furosemide (LASIX) 40 MG tablet Take 1 tablet (40 mg total) by mouth every morning. (Patient taking differently: Take 20 mg by mouth every morning. ) 30 tablet 5  . ipratropium-albuterol (DUONEB) 0.5-2.5 (3) MG/3ML SOLN Take 3 mLs by nebulization every 6 (six) hours as needed. 360 mL 0  . Magnesium 250 MG TABS Take 250 mg by mouth at bedtime.     Marland Kitchen MELATONIN PO Take 1 tablet by mouth at bedtime.    . OXYGEN Oxygen 3lpm 24/7 DME- [facility]    . polyethylene glycol (MIRALAX / GLYCOLAX) packet Take 17 g by mouth daily as needed. 14 each 0  . potassium chloride SA (K-DUR,KLOR-CON) 20  MEQ tablet Take 1 tablet (20 mEq total) by mouth daily. 30 tablet 5  . predniSONE (DELTASONE) 10 MG tablet 20 mg daily 60 tablet 1  . predniSONE (DELTASONE) 20 MG tablet Take 1 tablet (20 mg total) by mouth daily with breakfast. 100 tablet 3  . senna-docusate (SENOKOT-S) 8.6-50 MG tablet Take 1 tablet by mouth at bedtime as needed for mild constipation.    . sertraline (ZOLOFT) 100 MG tablet TAKE 1 TABLET BY MOUTH EVERY DAY 30 tablet 5  . sodium chloride (OCEAN) 0.65 % SOLN nasal spray Place 1 spray into both nostrils 2 (two) times daily. (Patient taking differently: Place 1 spray into both nostrils daily as needed for congestion. )  0  . benzonatate (TESSALON) 200 MG capsule Take 1 capsule (200 mg total) by mouth 3 (three) times daily as needed for cough. (Patient not taking: Reported on 03/23/2016) 20 capsule 0   No current facility-administered medications on file prior to visit.    Social History  Substance Use Topics  . Smoking status: Never Smoker   . Smokeless tobacco: Never Used  . Alcohol Use: 4.2 oz/week    7 Glasses of wine per week     Comment: ocaasional    Review of Systems  Constitutional: Negative for fever and chills.  Respiratory: Negative for cough.   Cardiovascular: Negative for chest pain, palpitations and leg swelling.  Gastrointestinal: Negative for nausea and vomiting.  Genitourinary: Positive for frequency. Negative for dysuria, hesitancy and hematuria.  Neurological: Positive for weakness (general). Negative for dizziness, syncope and headaches.      Objective:    BP 136/56 mmHg  Pulse 77  Temp(Src) 97.9 F (36.6 C) (Oral)  Ht 5\' 1"  (1.549 m)  Wt 149 lb 4 oz (67.699 kg)  BMI 28.21 kg/m2  SpO2 96%   Physical Exam  Constitutional: She appears well-developed and well-nourished.  Eyes: Conjunctivae are normal.  Cardiovascular: Normal rate, regular rhythm, normal heart sounds and normal pulses.   Pulmonary/Chest: Effort normal and breath sounds normal.  She has no wheezes. She has no rhonchi. She has no rales.  Abdominal: There is no CVA tenderness.  Neurological: She is alert. She has normal strength. Coordination and gait normal.  Grip equal and strong bilateral upper extremities. Gait strong and  steady.  Skin: Skin is warm and dry.  Psychiatric: She has a normal mood and affect. Her speech is normal and behavior is normal. Thought content normal.  Vitals reviewed.      Assessment & Plan:   1. Urinary frequency UA showed trace leukocytes. Patient preference was to go ahead and start antibiotic while pending culture. -UA -urine culture  2. Essential hypertension Attentive. Patient has no signs or symptoms of hypertensive emergency or urgency at this time. He assured her that her blood pressure cuffs are sometimes an accurate and that she is welcome to come to our office any time so we can compare readings.Last weight was 147, 149 today. No concern of CHF exacerbation.   3. Physical deconditioning Working diagnosis of patient's complaint of weakness is physical deconditioning. Patient reports lack of physical activity as well as chronic depression which I suspect exacerbates desire, motivation to be physically active. Patient is benefited from physical therapy and occupational therapy in the past. We discussed this today and she is willing to try physical therapy again to help her with her overall strength. Reassured by normal strength and coordination on exam.  - Ambulatory referral to Physical Therapy    I have discontinued Ms. Mcdermott's traMADol. I am also having her maintain her fluticasone, aspirin, sodium chloride, esomeprazole, ipratropium-albuterol, Magnesium, MELATONIN PO, benzonatate, Vitamin D, acetaminophen, polyethylene glycol, senna-docusate, albuterol, OXYGEN, potassium chloride SA, furosemide, predniSONE, sertraline, and predniSONE.   No orders of the defined types were placed in this encounter.     Start medications  as prescribed and explained to patient on After Visit Summary ( AVS). Risks, benefits, and alternatives of the medications and treatment plan prescribed today were discussed, and patient expressed understanding.   Education regarding symptom management and diagnosis given to patient.   Follow-up:Plan follow-up as discussed or as needed if any worsening symptoms or change in condition.   Continue to follow with Binnie Rail, MD for routine health maintenance.   [name] and I agreed with plan.   Mable Paris, FNP

## 2016-03-23 NOTE — Addendum Note (Signed)
Addended by: Aviva Signs M on: 03/23/2016 11:29 AM   Modules accepted: Orders, SmartSet

## 2016-03-27 LAB — CULTURE, URINE COMPREHENSIVE

## 2016-03-28 DIAGNOSIS — H25811 Combined forms of age-related cataract, right eye: Secondary | ICD-10-CM | POA: Diagnosis not present

## 2016-03-28 DIAGNOSIS — H2511 Age-related nuclear cataract, right eye: Secondary | ICD-10-CM | POA: Diagnosis not present

## 2016-04-04 ENCOUNTER — Telehealth: Payer: Self-pay

## 2016-04-04 NOTE — Telephone Encounter (Signed)
Please advise 

## 2016-04-04 NOTE — Telephone Encounter (Signed)
ok 

## 2016-04-04 NOTE — Telephone Encounter (Signed)
Need verbal Orders for home base palliative [facility]. Please follow up with Manus Gunning. Thank you.

## 2016-04-04 NOTE — Telephone Encounter (Signed)
Spoke with Manus Gunning at [facility] to give [employer]

## 2016-04-19 ENCOUNTER — Telehealth: Payer: Self-pay | Admitting: Family

## 2016-04-19 ENCOUNTER — Other Ambulatory Visit: Payer: Self-pay | Admitting: Family

## 2016-04-19 DIAGNOSIS — R5381 Other malaise: Secondary | ICD-10-CM

## 2016-04-19 DIAGNOSIS — R531 Weakness: Secondary | ICD-10-CM

## 2016-04-19 DIAGNOSIS — J9611 Chronic respiratory failure with hypoxia: Secondary | ICD-10-CM

## 2016-04-19 NOTE — Telephone Encounter (Signed)
Please call patient and advise her to make a follow-up appointment with Dr. Quay Burow her PCP. Referral coordinator her let me know that she is unable to do physical therapy since she is unable to leave the House. The patient with still like to pursue home health or someone coming into her house, she would need appointment with her PCP

## 2016-04-20 DIAGNOSIS — R5381 Other malaise: Secondary | ICD-10-CM | POA: Insufficient documentation

## 2016-04-20 NOTE — Telephone Encounter (Signed)
NP saw pt recently. Pt is in need of HH PT but NP is not able to ordre for pt. Please review if HH is appropriate for patient and if a visit is needed we can schedule that.  Please let me know how you would like to proceed.

## 2016-04-20 NOTE — Telephone Encounter (Signed)
I ordered home health.  If she does not have to come in that is probably easiest.  I agree with home PT.

## 2016-04-26 DIAGNOSIS — I5032 Chronic diastolic (congestive) heart failure: Secondary | ICD-10-CM | POA: Diagnosis not present

## 2016-04-26 DIAGNOSIS — F329 Major depressive disorder, single episode, unspecified: Secondary | ICD-10-CM | POA: Diagnosis not present

## 2016-04-26 DIAGNOSIS — Z7982 Long term (current) use of aspirin: Secondary | ICD-10-CM | POA: Diagnosis not present

## 2016-04-26 DIAGNOSIS — Z87440 Personal history of urinary (tract) infections: Secondary | ICD-10-CM | POA: Diagnosis not present

## 2016-04-26 DIAGNOSIS — E785 Hyperlipidemia, unspecified: Secondary | ICD-10-CM | POA: Diagnosis not present

## 2016-04-26 DIAGNOSIS — I110 Hypertensive heart disease with heart failure: Secondary | ICD-10-CM | POA: Diagnosis not present

## 2016-04-26 DIAGNOSIS — K219 Gastro-esophageal reflux disease without esophagitis: Secondary | ICD-10-CM | POA: Diagnosis not present

## 2016-04-26 DIAGNOSIS — Z853 Personal history of malignant neoplasm of breast: Secondary | ICD-10-CM | POA: Diagnosis not present

## 2016-04-26 DIAGNOSIS — Z86010 Personal history of colonic polyps: Secondary | ICD-10-CM | POA: Diagnosis not present

## 2016-04-26 DIAGNOSIS — R2689 Other abnormalities of gait and mobility: Secondary | ICD-10-CM | POA: Diagnosis not present

## 2016-04-26 DIAGNOSIS — M1990 Unspecified osteoarthritis, unspecified site: Secondary | ICD-10-CM | POA: Diagnosis not present

## 2016-05-03 ENCOUNTER — Telehealth: Payer: Self-pay | Admitting: Internal Medicine

## 2016-05-03 NOTE — Telephone Encounter (Signed)
ok 

## 2016-05-03 NOTE — Telephone Encounter (Signed)
[facility] [phone]  Need verbals for PT for home exercise and balance training   2 week 1 4 week 4

## 2016-05-03 NOTE — Telephone Encounter (Signed)
Verbal okay given for the below.

## 2016-05-26 ENCOUNTER — Other Ambulatory Visit (INDEPENDENT_AMBULATORY_CARE_PROVIDER_SITE_OTHER): Payer: Medicare Other

## 2016-05-26 ENCOUNTER — Ambulatory Visit (INDEPENDENT_AMBULATORY_CARE_PROVIDER_SITE_OTHER): Payer: Medicare Other | Admitting: Internal Medicine

## 2016-05-26 ENCOUNTER — Encounter: Payer: Self-pay | Admitting: Internal Medicine

## 2016-05-26 VITALS — BP 108/66 | HR 82 | Ht 61.0 in | Wt 150.0 lb

## 2016-05-26 DIAGNOSIS — R05 Cough: Secondary | ICD-10-CM

## 2016-05-26 DIAGNOSIS — J8410 Pulmonary fibrosis, unspecified: Secondary | ICD-10-CM | POA: Diagnosis not present

## 2016-05-26 DIAGNOSIS — J9611 Chronic respiratory failure with hypoxia: Secondary | ICD-10-CM | POA: Diagnosis not present

## 2016-05-26 DIAGNOSIS — R059 Cough, unspecified: Secondary | ICD-10-CM

## 2016-05-26 LAB — CBC WITH DIFFERENTIAL/PLATELET
BASOS ABS: 0 10*3/uL (ref 0.0–0.1)
Basophils Relative: 0 % (ref 0.0–3.0)
EOS ABS: 0 10*3/uL (ref 0.0–0.7)
Eosinophils Relative: 0.1 % (ref 0.0–5.0)
HCT: 40.3 % (ref 36.0–46.0)
Hemoglobin: 13 g/dL (ref 12.0–15.0)
LYMPHS ABS: 1 10*3/uL (ref 0.7–4.0)
LYMPHS PCT: 7.8 % — AB (ref 12.0–46.0)
MCHC: 32.2 g/dL (ref 30.0–36.0)
MCV: 83.2 fl (ref 78.0–100.0)
MONOS PCT: 1.7 % — AB (ref 3.0–12.0)
Monocytes Absolute: 0.2 10*3/uL (ref 0.1–1.0)
Neutro Abs: 11.6 10*3/uL — ABNORMAL HIGH (ref 1.4–7.7)
Platelets: 175 10*3/uL (ref 150.0–400.0)
RBC: 4.85 Mil/uL (ref 3.87–5.11)
RDW: 18 % — ABNORMAL HIGH (ref 11.5–15.5)
WBC: 12.9 10*3/uL — ABNORMAL HIGH (ref 4.0–10.5)

## 2016-05-26 LAB — SEDIMENTATION RATE: Sed Rate: 35 mm/hr — ABNORMAL HIGH (ref 0–30)

## 2016-05-26 MED ORDER — FAMOTIDINE 20 MG PO TABS: ORAL_TABLET | ORAL | Status: AC

## 2016-05-26 NOTE — Progress Notes (Addendum)
60 yowf never smoker referred by Dr Brigitte Pulse for eval of new onset sob early 2012 with clear evidence of pulmonary fibrosis ? Related to connective tissue dz 08/2014    History of Present Illness  November 28, 2010 1st pulmonary office eval for new onset doe x across the room, abupt onset right after NY's, assoc with increase cough getting better and chest tightness assoc also with nasal congestion.. sleeping better most of the nights without noct exac or early am excess mucus production.  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 Connective tissue dz related  Chief Complaint  Patient presents with  . HFU    Pt states that her breathing has improved, but not back to baseline. No new co's today.   improved to point where could do HT on day of ov on RA Prednisone 2 with breakfast rec Nexium should be 40 mg daily automatically  Take 30-60 min before first meal of the day and pepcid ac 20 mg at  Bedtime until you return  Reduce prednisone to 20 mg daily with bfast  GERD diet   09/28/2014 f/u ov/Ayan Heffington re: PF/ assoc with arthritis/Pos RA/on prednisone new rx 08/24/14   Chief Complaint  Patient presents with  . Follow-up    Pt states that her breathing continues to improve, but not yet back to normal baseline.    can do HT leaning on basket / uses [employer] parking / level into house and very sob if  carries groceries in vs better   a year ago=  clean house , walk around block, no HC parking  About 50% back to baseline since  on prednisone  Previously reports short courses of pred helped arthritic symptoms a lot but never on it more than a few weeks and is concerned about staying on it. rec Reduce prednisone to 10 mg each am    10/20/2014 f/u ov/Seraj Dunnam re: pred 10 mg daily for PF related to ? RA  Chief Complaint  Patient presents with  . Acute Visit    Pt c/o weakness, dizziness and SOB for the past 6 days. She is also c/o prod cough with thick, clear sputum.    Not really sure breathing is worse so much as her fatigue is and this correlates best with resolution of chronic leg swelling while on lasix, her doe and arthritis may are really not changed on 20 vs 10 of prednisone.  rec No change prednisone pending labs but hold the lasix until swelling gets noticeable and take it  As needed    11/09/2014 f/u ov/Lynora Dymond re: pf/ ? Assoc connective tissue dz  On 10 mg pred daily  Chief Complaint  Patient presents with  . Follow-up    Pt states that she is unchanged. She feels like she is having some increased nasal and chest congestion, has to clear her throat alot.     rec Try prednisone 10  mg one half  daily at breakfast Continue nexium 40 mg Take 30-60 min before first meal of the day and Pepcid 20 mg at supper  Please remember to go to the x-ray department downstairs for your tests - we will call you with the results when they are available. Please schedule a follow up office visit in 6 weeks, call sooner if needed - if worse increase prednisone to 10 mg daily       12/30/2014 f/u ov/Ravinder Hofland re: sob Chief Complaint  Patient presents with  . Follow-up    Pt states that her congestion seems to be better. Her breathing is overall doing well. No new co's today.    Decreased lasix by half, increased prednisone to 10 mg daily after back pain / hands pain increased on the lower dose but not  sob/cough at lower dose rec Ok to maintain predisone at 10 mg per day since this dose helps arthritis and therefore probably more than adequate to cover lung inflammation I am deferring whether to refer you to rheumatology to Dr Shaw's capable hands   03/31/2015 f/u ov/Mylan Lengyel re: connective tissue dz/ PF   Chief Complaint  Patient presents with  . Follow-up    Pt states her breathing is unchanged since the last visit. No new co's today.    can do any shopping she wants as long as has a cart and hc parking and no cough or joint aches at 10 mg daily - really  Not limited by breathing from desired activities   rec No change rx = 10 mg pred daily    07/01/2015 f/u ov/Jaylen Knope re: aches/ ild that appear pred responsive Chief Complaint  Patient presents with  . Follow-up    Pt states her breathing is about the same "maybe some worse".     On 10 mg daily thru 8/21 no aches and no change in doe so reduced dose on her own to 5 mg qod. Min dry daytime coughing                  rec Add pepcid ac 20 mg at bedtime and For drainage / throat tickle try take CHLORPHENIRAMINE  4 mg - take one every 4 hours as needed - available over the counter- may cause drowsiness so start with just a bedtime dose or two and see how you tolerate it before trying in daytime   Please remember to go to the   x-ray department downstairs for your tests - we will call you with the results when they are available. The fact you are so prednisone dependent strongly suggests you have a rheumatologic condition and may benefit from consultation with a  Rheumatologist No blood work today but I would add at least an ESR to the studies you will get tomorrow with Dr Shaw to evaluate your fatigue  Leave prednisone at 10 mg per day for now with option to increase to 20 mg if condition worsens  Please see patient coordinator before you leave today  to schedule :  Wear 02 2lpm only when walking     08/12/2015  Post hosp f/u ov/Edrees Valent re:  steroid resp ild/ ? Underlying connective tissue dz/ resp failure  Chief Complaint  Patient presents with  . Hospitalization Follow-up    Post-inflammatory PF. Pt states she has been doing well on home O2/2L, wearing it 24h/day. Pt c/o of mostly dry cough, sometimes wet in the mornings. Pt also c/o of wheeze/SOB with exertion. Pt does   not use albuterol inhaler.   Presently on 40 mg of prednisone daily  Concentrator is set at 2lpm bedtime Portable system is full but unable to turn on Walked in off 02 / no sob x room to room.  rec Prednisone 30 mg daily x one week, 20 mg daily x one week, then 15 mg daily x 2 weeks then 10 mg is your new floor dose but ok to double if start to worsen. 02 instructions:  No need for 02 sitting or walking across the room but definitely wear 2lpm when walking outside your home and when get where you are going and sit down you can take it back off to save oxygen - also wear it 2lpm at bedtime automatically  [facility] is responsible for maintaining your equipment and explaining how it works but if there is any issue with them call [facility] at [phone] with the name of the person that's not helping you and we'll call them directly       11/26/2015  f/u ov/Liandra Mendia re: PF/ steroid dep component @ 20 mg Chief Complaint  Patient presents with  . Follow-up    Pt states that her breathing is overall doing well. She has been more active lately.   monitoring her owns sats/ usually requiring 3lpm 24/7 but able to walk more now/ arthritis symptoms esp back better also at 20 mg per day  rec A Rheumatologist might be able to come up with an alternative to prednisone but for now leave at 20 mg daily  Goal for 02 is to keep over 90% Please see patient coordinator before you leave today  to schedule BEST FIT evaluation for portable 02       02/25/2016  f/u ov/Shakiyah Cirilo re: PF/ steroid responsive now at 20 mg maint/ 3-4lpm np 24/7  Chief Complaint  Patient presents with  .  Follow-up    Pt c/o increased fatigue since had cataract surgery a wk ago and pred was increased and then weened back to 20 mg.   arthritis completely resolved/ plans bone density next week/ not needing saba at all. rec If breathing /cough /arthritis worse > double prednisone until better then decrease to 20 mg daily    05/26/2016  f/u ov/Keelia Graybill re: PF/ steroid responsive component on 3lpm rest/sleep and 4lpm with activity  On pred 20 mg daily / ppi q am no h2hs Chief Complaint  Patient presents with  . Follow-up    Breathing has been doing well. She states that she tired very easily. She has had increased cough with thick, yellow to clear sputum x 10 days.   sats drop into mid 80s sometimes walking slow pace flat grade even on 4lpm  Cough worse in am prior to taking her PPI / min productive / min discolored and not with exertion or deep breath   No obvious day to day or daytime variability or assoc cp or chest tightness,  or overt sinus or hb symptoms. No unusual exp hx or h/o childhood pna/ asthma or knowledge of premature birth.  Sleeping ok without nocturnal  or early am exacerbation  of respiratory  c/o's or need for noct saba. Also denies any obvious fluctuation of symptoms with weather or environmental changes or other aggravating or alleviating factors except as outlined above   Current Medications, Allergies, Complete Past Medical History, Past Surgical History, Family History, and Social History were reviewed in [employer] record.  ROS  The following are not active  complaints unless bolded sore throat, dysphagia, dental problems, itching, sneezing,  nasal congestion or excess/ purulent secretions, ear ache,   fever, chills, sweats, unintended wt loss, classically pleuritic or exertional cp, hemoptysis,  orthopnea pnd or leg swelling, presyncope, palpitations, abdominal pain, anorexia, nausea, vomiting, diarrhea  or change in bowel or bladder habits, change in  stools or urine, dysuria,hematuria,  rash, arthralgias  , visual complaints, headache, numbness, weakness or ataxia or problems with walking or coordination,  change in mood/affect or memory.        Pex  amb wf nad/ vital signs reviewed, note sats 95% on 3lpm on arrival  09/28/2014    160  Vs 10/20/2014  160 > 11/09/2014 162 > 12/30/2014  160  > 03/31/2015 161 > 07/01/2015  162 > 10/15/2015   144 >  02/25/2016  147  > 05/26/2016   150     08/31/14 158 lb (71.668 kg)  08/15/14 159 lb 2.8 oz (72.2 kg)  08/07/14 162 lb (73.483 kg)     HEENT: nl dentition, turbinates, and orophanx. Nl external ear canals without cough reflex   NECK :  without JVD/Nodes/TM/ nl carotid upstrokes bilaterally   LUNGS: no acc muscle use,  Bilateral faint  basilar  insp crackles s cough on insp   CV:  RRR  no s3 or murmur or increase in P2,  no edema   ABD:  soft and nontender with nl excursion in the supine position. No bruits or organomegaly, bowel sounds nl  MS:  warm without deformities, calf tenderness, cyanosis or clubbing  SKIN: warm and dry without lesions    NEURO:  alert, approp, no deficits                    Lab Results  Component Value Date   ESRSEDRATE 35 (H) 05/26/2016   ESRSEDRATE 39 (H) 02/25/2016   ESRSEDRATE 57 (H) 01/06/2016

## 2016-05-26 NOTE — Patient Instructions (Addendum)
Please remember to go to the lab department downstairs for your tests - we will call you with the results when they are available.  02 floor is 3 lpm but with  activity   turn it up to maintain over 90% while exerting   Add back pepcid 20mg  at bedtime   Please schedule a follow up visit in 3 months but call sooner if needed

## 2016-05-28 ENCOUNTER — Encounter: Payer: Self-pay | Admitting: Internal Medicine

## 2016-05-28 DIAGNOSIS — R059 Cough, unspecified: Secondary | ICD-10-CM | POA: Insufficient documentation

## 2016-05-28 DIAGNOSIS — R05 Cough: Secondary | ICD-10-CM | POA: Insufficient documentation

## 2016-05-28 NOTE — Assessment & Plan Note (Addendum)
She has an early am cough on present rx .  Note Use of PPI is associated with improved survival time and with decreased radiologic fibrosis per King's study published in AJRCCM vol 184 p1390.  Dec 2011 and also may have other beneficial effects as per the latest review in [place] vol 193 [id number] Jun 20016.  This may not always be cause and effect, but given how universally unimpressive and expensive  all the other  Drugs developed to day  have been for pf,   rec start  rx ppi / h2 hs(to get truly 24 h acid suppression including noct)  diet/ lifestyle modification and f/u with serial walking sats and lung volumes for now to put more points on the curve / establish firm baseline before considering additional measures.

## 2016-05-28 NOTE — Assessment & Plan Note (Signed)
PFT's 03/10/10 FEV1 1.68 (130%) ratio 74 , DLCO 48% (vs 45% 04/22/07) corrects to 60%  See inpt adm 08/15/14  -CT 08/15/14 >> moderate CAD, UIP pattern of pulmonary fibrosis with b/l areas of GGO, BTX RUL  -ESR 08/16/14 >> 67 > started systemic steroids as inpt -Echo 08/16/14 >> mild LVH, EF 60 to 78%, diastolic dysfx  -Labs 67/54/49 >> ANA negative, RF 32, anti-CCP negative, SS-A/SS-B negative, SCL-70 negative  -PFT 08/17/14 >> FEV1 1.75 (115%), FEV1% 84, TLC 3.39 (73%), DLCO 45% - 08/31/14   Walked RA x 3 laps @ 185 ft each stopped due to  92% slow pace @ 40 mg per day> rec reduce to 20 mg daily  - 09/28/2014  Walked RA x 3 laps @ 185 ft each stopped due to  Slow to mod pace, some sob at end but sats 94%  - reduced pred to 10 mg daily 09/28/2014 > worse starting 10/14/14 so recheck esr 10/20/2014  - 10/20/2014  Walked RA x 3 laps @ 185 ft each stopped due to end of study, slow to mod pace, no sob or desats on 10 mg daily pred - 11/09/2014   Trial of prednisone 5 mg daily > flared in terms of arthritis > resumed 10 mg per day since around 12/07/14  - 03/31/2015  Walked RA x 3 laps @ 185 ft each stopped due to  End of study, some sob but no desat at nl pace at 10 mg pred per day  - 07/01/2015  Walked RA x 3 laps @ 185 ft each stopped due to min sob/ nl pace no desat    At a dose of 10 mg daily until 06/27/15 then changed herself to 5 mg qod> rec slow taper back to same floor - 07/28/2015   Walked RA  2 laps @ 185 ft each stopped due to  Fatigue/ nl pace, no desat @ 10 mg daily with ESR 35  - 10/15/2015 reports walking 300 ft at [facility] and stops due to sob with sats 88% on 4lpm with esr 45 on 20 mg pred per day  - 11/26/2015 reports improved walking on 3lpm @ 20 mg /day/ worse at lower doses > no change rx - 01/06/2016   Worse sats on 3/ needing up to 4 and esr up to 57 > rec 20 bid until better then 20 mg daily and Rheum eval - ESR new low of 39 02/25/2016 > rec 20 mg daily, ok to double for flare   The goal with a  chronic steroid dependent illness is always arriving at the lowest effective dose that controls the disease/symptoms and not accepting a set "formula" which is based on statistics or guidelines that don't always take into account patient  variability or the natural hx of the dz in every individual patient, which may well vary over time.  For now therefore I recommend the patient maintain  20 mg floor, 40 mg per day ceiling noting that even on 20 mg per day her ESR is still near 40 and she desats walking sometimes on 4lpm

## 2016-05-28 NOTE — Assessment & Plan Note (Signed)
07/28/2015   Walked RA x one lap @ 185 stopped due to  desat to 86% nl pace 07/28/2015   Walked   2lpm   2 laps @ 185 ft each stopped due to fatigue, no sob or desat > rec 02 with walking only  - 08/12/2015   Walked RA x one lap @ 185 stopped due to  desat to 88%  then 2lpm > walked 2 laps nl pace s sob or desat - 10/15/2015 reports walking 300 ft at [facility] and stops due to sob with sats 88% on 4lpm with esr 45 on 20 mg pred per day - 11/26/2015 reports walking at [facility] on 3lpm  - 01/06/2016   Walked RA x one lap @ 185 stopped due to  Sob / desats to 88% with ESR 57 so rec pred 20 bid until better then back to 20 mg       rec as of 05/26/2016 = 3lpm 24/7 but increase to 4lpm with walking and titrate up if needed to keep over 90% sat

## 2016-05-29 LAB — PATHOLOGIST SMEAR REVIEW

## 2016-05-29 NOTE — Progress Notes (Signed)
ATC, NA and no option to leave msg 

## 2016-05-30 NOTE — Progress Notes (Signed)
Spoke with pt and notified of results per Dr. Wert. Pt verbalized understanding and denied any questions. 

## 2016-06-01 ENCOUNTER — Telehealth: Payer: Self-pay | Admitting: Emergency Medicine

## 2016-06-01 MED ORDER — POTASSIUM CHLORIDE CRYS ER 20 MEQ PO TBCR: 20.0000 meq | EXTENDED_RELEASE_TABLET | Freq: Every day | ORAL | 5 refills | Status: AC

## 2016-06-01 NOTE — Telephone Encounter (Signed)
Pt called and pharmacy said she would have to contact the Dr office before they could refill her potassium chloride SA (K-DUR,KLOR-CON) 20 MEQ tablet. Pharmacy is [facility] on [address]. Pt requested a call back. Please follow up thanks.

## 2016-06-01 NOTE — Telephone Encounter (Signed)
RX sent, Pt notified.

## 2016-06-09 ENCOUNTER — Telehealth: Payer: Self-pay | Admitting: Internal Medicine

## 2016-06-09 NOTE — Telephone Encounter (Signed)
Spoke with Tammy with Hospice  She states that the pt was referred to the Palliative Care/[facility] from [facility]  The medical director reviewed her case and feels she is very close to qualifying for Hospice program  She wants to know what MW thinks Please advise, thanks!

## 2016-06-11 NOTE — Telephone Encounter (Signed)
It's a judgement call and I didn't think so based on her last ov though really have no other suggestions in event she gets worse.

## 2016-06-12 ENCOUNTER — Telehealth: Payer: Self-pay | Admitting: Internal Medicine

## 2016-06-12 NOTE — Telephone Encounter (Signed)
States patient is currently in pallative care and would like to transition over to full hospice services.  Please follow up in regard.

## 2016-06-12 NOTE — Telephone Encounter (Signed)
Spoke with Manus Gunning to give okay for full hospice care.

## 2016-06-12 NOTE — Telephone Encounter (Signed)
Called and spoke with Tammy with Hospice---she is aware of MW recs.

## 2016-06-14 DIAGNOSIS — I2510 Atherosclerotic heart disease of native coronary artery without angina pectoris: Secondary | ICD-10-CM | POA: Diagnosis not present

## 2016-06-14 DIAGNOSIS — I110 Hypertensive heart disease with heart failure: Secondary | ICD-10-CM | POA: Diagnosis not present

## 2016-06-14 DIAGNOSIS — Z853 Personal history of malignant neoplasm of breast: Secondary | ICD-10-CM | POA: Diagnosis not present

## 2016-06-14 DIAGNOSIS — J8410 Pulmonary fibrosis, unspecified: Secondary | ICD-10-CM | POA: Diagnosis not present

## 2016-06-14 DIAGNOSIS — I5032 Chronic diastolic (congestive) heart failure: Secondary | ICD-10-CM | POA: Diagnosis not present

## 2016-06-14 DIAGNOSIS — K219 Gastro-esophageal reflux disease without esophagitis: Secondary | ICD-10-CM | POA: Diagnosis not present

## 2016-06-16 DIAGNOSIS — I2510 Atherosclerotic heart disease of native coronary artery without angina pectoris: Secondary | ICD-10-CM | POA: Diagnosis not present

## 2016-06-16 DIAGNOSIS — Z853 Personal history of malignant neoplasm of breast: Secondary | ICD-10-CM | POA: Diagnosis not present

## 2016-06-16 DIAGNOSIS — I110 Hypertensive heart disease with heart failure: Secondary | ICD-10-CM | POA: Diagnosis not present

## 2016-06-16 DIAGNOSIS — J8410 Pulmonary fibrosis, unspecified: Secondary | ICD-10-CM | POA: Diagnosis not present

## 2016-06-16 DIAGNOSIS — I5032 Chronic diastolic (congestive) heart failure: Secondary | ICD-10-CM | POA: Diagnosis not present

## 2016-06-16 DIAGNOSIS — K219 Gastro-esophageal reflux disease without esophagitis: Secondary | ICD-10-CM | POA: Diagnosis not present

## 2016-06-19 ENCOUNTER — Other Ambulatory Visit: Payer: Self-pay | Admitting: Internal Medicine

## 2016-06-19 DIAGNOSIS — J8410 Pulmonary fibrosis, unspecified: Secondary | ICD-10-CM | POA: Diagnosis not present

## 2016-06-19 DIAGNOSIS — I5032 Chronic diastolic (congestive) heart failure: Secondary | ICD-10-CM | POA: Diagnosis not present

## 2016-06-19 DIAGNOSIS — Z853 Personal history of malignant neoplasm of breast: Secondary | ICD-10-CM | POA: Diagnosis not present

## 2016-06-19 DIAGNOSIS — K219 Gastro-esophageal reflux disease without esophagitis: Secondary | ICD-10-CM | POA: Diagnosis not present

## 2016-06-19 DIAGNOSIS — I2510 Atherosclerotic heart disease of native coronary artery without angina pectoris: Secondary | ICD-10-CM | POA: Diagnosis not present

## 2016-06-19 DIAGNOSIS — I110 Hypertensive heart disease with heart failure: Secondary | ICD-10-CM | POA: Diagnosis not present

## 2016-06-20 DIAGNOSIS — K219 Gastro-esophageal reflux disease without esophagitis: Secondary | ICD-10-CM | POA: Diagnosis not present

## 2016-06-20 DIAGNOSIS — I5032 Chronic diastolic (congestive) heart failure: Secondary | ICD-10-CM | POA: Diagnosis not present

## 2016-06-20 DIAGNOSIS — Z853 Personal history of malignant neoplasm of breast: Secondary | ICD-10-CM | POA: Diagnosis not present

## 2016-06-20 DIAGNOSIS — J8410 Pulmonary fibrosis, unspecified: Secondary | ICD-10-CM | POA: Diagnosis not present

## 2016-06-20 DIAGNOSIS — I110 Hypertensive heart disease with heart failure: Secondary | ICD-10-CM | POA: Diagnosis not present

## 2016-06-20 DIAGNOSIS — I2510 Atherosclerotic heart disease of native coronary artery without angina pectoris: Secondary | ICD-10-CM | POA: Diagnosis not present

## 2016-06-28 DIAGNOSIS — I2510 Atherosclerotic heart disease of native coronary artery without angina pectoris: Secondary | ICD-10-CM | POA: Diagnosis not present

## 2016-06-28 DIAGNOSIS — Z853 Personal history of malignant neoplasm of breast: Secondary | ICD-10-CM | POA: Diagnosis not present

## 2016-06-28 DIAGNOSIS — J8410 Pulmonary fibrosis, unspecified: Secondary | ICD-10-CM | POA: Diagnosis not present

## 2016-06-28 DIAGNOSIS — I5032 Chronic diastolic (congestive) heart failure: Secondary | ICD-10-CM | POA: Diagnosis not present

## 2016-06-28 DIAGNOSIS — K219 Gastro-esophageal reflux disease without esophagitis: Secondary | ICD-10-CM | POA: Diagnosis not present

## 2016-06-28 DIAGNOSIS — I110 Hypertensive heart disease with heart failure: Secondary | ICD-10-CM | POA: Diagnosis not present

## 2016-07-03 ENCOUNTER — Other Ambulatory Visit: Payer: Self-pay | Admitting: Internal Medicine

## 2016-07-03 DIAGNOSIS — I2510 Atherosclerotic heart disease of native coronary artery without angina pectoris: Secondary | ICD-10-CM | POA: Diagnosis not present

## 2016-07-03 DIAGNOSIS — I5032 Chronic diastolic (congestive) heart failure: Secondary | ICD-10-CM | POA: Diagnosis not present

## 2016-07-03 DIAGNOSIS — I110 Hypertensive heart disease with heart failure: Secondary | ICD-10-CM | POA: Diagnosis not present

## 2016-07-03 DIAGNOSIS — Z853 Personal history of malignant neoplasm of breast: Secondary | ICD-10-CM | POA: Diagnosis not present

## 2016-07-03 DIAGNOSIS — J8410 Pulmonary fibrosis, unspecified: Secondary | ICD-10-CM | POA: Diagnosis not present

## 2016-07-03 DIAGNOSIS — K219 Gastro-esophageal reflux disease without esophagitis: Secondary | ICD-10-CM | POA: Diagnosis not present

## 2016-07-04 DIAGNOSIS — K219 Gastro-esophageal reflux disease without esophagitis: Secondary | ICD-10-CM | POA: Diagnosis not present

## 2016-07-04 DIAGNOSIS — I5032 Chronic diastolic (congestive) heart failure: Secondary | ICD-10-CM | POA: Diagnosis not present

## 2016-07-04 DIAGNOSIS — Z853 Personal history of malignant neoplasm of breast: Secondary | ICD-10-CM | POA: Diagnosis not present

## 2016-07-04 DIAGNOSIS — I2510 Atherosclerotic heart disease of native coronary artery without angina pectoris: Secondary | ICD-10-CM | POA: Diagnosis not present

## 2016-07-04 DIAGNOSIS — J8410 Pulmonary fibrosis, unspecified: Secondary | ICD-10-CM | POA: Diagnosis not present

## 2016-07-04 DIAGNOSIS — I110 Hypertensive heart disease with heart failure: Secondary | ICD-10-CM | POA: Diagnosis not present

## 2016-07-07 DIAGNOSIS — I5032 Chronic diastolic (congestive) heart failure: Secondary | ICD-10-CM | POA: Diagnosis not present

## 2016-07-07 DIAGNOSIS — I2510 Atherosclerotic heart disease of native coronary artery without angina pectoris: Secondary | ICD-10-CM | POA: Diagnosis not present

## 2016-07-07 DIAGNOSIS — J8410 Pulmonary fibrosis, unspecified: Secondary | ICD-10-CM | POA: Diagnosis not present

## 2016-07-07 DIAGNOSIS — K219 Gastro-esophageal reflux disease without esophagitis: Secondary | ICD-10-CM | POA: Diagnosis not present

## 2016-07-07 DIAGNOSIS — Z853 Personal history of malignant neoplasm of breast: Secondary | ICD-10-CM | POA: Diagnosis not present

## 2016-07-07 DIAGNOSIS — I110 Hypertensive heart disease with heart failure: Secondary | ICD-10-CM | POA: Diagnosis not present

## 2016-07-08 ENCOUNTER — Other Ambulatory Visit: Payer: Self-pay | Admitting: Internal Medicine

## 2016-07-11 NOTE — Telephone Encounter (Signed)
Please advise. Pt had 20 mg Lasix on med list. Refill request is for 40 mg.

## 2016-07-28 ENCOUNTER — Emergency Department (HOSPITAL_COMMUNITY): Payer: Medicare Other

## 2016-07-28 ENCOUNTER — Encounter (HOSPITAL_COMMUNITY): Payer: Self-pay | Admitting: Emergency Medicine

## 2016-07-28 ENCOUNTER — Emergency Department (HOSPITAL_COMMUNITY)
Admission: EM | Admit: 2016-07-28 | Discharge: 2016-07-28 | Disposition: A | Discharge status: Home / Self Care | Payer: Medicare Other | Attending: Emergency Medicine | Admitting: Emergency Medicine

## 2016-07-28 DIAGNOSIS — Z853 Personal history of malignant neoplasm of breast: Secondary | ICD-10-CM | POA: Diagnosis not present

## 2016-07-28 DIAGNOSIS — Z79899 Other long term (current) drug therapy: Secondary | ICD-10-CM | POA: Insufficient documentation

## 2016-07-28 DIAGNOSIS — I110 Hypertensive heart disease with heart failure: Secondary | ICD-10-CM | POA: Diagnosis not present

## 2016-07-28 DIAGNOSIS — Z7982 Long term (current) use of aspirin: Secondary | ICD-10-CM | POA: Diagnosis not present

## 2016-07-28 DIAGNOSIS — R079 Chest pain, unspecified: Secondary | ICD-10-CM | POA: Diagnosis not present

## 2016-07-28 DIAGNOSIS — Z791 Long term (current) use of non-steroidal anti-inflammatories (NSAID): Secondary | ICD-10-CM | POA: Insufficient documentation

## 2016-07-28 DIAGNOSIS — I2510 Atherosclerotic heart disease of native coronary artery without angina pectoris: Secondary | ICD-10-CM | POA: Insufficient documentation

## 2016-07-28 DIAGNOSIS — M25512 Pain in left shoulder: Secondary | ICD-10-CM | POA: Diagnosis not present

## 2016-07-28 DIAGNOSIS — R030 Elevated blood-pressure reading, without diagnosis of hypertension: Secondary | ICD-10-CM | POA: Diagnosis not present

## 2016-07-28 DIAGNOSIS — I5032 Chronic diastolic (congestive) heart failure: Secondary | ICD-10-CM | POA: Diagnosis not present

## 2016-07-28 DIAGNOSIS — M79602 Pain in left arm: Secondary | ICD-10-CM | POA: Diagnosis not present

## 2016-07-28 MED ORDER — MORPHINE SULFATE (PF) 4 MG/ML IV SOLN
4.0000 mg | Freq: Once | INTRAVENOUS | Status: DC
Start: 2016-07-28 — End: 2016-07-28

## 2016-07-28 MED ORDER — MORPHINE SULFATE (PF) 2 MG/ML IV SOLN
2.0000 mg | Freq: Once | INTRAVENOUS | Status: AC
  Administered 2016-07-28: 2 mg via INTRAVENOUS
  Filled 2016-07-28: qty 1

## 2016-07-28 MED ORDER — CYCLOBENZAPRINE HCL 10 MG PO TABS
5.0000 mg | ORAL_TABLET | Freq: Once | ORAL | Status: AC
  Administered 2016-07-28: 5 mg via ORAL
  Filled 2016-07-28: qty 1

## 2016-07-28 MED ORDER — CYCLOBENZAPRINE HCL 5 MG PO TABS: 5.0000 mg | ORAL_TABLET | Freq: Two times a day (BID) | ORAL | 0 refills | Status: AC | PRN

## 2016-07-28 NOTE — ED Notes (Signed)
MD at bedside. 

## 2016-07-28 NOTE — ED Triage Notes (Addendum)
Per EMS pt from home, c/o left shoulder pain with radiation into left neck and left wrist x several weeks, worsened this morning. Pain worse on movement of shoulder, reproducible with palpation. Pt on hospice, takes morphine injections for pain at home, last injection at 1500. A & O x 4. Pt wears 5 L/min O2 at home.

## 2016-07-28 NOTE — ED Provider Notes (Signed)
[facility] DEPT Provider Note   CSN: [id number] Arrival date & time: 07/28/16  1632    History   Chief Complaint Chief Complaint  Patient presents with  . Shoulder Pain    left    HPI [name] is a 82 y.o. female with a past medical history significant for CAD, CHF, pulmonary fibrosis, hypertension, hyperlipidemia who is currently on hospice who presents for left shoulder pain. Patient reports that for the last week, she has had left shoulder pain. She describes the pain as cramping, and worsened when she moves her shoulder. She reports that she has had a rotator cuff injury on the other side before. She denies a numbness, tingling, or weakness in the left upper extremity. She is right-handed. She denies any specific trauma but does say that she pulls herself up and uses the left arm frequently. She denies any headache, vision changes, neck pain, nausea, vomiting, constipation, diarrhea, dysuria. She reports that she has no recent change in shortness of breath or cough. She is on her home oxygen  requirement.  The history is provided by the patient and medical records. No language interpreter was used.  Shoulder Pain   This is a new problem. The current episode started more than 2 days ago. The problem occurs constantly. The problem has not changed since onset.The pain is present in the left shoulder and left arm. The pain is moderate. Pertinent negatives include no numbness, full range of motion, no stiffness, no tingling and no itching. The symptoms are aggravated by contact. She has tried nothing for the symptoms. There has been no history of extremity trauma.    Past Medical History:  Diagnosis Date  . Arthritis   . Breast cancer ([place]) 1969  . CAD (coronary artery disease)   . Chronic diastolic CHF (congestive heart failure) ([place]) 08/01/2015  . Colon polyp 2013   TUBULAR ADENOMA (X1  . Depression   . Diverticulosis 2008  . Dyspnea   . Esophageal stricture   .  Gallstones   . GERD (gastroesophageal reflux disease)   . HLD (hyperlipidemia)   . HTN (hypertension)   . Internal hemorrhoids   . Obesity   . Pulmonary fibrosis [facility])     Patient Active Problem List   Diagnosis Date Noted  . Cough 05/28/2016  . Physical deconditioning 04/20/2016  . Weakness generalized   . Dysphagia   . Pyrexia   . Sepsis ([place]) 09/05/2015  . Palliative care encounter 09/03/2015  . DNR (do not resuscitate) 09/03/2015  . Bronchitis, chronic obstructive w acute bronchitis ([place]) 09/02/2015  . HCAP (healthcare-associated pneumonia) 09/01/2015  . Dyspnea 08/31/2015  . Pain   . Depression 08/22/2015  . Chronic hyponatremia 08/03/2015  . Chronic diastolic CHF (congestive heart failure) ([place]) 08/01/2015  . SOB (shortness of breath) 08/01/2015  . Chronic respiratory failure with hypoxia ([place]) 07/28/2015  . Obesity 07/01/2015  . Hyperlipidemia 09/17/2014  . Coronary artery disease 08/17/2014  . Leukocytosis 08/17/2014  . Protein-calorie malnutrition ([place]) 08/17/2014  . 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 Change in bowel habits 07/24/2012  . Special screening for malignant neoplasms, colon 12/08/2011  . Stricture and stenosis of esophagus 12/08/2011  . NEOPLASM, MALIGNANT, RIGHT BREAST 11/28/2010  . OBSTRUCTIVE SLEEP APNEA 11/28/2010  . Essential hypertension 11/28/2010  . ALLERGIC RHINITIS 11/28/2010  . GERD 11/28/2010    Past Surgical History:  Procedure Laterality Date  . ABDOMINAL HYSTERECTOMY    . APPENDECTOMY    . BREAST SURGERY    .  CATARACT EXTRACTION Left 02/2016  . CHOLECYSTECTOMY    . FOOT SURGERY    . MASTECTOMY     right    OB History    No data available       Home Medications    Prior to Admission medications   Medication Sig Start Date End Date Taking? Authorizing Provider  acetaminophen (TYLENOL) 650 MG CR tablet Take 1,300 mg by mouth every 8 (eight) hours as needed for pain.    Historical Provider, MD    albuterol (PROVENTIL HFA;VENTOLIN HFA) 108 (90 BASE) MCG/ACT inhaler Inhale 2 puffs into the lungs every 4 (four) hours as needed for wheezing or shortness of breath.    Historical Provider, MD  aspirin EC 325 MG EC tablet Take 1 tablet (325 mg total) by mouth daily. 08/20/14   Marton Redwood, MD  benzonatate (TESSALON) 200 MG capsule Take 1 capsule (200 mg total) by mouth 3 (three) times daily as needed for cough. 09/04/15   Charlynne Cousins, MD  Cholecalciferol (VITAMIN D) 2000 UNITS tablet Take 2,000 Units by mouth at bedtime.    Historical Provider, MD  esomeprazole (NEXIUM) 20 MG capsule Take 40 mg by mouth every morning.     Historical Provider, MD  famotidine (PEPCID) 20 MG tablet One at bedtime 05/26/16   Tanda Rockers, MD  fluticasone [facility]) 50 MCG/ACT nasal spray Place 2 sprays into both nostrils 2 (two) times daily as needed for allergies or rhinitis.     Historical Provider, MD  furosemide (LASIX) 20 MG tablet Take 20 mg by mouth daily.    Historical Provider, MD  furosemide (LASIX) 40 MG tablet Take 1 tablet (40 mg total) by mouth every morning. 07/11/16   Binnie Rail, MD  ipratropium-albuterol (DUONEB) 0.5-2.5 (3) MG/3ML SOLN Take 3 mLs by nebulization every 6 (six) hours as needed. 08/05/15   Robbie Lis, MD  Magnesium 250 MG TABS Take 250 mg by mouth at bedtime.     Historical Provider, MD  MELATONIN PO Take 1 tablet by mouth at bedtime.    Historical Provider, MD  OXYGEN Oxygen 3lpm 24/7 DME- [facility]    Historical Provider, MD  polyethylene glycol (MIRALAX / GLYCOLAX) packet Take 17 g by mouth daily as needed. 09/10/15   Domenic Polite, MD  potassium chloride SA (K-DUR,KLOR-CON) 20 MEQ tablet Take 1 tablet (20 mEq total) by mouth daily. 06/01/16   Binnie Rail, MD  predniSONE (DELTASONE) 10 MG tablet 20 mg daily 01/07/16   Tanda Rockers, MD  senna-docusate (SENOKOT-S) 8.6-50 MG tablet Take 1 tablet by mouth at bedtime as needed for mild constipation. 09/10/15    Domenic Polite, MD  sertraline (ZOLOFT) 100 MG tablet Take 1 tablet (100 mg total) by mouth daily. 07/03/16   Binnie Rail, MD  sodium chloride (OCEAN) 0.65 % SOLN nasal spray Place 1 spray into both nostrils 2 (two) times daily. Patient taking differently: Place 1 spray into both nostrils daily as needed for congestion.  08/20/14   Marton Redwood, MD    Family History Family History  Problem Relation Age of Onset  . Heart disease Mother   . Cancer Mother     bladder  . Esophageal cancer Brother   . Prostate cancer Brother   . Heart disease Brother   . Cancer Brother     sarcoma  . Stomach cancer Neg Hx     Social History Social History  Substance Use Topics  . Smoking status:  Never Smoker  . Smokeless tobacco: Never Used  . Alcohol use 4.2 oz/week    7 Glasses of wine per week     Comment: ocaasional     Allergies   Anesthetics, amide; Atorvastatin; Codeine; Darvocet [propoxyphene n-acetaminophen]; Valsartan; Antihistamines, loratadine-type; and Moxifloxacin   Review of Systems Review of Systems  Constitutional: Negative for activity change, chills, diaphoresis, fatigue and fever.  HENT: Negative for congestion and rhinorrhea.   Eyes: Negative for visual disturbance.  Respiratory: Negative for cough, chest tightness, shortness of breath, wheezing and stridor.   Cardiovascular: Negative for chest pain, palpitations and leg swelling.  Gastrointestinal: Negative for abdominal distention, abdominal pain, constipation, diarrhea, nausea and vomiting.  Genitourinary: Negative for difficulty urinating, dysuria, flank pain, frequency, hematuria, menstrual problem and pelvic pain.  Musculoskeletal: Negative for back pain, neck pain and stiffness.  Skin: Negative for itching, rash and wound.  Neurological: Negative for dizziness, tingling, seizures, weakness, light-headedness, numbness and headaches.  Psychiatric/Behavioral: Negative for agitation and confusion.  All other systems  reviewed and are negative.    Physical Exam Updated Vital Signs BP 163/64 (BP Location: Right Arm)   Pulse 76   Temp 98.1 F (36.7 C) (Oral)   Resp 15   SpO2 97%   Physical Exam  Constitutional: She is oriented to person, place, and time. She appears well-developed and well-nourished. No distress.  HENT:  Head: Normocephalic and atraumatic.  Mouth/Throat: Oropharynx is clear and moist. No oropharyngeal exudate.  Eyes: Conjunctivae and EOM are normal. Pupils are equal, round, and reactive to light.  Neck: Normal range of motion. Neck supple.  Cardiovascular: Normal rate and regular rhythm.   No murmur heard. Pulmonary/Chest: Effort normal and breath sounds normal. No stridor. No respiratory distress.  Abdominal: Soft. There is no tenderness.  Musculoskeletal: Normal range of motion. She exhibits tenderness. She exhibits no edema or deformity.       Left shoulder: She exhibits tenderness and pain. She exhibits normal range of motion, no swelling, no crepitus, no deformity, no laceration, normal pulse and normal strength.       Arms: Neurological: She is alert and oriented to person, place, and time. She has normal reflexes. She displays normal reflexes. No cranial nerve deficit or sensory deficit. She exhibits normal muscle tone. Coordination normal. GCS eye subscore is 4. GCS verbal subscore is 5. GCS motor subscore is 6.  Skin: Skin is warm and dry. Capillary refill takes less than 2 seconds. No rash noted. She is not diaphoretic.  Psychiatric: She has a normal mood and affect.  Nursing note and vitals reviewed.    ED Treatments / Results  Labs (all labs ordered are listed, but only abnormal results are displayed) Labs Reviewed - No data to display  EKG  EKG Interpretation None       Radiology Dg Chest 2 View  Result Date: 07/28/2016 CLINICAL DATA:  Chest pain tonight. EXAM: CHEST  2 VIEW COMPARISON:  01/06/2016 FINDINGS: The cardiac silhouette, mediastinal and hilar  contours are within normal limits and stable. There is tortuosity and calcification of the thoracic aorta. Severe chronic lung disease with pulmonary fibrosis and bronchiectasis. No obvious acute overlying pulmonary process. No definite pleural effusions. The bony thorax is grossly intact. IMPRESSION: Severe chronic lung disease without definite acute overlying pulmonary process. Electronically Signed   By: Marijo Sanes M.D.   On: 07/28/2016 18:56   Dg Shoulder Left  Result Date: 07/28/2016 CLINICAL DATA:  Left shoulder and arm pain for several weeks.  EXAM: LEFT HUMERUS - 2+ VIEW; LEFT SHOULDER - 2+ VIEW COMPARISON:  None. FINDINGS: The shoulder and elbow joints are maintained. Mild degenerative changes at the [facility] joint. No fracture, dislocation or bone lesion. IMPRESSION: No acute bony findings. Electronically Signed   By: Marijo Sanes M.D.   On: 07/28/2016 18:53   Dg Humerus Left  Result Date: 07/28/2016 CLINICAL DATA:  Left shoulder and arm pain for several weeks. EXAM: LEFT HUMERUS - 2+ VIEW; LEFT SHOULDER - 2+ VIEW COMPARISON:  None. FINDINGS: The shoulder and elbow joints are maintained. Mild degenerative changes at the [facility] joint. No fracture, dislocation or bone lesion. IMPRESSION: No acute bony findings. Electronically Signed   By: Marijo Sanes M.D.   On: 07/28/2016 18:53    Procedures Procedures (including critical care time)  Medications Ordered in ED Medications  morphine 2 MG/ML injection 2 mg (2 mg Intravenous Given 07/28/16 1739)  cyclobenzaprine (FLEXERIL) tablet 5 mg (5 mg Oral Given 07/28/16 2034)     Initial Impression / Assessment and Plan / ED Course  I have reviewed the triage vital signs and the nursing notes.  Pertinent labs & imaging results that were available during my care of the patient were reviewed by me and considered in my medical decision making (see chart for details).  Clinical Course    [name] is a 82 y.o. female with a past medical history  significant for CAD, CHF, pulmonary fibrosis, hypertension, hyperlipidemia who is currently on hospice who presents for left shoulder pain. History and exam are above. Patient unsure of specific trauma however, she feels that she [facility] pulled her shoulder while pulling herself up recently. She reports that it is worse when she is using it and with palpation. Patient has no neck tenderness, no neck stiffness, and no neurologic deficits. Patient is right-hand dominant. Patient has no numbness, tingling, or weakness in left arm.  Patient had no chest pain, palpitations, and no wheezing. Feel symptoms are musculoskeletal. Patient given pain medication with significant improvement in pain.  X-rays were obtained which showed no abnormalities in the chest, humerus, or shoulder. Specifically, no fractures. Patient reports that it does feel similar to when she injured her other shoulder in the past.  Patient has strong pain medications at home with her injectable morphine, however, muscle relaxants were added given concern for muscle spasm components to shoulder pain.   Patient had no other questions or concerns and patient was discharged in good condition.   Final Clinical Impressions(s) / ED Diagnoses   Final diagnoses:  Left shoulder pain    New Prescriptions Discharge Medication List as of 07/28/2016  8:05 PM    START taking these medications   Details  cyclobenzaprine (FLEXERIL) 5 MG tablet Take 1 tablet (5 mg total) by mouth 2 (two) times daily as needed for muscle spasms., Starting Fri 07/28/2016, Print        Clinical Impression: 1. Left shoulder pain     Disposition: Discharge  Condition: Good  I have discussed the results, Dx and Tx plan with the pt(& family if present). He/she/they expressed understanding and agree(s) with the plan. Discharge instructions discussed at great length. Strict return precautions discussed and pt &/or family have verbalized understanding of the  instructions. No further questions at time of discharge.    Discharge Medication List as of 07/28/2016  8:05 PM    START taking these medications   Details  cyclobenzaprine (FLEXERIL) 5 MG tablet Take 1 tablet (5 mg total)  by mouth 2 (two) times daily as needed for muscle spasms., Starting Fri 07/28/2016, Print        Follow Up: Binnie Rail, MD [address] [place] [id number] [phone]  Schedule an appointment as soon as possible for a visit  If symptoms worsen, please return to the nearest ED.     Gwenyth Allegra Tegeler, MD 07/29/16 [phone]

## 2016-08-01 ENCOUNTER — Telehealth: Payer: Self-pay | Admitting: Emergency Medicine

## 2016-08-01 NOTE — Telephone Encounter (Signed)
Spoke with Hospice nurse, states pt went to the ED on 9/22 for shoulder pain, was given Flexeril, does not feel like it helped the pain any. X-rays came back normal. Pt is currently taking Morphine 1.25 mg PRN, took 2 times yesterday with flexeril and did not have nay relief. RN thinks that pt would benefit from Ortho consult or a visit with Sports med. Please advise if Morphine dose should be increased (RN states that they typically give morphine for pain). Pt has not fallen recently to cause pain to her shoulder.

## 2016-08-01 NOTE — Telephone Encounter (Signed)
Will refer to sports med/ortho - will try to get her in quickly. If pain is not severe lets not increase morphine - if severe ok to increase to 2 mg

## 2016-08-02 NOTE — Telephone Encounter (Signed)
Spoke with pt, scheduled appt with Terri Piedra, NP. Pt was unable to come Friday due to Hospice appt.

## 2016-08-03 ENCOUNTER — Ambulatory Visit (INDEPENDENT_AMBULATORY_CARE_PROVIDER_SITE_OTHER): Admitting: Family

## 2016-08-03 ENCOUNTER — Emergency Department (HOSPITAL_COMMUNITY)
Admission: EM | Admit: 2016-08-03 | Discharge: 2016-08-03 | Disposition: A | Discharge status: Home / Self Care | Payer: Medicare Other | Attending: Emergency Medicine | Admitting: Emergency Medicine

## 2016-08-03 ENCOUNTER — Encounter (HOSPITAL_COMMUNITY): Payer: Self-pay | Admitting: Emergency Medicine

## 2016-08-03 ENCOUNTER — Emergency Department (HOSPITAL_COMMUNITY): Payer: Medicare Other

## 2016-08-03 ENCOUNTER — Encounter: Payer: Self-pay | Admitting: Family

## 2016-08-03 DIAGNOSIS — S0511XA Contusion of eyeball and orbital tissues, right eye, initial encounter: Secondary | ICD-10-CM | POA: Insufficient documentation

## 2016-08-03 DIAGNOSIS — Z853 Personal history of malignant neoplasm of breast: Secondary | ICD-10-CM | POA: Diagnosis not present

## 2016-08-03 DIAGNOSIS — I2510 Atherosclerotic heart disease of native coronary artery without angina pectoris: Secondary | ICD-10-CM | POA: Diagnosis not present

## 2016-08-03 DIAGNOSIS — Y999 Unspecified external cause status: Secondary | ICD-10-CM | POA: Diagnosis not present

## 2016-08-03 DIAGNOSIS — R930 Abnormal findings on diagnostic imaging of skull and head, not elsewhere classified: Secondary | ICD-10-CM | POA: Insufficient documentation

## 2016-08-03 DIAGNOSIS — M25512 Pain in left shoulder: Secondary | ICD-10-CM

## 2016-08-03 DIAGNOSIS — Z23 Encounter for immunization: Secondary | ICD-10-CM

## 2016-08-03 DIAGNOSIS — I110 Hypertensive heart disease with heart failure: Secondary | ICD-10-CM | POA: Diagnosis not present

## 2016-08-03 DIAGNOSIS — I5032 Chronic diastolic (congestive) heart failure: Secondary | ICD-10-CM | POA: Diagnosis not present

## 2016-08-03 DIAGNOSIS — S0990XA Unspecified injury of head, initial encounter: Secondary | ICD-10-CM | POA: Diagnosis not present

## 2016-08-03 DIAGNOSIS — W228XXA Striking against or struck by other objects, initial encounter: Secondary | ICD-10-CM | POA: Diagnosis not present

## 2016-08-03 DIAGNOSIS — Y9301 Activity, walking, marching and hiking: Secondary | ICD-10-CM | POA: Insufficient documentation

## 2016-08-03 DIAGNOSIS — S0181XA Laceration without foreign body of other part of head, initial encounter: Secondary | ICD-10-CM

## 2016-08-03 DIAGNOSIS — S01111A Laceration without foreign body of right eyelid and periocular area, initial encounter: Secondary | ICD-10-CM | POA: Diagnosis not present

## 2016-08-03 DIAGNOSIS — Y92511 Restaurant or cafe as the place of occurrence of the external cause: Secondary | ICD-10-CM | POA: Diagnosis not present

## 2016-08-03 DIAGNOSIS — S0993XA Unspecified injury of face, initial encounter: Secondary | ICD-10-CM | POA: Diagnosis present

## 2016-08-03 NOTE — Patient Instructions (Signed)
Thank you for choosing [facility].  SUMMARY AND INSTRUCTIONS:  Ice x 20 minutes every 2 hours as needed.  Sling as needed for discomfort.   Continue the muscle relaxer.  Medication:  Please continue to take your medications as prescribed.  Consider a cortisone injection.  Your prescription(s) have been submitted to your pharmacy or been printed and provided for you. Please take as directed and contact our office if you believe you are having problem(s) with the medication(s) or have any questions.   Follow up:  If your symptoms worsen or fail to improve, please contact our office for further instruction, or in case of emergency go directly to the emergency room at the closest medical facility.    Impingement Syndrome, Rotator Cuff, Bursitis With Rehab Impingement syndrome is a condition that involves inflammation of the tendons of the rotator cuff and the subacromial bursa, that causes pain in the shoulder. The rotator cuff consists of four tendons and muscles that control much of the shoulder and upper arm function. The subacromial bursa is a fluid filled sac that helps reduce friction between the rotator cuff and one of the bones of the shoulder (acromion). Impingement syndrome is usually an overuse injury that causes swelling of the bursa (bursitis), swelling of the tendon (tendonitis), and/or a tear of the tendon (strain). Strains are classified into three categories. Grade 1 strains cause pain, but the tendon is not lengthened. Grade 2 strains include a lengthened ligament, due to the ligament being stretched or partially ruptured. With grade 2 strains there is still function, although the function may be decreased. Grade 3 strains include a complete tear of the tendon or muscle, and function is usually impaired. SYMPTOMS   Pain around the shoulder, often at the outer portion of the upper arm.  Pain that gets worse with shoulder function, especially when reaching overhead or  lifting.  Sometimes, aching when not using the arm.  Pain that wakes you up at night.  Sometimes, tenderness, swelling, warmth, or redness over the affected area.  Loss of strength.  Limited motion of the shoulder, especially reaching behind the back (to the back pocket or to unhook bra) or across your body.  Crackling sound (crepitation) when moving the arm.  Biceps tendon pain and inflammation (in the front of the shoulder). Worse when bending the elbow or lifting. CAUSES  Impingement syndrome is often an overuse injury, in which chronic (repetitive) motions cause the tendons or bursa to become inflamed. A strain occurs when a force is paced on the tendon or muscle that is greater than it can withstand. Common mechanisms of injury include: Stress from sudden increase in duration, frequency, or intensity of training.  Direct hit (trauma) to the shoulder.  Aging, erosion of the tendon with normal use.  Bony bump on shoulder (acromial spur). RISK INCREASES WITH:  Contact sports (football, wrestling, boxing).  Throwing sports (baseball, tennis, volleyball).  Weightlifting and bodybuilding.  Heavy labor.  Previous injury to the rotator cuff, including impingement.  Poor shoulder strength and flexibility.  Failure to warm up properly before activity.  Inadequate protective equipment.  Old age.  Bony bump on shoulder (acromial spur). PREVENTION   Warm up and stretch properly before activity.  Allow for adequate recovery between workouts.  Maintain physical fitness:  Strength, flexibility, and endurance.  Cardiovascular fitness.  Learn and use proper exercise technique. PROGNOSIS  If treated properly, impingement syndrome usually goes away within 6 weeks. Sometimes surgery is required.  RELATED COMPLICATIONS  Longer healing time if not properly treated, or if not given enough time to heal.  Recurring symptoms, that result in a chronic condition.  Shoulder  stiffness, frozen shoulder, or loss of motion.  Rotator cuff tendon tear.  Recurring symptoms, especially if activity is resumed too soon, with overuse, with a direct blow, or when using poor technique. TREATMENT  Treatment first involves the use of ice and medicine, to reduce pain and inflammation. The use of strengthening and stretching exercises may help reduce pain with activity. These exercises may be performed at home or with a therapist. If non-surgical treatment is unsuccessful after more than 6 months, surgery may be advised. After surgery and rehabilitation, activity is usually possible in 3 months.  MEDICATION  If pain medicine is needed, nonsteroidal anti-inflammatory medicines (aspirin and ibuprofen), or other minor pain relievers (acetaminophen), are often advised.  Do not take pain medicine for 7 days before surgery.  Prescription pain relievers may be given, if your caregiver thinks they are needed. Use only as directed and only as much as you need.  Corticosteroid injections may be given by your caregiver. These injections should be reserved for the most serious cases, because they may only be given a certain number of times. HEAT AND COLD  Cold treatment (icing) should be applied for 10 to 15 minutes every 2 to 3 hours for inflammation and pain, and immediately after activity that aggravates your symptoms. Use ice packs or an ice massage.  Heat treatment may be used before performing stretching and strengthening activities prescribed by your caregiver, physical therapist, or athletic trainer. Use a heat pack or a warm water soak. SEEK MEDICAL CARE IF:   Symptoms get worse or do not improve in 4 to 6 weeks, despite treatment.  New, unexplained symptoms develop. (Drugs used in treatment may produce side effects.) EXERCISES  RANGE OF MOTION (ROM) AND STRETCHING EXERCISES - Impingement Syndrome (Rotator Cuff  Tendinitis, Bursitis) These exercises may help you when beginning  to rehabilitate your injury. Your symptoms may go away with or without further involvement from your physician, physical therapist or athletic trainer. While completing these exercises, remember:   Restoring tissue flexibility helps normal motion to return to the joints. This allows healthier, less painful movement and activity.  An effective stretch should be held for at least 30 seconds.  A stretch should never be painful. You should only feel a gentle lengthening or release in the stretched tissue. STRETCH - Flexion, Standing  Stand with good posture. With an underhand grip on your right / left hand, and an overhand grip on the opposite hand, grasp a broomstick or cane so that your hands are a little more than shoulder width apart.  Keeping your right / left elbow straight and shoulder muscles relaxed, push the stick with your opposite hand, to raise your right / left arm in front of your body and then overhead. Raise your arm until you feel a stretch in your right / left shoulder, but before you have increased shoulder pain.  Try to avoid shrugging your right / left shoulder as your arm rises, by keeping your shoulder blade tucked down and toward your mid-back spine. Hold for __________ seconds.  Slowly return to the starting position. Repeat __________ times. Complete this exercise __________ times per day. STRETCH - Abduction, Supine  Lie on your back. With an underhand grip on your right / left hand and an overhand grip on the opposite hand, grasp a broomstick or cane so  that your hands are a little more than shoulder width apart.  Keeping your right / left elbow straight and your shoulder muscles relaxed, push the stick with your opposite hand, to raise your right / left arm out to the side of your body and then overhead. Raise your arm until you feel a stretch in your right / left shoulder, but before you have increased shoulder pain.  Try to avoid shrugging your right / left shoulder  as your arm rises, by keeping your shoulder blade tucked down and toward your mid-back spine. Hold for __________ seconds.  Slowly return to the starting position. Repeat __________ times. Complete this exercise __________ times per day. ROM - Flexion, Active-Assisted  Lie on your back. You may bend your knees for comfort.  Grasp a broomstick or cane so your hands are about shoulder width apart. Your right / left hand should grip the end of the stick, so that your hand is positioned "thumbs-up," as if you were about to shake hands.  Using your healthy arm to lead, raise your right / left arm overhead, until you feel a gentle stretch in your shoulder. Hold for __________ seconds.  Use the stick to assist in returning your right / left arm to its starting position. Repeat __________ times. Complete this exercise __________ times per day.  ROM - Internal Rotation, Supine   Lie on your back on a firm surface. Place your right / left elbow about 60 degrees away from your side. Elevate your elbow with a folded towel, so that the elbow and shoulder are the same height.  Using a broomstick or cane and your strong arm, pull your right / left hand toward your body until you feel a gentle stretch, but no increase in your shoulder pain. Keep your shoulder and elbow in place throughout the exercise.  Hold for __________ seconds. Slowly return to the starting position. Repeat __________ times. Complete this exercise __________ times per day. STRETCH - Internal Rotation  Place your right / left hand behind your back, palm up.  Throw a towel or belt over your opposite shoulder. Grasp the towel with your right / left hand.  While keeping an upright posture, gently pull up on the towel, until you feel a stretch in the front of your right / left shoulder.  Avoid shrugging your right / left shoulder as your arm rises, by keeping your shoulder blade tucked down and toward your mid-back spine.  Hold for  __________ seconds. Release the stretch, by lowering your healthy hand. Repeat __________ times. Complete this exercise __________ times per day. ROM - Internal Rotation   Using an underhand grip, grasp a stick behind your back with both hands.  While standing upright with good posture, slide the stick up your back until you feel a mild stretch in the front of your shoulder.  Hold for __________ seconds. Slowly return to your starting position. Repeat __________ times. Complete this exercise __________ times per day.  STRETCH - Posterior Shoulder Capsule   Stand or sit with good posture. Grasp your right / left elbow and draw it across your chest, keeping it at the same height as your shoulder.  Pull your elbow, so your upper arm comes in closer to your chest. Pull until you feel a gentle stretch in the back of your shoulder.  Hold for __________ seconds. Repeat __________ times. Complete this exercise __________ times per day. STRENGTHENING EXERCISES - Impingement Syndrome (Rotator Cuff Tendinitis, Bursitis) These exercises may  help you when beginning to rehabilitate your injury. They may resolve your symptoms with or without further involvement from your physician, physical therapist or athletic trainer. While completing these exercises, remember:  Muscles can gain both the endurance and the strength needed for everyday activities through controlled exercises.  Complete these exercises as instructed by your physician, physical therapist or athletic trainer. Increase the resistance and repetitions only as guided.  You may experience muscle soreness or fatigue, but the pain or discomfort you are trying to eliminate should never worsen during these exercises. If this pain does get worse, stop and make sure you are following the directions exactly. If the pain is still present after adjustments, discontinue the exercise until you can discuss the trouble with your clinician.  During your  recovery, avoid activity or exercises which involve actions that place your injured hand or elbow above your head or behind your back or head. These positions stress the tissues which you are trying to heal. STRENGTH - Scapular Depression and Adduction   With good posture, sit on a firm chair. Support your arms in front of you, with pillows, arm rests, or on a table top. Have your elbows in line with the sides of your body.  Gently draw your shoulder blades down and toward your mid-back spine. Gradually increase the tension, without tensing the muscles along the top of your shoulders and the back of your neck.  Hold for __________ seconds. Slowly release the tension and relax your muscles completely before starting the next repetition.  After you have practiced this exercise, remove the arm support and complete the exercise in standing as well as sitting position. Repeat __________ times. Complete this exercise __________ times per day.  STRENGTH - Shoulder Abductors, Isometric  With good posture, stand or sit about 4-6 inches from a wall, with your right / left side facing the wall.  Bend your right / left elbow. Gently press your right / left elbow into the wall. Increase the pressure gradually, until you are pressing as hard as you can, without shrugging your shoulder or increasing any shoulder discomfort.  Hold for __________ seconds.  Release the tension slowly. Relax your shoulder muscles completely before you begin the next repetition. Repeat __________ times. Complete this exercise __________ times per day.  STRENGTH - External Rotators, Isometric  Keep your right / left elbow at your side and bend it 90 degrees.  Step into a door frame so that the outside of your right / left wrist can press against the door frame without your upper arm leaving your side.  Gently press your right / left wrist into the door frame, as if you were trying to swing the back of your hand away from your  stomach. Gradually increase the tension, until you are pressing as hard as you can, without shrugging your shoulder or increasing any shoulder discomfort.  Hold for __________ seconds.  Release the tension slowly. Relax your shoulder muscles completely before you begin the next repetition. Repeat __________ times. Complete this exercise __________ times per day.  STRENGTH - Supraspinatus   Stand or sit with good posture. Grasp a __________ weight, or an exercise band or tubing, so that your hand is "thumbs-up," like you are shaking hands.  Slowly lift your right / left arm in a "V" away from your thigh, diagonally into the space between your side and straight ahead. Lift your hand to shoulder height or as far as you can, without increasing any shoulder pain.  At first, many people do not lift their hands above shoulder height.  Avoid shrugging your right / left shoulder as your arm rises, by keeping your shoulder blade tucked down and toward your mid-back spine.  Hold for __________ seconds. Control the descent of your hand, as you slowly return to your starting position. Repeat __________ times. Complete this exercise __________ times per day.  STRENGTH - External Rotators  Secure a rubber exercise band or tubing to a fixed object (table, pole) so that it is at the same height as your right / left elbow when you are standing or sitting on a firm surface.  Stand or sit so that the secured exercise band is at your uninjured side.  Bend your right / left elbow 90 degrees. Place a folded towel or small pillow under your right / left arm, so that your elbow is a few inches away from your side.  Keeping the tension on the exercise band, pull it away from your body, as if pivoting on your elbow. Be sure to keep your body steady, so that the movement is coming only from your rotating shoulder.  Hold for __________ seconds. Release the tension in a controlled manner, as you return to the starting  position. Repeat __________ times. Complete this exercise __________ times per day.  STRENGTH - Internal Rotators   Secure a rubber exercise band or tubing to a fixed object (table, pole) so that it is at the same height as your right / left elbow when you are standing or sitting on a firm surface.  Stand or sit so that the secured exercise band is at your right / left side.  Bend your elbow 90 degrees. Place a folded towel or small pillow under your right / left arm so that your elbow is a few inches away from your side.  Keeping the tension on the exercise band, pull it across your body, toward your stomach. Be sure to keep your body steady, so that the movement is coming only from your rotating shoulder.  Hold for __________ seconds. Release the tension in a controlled manner, as you return to the starting position. Repeat __________ times. Complete this exercise __________ times per day.  STRENGTH - Scapular Protractors, Standing   Stand arms length away from a wall. Place your hands on the wall, keeping your elbows straight.  Begin by dropping your shoulder blades down and toward your mid-back spine.  To strengthen your protractors, keep your shoulder blades down, but slide them forward on your rib cage. It will feel as if you are lifting the back of your rib cage away from the wall. This is a subtle motion and can be challenging to complete. Ask your caregiver for further instruction, if you are not sure you are doing the exercise correctly.  Hold for __________ seconds. Slowly return to the starting position, resting the muscles completely before starting the next repetition. Repeat __________ times. Complete this exercise __________ times per day. STRENGTH - Scapular Protractors, Supine  Lie on your back on a firm surface. Extend your right / left arm straight into the air while holding a __________ weight in your hand.  Keeping your head and back in place, lift your shoulder off  the floor.  Hold for __________ seconds. Slowly return to the starting position, and allow your muscles to relax completely before starting the next repetition. Repeat __________ times. Complete this exercise __________ times per day. STRENGTH - Scapular Protractors, Quadruped  Get onto  your hands and knees, with your shoulders directly over your hands (or as close as you can be, comfortably).  Keeping your elbows locked, lift the back of your rib cage up into your shoulder blades, so your mid-back rounds out. Keep your neck muscles relaxed.  Hold this position for __________ seconds. Slowly return to the starting position and allow your muscles to relax completely before starting the next repetition. Repeat __________ times. Complete this exercise __________ times per day.  STRENGTH - Scapular Retractors  Secure a rubber exercise band or tubing to a fixed object (table, pole), so that it is at the height of your shoulders when you are either standing, or sitting on a firm armless chair.  With a palm down grip, grasp an end of the band in each hand. Straighten your elbows and lift your hands straight in front of you, at shoulder height. Step back, away from the secured end of the band, until it becomes tense.  Squeezing your shoulder blades together, draw your elbows back toward your sides, as you bend them. Keep your upper arms lifted away from your body throughout the exercise.  Hold for __________ seconds. Slowly ease the tension on the band, as you reverse the directions and return to the starting position. Repeat __________ times. Complete this exercise __________ times per day. STRENGTH - Shoulder Extensors   Secure a rubber exercise band or tubing to a fixed object (table, pole) so that it is at the height of your shoulders when you are either standing, or sitting on a firm armless chair.  With a thumbs-up grip, grasp an end of the band in each hand. Straighten your elbows and lift  your hands straight in front of you, at shoulder height. Step back, away from the secured end of the band, until it becomes tense.  Squeezing your shoulder blades together, pull your hands down to the sides of your thighs. Do not allow your hands to go behind you.  Hold for __________ seconds. Slowly ease the tension on the band, as you reverse the directions and return to the starting position. Repeat __________ times. Complete this exercise __________ times per day.  STRENGTH - Scapular Retractors and External Rotators   Secure a rubber exercise band or tubing to a fixed object (table, pole) so that it is at the height as your shoulders, when you are either standing, or sitting on a firm armless chair.  With a palm down grip, grasp an end of the band in each hand. Bend your elbows 90 degrees and lift your elbows to shoulder height, at your sides. Step back, away from the secured end of the band, until it becomes tense.  Squeezing your shoulder blades together, rotate your shoulders so that your upper arms and elbows remain stationary, but your fists travel upward to head height.  Hold for __________ seconds. Slowly ease the tension on the band, as you reverse the directions and return to the starting position. Repeat __________ times. Complete this exercise __________ times per day.  STRENGTH - Scapular Retractors and External Rotators, Rowing   Secure a rubber exercise band or tubing to a fixed object (table, pole) so that it is at the height of your shoulders, when you are either standing, or sitting on a firm armless chair.  With a palm down grip, grasp an end of the band in each hand. Straighten your elbows and lift your hands straight in front of you, at shoulder height. Step back, away from the  secured end of the band, until it becomes tense.  Step 1: Squeeze your shoulder blades together. Bending your elbows, draw your hands to your chest, as if you are rowing a boat. At the end of  this motion, your hands and elbow should be at shoulder height and your elbows should be out to your sides.  Step 2: Rotate your shoulders, to raise your hands above your head. Your forearms should be vertical and your upper arms should be horizontal.  Hold for __________ seconds. Slowly ease the tension on the band, as you reverse the directions and return to the starting position. Repeat __________ times. Complete this exercise __________ times per day.  STRENGTH - Scapular Depressors  Find a sturdy chair without wheels, such as a dining room chair.  Keeping your feet on the floor, and your hands on the chair arms, lift your bottom up from the seat, and lock your elbows.  Keeping your elbows straight, allow gravity to pull your body weight down. Your shoulders will rise toward your ears.  Raise your body against gravity by drawing your shoulder blades down your back, shortening the distance between your shoulders and ears. Although your feet should always maintain contact with the floor, your feet should progressively support less body weight, as you get stronger.  Hold for __________ seconds. In a controlled and slow manner, lower your body weight to begin the next repetition. Repeat __________ times. Complete this exercise __________ times per day.    This information is not intended to replace advice given to you by your health care provider. Make sure you discuss any questions you have with your health care provider.   Document Released: 10/23/2005 Document Revised: 11/13/2014 Document Reviewed: 02/04/2009 [employer] Interactive Patient Education [employer].

## 2016-08-03 NOTE — Progress Notes (Signed)
Subjective:    Patient ID: [name], female    DOB: 1928/09/24, 82 y.o.   MRN: [id number]  Chief Complaint  Patient presents with  . Shoulder Pain    left shoulder and arm has causing pain, pain gets bad when moving a certain way    HPI:  [name] is a 82 y.o. female who  has a past medical history of Arthritis; Breast cancer ([facility]) (1969); CAD (coronary artery disease); Chronic diastolic CHF (congestive heart failure) ([place]) (08/01/2015); Colon polyp (2013); Depression; Diverticulosis (2008); Dyspnea; Esophageal stricture; Gallstones; GERD (gastroesophageal reflux disease); HLD (hyperlipidemia); HTN (hypertension); Internal hemorrhoids; Obesity; and Pulmonary fibrosis ([place]). and presents today for an office visit.   This is a new problem. Associated symptom of pain located in her left shoulder has been going on for about 1 week. Described as achy and results in some numbness and tingling. The pain does not wake her up from sleep. Denies any specific trauma or injury to the area. No sounds sensations heard or felt. She is right hand dominant. She as been seen in the ED and given a muscle relaxer. X-rays were negative for fracture. Does have difficulty lifting the arm independtly and has a slow drop arm. Course of the symptoms are stable. Not able to wash her back and barely able to wash her hair.    Allergies  Allergen Reactions  . Anesthetics, Amide Nausea And Vomiting  . Atorvastatin Other (See Comments)    Myalgia   . Codeine Nausea And Vomiting  . Darvocet [Propoxyphene N-Acetaminophen] Nausea And Vomiting  . Valsartan Other (See Comments)    Per patient " did not feel well, cannot describe it"  . Antihistamines, Loratadine-Type Other (See Comments)    Weird dreams  . Moxifloxacin Anxiety and Other (See Comments)    Nightmares      Outpatient Medications Prior to Visit  Medication Sig Dispense Refill  . cyclobenzaprine (FLEXERIL) 5 MG tablet Take 1 tablet (5 mg  total) by mouth 2 (two) times daily as needed for muscle spasms. 6 tablet 0  . esomeprazole (NEXIUM) 20 MG capsule Take 40 mg by mouth every morning.     . famotidine (PEPCID) 20 MG tablet One at bedtime (Patient not taking: Reported on 08/03/2016)    . fluticasone (FLONASE) 50 MCG/ACT nasal spray Place 2 sprays into both nostrils 2 (two) times daily as needed for allergies or rhinitis.     . furosemide (LASIX) 40 MG tablet Take 1 tablet (40 mg total) by mouth every morning. 30 tablet 2  . LORazepam (ATIVAN) 0.5 MG tablet TK 1/2 T PO Q 4 H PRN FOR ANXIETY OR SHORTNESS OF BREATH  3  . Magnesium 250 MG TABS Take 250 mg by mouth at bedtime.     . Morphine Sulfate (MORPHINE CONCENTRATE) 10 mg / 0.5 ml concentrated solution TK 0.125 ML  PO Q 4 H PRN PAIN  0  . MUCUS RELIEF ER 600 MG 12 hr tablet Take 1 tablet by mouth in the am and Take 2 tablets at qhs  3  . OXYGEN Oxygen 3lpm 24/7 DME- [facility]    . polyethylene glycol (MIRALAX / GLYCOLAX) packet Take 17 g by mouth daily as needed. (Patient taking differently: Take 17 g by mouth daily as needed for mild constipation. ) 14 each 0  . potassium chloride SA (K-DUR,KLOR-CON) 20 MEQ tablet Take 1 tablet (20 mEq total) by mouth daily. 30 tablet 5  . predniSONE (  DELTASONE) 10 MG tablet 20 mg daily (Patient taking differently: Take 10 mg by mouth daily with breakfast. Take along with 20 mg tablet=30 mg daily.) 60 tablet 1  . predniSONE (DELTASONE) 20 MG tablet Take 20 mg by mouth daily with breakfast. Take along with 10 mg tablet=30 mg daily  3  . sertraline (ZOLOFT) 100 MG tablet Take 1 tablet (100 mg total) by mouth daily. 30 tablet 2  . sodium chloride (OCEAN) 0.65 % SOLN nasal spray Place 1 spray into both nostrils 2 (two) times daily. (Patient taking differently: Place 1 spray into both nostrils daily as needed for congestion. )  0  . acetaminophen (TYLENOL) 650 MG CR tablet Take 1,300 mg by mouth every 8 (eight) hours as needed for pain.    Marland Kitchen  albuterol (PROVENTIL HFA;VENTOLIN HFA) 108 (90 BASE) MCG/ACT inhaler Inhale 2 puffs into the lungs every 4 (four) hours as needed for wheezing or shortness of breath.    Marland Kitchen aspirin EC 325 MG EC tablet Take 1 tablet (325 mg total) by mouth daily. 30 tablet 0  . benzonatate (TESSALON) 200 MG capsule Take 1 capsule (200 mg total) by mouth 3 (three) times daily as needed for cough. 20 capsule 0  . Cholecalciferol (VITAMIN D) 2000 UNITS tablet Take 2,000 Units by mouth at bedtime.    . haloperidol (HALDOL) 1 MG tablet TK 1 T PO Q 4 H PRN  FOR NAUSEA/DELIRIUM/AGITATION  3  . ipratropium-albuterol (DUONEB) 0.5-2.5 (3) MG/3ML SOLN Take 3 mLs by nebulization every 6 (six) hours as needed. 360 mL 0  . senna-docusate (SENOKOT-S) 8.6-50 MG tablet Take 1 tablet by mouth at bedtime as needed for mild constipation. (Patient not taking: Reported on 07/28/2016)     No facility-administered medications prior to visit.       Past Surgical History:  Procedure Laterality Date  . ABDOMINAL HYSTERECTOMY    . APPENDECTOMY    . BREAST SURGERY    . CATARACT EXTRACTION Left 02/2016  . CHOLECYSTECTOMY    . FOOT SURGERY    . MASTECTOMY     right      Past Medical History:  Diagnosis Date  . Arthritis   . Breast cancer ([place]) 1969  . CAD (coronary artery disease)   . Chronic diastolic CHF (congestive heart failure) ([place]) 08/01/2015  . Colon polyp 2013   TUBULAR ADENOMA (X1  . Depression   . Diverticulosis 2008  . Dyspnea   . Esophageal stricture   . Gallstones   . GERD (gastroesophageal reflux disease)   . HLD (hyperlipidemia)   . HTN (hypertension)   . Internal hemorrhoids   . Obesity   . Pulmonary fibrosis ([place])       Review of Systems  Constitutional: Negative for chills and fever.  Musculoskeletal:       Positive for left shoulder pain.  Neurological: Positive for weakness. Negative for numbness.      Objective:    BP 134/60 (BP Location: Left Arm, Patient Position: Sitting, Cuff Size:  Normal)   Pulse 92   Temp 97.4 F (36.3 C) (Oral)   Resp 16   Ht 5\' 1"  (1.549 m)   Wt 147 lb (66.7 kg)   SpO2 96%   BMI 27.78 kg/m  Nursing note and vital signs reviewed.  Physical Exam  Constitutional: She is oriented to person, place, and time. She appears well-developed and well-nourished. No distress.  Cardiovascular: Normal rate, regular rhythm, normal heart sounds and intact distal pulses.  Pulmonary/Chest: Effort normal and breath sounds normal.  Musculoskeletal:  Left shoulder - no obvious deformity, discoloration, or edema. Palpable tenderness along subacromial space, infraspinatus and supraspinatus tendons with no crepitus or deformity noted. Range of motion is limited to 90 in flexion and abduction with slow arm drop noted during flexion and abduction. There is decreased strength in internal and external rotation. Distal pulses and sensation are intact and appropriate. Negative Michel Bickers; positive Neer's; positive empty can  Neurological: She is alert and oriented to person, place, and time.  Skin: Skin is warm and dry.  Psychiatric: She has a normal mood and affect. Her behavior is normal. Judgment and thought content normal.       Assessment & Plan:   Problem List Items Addressed This Visit      Other   Left shoulder pain    Symptoms and exam consistent with possible rotator cuff tear. Patient declines cortisone injection. Not likely a surgical candidate given other comorbid conditions. Recommend ice and home exercise therapy. Continue pain medication as prescribed by hospice. Sling as needed for comfort. Follow-up as needed or if symptoms worsen.       Other Visit Diagnoses   None.      I have discontinued Ms. Crisp's aspirin, ipratropium-albuterol, benzonatate, Vitamin D, acetaminophen, senna-docusate, albuterol, and haloperidol. I am also having her maintain her fluticasone, sodium chloride, esomeprazole, Magnesium, polyethylene glycol, OXYGEN,  predniSONE, famotidine, potassium chloride SA, sertraline, furosemide, predniSONE, MUCUS RELIEF ER, LORazepam, morphine CONCENTRATE, and cyclobenzaprine.   Follow-up: Return if symptoms worsen or fail to improve.  Mauricio Po, FNP

## 2016-08-03 NOTE — ED Provider Notes (Signed)
[facility] DEPT Provider Note   CSN: [id number] Arrival date & time: 08/03/16  1823  By signing my name below, I, [name], attest that this documentation has been prepared under the direction and in the presence of [name], MD. Electronically Signed: Reola Suarez, ED Scribe. 08/03/16. 6:59 PM.  History   Chief Complaint Chief Complaint  Patient presents with  . Fall   The history is provided by the patient, a friend and the EMS personnel. No language interpreter was used.   HPI Comments: [name] is a 82 y.o. female BIB EMS, who presents to the Emergency Department complaining of right-sided temporal head pain with associated wound to the area s/p ground-level fall that occurred PTA. Pt reports that she was leaving a restaurant when a door that she was walking out of swung back and struck her on the anterior side of her body, causing her to fall into a sitting position. She notes that upon falling that the right side of her head struck the cement below her, sustaining her pain and wound. Pt denies LOC otherwise. She is not currently on anticoagulant or antiplatelet therapy. Pt has been ambulatory since the incident, but notes that she was ambulatory with assistance. She additionally notes that she has weakness and dizziness with ambulation, but states that these are baseline issues and were mildly exacerbated with her fall. Pt is currently on O2 therapy at home. Denies neck pain, back pain, SOB, new numbness, paraesthesias, visual changes, or any other associated symptoms. Tetanus is UTD.   Past Medical History:  Diagnosis Date  . Arthritis   . Breast cancer ([facility]) 1969  . CAD (coronary artery disease)   . Chronic diastolic CHF (congestive heart failure) ([place]) 08/01/2015  . Colon polyp 2013   TUBULAR ADENOMA (X1  . Depression   . Diverticulosis 2008  . Dyspnea   . Esophageal stricture   . Gallstones   . GERD (gastroesophageal reflux disease)   . HLD  (hyperlipidemia)   . HTN (hypertension)   . Internal hemorrhoids   . Obesity   . Pulmonary fibrosis [facility])    Patient Active Problem List   Diagnosis Date Noted  . Cough 05/28/2016  . Physical deconditioning 04/20/2016  . Weakness generalized   . Dysphagia   . Pyrexia   . Sepsis ([place]) 09/05/2015  . Palliative care encounter 09/03/2015  . DNR (do not resuscitate) 09/03/2015  . Bronchitis, chronic obstructive w acute bronchitis ([place]) 09/02/2015  . HCAP (healthcare-associated pneumonia) 09/01/2015  . Dyspnea 08/31/2015  . Pain   . Depression 08/22/2015  . Chronic hyponatremia 08/03/2015  . Chronic diastolic CHF (congestive heart failure) ([place]) 08/01/2015  . SOB (shortness of breath) 08/01/2015  . Chronic respiratory failure with hypoxia ([place]) 07/28/2015  . Obesity 07/01/2015  . Hyperlipidemia 09/17/2014  . Coronary artery disease 08/17/2014  . Leukocytosis 08/17/2014  . Protein-calorie malnutrition ([place]) 08/17/2014  . Postinflammatory pulmonary fibrosis ([place]) 08/16/2014  . Change in bowel habits 07/24/2012  . Special screening for malignant neoplasms, colon 12/08/2011  . Stricture and stenosis of esophagus 12/08/2011  . NEOPLASM, MALIGNANT, RIGHT BREAST 11/28/2010  . OBSTRUCTIVE SLEEP APNEA 11/28/2010  . Essential hypertension 11/28/2010  . ALLERGIC RHINITIS 11/28/2010  . GERD 11/28/2010   Past Surgical History:  Procedure Laterality Date  . ABDOMINAL HYSTERECTOMY    . APPENDECTOMY    . BREAST SURGERY    . CATARACT EXTRACTION Left 02/2016  . CHOLECYSTECTOMY    . FOOT SURGERY    .  MASTECTOMY     right   OB History    No data available     Home Medications    Prior to Admission medications   Medication Sig Start Date End Date Taking? Authorizing Provider  cyclobenzaprine (FLEXERIL) 5 MG tablet Take 1 tablet (5 mg total) by mouth 2 (two) times daily as needed for muscle spasms. 07/28/16   Gwenyth Allegra Tegeler, MD  esomeprazole (NEXIUM) 20 MG capsule Take 40 mg by  mouth every morning.     Historical Provider, MD  famotidine (PEPCID) 20 MG tablet One at bedtime Patient taking differently: Take 20 mg by mouth at bedtime. One at bedtime 05/26/16   Tanda Rockers, MD  fluticasone [facility]) 50 MCG/ACT nasal spray Place 2 sprays into both nostrils 2 (two) times daily as needed for allergies or rhinitis.     Historical Provider, MD  furosemide (LASIX) 40 MG tablet Take 1 tablet (40 mg total) by mouth every morning. 07/11/16   Binnie Rail, MD  LORazepam (ATIVAN) 0.5 MG tablet TK 1/2 T PO Q 4 H PRN FOR ANXIETY OR SHORTNESS OF BREATH 07/25/16   Historical Provider, MD  Magnesium 250 MG TABS Take 250 mg by mouth at bedtime.     Historical Provider, MD  Morphine Sulfate (MORPHINE CONCENTRATE) 10 mg / 0.5 ml concentrated solution TK 0.125 ML  PO Q 4 H PRN PAIN 06/16/16   Historical Provider, MD  MUCUS RELIEF ER 600 MG 12 hr tablet Take 1 tablet by mouth in the am and Take 2 tablets at qhs 07/18/16   Historical Provider, MD  OXYGEN Oxygen 3lpm 24/7 DME- [place] Provider, MD  polyethylene glycol (MIRALAX / GLYCOLAX) packet Take 17 g by mouth daily as needed. Patient taking differently: Take 17 g by mouth daily as needed for mild constipation.  09/10/15   Domenic Polite, MD  potassium chloride SA (K-DUR,KLOR-CON) 20 MEQ tablet Take 1 tablet (20 mEq total) by mouth daily. 06/01/16   Binnie Rail, MD  predniSONE (DELTASONE) 10 MG tablet 20 mg daily Patient taking differently: Take 10 mg by mouth daily with breakfast. Take along with 20 mg tablet=30 mg daily. 01/07/16   Tanda Rockers, MD  predniSONE (DELTASONE) 20 MG tablet Take 20 mg by mouth daily with breakfast. Take along with 10 mg tablet=30 mg daily 05/26/16   Historical Provider, MD  sertraline (ZOLOFT) 100 MG tablet Take 1 tablet (100 mg total) by mouth daily. 07/03/16   Binnie Rail, MD  sodium chloride (OCEAN) 0.65 % SOLN nasal spray Place 1 spray into both nostrils 2 (two) times daily. Patient  taking differently: Place 1 spray into both nostrils daily as needed for congestion.  08/20/14   Marton Redwood, MD   Family History Family History  Problem Relation Age of Onset  . Heart disease Mother   . Cancer Mother     bladder  . Esophageal cancer Brother   . Prostate cancer Brother   . Heart disease Brother   . Cancer Brother     sarcoma  . Stomach cancer Neg Hx    Social History Social History  Substance Use Topics  . Smoking status: Never Smoker  . Smokeless tobacco: Never Used  . Alcohol use 4.2 oz/week    7 Glasses of wine per week     Comment: ocaasional   Allergies   Anesthetics, amide; Atorvastatin; Codeine; Darvocet [propoxyphene n-acetaminophen]; Valsartan; Antihistamines, loratadine-type; and Moxifloxacin  Review of  Systems Review of Systems  Eyes: Negative for visual disturbance.  Musculoskeletal: Positive for myalgias. Negative for back pain and neck pain.  Skin: Positive for wound.  Neurological: Positive for dizziness and weakness. Negative for syncope and numbness.       Negative for paraesthesias.    Physical Exam Updated Vital Signs BP 137/64 (BP Location: Left Arm)   Pulse 99   Temp 98.3 F (36.8 C) (Oral)   Resp 18   SpO2 97%   Physical Exam  Constitutional: She appears well-developed and well-nourished. No distress.  HENT:  Head: Normocephalic.  Mouth/Throat: Oropharynx is clear and moist. No oropharyngeal exudate.  Right periorbital ecchymosis noted. 2.5 cm laceration to the right eyebrow. No active bleeding. Minimal bony tenderness to the area.  Eyes: Conjunctivae and EOM are normal. Pupils are equal, round, and reactive to light. Right eye exhibits no discharge. Left eye exhibits no discharge. No scleral icterus.  Neck: Normal range of motion. Neck supple. No JVD present. No thyromegaly present.  Left lateral neck tenderness to palpation. No midline spinal tenderness.   Cardiovascular: Normal rate, regular rhythm, normal heart sounds  and intact distal pulses.  Exam reveals no gallop and no friction rub.   No murmur heard. Pulmonary/Chest: Effort normal and breath sounds normal. No respiratory distress. She has no wheezes. She has no rales.  Abdominal: Soft. Bowel sounds are normal. She exhibits no distension and no mass. There is no tenderness.  Musculoskeletal: Normal range of motion. She exhibits no edema or tenderness.  Lymphadenopathy:    She has no cervical adenopathy.  Neurological: She is alert. Coordination normal.  Skin: Skin is warm and dry. Laceration noted. No rash noted. No erythema.  Psychiatric: She has a normal mood and affect. Her behavior is normal.  Nursing note and vitals reviewed.  ED Treatments / Results  DIAGNOSTIC STUDIES: Oxygen Saturation is 97% on RA, normal by my interpretation.   COORDINATION OF CARE: 6:57 PM-Discussed next steps with pt. Pt verbalized understanding and is agreeable with the plan.   Labs (all labs ordered are listed, but only abnormal results are displayed) Labs Reviewed - No data to display  EKG  EKG Interpretation None      Radiology No results found.   Ct Head Wo Contrast  Result Date: 08/03/2016 CLINICAL DATA:  82 year old female post fall. No loss of consciousness. On blood thinners. Initial encounter. EXAM: CT HEAD WITHOUT CONTRAST TECHNIQUE: Contiguous axial images were obtained from the base of the skull through the vertex without intravenous contrast. COMPARISON:  None. FINDINGS: Brain: No intracranial hemorrhage or CT evidence of large acute infarct. No hydrocephalus. No intracranial mass lesion noted on this unenhanced exam. Vascular: Carotid calcifications. Skull: No skull fracture. Sinuses/Orbits: Post lens replacement. Globes appear to be intact. Visualized sinuses and mastoid air cells are clear. Other: Prominent hematoma/ laceration right preseptal position. IMPRESSION: Prominent hematoma/ laceration right preseptal position. The underlying globe  appears grossly intact. No underlying fracture or intracranial hemorrhage. Electronically Signed   By: Genia Del M.D.   On: 08/03/2016 20:35   Procedures Procedures (including critical care time)  LACERATION REPAIR Performed by: [name] Authorized by: [name] Consent: Verbal consent obtained. Risks and benefits: risks, benefits and alternatives were discussed Consent given by: patient Patient identity confirmed: provided demographic data Prepped and Draped in normal sterile fashion Wound explored  Laceration Location: face  Laceration Length: 2.5 cm  No Foreign Bodies seen or palpated  Anesthesia: local infiltration  Local anesthetic: lidocaine  1% with epinephrine  Anesthetic total: 1.5 ml  Irrigation method: syringe  Amount of cleaning: standard  Skin closure: 6-0 prolene  Number of sutures: 4  Technique: simple interrupted  Patient tolerance: Patient tolerated the procedure well with no immediate complications.  Medications Ordered in ED Medications - No data to display  Initial Impression / Assessment and Plan / ED Course  I have reviewed the triage vital signs and the nursing notes.  Pertinent labs & imaging results that were available during my care of the patient were reviewed by me and considered in my medical decision making (see chart for details).  Clinical Course   82 year old female with a head wound after being struck and knocked to the ground. Imaging negative. Laceration was repaired. She is at her baseline mental status. She reports tetanus is current.  Final Clinical Impressions(s) / ED Diagnoses   Final diagnoses:  Facial laceration, initial encounter    New Prescriptions New Prescriptions   No medications on file   I personally preformed the services scribed in my presence. The recorded information has been reviewed is accurate. [name], MD.     [name], MD 08/08/16 1010

## 2016-08-03 NOTE — ED Triage Notes (Addendum)
Per EMS pt was at [facility] for dinner, opened the door and let go of it, door swung back and knocked her to her bottom. No LOC. Pt has small swelling and laceration. Pt also states her left shoulder is hurting, was seen her recently for it.

## 2016-08-03 NOTE — ED Notes (Signed)
Pt has personal oxygen tank at bedside.

## 2016-08-03 NOTE — Assessment & Plan Note (Signed)
Symptoms and exam consistent with possible rotator cuff tear. Patient declines cortisone injection. Not likely a surgical candidate given other comorbid conditions. Recommend ice and home exercise therapy. Continue pain medication as prescribed by hospice. Sling as needed for comfort. Follow-up as needed or if symptoms worsen.

## 2016-08-04 DIAGNOSIS — Z23 Encounter for immunization: Secondary | ICD-10-CM

## 2016-08-06 DIAGNOSIS — Z853 Personal history of malignant neoplasm of breast: Secondary | ICD-10-CM | POA: Diagnosis not present

## 2016-08-06 DIAGNOSIS — I110 Hypertensive heart disease with heart failure: Secondary | ICD-10-CM | POA: Diagnosis not present

## 2016-08-06 DIAGNOSIS — J8410 Pulmonary fibrosis, unspecified: Secondary | ICD-10-CM | POA: Diagnosis not present

## 2016-08-06 DIAGNOSIS — I2510 Atherosclerotic heart disease of native coronary artery without angina pectoris: Secondary | ICD-10-CM | POA: Diagnosis not present

## 2016-08-06 DIAGNOSIS — I5032 Chronic diastolic (congestive) heart failure: Secondary | ICD-10-CM | POA: Diagnosis not present

## 2016-08-06 DIAGNOSIS — K219 Gastro-esophageal reflux disease without esophagitis: Secondary | ICD-10-CM | POA: Diagnosis not present

## 2016-08-06 DIAGNOSIS — Z9981 Dependence on supplemental oxygen: Secondary | ICD-10-CM | POA: Diagnosis not present

## 2016-08-07 DIAGNOSIS — I5032 Chronic diastolic (congestive) heart failure: Secondary | ICD-10-CM | POA: Diagnosis not present

## 2016-08-07 DIAGNOSIS — I2510 Atherosclerotic heart disease of native coronary artery without angina pectoris: Secondary | ICD-10-CM | POA: Diagnosis not present

## 2016-08-07 DIAGNOSIS — Z853 Personal history of malignant neoplasm of breast: Secondary | ICD-10-CM | POA: Diagnosis not present

## 2016-08-07 DIAGNOSIS — Z9981 Dependence on supplemental oxygen: Secondary | ICD-10-CM | POA: Diagnosis not present

## 2016-08-07 DIAGNOSIS — I110 Hypertensive heart disease with heart failure: Secondary | ICD-10-CM | POA: Diagnosis not present

## 2016-08-07 DIAGNOSIS — J8410 Pulmonary fibrosis, unspecified: Secondary | ICD-10-CM | POA: Diagnosis not present

## 2016-08-08 DIAGNOSIS — I2510 Atherosclerotic heart disease of native coronary artery without angina pectoris: Secondary | ICD-10-CM | POA: Diagnosis not present

## 2016-08-08 DIAGNOSIS — I110 Hypertensive heart disease with heart failure: Secondary | ICD-10-CM | POA: Diagnosis not present

## 2016-08-08 DIAGNOSIS — J8410 Pulmonary fibrosis, unspecified: Secondary | ICD-10-CM | POA: Diagnosis not present

## 2016-08-08 DIAGNOSIS — I5032 Chronic diastolic (congestive) heart failure: Secondary | ICD-10-CM | POA: Diagnosis not present

## 2016-08-08 DIAGNOSIS — Z9981 Dependence on supplemental oxygen: Secondary | ICD-10-CM | POA: Diagnosis not present

## 2016-08-08 DIAGNOSIS — Z853 Personal history of malignant neoplasm of breast: Secondary | ICD-10-CM | POA: Diagnosis not present

## 2016-08-14 DIAGNOSIS — Z853 Personal history of malignant neoplasm of breast: Secondary | ICD-10-CM | POA: Diagnosis not present

## 2016-08-14 DIAGNOSIS — I110 Hypertensive heart disease with heart failure: Secondary | ICD-10-CM | POA: Diagnosis not present

## 2016-08-14 DIAGNOSIS — J8410 Pulmonary fibrosis, unspecified: Secondary | ICD-10-CM | POA: Diagnosis not present

## 2016-08-14 DIAGNOSIS — I2510 Atherosclerotic heart disease of native coronary artery without angina pectoris: Secondary | ICD-10-CM | POA: Diagnosis not present

## 2016-08-14 DIAGNOSIS — Z9981 Dependence on supplemental oxygen: Secondary | ICD-10-CM | POA: Diagnosis not present

## 2016-08-14 DIAGNOSIS — I5032 Chronic diastolic (congestive) heart failure: Secondary | ICD-10-CM | POA: Diagnosis not present

## 2016-08-16 ENCOUNTER — Other Ambulatory Visit: Payer: Self-pay | Admitting: Internal Medicine

## 2016-08-16 ENCOUNTER — Telehealth: Payer: Self-pay | Admitting: Emergency Medicine

## 2016-08-16 DIAGNOSIS — I2510 Atherosclerotic heart disease of native coronary artery without angina pectoris: Secondary | ICD-10-CM | POA: Diagnosis not present

## 2016-08-16 DIAGNOSIS — Z853 Personal history of malignant neoplasm of breast: Secondary | ICD-10-CM | POA: Diagnosis not present

## 2016-08-16 DIAGNOSIS — I110 Hypertensive heart disease with heart failure: Secondary | ICD-10-CM | POA: Diagnosis not present

## 2016-08-16 DIAGNOSIS — I5032 Chronic diastolic (congestive) heart failure: Secondary | ICD-10-CM | POA: Diagnosis not present

## 2016-08-16 DIAGNOSIS — J8410 Pulmonary fibrosis, unspecified: Secondary | ICD-10-CM | POA: Diagnosis not present

## 2016-08-16 DIAGNOSIS — Z9981 Dependence on supplemental oxygen: Secondary | ICD-10-CM | POA: Diagnosis not present

## 2016-08-16 NOTE — Telephone Encounter (Signed)
LVM informing pt

## 2016-08-16 NOTE — Telephone Encounter (Signed)
Please advise 

## 2016-08-16 NOTE — Telephone Encounter (Signed)
LVM informing Colletta Maryland about pts lasix dosage.

## 2016-08-16 NOTE — Telephone Encounter (Signed)
Should be taking 40 mg of lasix every morning.

## 2016-08-16 NOTE — Telephone Encounter (Signed)
Hospice called and the patient has 2 different bottles of lasix. She is currently not taking either one. If the patient is suppose to be taking this medication they need to know and know the correct dosage. Please advise thanks.

## 2016-08-17 ENCOUNTER — Other Ambulatory Visit: Payer: Self-pay | Admitting: Internal Medicine

## 2016-08-17 ENCOUNTER — Telehealth: Payer: Self-pay | Admitting: Internal Medicine

## 2016-08-17 NOTE — Telephone Encounter (Signed)
Please give stephanie a call re: patient. No information past that

## 2016-08-17 NOTE — Telephone Encounter (Signed)
Spoke with Hospice. States she saw pt today and pt state she is having a productive cough. At first pt told RN that it was a white color, then said it had pink lines in it, the proceeded to say that it was a bright red and had been going on for a couple weeks now.   Should pt be seen? Or would you like them to monitor her symptoms. RN is not sure which to go by bc pt changed her answer so many times. She also says that pt told her that she felt like she was getting dementia.   Please advise.

## 2016-08-17 NOTE — Telephone Encounter (Signed)
Spoke with Colletta Maryland with hospice to inform.

## 2016-08-17 NOTE — Telephone Encounter (Signed)
Monitor for now. At this point I would not recommend doing anything invasive. If symptoms persist she should be evaluated.

## 2016-08-18 DIAGNOSIS — Z9981 Dependence on supplemental oxygen: Secondary | ICD-10-CM | POA: Diagnosis not present

## 2016-08-18 DIAGNOSIS — J8410 Pulmonary fibrosis, unspecified: Secondary | ICD-10-CM | POA: Diagnosis not present

## 2016-08-18 DIAGNOSIS — I5032 Chronic diastolic (congestive) heart failure: Secondary | ICD-10-CM | POA: Diagnosis not present

## 2016-08-18 DIAGNOSIS — I2510 Atherosclerotic heart disease of native coronary artery without angina pectoris: Secondary | ICD-10-CM | POA: Diagnosis not present

## 2016-08-18 DIAGNOSIS — I110 Hypertensive heart disease with heart failure: Secondary | ICD-10-CM | POA: Diagnosis not present

## 2016-08-18 DIAGNOSIS — Z853 Personal history of malignant neoplasm of breast: Secondary | ICD-10-CM | POA: Diagnosis not present

## 2016-08-21 DIAGNOSIS — I5032 Chronic diastolic (congestive) heart failure: Secondary | ICD-10-CM | POA: Diagnosis not present

## 2016-08-21 DIAGNOSIS — Z853 Personal history of malignant neoplasm of breast: Secondary | ICD-10-CM | POA: Diagnosis not present

## 2016-08-21 DIAGNOSIS — J8410 Pulmonary fibrosis, unspecified: Secondary | ICD-10-CM | POA: Diagnosis not present

## 2016-08-21 DIAGNOSIS — I2510 Atherosclerotic heart disease of native coronary artery without angina pectoris: Secondary | ICD-10-CM | POA: Diagnosis not present

## 2016-08-21 DIAGNOSIS — Z9981 Dependence on supplemental oxygen: Secondary | ICD-10-CM | POA: Diagnosis not present

## 2016-08-21 DIAGNOSIS — I110 Hypertensive heart disease with heart failure: Secondary | ICD-10-CM | POA: Diagnosis not present

## 2016-08-23 DIAGNOSIS — I5032 Chronic diastolic (congestive) heart failure: Secondary | ICD-10-CM | POA: Diagnosis not present

## 2016-08-23 DIAGNOSIS — J8410 Pulmonary fibrosis, unspecified: Secondary | ICD-10-CM | POA: Diagnosis not present

## 2016-08-23 DIAGNOSIS — Z9981 Dependence on supplemental oxygen: Secondary | ICD-10-CM | POA: Diagnosis not present

## 2016-08-23 DIAGNOSIS — I2510 Atherosclerotic heart disease of native coronary artery without angina pectoris: Secondary | ICD-10-CM | POA: Diagnosis not present

## 2016-08-23 DIAGNOSIS — Z853 Personal history of malignant neoplasm of breast: Secondary | ICD-10-CM | POA: Diagnosis not present

## 2016-08-23 DIAGNOSIS — I110 Hypertensive heart disease with heart failure: Secondary | ICD-10-CM | POA: Diagnosis not present

## 2016-08-24 NOTE — Progress Notes (Deleted)
Subjective:    Patient ID: [name], female    DOB: 12/04/1927, 82 y.o.   MRN: [id number]  HPI The patient is here for follow up.  Pulmonary fibrosis with chronic respiratory failure with hypoxia:  She is on daily prednisone and oxygen 24/7.    Depression: She is taking her medication daily as prescribed. She denies any side effects from the medication. She feels her depression is well controlled and she is happy with her current dose of medication.   Chronic diastolic heart failure, hypertension:  GERD:  She is taking her medication daily as prescribed.  She denies any GERD symptoms and feels her GERD is well controlled.    Medications and allergies reviewed with patient and updated if appropriate.  Patient Active Problem List   Diagnosis Date Noted  . Left shoulder pain 08/03/2016  . Cough 05/28/2016  . Physical deconditioning 04/20/2016  . Weakness generalized   . Dysphagia   . Pyrexia   . Sepsis ([place]) 09/05/2015  . Palliative care encounter 09/03/2015  . DNR (do not resuscitate) 09/03/2015  . Bronchitis, chronic obstructive w acute bronchitis ([place]) 09/02/2015  . HCAP (healthcare-associated pneumonia) 09/01/2015  . Dyspnea 08/31/2015  . Pain   . Depression 08/22/2015  . Chronic hyponatremia 08/03/2015  . Chronic diastolic CHF (congestive heart failure) ([place]) 08/01/2015  . SOB (shortness of breath) 08/01/2015  . Chronic respiratory failure with hypoxia ([place]) 07/28/2015  . Obesity 07/01/2015  . Hyperlipidemia 09/17/2014  . Coronary artery disease 08/17/2014  . Leukocytosis 08/17/2014  . Protein-calorie malnutrition ([place]) 08/17/2014  . Postinflammatory pulmonary fibrosis ([place]) 08/16/2014  . Change in bowel habits 07/24/2012  . Special screening for malignant neoplasms, colon 12/08/2011  . Stricture and stenosis of esophagus 12/08/2011  . NEOPLASM, MALIGNANT, RIGHT BREAST 11/28/2010  . OBSTRUCTIVE SLEEP APNEA 11/28/2010  . Essential hypertension 11/28/2010    . ALLERGIC RHINITIS 11/28/2010  . GERD 11/28/2010    Current Outpatient Prescriptions on File Prior to Visit  Medication Sig Dispense Refill  . amoxicillin-clavulanate (AUGMENTIN) 500-125 MG tablet TK 1 T PO Q 12 H FOR SEVEN DAYS FOR UTI  0  . aspirin 325 MG tablet TK 1 T PO QD  3  . cyclobenzaprine (FLEXERIL) 5 MG tablet Take 1 tablet (5 mg total) by mouth 2 (two) times daily as needed for muscle spasms. 6 tablet 0  . famotidine (PEPCID) 20 MG tablet One at bedtime (Patient not taking: Reported on 08/03/2016)    . fluticasone (FLONASE) 50 MCG/ACT nasal spray Place 2 sprays into both nostrils 2 (two) times daily as needed for allergies or rhinitis.     . furosemide (LASIX) 40 MG tablet Take 1 tablet (40 mg total) by mouth every morning. 30 tablet 2  . LORazepam (ATIVAN) 0.5 MG tablet TK 1/2 T PO Q 4 H PRN FOR ANXIETY OR SHORTNESS OF BREATH  3  . Magnesium 250 MG TABS Take 250 mg by mouth at bedtime.     . Morphine Sulfate (MORPHINE CONCENTRATE) 10 mg / 0.5 ml concentrated solution TK 0.125 ML  PO Q 4 H PRN PAIN  0  . MUCUS RELIEF ER 600 MG 12 hr tablet Take 1 tablet by mouth in the am and Take 2 tablets at qhs  3  . NEXIUM 24HR 20 MG capsule TAKE 1 CAPSULE BY MOUTH TWICE DAILY 60 capsule 0  . OXYGEN Oxygen 3lpm 24/7 DME- [facility]    . polyethylene glycol (MIRALAX / GLYCOLAX) packet Take  17 g by mouth daily as needed. (Patient taking differently: Take 17 g by mouth daily as needed for mild constipation. ) 14 each 0  . potassium chloride SA (K-DUR,KLOR-CON) 20 MEQ tablet Take 1 tablet (20 mEq total) by mouth daily. 30 tablet 5  . predniSONE (DELTASONE) 10 MG tablet 20 mg daily (Patient taking differently: Take 10 mg by mouth daily with breakfast. Take along with 20 mg tablet=30 mg daily.) 60 tablet 1  . predniSONE (DELTASONE) 20 MG tablet Take 20 mg by mouth daily with breakfast. Take along with 10 mg tablet=30 mg daily  3  . sertraline (ZOLOFT) 100 MG tablet Take 1 tablet (100 mg  total) by mouth daily. 30 tablet 2  . sodium chloride (OCEAN) 0.65 % SOLN nasal spray Place 1 spray into both nostrils 2 (two) times daily. (Patient taking differently: Place 1 spray into both nostrils daily as needed for congestion. )  0   No current facility-administered medications on file prior to visit.     Past Medical History:  Diagnosis Date  . Arthritis   . Breast cancer ([place]) 1969  . CAD (coronary artery disease)   . Chronic diastolic CHF (congestive heart failure) ([place]) 08/01/2015  . Colon polyp 2013   TUBULAR ADENOMA (X1  . Depression   . Diverticulosis 2008  . Dyspnea   . Esophageal stricture   . Gallstones   . GERD (gastroesophageal reflux disease)   . HLD (hyperlipidemia)   . HTN (hypertension)   . Internal hemorrhoids   . Obesity   . Pulmonary fibrosis ([place])     Past Surgical History:  Procedure Laterality Date  . ABDOMINAL HYSTERECTOMY    . APPENDECTOMY    . BREAST SURGERY    . CATARACT EXTRACTION Left 02/2016  . CHOLECYSTECTOMY    . FOOT SURGERY    . MASTECTOMY     right    Social History   Social History  . Marital status: Married    Spouse name: N/A  . Number of children: 0  . Years of education: N/A   Occupational History  . retired Retired   Social History Main Topics  . Smoking status: Never Smoker  . Smokeless tobacco: Never Used  . Alcohol use 4.2 oz/week    7 Glasses of wine per week     Comment: ocaasional  . Drug use: No  . Sexual activity: Not on file   Other Topics Concern  . Not on file   Social History Narrative  . No narrative on file    Family History  Problem Relation Age of Onset  . Heart disease Mother   . Cancer Mother     bladder  . Esophageal cancer Brother   . Prostate cancer Brother   . Heart disease Brother   . Cancer Brother     sarcoma  . Stomach cancer Neg Hx     Review of Systems     Objective:  There were no vitals filed for this visit. There were no vitals filed for this visit. There is  no height or weight on file to calculate BMI.   Physical Exam    Constitutional: Appears well-developed and well-nourished. No distress.  HENT:  Head: Normocephalic and atraumatic.  Neck: Neck supple. No tracheal deviation present. No thyromegaly present.  No cervical lymphadenopathy Cardiovascular: Normal rate, regular rhythm and normal heart sounds.   No murmur heard. No carotid bruit .  No edema Pulmonary/Chest: Effort normal and breath sounds normal. No  respiratory distress. No has no wheezes. No rales.  Skin: Skin is warm and dry. Not diaphoretic.  Psychiatric: Normal mood and affect. Behavior is normal.      Assessment & Plan:    See Problem List for Assessment and Plan of chronic medical problems.

## 2016-08-25 ENCOUNTER — Ambulatory Visit: Admitting: Internal Medicine

## 2016-08-25 DIAGNOSIS — I5032 Chronic diastolic (congestive) heart failure: Secondary | ICD-10-CM | POA: Diagnosis not present

## 2016-08-25 DIAGNOSIS — I2510 Atherosclerotic heart disease of native coronary artery without angina pectoris: Secondary | ICD-10-CM | POA: Diagnosis not present

## 2016-08-25 DIAGNOSIS — Z853 Personal history of malignant neoplasm of breast: Secondary | ICD-10-CM | POA: Diagnosis not present

## 2016-08-25 DIAGNOSIS — J8410 Pulmonary fibrosis, unspecified: Secondary | ICD-10-CM | POA: Diagnosis not present

## 2016-08-25 DIAGNOSIS — Z9981 Dependence on supplemental oxygen: Secondary | ICD-10-CM | POA: Diagnosis not present

## 2016-08-25 DIAGNOSIS — I110 Hypertensive heart disease with heart failure: Secondary | ICD-10-CM | POA: Diagnosis not present

## 2016-08-28 ENCOUNTER — Ambulatory Visit: Payer: Medicare Other | Admitting: Internal Medicine

## 2016-08-28 DIAGNOSIS — I110 Hypertensive heart disease with heart failure: Secondary | ICD-10-CM | POA: Diagnosis not present

## 2016-08-28 DIAGNOSIS — Z853 Personal history of malignant neoplasm of breast: Secondary | ICD-10-CM | POA: Diagnosis not present

## 2016-08-28 DIAGNOSIS — I5032 Chronic diastolic (congestive) heart failure: Secondary | ICD-10-CM | POA: Diagnosis not present

## 2016-08-28 DIAGNOSIS — J8410 Pulmonary fibrosis, unspecified: Secondary | ICD-10-CM | POA: Diagnosis not present

## 2016-08-28 DIAGNOSIS — I2510 Atherosclerotic heart disease of native coronary artery without angina pectoris: Secondary | ICD-10-CM | POA: Diagnosis not present

## 2016-08-28 DIAGNOSIS — Z9981 Dependence on supplemental oxygen: Secondary | ICD-10-CM | POA: Diagnosis not present

## 2016-08-30 ENCOUNTER — Ambulatory Visit: Payer: Medicare Other

## 2016-08-30 DIAGNOSIS — J8410 Pulmonary fibrosis, unspecified: Secondary | ICD-10-CM | POA: Diagnosis not present

## 2016-08-30 DIAGNOSIS — Z853 Personal history of malignant neoplasm of breast: Secondary | ICD-10-CM | POA: Diagnosis not present

## 2016-08-30 DIAGNOSIS — Z9981 Dependence on supplemental oxygen: Secondary | ICD-10-CM | POA: Diagnosis not present

## 2016-08-30 DIAGNOSIS — I2510 Atherosclerotic heart disease of native coronary artery without angina pectoris: Secondary | ICD-10-CM | POA: Diagnosis not present

## 2016-08-30 DIAGNOSIS — I5032 Chronic diastolic (congestive) heart failure: Secondary | ICD-10-CM | POA: Diagnosis not present

## 2016-08-30 DIAGNOSIS — I110 Hypertensive heart disease with heart failure: Secondary | ICD-10-CM | POA: Diagnosis not present

## 2016-08-31 DIAGNOSIS — Z853 Personal history of malignant neoplasm of breast: Secondary | ICD-10-CM | POA: Diagnosis not present

## 2016-08-31 DIAGNOSIS — Z9981 Dependence on supplemental oxygen: Secondary | ICD-10-CM | POA: Diagnosis not present

## 2016-08-31 DIAGNOSIS — I5032 Chronic diastolic (congestive) heart failure: Secondary | ICD-10-CM | POA: Diagnosis not present

## 2016-08-31 DIAGNOSIS — J8410 Pulmonary fibrosis, unspecified: Secondary | ICD-10-CM | POA: Diagnosis not present

## 2016-08-31 DIAGNOSIS — I110 Hypertensive heart disease with heart failure: Secondary | ICD-10-CM | POA: Diagnosis not present

## 2016-08-31 DIAGNOSIS — I2510 Atherosclerotic heart disease of native coronary artery without angina pectoris: Secondary | ICD-10-CM | POA: Diagnosis not present

## 2016-09-01 DIAGNOSIS — Z853 Personal history of malignant neoplasm of breast: Secondary | ICD-10-CM | POA: Diagnosis not present

## 2016-09-01 DIAGNOSIS — Z9981 Dependence on supplemental oxygen: Secondary | ICD-10-CM | POA: Diagnosis not present

## 2016-09-01 DIAGNOSIS — J8410 Pulmonary fibrosis, unspecified: Secondary | ICD-10-CM | POA: Diagnosis not present

## 2016-09-01 DIAGNOSIS — I2510 Atherosclerotic heart disease of native coronary artery without angina pectoris: Secondary | ICD-10-CM | POA: Diagnosis not present

## 2016-09-01 DIAGNOSIS — I110 Hypertensive heart disease with heart failure: Secondary | ICD-10-CM | POA: Diagnosis not present

## 2016-09-01 DIAGNOSIS — I5032 Chronic diastolic (congestive) heart failure: Secondary | ICD-10-CM | POA: Diagnosis not present

## 2016-09-04 DIAGNOSIS — I5032 Chronic diastolic (congestive) heart failure: Secondary | ICD-10-CM | POA: Diagnosis not present

## 2016-09-04 DIAGNOSIS — J8410 Pulmonary fibrosis, unspecified: Secondary | ICD-10-CM | POA: Diagnosis not present

## 2016-09-04 DIAGNOSIS — Z853 Personal history of malignant neoplasm of breast: Secondary | ICD-10-CM | POA: Diagnosis not present

## 2016-09-04 DIAGNOSIS — Z9981 Dependence on supplemental oxygen: Secondary | ICD-10-CM | POA: Diagnosis not present

## 2016-09-04 DIAGNOSIS — I110 Hypertensive heart disease with heart failure: Secondary | ICD-10-CM | POA: Diagnosis not present

## 2016-09-04 DIAGNOSIS — I2510 Atherosclerotic heart disease of native coronary artery without angina pectoris: Secondary | ICD-10-CM | POA: Diagnosis not present

## 2016-09-06 ENCOUNTER — Other Ambulatory Visit: Payer: Self-pay | Admitting: Internal Medicine

## 2016-09-06 DIAGNOSIS — J8410 Pulmonary fibrosis, unspecified: Secondary | ICD-10-CM | POA: Diagnosis not present

## 2016-09-06 DIAGNOSIS — K219 Gastro-esophageal reflux disease without esophagitis: Secondary | ICD-10-CM | POA: Diagnosis not present

## 2016-09-06 DIAGNOSIS — I2510 Atherosclerotic heart disease of native coronary artery without angina pectoris: Secondary | ICD-10-CM | POA: Diagnosis not present

## 2016-09-06 DIAGNOSIS — Z853 Personal history of malignant neoplasm of breast: Secondary | ICD-10-CM | POA: Diagnosis not present

## 2016-09-06 DIAGNOSIS — I110 Hypertensive heart disease with heart failure: Secondary | ICD-10-CM | POA: Diagnosis not present

## 2016-09-06 DIAGNOSIS — Z9981 Dependence on supplemental oxygen: Secondary | ICD-10-CM | POA: Diagnosis not present

## 2016-09-06 DIAGNOSIS — I5032 Chronic diastolic (congestive) heart failure: Secondary | ICD-10-CM | POA: Diagnosis not present

## 2016-09-07 DIAGNOSIS — I110 Hypertensive heart disease with heart failure: Secondary | ICD-10-CM | POA: Diagnosis not present

## 2016-09-07 DIAGNOSIS — I5032 Chronic diastolic (congestive) heart failure: Secondary | ICD-10-CM | POA: Diagnosis not present

## 2016-09-07 DIAGNOSIS — Z853 Personal history of malignant neoplasm of breast: Secondary | ICD-10-CM | POA: Diagnosis not present

## 2016-09-07 DIAGNOSIS — I2510 Atherosclerotic heart disease of native coronary artery without angina pectoris: Secondary | ICD-10-CM | POA: Diagnosis not present

## 2016-09-07 DIAGNOSIS — J8410 Pulmonary fibrosis, unspecified: Secondary | ICD-10-CM | POA: Diagnosis not present

## 2016-09-07 DIAGNOSIS — Z9981 Dependence on supplemental oxygen: Secondary | ICD-10-CM | POA: Diagnosis not present

## 2016-09-08 DIAGNOSIS — I110 Hypertensive heart disease with heart failure: Secondary | ICD-10-CM | POA: Diagnosis not present

## 2016-09-08 DIAGNOSIS — Z9981 Dependence on supplemental oxygen: Secondary | ICD-10-CM | POA: Diagnosis not present

## 2016-09-08 DIAGNOSIS — I5032 Chronic diastolic (congestive) heart failure: Secondary | ICD-10-CM | POA: Diagnosis not present

## 2016-09-08 DIAGNOSIS — J8410 Pulmonary fibrosis, unspecified: Secondary | ICD-10-CM | POA: Diagnosis not present

## 2016-09-08 DIAGNOSIS — I2510 Atherosclerotic heart disease of native coronary artery without angina pectoris: Secondary | ICD-10-CM | POA: Diagnosis not present

## 2016-09-08 DIAGNOSIS — Z853 Personal history of malignant neoplasm of breast: Secondary | ICD-10-CM | POA: Diagnosis not present

## 2016-09-11 DIAGNOSIS — I110 Hypertensive heart disease with heart failure: Secondary | ICD-10-CM | POA: Diagnosis not present

## 2016-09-11 DIAGNOSIS — Z9981 Dependence on supplemental oxygen: Secondary | ICD-10-CM | POA: Diagnosis not present

## 2016-09-11 DIAGNOSIS — I2510 Atherosclerotic heart disease of native coronary artery without angina pectoris: Secondary | ICD-10-CM | POA: Diagnosis not present

## 2016-09-11 DIAGNOSIS — Z853 Personal history of malignant neoplasm of breast: Secondary | ICD-10-CM | POA: Diagnosis not present

## 2016-09-11 DIAGNOSIS — I5032 Chronic diastolic (congestive) heart failure: Secondary | ICD-10-CM | POA: Diagnosis not present

## 2016-09-11 DIAGNOSIS — J8410 Pulmonary fibrosis, unspecified: Secondary | ICD-10-CM | POA: Diagnosis not present

## 2016-09-12 DIAGNOSIS — I5032 Chronic diastolic (congestive) heart failure: Secondary | ICD-10-CM | POA: Diagnosis not present

## 2016-09-12 DIAGNOSIS — I2510 Atherosclerotic heart disease of native coronary artery without angina pectoris: Secondary | ICD-10-CM | POA: Diagnosis not present

## 2016-09-12 DIAGNOSIS — Z853 Personal history of malignant neoplasm of breast: Secondary | ICD-10-CM | POA: Diagnosis not present

## 2016-09-12 DIAGNOSIS — Z9981 Dependence on supplemental oxygen: Secondary | ICD-10-CM | POA: Diagnosis not present

## 2016-09-12 DIAGNOSIS — J8410 Pulmonary fibrosis, unspecified: Secondary | ICD-10-CM | POA: Diagnosis not present

## 2016-09-12 DIAGNOSIS — I110 Hypertensive heart disease with heart failure: Secondary | ICD-10-CM | POA: Diagnosis not present

## 2016-09-13 DIAGNOSIS — I110 Hypertensive heart disease with heart failure: Secondary | ICD-10-CM | POA: Diagnosis not present

## 2016-09-13 DIAGNOSIS — I2510 Atherosclerotic heart disease of native coronary artery without angina pectoris: Secondary | ICD-10-CM | POA: Diagnosis not present

## 2016-09-13 DIAGNOSIS — J8410 Pulmonary fibrosis, unspecified: Secondary | ICD-10-CM | POA: Diagnosis not present

## 2016-09-13 DIAGNOSIS — Z9981 Dependence on supplemental oxygen: Secondary | ICD-10-CM | POA: Diagnosis not present

## 2016-09-13 DIAGNOSIS — Z853 Personal history of malignant neoplasm of breast: Secondary | ICD-10-CM | POA: Diagnosis not present

## 2016-09-13 DIAGNOSIS — I5032 Chronic diastolic (congestive) heart failure: Secondary | ICD-10-CM | POA: Diagnosis not present

## 2016-09-14 ENCOUNTER — Telehealth: Payer: Self-pay | Admitting: Internal Medicine

## 2016-09-14 NOTE — Telephone Encounter (Signed)
Spoke with Colletta [place] to give verbals.

## 2016-09-14 NOTE — Telephone Encounter (Signed)
[name] called request order for continue care for Hospice service. Please give her a call back

## 2016-09-14 NOTE — Telephone Encounter (Signed)
ok 

## 2016-09-15 DIAGNOSIS — J8410 Pulmonary fibrosis, unspecified: Secondary | ICD-10-CM | POA: Diagnosis not present

## 2016-09-15 DIAGNOSIS — I110 Hypertensive heart disease with heart failure: Secondary | ICD-10-CM | POA: Diagnosis not present

## 2016-09-15 DIAGNOSIS — Z9981 Dependence on supplemental oxygen: Secondary | ICD-10-CM | POA: Diagnosis not present

## 2016-09-15 DIAGNOSIS — Z853 Personal history of malignant neoplasm of breast: Secondary | ICD-10-CM | POA: Diagnosis not present

## 2016-09-15 DIAGNOSIS — I2510 Atherosclerotic heart disease of native coronary artery without angina pectoris: Secondary | ICD-10-CM | POA: Diagnosis not present

## 2016-09-15 DIAGNOSIS — I5032 Chronic diastolic (congestive) heart failure: Secondary | ICD-10-CM | POA: Diagnosis not present

## 2016-09-18 DIAGNOSIS — I5032 Chronic diastolic (congestive) heart failure: Secondary | ICD-10-CM | POA: Diagnosis not present

## 2016-09-18 DIAGNOSIS — Z853 Personal history of malignant neoplasm of breast: Secondary | ICD-10-CM | POA: Diagnosis not present

## 2016-09-18 DIAGNOSIS — Z9981 Dependence on supplemental oxygen: Secondary | ICD-10-CM | POA: Diagnosis not present

## 2016-09-18 DIAGNOSIS — I110 Hypertensive heart disease with heart failure: Secondary | ICD-10-CM | POA: Diagnosis not present

## 2016-09-18 DIAGNOSIS — I2510 Atherosclerotic heart disease of native coronary artery without angina pectoris: Secondary | ICD-10-CM | POA: Diagnosis not present

## 2016-09-18 DIAGNOSIS — J8410 Pulmonary fibrosis, unspecified: Secondary | ICD-10-CM | POA: Diagnosis not present

## 2016-09-19 DIAGNOSIS — I5032 Chronic diastolic (congestive) heart failure: Secondary | ICD-10-CM | POA: Diagnosis not present

## 2016-09-19 DIAGNOSIS — J8410 Pulmonary fibrosis, unspecified: Secondary | ICD-10-CM | POA: Diagnosis not present

## 2016-09-19 DIAGNOSIS — I110 Hypertensive heart disease with heart failure: Secondary | ICD-10-CM | POA: Diagnosis not present

## 2016-09-19 DIAGNOSIS — I2510 Atherosclerotic heart disease of native coronary artery without angina pectoris: Secondary | ICD-10-CM | POA: Diagnosis not present

## 2016-09-19 DIAGNOSIS — Z853 Personal history of malignant neoplasm of breast: Secondary | ICD-10-CM | POA: Diagnosis not present

## 2016-09-19 DIAGNOSIS — Z9981 Dependence on supplemental oxygen: Secondary | ICD-10-CM | POA: Diagnosis not present

## 2016-09-20 DIAGNOSIS — I2510 Atherosclerotic heart disease of native coronary artery without angina pectoris: Secondary | ICD-10-CM | POA: Diagnosis not present

## 2016-09-20 DIAGNOSIS — Z9981 Dependence on supplemental oxygen: Secondary | ICD-10-CM | POA: Diagnosis not present

## 2016-09-20 DIAGNOSIS — I110 Hypertensive heart disease with heart failure: Secondary | ICD-10-CM | POA: Diagnosis not present

## 2016-09-20 DIAGNOSIS — Z853 Personal history of malignant neoplasm of breast: Secondary | ICD-10-CM | POA: Diagnosis not present

## 2016-09-20 DIAGNOSIS — J8410 Pulmonary fibrosis, unspecified: Secondary | ICD-10-CM | POA: Diagnosis not present

## 2016-09-20 DIAGNOSIS — I5032 Chronic diastolic (congestive) heart failure: Secondary | ICD-10-CM | POA: Diagnosis not present

## 2016-09-22 DIAGNOSIS — J8410 Pulmonary fibrosis, unspecified: Secondary | ICD-10-CM | POA: Diagnosis not present

## 2016-09-22 DIAGNOSIS — I110 Hypertensive heart disease with heart failure: Secondary | ICD-10-CM | POA: Diagnosis not present

## 2016-09-22 DIAGNOSIS — Z853 Personal history of malignant neoplasm of breast: Secondary | ICD-10-CM | POA: Diagnosis not present

## 2016-09-22 DIAGNOSIS — I2510 Atherosclerotic heart disease of native coronary artery without angina pectoris: Secondary | ICD-10-CM | POA: Diagnosis not present

## 2016-09-22 DIAGNOSIS — I5032 Chronic diastolic (congestive) heart failure: Secondary | ICD-10-CM | POA: Diagnosis not present

## 2016-09-22 DIAGNOSIS — Z9981 Dependence on supplemental oxygen: Secondary | ICD-10-CM | POA: Diagnosis not present

## 2016-09-25 DIAGNOSIS — Z853 Personal history of malignant neoplasm of breast: Secondary | ICD-10-CM | POA: Diagnosis not present

## 2016-09-25 DIAGNOSIS — I110 Hypertensive heart disease with heart failure: Secondary | ICD-10-CM | POA: Diagnosis not present

## 2016-09-25 DIAGNOSIS — Z9981 Dependence on supplemental oxygen: Secondary | ICD-10-CM | POA: Diagnosis not present

## 2016-09-25 DIAGNOSIS — I2510 Atherosclerotic heart disease of native coronary artery without angina pectoris: Secondary | ICD-10-CM | POA: Diagnosis not present

## 2016-09-25 DIAGNOSIS — J8410 Pulmonary fibrosis, unspecified: Secondary | ICD-10-CM | POA: Diagnosis not present

## 2016-09-25 DIAGNOSIS — I5032 Chronic diastolic (congestive) heart failure: Secondary | ICD-10-CM | POA: Diagnosis not present

## 2016-09-26 DIAGNOSIS — I5032 Chronic diastolic (congestive) heart failure: Secondary | ICD-10-CM | POA: Diagnosis not present

## 2016-09-26 DIAGNOSIS — Z9981 Dependence on supplemental oxygen: Secondary | ICD-10-CM | POA: Diagnosis not present

## 2016-09-26 DIAGNOSIS — I110 Hypertensive heart disease with heart failure: Secondary | ICD-10-CM | POA: Diagnosis not present

## 2016-09-26 DIAGNOSIS — Z853 Personal history of malignant neoplasm of breast: Secondary | ICD-10-CM | POA: Diagnosis not present

## 2016-09-26 DIAGNOSIS — I2510 Atherosclerotic heart disease of native coronary artery without angina pectoris: Secondary | ICD-10-CM | POA: Diagnosis not present

## 2016-09-26 DIAGNOSIS — J8410 Pulmonary fibrosis, unspecified: Secondary | ICD-10-CM | POA: Diagnosis not present

## 2016-09-27 DIAGNOSIS — I5032 Chronic diastolic (congestive) heart failure: Secondary | ICD-10-CM | POA: Diagnosis not present

## 2016-09-27 DIAGNOSIS — J8410 Pulmonary fibrosis, unspecified: Secondary | ICD-10-CM | POA: Diagnosis not present

## 2016-09-27 DIAGNOSIS — I2510 Atherosclerotic heart disease of native coronary artery without angina pectoris: Secondary | ICD-10-CM | POA: Diagnosis not present

## 2016-09-27 DIAGNOSIS — Z853 Personal history of malignant neoplasm of breast: Secondary | ICD-10-CM | POA: Diagnosis not present

## 2016-09-27 DIAGNOSIS — Z9981 Dependence on supplemental oxygen: Secondary | ICD-10-CM | POA: Diagnosis not present

## 2016-09-27 DIAGNOSIS — I110 Hypertensive heart disease with heart failure: Secondary | ICD-10-CM | POA: Diagnosis not present

## 2016-09-29 DIAGNOSIS — I5032 Chronic diastolic (congestive) heart failure: Secondary | ICD-10-CM | POA: Diagnosis not present

## 2016-09-29 DIAGNOSIS — J8410 Pulmonary fibrosis, unspecified: Secondary | ICD-10-CM | POA: Diagnosis not present

## 2016-09-29 DIAGNOSIS — I2510 Atherosclerotic heart disease of native coronary artery without angina pectoris: Secondary | ICD-10-CM | POA: Diagnosis not present

## 2016-09-29 DIAGNOSIS — Z853 Personal history of malignant neoplasm of breast: Secondary | ICD-10-CM | POA: Diagnosis not present

## 2016-09-29 DIAGNOSIS — Z9981 Dependence on supplemental oxygen: Secondary | ICD-10-CM | POA: Diagnosis not present

## 2016-09-29 DIAGNOSIS — I110 Hypertensive heart disease with heart failure: Secondary | ICD-10-CM | POA: Diagnosis not present

## 2016-10-02 DIAGNOSIS — I2510 Atherosclerotic heart disease of native coronary artery without angina pectoris: Secondary | ICD-10-CM | POA: Diagnosis not present

## 2016-10-02 DIAGNOSIS — Z9981 Dependence on supplemental oxygen: Secondary | ICD-10-CM | POA: Diagnosis not present

## 2016-10-02 DIAGNOSIS — J8410 Pulmonary fibrosis, unspecified: Secondary | ICD-10-CM | POA: Diagnosis not present

## 2016-10-02 DIAGNOSIS — Z853 Personal history of malignant neoplasm of breast: Secondary | ICD-10-CM | POA: Diagnosis not present

## 2016-10-02 DIAGNOSIS — I5032 Chronic diastolic (congestive) heart failure: Secondary | ICD-10-CM | POA: Diagnosis not present

## 2016-10-02 DIAGNOSIS — I110 Hypertensive heart disease with heart failure: Secondary | ICD-10-CM | POA: Diagnosis not present

## 2016-10-03 ENCOUNTER — Other Ambulatory Visit: Payer: Self-pay | Admitting: Internal Medicine

## 2016-10-03 DIAGNOSIS — I2510 Atherosclerotic heart disease of native coronary artery without angina pectoris: Secondary | ICD-10-CM | POA: Diagnosis not present

## 2016-10-03 DIAGNOSIS — Z853 Personal history of malignant neoplasm of breast: Secondary | ICD-10-CM | POA: Diagnosis not present

## 2016-10-03 DIAGNOSIS — J8410 Pulmonary fibrosis, unspecified: Secondary | ICD-10-CM | POA: Diagnosis not present

## 2016-10-03 DIAGNOSIS — Z9981 Dependence on supplemental oxygen: Secondary | ICD-10-CM | POA: Diagnosis not present

## 2016-10-03 DIAGNOSIS — I110 Hypertensive heart disease with heart failure: Secondary | ICD-10-CM | POA: Diagnosis not present

## 2016-10-03 DIAGNOSIS — I5032 Chronic diastolic (congestive) heart failure: Secondary | ICD-10-CM | POA: Diagnosis not present

## 2016-10-03 NOTE — Telephone Encounter (Signed)
Please advise 

## 2016-10-04 DIAGNOSIS — Z853 Personal history of malignant neoplasm of breast: Secondary | ICD-10-CM | POA: Diagnosis not present

## 2016-10-04 DIAGNOSIS — I110 Hypertensive heart disease with heart failure: Secondary | ICD-10-CM | POA: Diagnosis not present

## 2016-10-04 DIAGNOSIS — I5032 Chronic diastolic (congestive) heart failure: Secondary | ICD-10-CM | POA: Diagnosis not present

## 2016-10-04 DIAGNOSIS — Z9981 Dependence on supplemental oxygen: Secondary | ICD-10-CM | POA: Diagnosis not present

## 2016-10-04 DIAGNOSIS — J8410 Pulmonary fibrosis, unspecified: Secondary | ICD-10-CM | POA: Diagnosis not present

## 2016-10-04 DIAGNOSIS — I2510 Atherosclerotic heart disease of native coronary artery without angina pectoris: Secondary | ICD-10-CM | POA: Diagnosis not present

## 2016-10-05 DIAGNOSIS — Z9981 Dependence on supplemental oxygen: Secondary | ICD-10-CM | POA: Diagnosis not present

## 2016-10-05 DIAGNOSIS — I5032 Chronic diastolic (congestive) heart failure: Secondary | ICD-10-CM | POA: Diagnosis not present

## 2016-10-05 DIAGNOSIS — I110 Hypertensive heart disease with heart failure: Secondary | ICD-10-CM | POA: Diagnosis not present

## 2016-10-05 DIAGNOSIS — I2510 Atherosclerotic heart disease of native coronary artery without angina pectoris: Secondary | ICD-10-CM | POA: Diagnosis not present

## 2016-10-05 DIAGNOSIS — J8410 Pulmonary fibrosis, unspecified: Secondary | ICD-10-CM | POA: Diagnosis not present

## 2016-10-05 DIAGNOSIS — Z853 Personal history of malignant neoplasm of breast: Secondary | ICD-10-CM | POA: Diagnosis not present

## 2016-10-06 DIAGNOSIS — I5032 Chronic diastolic (congestive) heart failure: Secondary | ICD-10-CM | POA: Diagnosis not present

## 2016-10-06 DIAGNOSIS — I2510 Atherosclerotic heart disease of native coronary artery without angina pectoris: Secondary | ICD-10-CM | POA: Diagnosis not present

## 2016-10-06 DIAGNOSIS — K219 Gastro-esophageal reflux disease without esophagitis: Secondary | ICD-10-CM | POA: Diagnosis not present

## 2016-10-06 DIAGNOSIS — J8410 Pulmonary fibrosis, unspecified: Secondary | ICD-10-CM | POA: Diagnosis not present

## 2016-10-06 DIAGNOSIS — I110 Hypertensive heart disease with heart failure: Secondary | ICD-10-CM | POA: Diagnosis not present

## 2016-10-06 DIAGNOSIS — Z9981 Dependence on supplemental oxygen: Secondary | ICD-10-CM | POA: Diagnosis not present

## 2016-10-06 DIAGNOSIS — Z853 Personal history of malignant neoplasm of breast: Secondary | ICD-10-CM | POA: Diagnosis not present

## 2016-10-09 DIAGNOSIS — I5032 Chronic diastolic (congestive) heart failure: Secondary | ICD-10-CM | POA: Diagnosis not present

## 2016-10-09 DIAGNOSIS — Z853 Personal history of malignant neoplasm of breast: Secondary | ICD-10-CM | POA: Diagnosis not present

## 2016-10-09 DIAGNOSIS — J8410 Pulmonary fibrosis, unspecified: Secondary | ICD-10-CM | POA: Diagnosis not present

## 2016-10-09 DIAGNOSIS — I110 Hypertensive heart disease with heart failure: Secondary | ICD-10-CM | POA: Diagnosis not present

## 2016-10-09 DIAGNOSIS — I2510 Atherosclerotic heart disease of native coronary artery without angina pectoris: Secondary | ICD-10-CM | POA: Diagnosis not present

## 2016-10-09 DIAGNOSIS — Z9981 Dependence on supplemental oxygen: Secondary | ICD-10-CM | POA: Diagnosis not present

## 2016-10-10 DIAGNOSIS — I2510 Atherosclerotic heart disease of native coronary artery without angina pectoris: Secondary | ICD-10-CM | POA: Diagnosis not present

## 2016-10-10 DIAGNOSIS — I110 Hypertensive heart disease with heart failure: Secondary | ICD-10-CM | POA: Diagnosis not present

## 2016-10-10 DIAGNOSIS — Z9981 Dependence on supplemental oxygen: Secondary | ICD-10-CM | POA: Diagnosis not present

## 2016-10-10 DIAGNOSIS — I5032 Chronic diastolic (congestive) heart failure: Secondary | ICD-10-CM | POA: Diagnosis not present

## 2016-10-10 DIAGNOSIS — J8410 Pulmonary fibrosis, unspecified: Secondary | ICD-10-CM | POA: Diagnosis not present

## 2016-10-10 DIAGNOSIS — Z853 Personal history of malignant neoplasm of breast: Secondary | ICD-10-CM | POA: Diagnosis not present

## 2016-10-13 DIAGNOSIS — I110 Hypertensive heart disease with heart failure: Secondary | ICD-10-CM | POA: Diagnosis not present

## 2016-10-13 DIAGNOSIS — Z853 Personal history of malignant neoplasm of breast: Secondary | ICD-10-CM | POA: Diagnosis not present

## 2016-10-13 DIAGNOSIS — Z9981 Dependence on supplemental oxygen: Secondary | ICD-10-CM | POA: Diagnosis not present

## 2016-10-13 DIAGNOSIS — I2510 Atherosclerotic heart disease of native coronary artery without angina pectoris: Secondary | ICD-10-CM | POA: Diagnosis not present

## 2016-10-13 DIAGNOSIS — I5032 Chronic diastolic (congestive) heart failure: Secondary | ICD-10-CM | POA: Diagnosis not present

## 2016-10-13 DIAGNOSIS — J8410 Pulmonary fibrosis, unspecified: Secondary | ICD-10-CM | POA: Diagnosis not present

## 2016-10-16 DIAGNOSIS — I110 Hypertensive heart disease with heart failure: Secondary | ICD-10-CM | POA: Diagnosis not present

## 2016-10-16 DIAGNOSIS — Z853 Personal history of malignant neoplasm of breast: Secondary | ICD-10-CM | POA: Diagnosis not present

## 2016-10-16 DIAGNOSIS — J8410 Pulmonary fibrosis, unspecified: Secondary | ICD-10-CM | POA: Diagnosis not present

## 2016-10-16 DIAGNOSIS — Z9981 Dependence on supplemental oxygen: Secondary | ICD-10-CM | POA: Diagnosis not present

## 2016-10-16 DIAGNOSIS — I2510 Atherosclerotic heart disease of native coronary artery without angina pectoris: Secondary | ICD-10-CM | POA: Diagnosis not present

## 2016-10-16 DIAGNOSIS — I5032 Chronic diastolic (congestive) heart failure: Secondary | ICD-10-CM | POA: Diagnosis not present

## 2016-10-17 DIAGNOSIS — Z853 Personal history of malignant neoplasm of breast: Secondary | ICD-10-CM | POA: Diagnosis not present

## 2016-10-17 DIAGNOSIS — I110 Hypertensive heart disease with heart failure: Secondary | ICD-10-CM | POA: Diagnosis not present

## 2016-10-17 DIAGNOSIS — I2510 Atherosclerotic heart disease of native coronary artery without angina pectoris: Secondary | ICD-10-CM | POA: Diagnosis not present

## 2016-10-17 DIAGNOSIS — J8410 Pulmonary fibrosis, unspecified: Secondary | ICD-10-CM | POA: Diagnosis not present

## 2016-10-17 DIAGNOSIS — Z9981 Dependence on supplemental oxygen: Secondary | ICD-10-CM | POA: Diagnosis not present

## 2016-10-17 DIAGNOSIS — I5032 Chronic diastolic (congestive) heart failure: Secondary | ICD-10-CM | POA: Diagnosis not present

## 2016-10-18 DIAGNOSIS — Z9981 Dependence on supplemental oxygen: Secondary | ICD-10-CM | POA: Diagnosis not present

## 2016-10-18 DIAGNOSIS — J8410 Pulmonary fibrosis, unspecified: Secondary | ICD-10-CM | POA: Diagnosis not present

## 2016-10-18 DIAGNOSIS — Z853 Personal history of malignant neoplasm of breast: Secondary | ICD-10-CM | POA: Diagnosis not present

## 2016-10-18 DIAGNOSIS — I5032 Chronic diastolic (congestive) heart failure: Secondary | ICD-10-CM | POA: Diagnosis not present

## 2016-10-18 DIAGNOSIS — I2510 Atherosclerotic heart disease of native coronary artery without angina pectoris: Secondary | ICD-10-CM | POA: Diagnosis not present

## 2016-10-18 DIAGNOSIS — I110 Hypertensive heart disease with heart failure: Secondary | ICD-10-CM | POA: Diagnosis not present

## 2016-10-22 DIAGNOSIS — I2510 Atherosclerotic heart disease of native coronary artery without angina pectoris: Secondary | ICD-10-CM | POA: Diagnosis not present

## 2016-10-22 DIAGNOSIS — Z853 Personal history of malignant neoplasm of breast: Secondary | ICD-10-CM | POA: Diagnosis not present

## 2016-10-22 DIAGNOSIS — J8410 Pulmonary fibrosis, unspecified: Secondary | ICD-10-CM | POA: Diagnosis not present

## 2016-10-22 DIAGNOSIS — I5032 Chronic diastolic (congestive) heart failure: Secondary | ICD-10-CM | POA: Diagnosis not present

## 2016-10-22 DIAGNOSIS — Z9981 Dependence on supplemental oxygen: Secondary | ICD-10-CM | POA: Diagnosis not present

## 2016-10-22 DIAGNOSIS — I110 Hypertensive heart disease with heart failure: Secondary | ICD-10-CM | POA: Diagnosis not present

## 2016-10-23 DIAGNOSIS — I5032 Chronic diastolic (congestive) heart failure: Secondary | ICD-10-CM | POA: Diagnosis not present

## 2016-10-23 DIAGNOSIS — I110 Hypertensive heart disease with heart failure: Secondary | ICD-10-CM | POA: Diagnosis not present

## 2016-10-23 DIAGNOSIS — Z853 Personal history of malignant neoplasm of breast: Secondary | ICD-10-CM | POA: Diagnosis not present

## 2016-10-23 DIAGNOSIS — J8410 Pulmonary fibrosis, unspecified: Secondary | ICD-10-CM | POA: Diagnosis not present

## 2016-10-23 DIAGNOSIS — I2510 Atherosclerotic heart disease of native coronary artery without angina pectoris: Secondary | ICD-10-CM | POA: Diagnosis not present

## 2016-10-23 DIAGNOSIS — Z9981 Dependence on supplemental oxygen: Secondary | ICD-10-CM | POA: Diagnosis not present

## 2016-10-24 DIAGNOSIS — J8410 Pulmonary fibrosis, unspecified: Secondary | ICD-10-CM | POA: Diagnosis not present

## 2016-10-24 DIAGNOSIS — I5032 Chronic diastolic (congestive) heart failure: Secondary | ICD-10-CM | POA: Diagnosis not present

## 2016-10-24 DIAGNOSIS — Z9981 Dependence on supplemental oxygen: Secondary | ICD-10-CM | POA: Diagnosis not present

## 2016-10-24 DIAGNOSIS — I2510 Atherosclerotic heart disease of native coronary artery without angina pectoris: Secondary | ICD-10-CM | POA: Diagnosis not present

## 2016-10-24 DIAGNOSIS — Z853 Personal history of malignant neoplasm of breast: Secondary | ICD-10-CM | POA: Diagnosis not present

## 2016-10-24 DIAGNOSIS — I110 Hypertensive heart disease with heart failure: Secondary | ICD-10-CM | POA: Diagnosis not present

## 2016-10-25 ENCOUNTER — Other Ambulatory Visit: Payer: Self-pay | Admitting: Internal Medicine

## 2016-10-25 DIAGNOSIS — I2510 Atherosclerotic heart disease of native coronary artery without angina pectoris: Secondary | ICD-10-CM | POA: Diagnosis not present

## 2016-10-25 DIAGNOSIS — I5032 Chronic diastolic (congestive) heart failure: Secondary | ICD-10-CM | POA: Diagnosis not present

## 2016-10-25 DIAGNOSIS — Z853 Personal history of malignant neoplasm of breast: Secondary | ICD-10-CM | POA: Diagnosis not present

## 2016-10-25 DIAGNOSIS — I110 Hypertensive heart disease with heart failure: Secondary | ICD-10-CM | POA: Diagnosis not present

## 2016-10-25 DIAGNOSIS — Z9981 Dependence on supplemental oxygen: Secondary | ICD-10-CM | POA: Diagnosis not present

## 2016-10-25 DIAGNOSIS — J8410 Pulmonary fibrosis, unspecified: Secondary | ICD-10-CM | POA: Diagnosis not present

## 2016-10-27 DIAGNOSIS — Z9981 Dependence on supplemental oxygen: Secondary | ICD-10-CM | POA: Diagnosis not present

## 2016-10-27 DIAGNOSIS — J8410 Pulmonary fibrosis, unspecified: Secondary | ICD-10-CM | POA: Diagnosis not present

## 2016-10-27 DIAGNOSIS — I2510 Atherosclerotic heart disease of native coronary artery without angina pectoris: Secondary | ICD-10-CM | POA: Diagnosis not present

## 2016-10-27 DIAGNOSIS — Z853 Personal history of malignant neoplasm of breast: Secondary | ICD-10-CM | POA: Diagnosis not present

## 2016-10-27 DIAGNOSIS — I110 Hypertensive heart disease with heart failure: Secondary | ICD-10-CM | POA: Diagnosis not present

## 2016-10-27 DIAGNOSIS — I5032 Chronic diastolic (congestive) heart failure: Secondary | ICD-10-CM | POA: Diagnosis not present

## 2016-11-01 DIAGNOSIS — I2510 Atherosclerotic heart disease of native coronary artery without angina pectoris: Secondary | ICD-10-CM | POA: Diagnosis not present

## 2016-11-01 DIAGNOSIS — I5032 Chronic diastolic (congestive) heart failure: Secondary | ICD-10-CM | POA: Diagnosis not present

## 2016-11-01 DIAGNOSIS — Z9981 Dependence on supplemental oxygen: Secondary | ICD-10-CM | POA: Diagnosis not present

## 2016-11-01 DIAGNOSIS — Z853 Personal history of malignant neoplasm of breast: Secondary | ICD-10-CM | POA: Diagnosis not present

## 2016-11-01 DIAGNOSIS — J8410 Pulmonary fibrosis, unspecified: Secondary | ICD-10-CM | POA: Diagnosis not present

## 2016-11-01 DIAGNOSIS — I110 Hypertensive heart disease with heart failure: Secondary | ICD-10-CM | POA: Diagnosis not present

## 2016-11-03 DIAGNOSIS — I2510 Atherosclerotic heart disease of native coronary artery without angina pectoris: Secondary | ICD-10-CM | POA: Diagnosis not present

## 2016-11-03 DIAGNOSIS — I110 Hypertensive heart disease with heart failure: Secondary | ICD-10-CM | POA: Diagnosis not present

## 2016-11-03 DIAGNOSIS — Z853 Personal history of malignant neoplasm of breast: Secondary | ICD-10-CM | POA: Diagnosis not present

## 2016-11-03 DIAGNOSIS — I5032 Chronic diastolic (congestive) heart failure: Secondary | ICD-10-CM | POA: Diagnosis not present

## 2016-11-03 DIAGNOSIS — Z9981 Dependence on supplemental oxygen: Secondary | ICD-10-CM | POA: Diagnosis not present

## 2016-11-03 DIAGNOSIS — J8410 Pulmonary fibrosis, unspecified: Secondary | ICD-10-CM | POA: Diagnosis not present

## 2016-11-05 ENCOUNTER — Inpatient Hospital Stay (HOSPITAL_COMMUNITY)
Admission: EM | Admit: 2016-11-05 | Discharge: 2016-11-08 | DRG: 197 | Disposition: A | Discharge status: Hospice | Attending: Family Medicine | Admitting: Family Medicine

## 2016-11-05 ENCOUNTER — Emergency Department (HOSPITAL_COMMUNITY)

## 2016-11-05 ENCOUNTER — Encounter (HOSPITAL_COMMUNITY): Payer: Self-pay | Admitting: Nurse Practitioner

## 2016-11-05 DIAGNOSIS — D72829 Elevated white blood cell count, unspecified: Secondary | ICD-10-CM | POA: Diagnosis present

## 2016-11-05 DIAGNOSIS — N390 Urinary tract infection, site not specified: Secondary | ICD-10-CM | POA: Diagnosis present

## 2016-11-05 DIAGNOSIS — J8410 Pulmonary fibrosis, unspecified: Secondary | ICD-10-CM | POA: Diagnosis not present

## 2016-11-05 DIAGNOSIS — Z7982 Long term (current) use of aspirin: Secondary | ICD-10-CM

## 2016-11-05 DIAGNOSIS — N3000 Acute cystitis without hematuria: Secondary | ICD-10-CM

## 2016-11-05 DIAGNOSIS — Z853 Personal history of malignant neoplasm of breast: Secondary | ICD-10-CM | POA: Diagnosis not present

## 2016-11-05 DIAGNOSIS — R42 Dizziness and giddiness: Secondary | ICD-10-CM | POA: Diagnosis not present

## 2016-11-05 DIAGNOSIS — I5032 Chronic diastolic (congestive) heart failure: Secondary | ICD-10-CM | POA: Diagnosis not present

## 2016-11-05 DIAGNOSIS — R531 Weakness: Secondary | ICD-10-CM

## 2016-11-05 DIAGNOSIS — R5381 Other malaise: Secondary | ICD-10-CM | POA: Diagnosis present

## 2016-11-05 DIAGNOSIS — Z9981 Dependence on supplemental oxygen: Secondary | ICD-10-CM

## 2016-11-05 DIAGNOSIS — Z9049 Acquired absence of other specified parts of digestive tract: Secondary | ICD-10-CM

## 2016-11-05 DIAGNOSIS — I110 Hypertensive heart disease with heart failure: Secondary | ICD-10-CM | POA: Diagnosis not present

## 2016-11-05 DIAGNOSIS — Z8249 Family history of ischemic heart disease and other diseases of the circulatory system: Secondary | ICD-10-CM

## 2016-11-05 DIAGNOSIS — Z9011 Acquired absence of right breast and nipple: Secondary | ICD-10-CM

## 2016-11-05 DIAGNOSIS — M5136 Other intervertebral disc degeneration, lumbar region: Secondary | ICD-10-CM | POA: Diagnosis present

## 2016-11-05 DIAGNOSIS — K219 Gastro-esophageal reflux disease without esophagitis: Secondary | ICD-10-CM | POA: Diagnosis present

## 2016-11-05 DIAGNOSIS — I2510 Atherosclerotic heart disease of native coronary artery without angina pectoris: Secondary | ICD-10-CM | POA: Diagnosis not present

## 2016-11-05 DIAGNOSIS — I10 Essential (primary) hypertension: Secondary | ICD-10-CM | POA: Diagnosis present

## 2016-11-05 DIAGNOSIS — R05 Cough: Secondary | ICD-10-CM | POA: Diagnosis not present

## 2016-11-05 DIAGNOSIS — J84112 Idiopathic pulmonary fibrosis: Secondary | ICD-10-CM | POA: Diagnosis not present

## 2016-11-05 DIAGNOSIS — Z66 Do not resuscitate: Secondary | ICD-10-CM | POA: Diagnosis present

## 2016-11-05 DIAGNOSIS — Z90710 Acquired absence of both cervix and uterus: Secondary | ICD-10-CM

## 2016-11-05 DIAGNOSIS — R404 Transient alteration of awareness: Secondary | ICD-10-CM | POA: Diagnosis not present

## 2016-11-05 DIAGNOSIS — Z79899 Other long term (current) drug therapy: Secondary | ICD-10-CM

## 2016-11-05 DIAGNOSIS — Z87440 Personal history of urinary (tract) infections: Secondary | ICD-10-CM

## 2016-11-05 DIAGNOSIS — G8929 Other chronic pain: Secondary | ICD-10-CM | POA: Diagnosis present

## 2016-11-05 LAB — CBC WITH DIFFERENTIAL/PLATELET
BASOS PCT: 0 %
Basophils Absolute: 0 10*3/uL (ref 0.0–0.1)
EOS PCT: 1 %
Eosinophils Absolute: 0.1 10*3/uL (ref 0.0–0.7)
HEMATOCRIT: 38.8 % (ref 36.0–46.0)
Hemoglobin: 12.1 g/dL (ref 12.0–15.0)
LYMPHS ABS: 1.1 10*3/uL (ref 0.7–4.0)
Lymphocytes Relative: 9 %
MCH: 27.6 pg (ref 26.0–34.0)
MCHC: 31.2 g/dL (ref 30.0–36.0)
MCV: 88.4 fL (ref 78.0–100.0)
MONO ABS: 1.1 10*3/uL — AB (ref 0.1–1.0)
Monocytes Relative: 9 %
NEUTROS ABS: 10.1 10*3/uL — AB (ref 1.7–7.7)
Neutrophils Relative %: 81 %
PLATELETS: ADEQUATE 10*3/uL (ref 150–400)
RBC: 4.39 MIL/uL (ref 3.87–5.11)
RDW: 16.2 % — AB (ref 11.5–15.5)
WBC: 12.4 10*3/uL — ABNORMAL HIGH (ref 4.0–10.5)

## 2016-11-05 LAB — COMPREHENSIVE METABOLIC PANEL
ALT: 16 U/L (ref 14–54)
AST: 21 U/L (ref 15–41)
Albumin: 3.2 g/dL — ABNORMAL LOW (ref 3.5–5.0)
Alkaline Phosphatase: 54 U/L (ref 38–126)
Anion gap: 5 (ref 5–15)
BILIRUBIN TOTAL: 0.8 mg/dL (ref 0.3–1.2)
BUN: 16 mg/dL (ref 6–20)
CHLORIDE: 98 mmol/L — AB (ref 101–111)
CO2: 32 mmol/L (ref 22–32)
CREATININE: 0.56 mg/dL (ref 0.44–1.00)
Calcium: 10 mg/dL (ref 8.9–10.3)
Glucose, Bld: 100 mg/dL — ABNORMAL HIGH (ref 65–99)
POTASSIUM: 4.8 mmol/L (ref 3.5–5.1)
Sodium: 135 mmol/L (ref 135–145)
TOTAL PROTEIN: 6.5 g/dL (ref 6.5–8.1)

## 2016-11-05 LAB — URINALYSIS, ROUTINE W REFLEX MICROSCOPIC
BACTERIA UA: NONE SEEN
BILIRUBIN URINE: NEGATIVE
Glucose, UA: NEGATIVE mg/dL
Ketones, ur: NEGATIVE mg/dL
NITRITE: NEGATIVE
PH: 7 (ref 5.0–8.0)
Protein, ur: NEGATIVE mg/dL
SPECIFIC GRAVITY, URINE: 1.013 (ref 1.005–1.030)

## 2016-11-05 LAB — I-STAT TROPONIN, ED: TROPONIN I, POC: 0.05 ng/mL (ref 0.00–0.08)

## 2016-11-05 LAB — I-STAT CG4 LACTIC ACID, ED: LACTIC ACID, VENOUS: 0.59 mmol/L (ref 0.5–1.9)

## 2016-11-05 MED ORDER — POTASSIUM CHLORIDE CRYS ER 20 MEQ PO TBCR
40.0000 meq | EXTENDED_RELEASE_TABLET | Freq: Every day | ORAL | Status: DC
  Administered 2016-11-06 – 2016-11-08 (×3): 40 meq via ORAL
  Filled 2016-11-05 (×3): qty 2

## 2016-11-05 MED ORDER — GUAIFENESIN ER 600 MG PO TB12
600.0000 mg | ORAL_TABLET | Freq: Two times a day (BID) | ORAL | Status: DC | PRN
  Administered 2016-11-06: 22:00:00 600 mg via ORAL
  Filled 2016-11-05 (×2): qty 1

## 2016-11-05 MED ORDER — SERTRALINE HCL 100 MG PO TABS
100.0000 mg | ORAL_TABLET | Freq: Every day | ORAL | Status: DC
  Administered 2016-11-06 – 2016-11-08 (×3): 100 mg via ORAL
  Filled 2016-11-05 (×3): qty 1

## 2016-11-05 MED ORDER — VITAMIN D3 25 MCG (1000 UNIT) PO TABS
2000.0000 [IU] | ORAL_TABLET | Freq: Every day | ORAL | Status: DC
  Administered 2016-11-06 – 2016-11-08 (×3): 2000 [IU] via ORAL
  Filled 2016-11-05 (×4): qty 2

## 2016-11-05 MED ORDER — PREDNISONE 20 MG PO TABS
20.0000 mg | ORAL_TABLET | Freq: Every day | ORAL | Status: DC
  Administered 2016-11-06 – 2016-11-08 (×3): 20 mg via ORAL
  Filled 2016-11-05 (×3): qty 1

## 2016-11-05 MED ORDER — SODIUM CHLORIDE 0.9 % IV SOLN: 250.0000 mL | INTRAVENOUS | Status: DC | PRN

## 2016-11-05 MED ORDER — CYCLOBENZAPRINE HCL 5 MG PO TABS
5.0000 mg | ORAL_TABLET | Freq: Two times a day (BID) | ORAL | Status: DC | PRN
  Administered 2016-11-06: 04:00:00 5 mg via ORAL
  Filled 2016-11-05: qty 1

## 2016-11-05 MED ORDER — POLYETHYLENE GLYCOL 3350 17 G PO PACK: 17.0000 g | PACK | Freq: Every day | ORAL | Status: DC | PRN

## 2016-11-05 MED ORDER — ENOXAPARIN SODIUM 40 MG/0.4ML ~~LOC~~ SOLN
40.0000 mg | SUBCUTANEOUS | Status: DC
  Administered 2016-11-05 – 2016-11-07 (×3): 40 mg via SUBCUTANEOUS
  Filled 2016-11-05 (×3): qty 0.4

## 2016-11-05 MED ORDER — FAMOTIDINE 20 MG PO TABS
20.0000 mg | ORAL_TABLET | Freq: Every day | ORAL | Status: DC
  Administered 2016-11-05 – 2016-11-07 (×3): 20 mg via ORAL
  Filled 2016-11-05 (×3): qty 1

## 2016-11-05 MED ORDER — MORPHINE SULFATE (PF) 4 MG/ML IV SOLN
4.0000 mg | Freq: Once | INTRAVENOUS | Status: AC
  Administered 2016-11-05: 4 mg via INTRAVENOUS
  Filled 2016-11-05: qty 1

## 2016-11-05 MED ORDER — FUROSEMIDE 20 MG PO TABS
40.0000 mg | ORAL_TABLET | Freq: Every morning | ORAL | Status: DC
  Administered 2016-11-06 – 2016-11-08 (×3): 40 mg via ORAL
  Filled 2016-11-05 (×2): qty 1
  Filled 2016-11-05: qty 2

## 2016-11-05 MED ORDER — PREDNISONE 10 MG PO TABS
10.0000 mg | ORAL_TABLET | Freq: Every day | ORAL | Status: DC
  Administered 2016-11-06 – 2016-11-08 (×3): 10 mg via ORAL
  Filled 2016-11-05 (×2): qty 2
  Filled 2016-11-05: qty 1

## 2016-11-05 MED ORDER — ASPIRIN 325 MG PO TABS
325.0000 mg | ORAL_TABLET | Freq: Every day | ORAL | Status: DC
  Administered 2016-11-06 – 2016-11-08 (×3): 325 mg via ORAL
  Filled 2016-11-05 (×4): qty 1

## 2016-11-05 MED ORDER — SALINE SPRAY 0.65 % NA SOLN
1.0000 | Freq: Every day | NASAL | Status: DC | PRN
  Filled 2016-11-05: qty 44

## 2016-11-05 MED ORDER — MAGNESIUM OXIDE 400 (241.3 MG) MG PO TABS
400.0000 mg | ORAL_TABLET | Freq: Every day | ORAL | Status: DC
  Administered 2016-11-05 – 2016-11-07 (×3): 400 mg via ORAL
  Filled 2016-11-05 (×3): qty 1

## 2016-11-05 MED ORDER — ONDANSETRON HCL 4 MG/2ML IJ SOLN: 4.0000 mg | Freq: Four times a day (QID) | INTRAMUSCULAR | Status: DC | PRN

## 2016-11-05 MED ORDER — DEXTROSE 5 % IV SOLN
1.0000 g | Freq: Once | INTRAVENOUS | Status: AC
  Administered 2016-11-05: 1 g via INTRAVENOUS
  Filled 2016-11-05: qty 10

## 2016-11-05 MED ORDER — MORPHINE SULFATE (CONCENTRATE) 10 MG/0.5ML PO SOLN
10.0000 mg | ORAL | Status: DC | PRN
  Administered 2016-11-05 – 2016-11-07 (×6): 10 mg via SUBLINGUAL
  Filled 2016-11-05 (×6): qty 0.5

## 2016-11-05 MED ORDER — LORAZEPAM 0.5 MG PO TABS
0.2500 mg | ORAL_TABLET | ORAL | Status: DC | PRN
  Administered 2016-11-06: 22:00:00 0.25 mg via ORAL
  Filled 2016-11-05: qty 1

## 2016-11-05 MED ORDER — DEXTROSE 5 % IV SOLN
1.0000 g | INTRAVENOUS | Status: DC
  Administered 2016-11-06 – 2016-11-07 (×2): 1 g via INTRAVENOUS
  Filled 2016-11-05 (×2): qty 10

## 2016-11-05 MED ORDER — FLUTICASONE PROPIONATE 50 MCG/ACT NA SUSP
2.0000 | Freq: Two times a day (BID) | NASAL | Status: DC | PRN
  Filled 2016-11-05: qty 16

## 2016-11-05 MED ORDER — HALOPERIDOL 1 MG PO TABS
1.0000 mg | ORAL_TABLET | ORAL | Status: DC | PRN
  Filled 2016-11-05: qty 1

## 2016-11-05 MED ORDER — ONDANSETRON HCL 4 MG PO TABS: 4.0000 mg | ORAL_TABLET | Freq: Four times a day (QID) | ORAL | Status: DC | PRN

## 2016-11-05 MED ORDER — SODIUM CHLORIDE 0.9% FLUSH: 3.0000 mL | INTRAVENOUS | Status: DC | PRN

## 2016-11-05 MED ORDER — SODIUM CHLORIDE 0.9 % IV SOLN
INTRAVENOUS | Status: AC
  Administered 2016-11-05: 13:00:00 via INTRAVENOUS

## 2016-11-05 MED ORDER — PANTOPRAZOLE SODIUM 40 MG PO TBEC
40.0000 mg | DELAYED_RELEASE_TABLET | Freq: Every day | ORAL | Status: DC
  Administered 2016-11-06 – 2016-11-08 (×3): 40 mg via ORAL
  Filled 2016-11-05 (×3): qty 1

## 2016-11-05 MED ORDER — SODIUM CHLORIDE 0.9% FLUSH
3.0000 mL | Freq: Two times a day (BID) | INTRAVENOUS | Status: DC
  Administered 2016-11-05 – 2016-11-08 (×6): 3 mL via INTRAVENOUS

## 2016-11-05 NOTE — ED Provider Notes (Signed)
[place] DEPT Provider Note   CSN: [id number] Arrival date & time: 11/05/16  [id number]     History   Chief Complaint Chief Complaint  Patient presents with  . Weakness    HPI [name] is a 82 y.o. female.  Patient is an 82 year old female with a history of pulmonary fibrosis on 4-1/2 L of oxygen, coronary artery disease, CHF, hypertension presenting today with generalized weakness that's worsened over the last 2 weeks. Over the last 2 weeks she's also noted worsening shortness of breath, wheezing and change in sputum production. The color of the sputum has also changed. Also noticed some intermittent burning of urination and occasional incontinence. Incontinence is not new and states she was last treated for a urinary tract infection 2 weeks ago. She has been eating and drinking and did eat breakfast this morning. She denies any chest pain, fever, abdominal pain, vomiting or diarrhea. She does live alone and this morning she felt increasingly weak and was afraid she may fall.   The history is provided by the patient.  Weakness  Primary symptoms comment: generalized weakness.    Past Medical History:  Diagnosis Date  . Arthritis   . Breast cancer ([place]) 1969  . CAD (coronary artery disease)   . Chronic diastolic CHF (congestive heart failure) ([place]) 08/01/2015  . Colon polyp 2013   TUBULAR ADENOMA (X1  . Depression   . Diverticulosis 2008  . Dyspnea   . Esophageal stricture   . Gallstones   . GERD (gastroesophageal reflux disease)   . HLD (hyperlipidemia)   . HTN (hypertension)   . Internal hemorrhoids   . Obesity   . Pulmonary fibrosis [facility])     Patient Active Problem List   Diagnosis Date Noted  . Left shoulder pain 08/03/2016  . Cough 05/28/2016  . Physical deconditioning 04/20/2016  . Weakness generalized   . Dysphagia   . Pyrexia   . Sepsis ([place]) 09/05/2015  . Palliative care encounter 09/03/2015  . DNR (do not resuscitate) 09/03/2015  . Bronchitis,  chronic obstructive w acute bronchitis ([place]) 09/02/2015  . HCAP (healthcare-associated pneumonia) 09/01/2015  . Dyspnea 08/31/2015  . Pain   . Depression 08/22/2015  . Chronic hyponatremia 08/03/2015  . Chronic diastolic CHF (congestive heart failure) ([place]) 08/01/2015  . SOB (shortness of breath) 08/01/2015  . Chronic respiratory failure with hypoxia ([place]) 07/28/2015  . Obesity 07/01/2015  . Hyperlipidemia 09/17/2014  . Coronary artery disease 08/17/2014  . Leukocytosis 08/17/2014  . Protein-calorie malnutrition ([place]) 08/17/2014  . Postinflammatory pulmonary fibrosis ([place]) 08/16/2014  . Change in bowel habits 07/24/2012  . Special screening for malignant neoplasms, colon 12/08/2011  . Stricture and stenosis of esophagus 12/08/2011  . NEOPLASM, MALIGNANT, RIGHT BREAST 11/28/2010  . OBSTRUCTIVE SLEEP APNEA 11/28/2010  . Essential hypertension 11/28/2010  . ALLERGIC RHINITIS 11/28/2010  . GERD 11/28/2010    Past Surgical History:  Procedure Laterality Date  . ABDOMINAL HYSTERECTOMY    . APPENDECTOMY    . BREAST SURGERY    . CATARACT EXTRACTION Left 02/2016  . CHOLECYSTECTOMY    . FOOT SURGERY    . MASTECTOMY     right    OB History    No data available       Home Medications    Prior to Admission medications   Medication Sig Start Date End Date Taking? Authorizing Provider  aspirin 325 MG tablet TK 1 T PO QD 08/01/16  Yes Historical Provider, MD  Cholecalciferol (VITAMIN D) 2000 units  CAPS Take 2,000 Units by mouth daily.   Yes Historical Provider, MD  famotidine (PEPCID) 20 MG tablet One at bedtime 05/26/16  Yes Tanda Rockers, MD  fluticasone [facility]) 50 MCG/ACT nasal spray Place 2 sprays into both nostrils 2 (two) times daily as needed for allergies or rhinitis.    Yes Historical Provider, MD  furosemide (LASIX) 40 MG tablet Take 1 tablet (40 mg total) by mouth every morning. 07/11/16  Yes Binnie Rail, MD  haloperidol (HALDOL) 1 MG tablet Take 1 mg by mouth every 4  (four) hours as needed for nausea or agitation. 10/17/16  Yes Historical Provider, MD  LORazepam (ATIVAN) 0.5 MG tablet TK 1/2 T PO Q 4 H PRN FOR ANXIETY OR SHORTNESS OF BREATH 07/25/16  Yes Historical Provider, MD  Magnesium Oxide -Mg Supplement 250 MG TABS Take 1 tablet (250 mg total) by mouth at bedtime. --- Must have office visit for further refills. 10/25/16  Yes Binnie Rail, MD  MELATONIN PO Take 1 tablet by mouth at bedtime.   Yes Historical Provider, MD  Morphine Sulfate (MORPHINE CONCENTRATE) 10 mg / 0.5 ml concentrated solution TK 0.20 ML  PO Q 4 H PRN PAIN 06/16/16  Yes Historical Provider, MD  MUCUS RELIEF ER 600 MG 12 hr tablet Take 1 tablet by mouth in the morning and then take 1 at night 07/18/16  Yes Historical Provider, MD  [place] 24HR 20 MG capsule TAKE 1 CAPSULE BY MOUTH TWICE DAILY Patient taking differently: Take 2 by mouth once in the morning 09/06/16  Yes Tanda Rockers, MD  polyethylene glycol (MIRALAX / GLYCOLAX) packet Take 17 g by mouth daily as needed. Patient taking differently: Take 17 g by mouth daily as needed for mild constipation.  09/10/15  Yes Domenic Polite, MD  potassium chloride SA (K-DUR,KLOR-CON) 20 MEQ tablet Take 1 tablet (20 mEq total) by mouth daily. Patient taking differently: Take 40 mEq by mouth daily.  06/01/16  Yes Binnie Rail, MD  predniSONE (DELTASONE) 10 MG tablet TAKE 1 TABLET BY MOUTH EVERY DAY ALONG WITH 20 MG PREDNISONE TABLETS TO EQUAL 30MG  10/03/16  Yes Binnie Rail, MD  predniSONE (DELTASONE) 20 MG tablet Take 20 mg by mouth daily with breakfast. Take along with 10 mg tablet=30 mg daily 05/26/16  Yes Historical Provider, MD  sertraline (ZOLOFT) 100 MG tablet Take 1 tablet (100 mg total) by mouth daily. -- must have office visit for further refills 10/25/16  Yes Binnie Rail, MD  sodium chloride (OCEAN) 0.65 % SOLN nasal spray Place 1 spray into both nostrils 2 (two) times daily. Patient taking differently: Place 1 spray into both nostrils  daily as needed for congestion.  08/20/14  Yes Marton Redwood, MD  cyclobenzaprine (FLEXERIL) 5 MG tablet Take 1 tablet (5 mg total) by mouth 2 (two) times daily as needed for muscle spasms. 07/28/16   Courtney Paris, MD  OXYGEN Oxygen 3lpm 24/7 DME- [facility]    Historical Provider, MD    Family History Family History  Problem Relation Age of Onset  . Heart disease Mother   . Cancer Mother     bladder  . Esophageal cancer Brother   . Prostate cancer Brother   . Heart disease Brother   . Cancer Brother     sarcoma  . Stomach cancer Neg Hx     Social History Social History  Substance Use Topics  . Smoking status: Never Smoker  . Smokeless tobacco: Never  Used  . Alcohol use 4.2 oz/week    7 Glasses of wine per week     Comment: occasional     Allergies   Anesthetics, amide; Atorvastatin; Codeine; Darvocet [propoxyphene n-acetaminophen]; Valsartan; Antihistamines, loratadine-type; and Moxifloxacin   Review of Systems Review of Systems  Neurological: Positive for weakness.  All other systems reviewed and are negative.    Physical Exam Updated Vital Signs BP 143/61 (BP Location: Right Arm)   Pulse 88   Temp 98.7 F (37.1 C) (Oral)   Resp 18   SpO2 100%   Physical Exam  Constitutional: She is oriented to person, place, and time. She appears well-developed and well-nourished. No distress.  HENT:  Head: Normocephalic and atraumatic.  Mouth/Throat: Oropharynx is clear and moist.  Eyes: Conjunctivae and EOM are normal. Pupils are equal, round, and reactive to light.  Neck: Normal range of motion. Neck supple.  Cardiovascular: Normal rate, regular rhythm and intact distal pulses.   No murmur heard. Pulmonary/Chest: Effort normal. No respiratory distress. She has no wheezes.  Findings crackles on inspiration throughout lung fields  Abdominal: Soft. She exhibits no distension. There is no tenderness. There is no rebound and no guarding.    Musculoskeletal: Normal range of motion. She exhibits no edema or tenderness.  Neurological: She is alert and oriented to person, place, and time.  5 out of 5 strength in bilateral lower extremities. Sensation is intact. DP Pulses 2+  Skin: Skin is warm and dry. No rash noted. No erythema.  Psychiatric: She has a normal mood and affect. Her behavior is normal.  Nursing note and vitals reviewed.    ED Treatments / Results  Labs (all labs ordered are listed, but only abnormal results are displayed) Labs Reviewed  CBC WITH DIFFERENTIAL/PLATELET - Abnormal; Notable for the following:       Result Value   WBC 12.4 (*)    RDW 16.2 (*)    Neutro Abs 10.1 (*)    Monocytes Absolute 1.1 (*)    All other components within normal limits  COMPREHENSIVE METABOLIC PANEL - Abnormal; Notable for the following:    Chloride 98 (*)    Glucose, Bld 100 (*)    Albumin 3.2 (*)    All other components within normal limits  URINALYSIS, ROUTINE W REFLEX MICROSCOPIC - Abnormal; Notable for the following:    APPearance HAZY (*)    Hgb urine dipstick SMALL (*)    Leukocytes, UA LARGE (*)    Squamous Epithelial / LPF 0-5 (*)    All other components within normal limits  URINE CULTURE  I-STAT TROPOININ, ED  I-STAT CG4 LACTIC ACID, ED    EKG  EKG Interpretation  Date/Time:  Sunday November 05 2016 08:38:04 EST Ventricular Rate:  93 PR Interval:    QRS Duration: 97 QT Interval:  355 QTC Calculation: 442 R Axis:   93 Text Interpretation:  Sinus rhythm Probable left atrial enlargement Probable anteroseptal infarct Confirmed by Wilson Singer  MD, STEPHEN [id number]) on 11/05/2016 8:45:22 AM       Radiology No results found.  Procedures Procedures (including critical care time)  Medications Ordered in ED Medications  cefTRIAXone (ROCEPHIN) 1 g in dextrose 5 % 50 mL IVPB (not administered)  morphine 4 MG/ML injection 4 mg (4 mg Intravenous Given 11/05/16 1028)     Initial Impression / Assessment and  Plan / ED Course  I have reviewed the triage vital signs and the nursing notes.  Pertinent labs & imaging results that  were available during my care of the patient were reviewed by me and considered in my medical decision making (see chart for details).  Clinical Course    Patient is an elderly female with multiple medical problems presenting today with generalized weakness over the last 2 weeks. She has noted change in the color of her sputum in the last few weeks as well as worsening wheezing and shortness of breath. Also complains of smell and intermittent burning of urination. She has no abdominal pain nausea or vomiting. She does not look extremely dehydrated. She states the last time she was treated for a urinary tract infection was approximately 2 weeks ago. Symptoms could be related to recurrent UTI versus acute renal failure from recent antibiotics versus pneumonia vs cardiac cause. Patient has received a flu and pneumonia vaccine. CBC, CMP, UA, troponin, EKG, chest x-ray pending.  No evidence of fluid overload  12:24 PM Labs are consistent with a UTI with to numerous to count white blood cells without signs of sepsis or acute renal failure. Patient given Rocephin. Last culture done in May showed Escherichia coli susceptible to Rocephin. In May her cultures were resistant to amoxicillin and she states that was the most recent antibiotic she was on for UTI. Patient states she feels so weak she does not feel safe going home. Patient is a hospice patient but they've been unable to contact them all morning. Hospice only comes to her home several days a week.  Final Clinical Impressions(s) / ED Diagnoses   Final diagnoses:  Acute cystitis without hematuria  Generalized weakness    New Prescriptions New Prescriptions   No medications on file     Blanchie Dessert, MD 11/05/16 1225

## 2016-11-05 NOTE — ED Notes (Signed)
Bed: WA02 Expected date:  Expected time:  Means of arrival:  Comments: 82 yo weakness x2 weeks

## 2016-11-05 NOTE — ED Notes (Signed)
Attempted to start 20 min timer. Floor said room is being cleaned and said to call back in 20 min.

## 2016-11-05 NOTE — Progress Notes (Signed)
Pharmacy Antibiotic Follow-up Note  [name] is a 83 y.o. year-old female admitted on 11/05/2016.  The patient is currently on day 1 of Rocephin for suspected UTI.  Assessment/Plan: This patient's current antibiotics will be continued without adjustments.Rocephin 1gm q24 Pharmacy will sign off protocol, Rocephin needs no adjustment for renal function  Temp (24hrs), Avg:98.7 F (37.1 C), Min:98.7 F (37.1 C), Max:98.7 F (37.1 C)   Recent Labs Lab 11/05/16 1039  WBC 12.4*    Recent Labs Lab 11/05/16 1039  CREATININE 0.56   CrCl cannot be calculated (Unknown ideal weight.).    Allergies  Allergen Reactions  . Anesthetics, Amide Nausea And Vomiting  . Atorvastatin Other (See Comments)    Myalgia   . Codeine Nausea And Vomiting  . Darvocet [Propoxyphene N-Acetaminophen] Nausea And Vomiting  . Valsartan Other (See Comments)    Per patient " did not feel well, cannot describe it"  . Antihistamines, Loratadine-Type Other (See Comments)    Weird dreams  . Moxifloxacin Anxiety and Other (See Comments)    Nightmares   Antimicrobials this admission:  12/31 Rocephin >>  Microbiology results:  12/31 BCx: sent 12/31 UCx: sent   Thank you for allowing pharmacy to be a part of this patient's care.  Minda Ditto PharmD 11/05/2016 2:22 PM

## 2016-11-05 NOTE — ED Triage Notes (Signed)
Patient presents to [facility] for complaints of worsening weakness over the past 2 weeks. This morning she was ambulating to her bedside commode and felt that it was increasingly difficult and she became incontinent. She says the incontinence is not new but that the worsening mobility and weakness is. She explains that her cough is worse and her sputum is yellow. She has a history of pulmonary fibrosis, normally has a cough and white sputum, and wears 4L with a condenser at home. Today she is on 4L via nasal cannula with SpO2 99%. She says that she has not had an appetite but is drinking normally. Denies diarrhea, nausea, vomiting, fever, confusion, urinary burning, urgency, pain.   She is followed by [facility] and receives home care services 3 times a week to help with medications and ADLs. She says she is a DNR, and has 'all that paperwork'.

## 2016-11-05 NOTE — ED Notes (Signed)
Patient now complaining of right leg/hip pain. Says that it is 'killing her'. Requesting morphine. Also requesting water, a different bed, and to see the doctor urgently. She says that her leg did not start hurting until she got in this bed.

## 2016-11-05 NOTE — ED Notes (Signed)
Patient transported to X-ray 

## 2016-11-05 NOTE — H&P (Signed)
[facility] History and Physical  [name] [id number] DOB: Jan 21, 1928 DOA: 11/05/2016  PCP: Binnie Rail, MD  Patient coming from: Home  Chief Complaint: weakness, painful urination  HPI: [name] is a 82 y.o. female with a medical history of pulmonary fibrosis on 4 and half liters of O2 at home, GERD, hypertension, chronic diastolic heart failure, who presented to the emergency department with complaints of weakness and painful urination. Patient states she had a urinary tract infection approximate 2 weeks ago was treated with ampicillin as an outpatient. She currently has been having burning with urination with some occasional incontinence however the incontinence is not new. She feels she takes too much Lasix. She does live at home alone and has been feeling increasingly weak over the last several days. She attempted to call hospice however did not get a lot of person on the phone. Patient thought she may fall, she came to the emergency department. Currently she denies any chest pain, abnormalities with her breathing, dizziness or headache, nausea or vomiting, diarrhea or constipation. She denies any recent ill or sick contacts or travel.  ED Course: Found to have a UTI, started on ceftriaxone. [facility] called for admission.  Review of Systems:  All other systems reviewed and are negative.   Past Medical History:  Diagnosis Date  . Arthritis   . Breast cancer ([place]) 1969  . CAD (coronary artery disease)   . Chronic diastolic CHF (congestive heart failure) ([place]) 08/01/2015  . Colon polyp 2013   TUBULAR ADENOMA (X1  . Depression   . Diverticulosis 2008  . Dyspnea   . Esophageal stricture   . Gallstones   . GERD (gastroesophageal reflux disease)   . HLD (hyperlipidemia)   . HTN (hypertension)   . Internal hemorrhoids   . Obesity   . Pulmonary fibrosis ([place])     Past Surgical History:  Procedure Laterality Date  . ABDOMINAL HYSTERECTOMY    . APPENDECTOMY    .  BREAST SURGERY    . CATARACT EXTRACTION Left 02/2016  . CHOLECYSTECTOMY    . FOOT SURGERY    . MASTECTOMY     right    Social History:  reports that she has never smoked. She has never used smokeless tobacco. She reports that she drinks about 4.2 oz of alcohol per week . She reports that she does not use drugs. Lives alone.  Allergies  Allergen Reactions  . Anesthetics, Amide Nausea And Vomiting  . Atorvastatin Other (See Comments)    Myalgia   . Codeine Nausea And Vomiting  . Darvocet [Propoxyphene N-Acetaminophen] Nausea And Vomiting  . Valsartan Other (See Comments)    Per patient " did not feel well, cannot describe it"  . Antihistamines, Loratadine-Type Other (See Comments)    Weird dreams  . Moxifloxacin Anxiety and Other (See Comments)    Nightmares    Family History  Problem Relation Age of Onset  . Heart disease Mother   . Cancer Mother     bladder  . Esophageal cancer Brother   . Prostate cancer Brother   . Heart disease Brother   . Cancer Brother     sarcoma  . Stomach cancer Neg Hx     Prior to Admission medications   Medication Sig Start Date End Date Taking? Authorizing Provider  aspirin 325 MG tablet TK 1 T PO QD 08/01/16  Yes Historical Provider, MD  Cholecalciferol (VITAMIN D) 2000 units CAPS Take 2,000 Units by mouth daily.  Yes Historical Provider, MD  famotidine (PEPCID) 20 MG tablet One at bedtime 05/26/16  Yes Tanda Rockers, MD  fluticasone [facility]) 50 MCG/ACT nasal spray Place 2 sprays into both nostrils 2 (two) times daily as needed for allergies or rhinitis.    Yes Historical Provider, MD  furosemide (LASIX) 40 MG tablet Take 1 tablet (40 mg total) by mouth every morning. 07/11/16  Yes Binnie Rail, MD  haloperidol (HALDOL) 1 MG tablet Take 1 mg by mouth every 4 (four) hours as needed for nausea or agitation. 10/17/16  Yes Historical Provider, MD  LORazepam (ATIVAN) 0.5 MG tablet TK 1/2 T PO Q 4 H PRN FOR ANXIETY OR SHORTNESS OF BREATH 07/25/16   Yes Historical Provider, MD  Magnesium Oxide -Mg Supplement 250 MG TABS Take 1 tablet (250 mg total) by mouth at bedtime. --- Must have office visit for further refills. 10/25/16  Yes Binnie Rail, MD  MELATONIN PO Take 1 tablet by mouth at bedtime.   Yes Historical Provider, MD  Morphine Sulfate (MORPHINE CONCENTRATE) 10 mg / 0.5 ml concentrated solution TK 0.20 ML  PO Q 4 H PRN PAIN 06/16/16  Yes Historical Provider, MD  MUCUS RELIEF ER 600 MG 12 hr tablet Take 1 tablet by mouth in the morning and then take 1 at night 07/18/16  Yes Historical Provider, MD  [place] 24HR 20 MG capsule TAKE 1 CAPSULE BY MOUTH TWICE DAILY Patient taking differently: Take 2 by mouth once in the morning 09/06/16  Yes Tanda Rockers, MD  polyethylene glycol (MIRALAX / GLYCOLAX) packet Take 17 g by mouth daily as needed. Patient taking differently: Take 17 g by mouth daily as needed for mild constipation.  09/10/15  Yes Domenic Polite, MD  potassium chloride SA (K-DUR,KLOR-CON) 20 MEQ tablet Take 1 tablet (20 mEq total) by mouth daily. Patient taking differently: Take 40 mEq by mouth daily.  06/01/16  Yes Binnie Rail, MD  predniSONE (DELTASONE) 10 MG tablet TAKE 1 TABLET BY MOUTH EVERY DAY ALONG WITH 20 MG PREDNISONE TABLETS TO EQUAL 30MG  10/03/16  Yes Binnie Rail, MD  predniSONE (DELTASONE) 20 MG tablet Take 20 mg by mouth daily with breakfast. Take along with 10 mg tablet=30 mg daily 05/26/16  Yes Historical Provider, MD  sertraline (ZOLOFT) 100 MG tablet Take 1 tablet (100 mg total) by mouth daily. -- must have office visit for further refills 10/25/16  Yes Binnie Rail, MD  sodium chloride (OCEAN) 0.65 % SOLN nasal spray Place 1 spray into both nostrils 2 (two) times daily. Patient taking differently: Place 1 spray into both nostrils daily as needed for congestion.  08/20/14  Yes Marton Redwood, MD  cyclobenzaprine (FLEXERIL) 5 MG tablet Take 1 tablet (5 mg total) by mouth 2 (two) times daily as needed for muscle spasms.  07/28/16   Gwenyth Allegra Tegeler, MD  OXYGEN Oxygen 3lpm 24/7 DME- [facility]    Historical Provider, MD    Physical Exam: Vitals:   11/05/16 1130 11/05/16 1200  BP: (!) 128/51 121/61  Pulse: 85 90  Resp: 14 18  Temp:       General: Well developed, well nourished, NAD, appears stated age  HEENT: NCAT, PERRLA, EOMI, Anicteic Sclera, mucous membranes moist.   Neck: Supple, no JVD, no masses  Cardiovascular: S1 S2 auscultated, no rubs, murmurs or gallops. Regular rate and rhythm.  Respiratory: Faint crackles throughout. No wheezing.   Abdomen: Soft, nontender, nondistended, + bowel sounds  Extremities: warm  dry without cyanosis clubbing or edema  Neuro: AAOx3, cranial nerves grossly intact. Strength equal and bilateral in upper/lower extremities  Skin: Without rashes exudates or nodules  Psych: Normal affect and demeanor with intact judgement and insight  Labs on Admission: I have personally reviewed following labs and imaging studies CBC:  Recent Labs Lab 11/05/16 1039  WBC 12.4*  NEUTROABS 10.1*  HGB 12.1  HCT 38.8  MCV 88.4  PLT PLATELET CLUMPS NOTED ON SMEAR, COUNT APPEARS ADEQUATE   Basic Metabolic Panel:  Recent Labs Lab 11/05/16 1039  NA 135  K 4.8  CL 98*  CO2 32  GLUCOSE 100*  BUN 16  CREATININE 0.56  CALCIUM 10.0   GFR: CrCl cannot be calculated (Unknown ideal weight.). Liver Function Tests:  Recent Labs Lab 11/05/16 1039  AST 21  ALT 16  ALKPHOS 54  BILITOT 0.8  PROT 6.5  ALBUMIN 3.2*   No results for input(s): LIPASE, AMYLASE in the last 168 hours. No results for input(s): AMMONIA in the last 168 hours. Coagulation Profile: No results for input(s): INR, PROTIME in the last 168 hours. Cardiac Enzymes: No results for input(s): CKTOTAL, CKMB, CKMBINDEX, TROPONINI in the last 168 hours. BNP (last 3 results)  Recent Labs  01/06/16 1422  PROBNP 362.0*   HbA1C: No results for input(s): HGBA1C in the last 72  hours. CBG: No results for input(s): GLUCAP in the last 168 hours. Lipid Profile: No results for input(s): CHOL, HDL, LDLCALC, TRIG, CHOLHDL, LDLDIRECT in the last 72 hours. Thyroid Function Tests: No results for input(s): TSH, T4TOTAL, FREET4, T3FREE, THYROIDAB in the last 72 hours. Anemia Panel: No results for input(s): VITAMINB12, FOLATE, FERRITIN, TIBC, IRON, RETICCTPCT in the last 72 hours. Urine analysis:    Component Value Date/Time   COLORURINE YELLOW 11/05/2016 1116   APPEARANCEUR HAZY (A) 11/05/2016 1116   LABSPEC 1.013 11/05/2016 1116   PHURINE 7.0 11/05/2016 1116   GLUCOSEU NEGATIVE 11/05/2016 1116   HGBUR SMALL (A) 11/05/2016 1116   BILIRUBINUR NEGATIVE 11/05/2016 1116   BILIRUBINUR negative 03/23/2016 1030   KETONESUR NEGATIVE 11/05/2016 1116   PROTEINUR NEGATIVE 11/05/2016 1116   UROBILINOGEN 0.2 03/23/2016 1030   UROBILINOGEN 0.2 09/05/2015 1300   NITRITE NEGATIVE 11/05/2016 1116   LEUKOCYTESUR LARGE (A) 11/05/2016 1116   Sepsis Labs: @LABRCNTIP (procalcitonin:4,lacticidven:4) )No results found for this or any previous visit (from the past 240 hour(s)).   Radiological Exams on Admission: Dg Chest 2 View  Result Date: 11/05/2016 CLINICAL DATA:  Productive cough. EXAM: CHEST  2 VIEW COMPARISON:  Radiographs of July 28, 2016. FINDINGS: The heart size and mediastinal contours are within normal limits. No pneumothorax or pleural effusion is noted. Mild dextroscoliosis of upper thoracic spine is noted. Atherosclerosis of thoracic aorta is noted. Stable diffuse interstitial densities are noted throughout both lungs consistent with scarring. IMPRESSION: Stable diffuse interstitial densities noted throughout both lungs consistent with scarring or chronic interstitial lung disease. No significant change compared to prior exam. Aortic atherosclerosis. Electronically Signed   By: Marijo Conception, M.D.   On: 11/05/2016 10:37    EKG: Independently reviewed. Sinus rhythm,  rate 93  Assessment/Plan  Generalized weakness -Likely secondary to UTI -PT consulted -Patient followed by hospice at home -TSH pending  UTI -UA: ENT see WBC, no bacteria seen, large leukocytes -Patient recently treated for urinary tract infection 2 weeks ago with ampicillin -Urine culture and blood cultures pending -Continue ceftriaxone  Leukocytosis  -Likely secondary to urinary tract infection, continue to monitor CBC  Essential hypertension -Continue lasix  GERD -Continue PPI and H2 blocker  Chronic diastolic heart failure -Currently stable, euvolemic -Last echocardiogram 08/02/15 showed an EF of 123456, grade 1 diastolic dysfunction -continue Lasix -Monitor intake and output, daily weights  Pulmonary fibrosis -On home oxygen, 4 L -Followed by hospice -Continue home medications including ;prednisone, morphine, Ativan, Haldol  DVT prophylaxis: Lovenox  Code Status: DNR  Family Communication: Brother at bedside. Admission, patients condition and plan of care including tests being ordered have been discussed with the patient and brother who indicate understanding and agree with the plan and Code Status.  Disposition Plan: Home    Consults called: None   Admission status: Observation   Time spent: 60 minutes  Devan Babino D.O. [facility] Pager [phone]  If 7PM-7AM, please contact night-coverage [email or web address] Password [facility] [place] 11/05/2016, 12:53 PM

## 2016-11-06 DIAGNOSIS — Z853 Personal history of malignant neoplasm of breast: Secondary | ICD-10-CM | POA: Diagnosis not present

## 2016-11-06 DIAGNOSIS — I2510 Atherosclerotic heart disease of native coronary artery without angina pectoris: Secondary | ICD-10-CM | POA: Diagnosis not present

## 2016-11-06 DIAGNOSIS — Z90710 Acquired absence of both cervix and uterus: Secondary | ICD-10-CM | POA: Diagnosis not present

## 2016-11-06 DIAGNOSIS — Z9981 Dependence on supplemental oxygen: Secondary | ICD-10-CM | POA: Diagnosis not present

## 2016-11-06 DIAGNOSIS — D72829 Elevated white blood cell count, unspecified: Secondary | ICD-10-CM | POA: Diagnosis not present

## 2016-11-06 DIAGNOSIS — Z87440 Personal history of urinary (tract) infections: Secondary | ICD-10-CM | POA: Diagnosis not present

## 2016-11-06 DIAGNOSIS — Z9011 Acquired absence of right breast and nipple: Secondary | ICD-10-CM | POA: Diagnosis not present

## 2016-11-06 DIAGNOSIS — M5136 Other intervertebral disc degeneration, lumbar region: Secondary | ICD-10-CM | POA: Diagnosis present

## 2016-11-06 DIAGNOSIS — Z9049 Acquired absence of other specified parts of digestive tract: Secondary | ICD-10-CM | POA: Diagnosis not present

## 2016-11-06 DIAGNOSIS — N390 Urinary tract infection, site not specified: Secondary | ICD-10-CM | POA: Diagnosis not present

## 2016-11-06 DIAGNOSIS — I10 Essential (primary) hypertension: Secondary | ICD-10-CM | POA: Diagnosis not present

## 2016-11-06 DIAGNOSIS — J8410 Pulmonary fibrosis, unspecified: Secondary | ICD-10-CM | POA: Diagnosis not present

## 2016-11-06 DIAGNOSIS — Z79899 Other long term (current) drug therapy: Secondary | ICD-10-CM | POA: Diagnosis not present

## 2016-11-06 DIAGNOSIS — R531 Weakness: Secondary | ICD-10-CM

## 2016-11-06 DIAGNOSIS — I5032 Chronic diastolic (congestive) heart failure: Secondary | ICD-10-CM | POA: Diagnosis not present

## 2016-11-06 DIAGNOSIS — J84112 Idiopathic pulmonary fibrosis: Secondary | ICD-10-CM | POA: Diagnosis present

## 2016-11-06 DIAGNOSIS — Z8249 Family history of ischemic heart disease and other diseases of the circulatory system: Secondary | ICD-10-CM | POA: Diagnosis not present

## 2016-11-06 DIAGNOSIS — Z66 Do not resuscitate: Secondary | ICD-10-CM | POA: Diagnosis present

## 2016-11-06 DIAGNOSIS — I110 Hypertensive heart disease with heart failure: Secondary | ICD-10-CM | POA: Diagnosis not present

## 2016-11-06 DIAGNOSIS — K219 Gastro-esophageal reflux disease without esophagitis: Secondary | ICD-10-CM | POA: Diagnosis not present

## 2016-11-06 DIAGNOSIS — Z7982 Long term (current) use of aspirin: Secondary | ICD-10-CM | POA: Diagnosis not present

## 2016-11-06 DIAGNOSIS — G8929 Other chronic pain: Secondary | ICD-10-CM | POA: Diagnosis present

## 2016-11-06 LAB — BASIC METABOLIC PANEL
Anion gap: 7 (ref 5–15)
BUN: 14 mg/dL (ref 6–20)
CALCIUM: 9.7 mg/dL (ref 8.9–10.3)
CO2: 31 mmol/L (ref 22–32)
CREATININE: 0.48 mg/dL (ref 0.44–1.00)
Chloride: 98 mmol/L — ABNORMAL LOW (ref 101–111)
GFR calc Af Amer: >60 mL/min (ref >60)
GFR calc non Af Amer: >60 mL/min (ref >60)
GLUCOSE: 85 mg/dL (ref 65–99)
Potassium: 4.4 mmol/L (ref 3.5–5.1)
Sodium: 136 mmol/L (ref 135–145)

## 2016-11-06 LAB — CBC
HCT: 37 % (ref 36.0–46.0)
Hemoglobin: 11.5 g/dL — ABNORMAL LOW (ref 12.0–15.0)
MCH: 27.9 pg (ref 26.0–34.0)
MCHC: 31.1 g/dL (ref 30.0–36.0)
MCV: 89.8 fL (ref 78.0–100.0)
PLATELETS: UNDETERMINED 10*3/uL (ref 150–400)
RBC: 4.12 MIL/uL (ref 3.87–5.11)
RDW: 16.4 % — AB (ref 11.5–15.5)
WBC: 10.8 10*3/uL — ABNORMAL HIGH (ref 4.0–10.5)

## 2016-11-06 LAB — URINE CULTURE

## 2016-11-06 LAB — TSH: TSH: 1.392 u[IU]/mL (ref 0.350–4.500)

## 2016-11-06 MED ORDER — LIP MEDEX EX OINT
TOPICAL_OINTMENT | CUTANEOUS | Status: AC
  Administered 2016-11-06: 03:00:00
  Filled 2016-11-06: qty 7

## 2016-11-06 NOTE — Progress Notes (Signed)
[facility]  PROGRESS NOTE  [name] [id number] DOB: [birth year] DOA: 11/05/2016 PCP: Binnie Rail, MD   Brief HPI:    83 y.o. female with a medical history of pulmonary fibrosis on 4 and half liters of O2 at home, GERD, hypertension, chronic diastolic heart failure, who presented to the emergency department with complaints of weakness and painful urination. Patient states she had a urinary tract infection approximate 2 weeks ago was treated with ampicillin as an outpatient. She currently has been having burning with urination with some occasional incontinence however the incontinence is not new. She feels she takes too much Lasix. She does live at home alone and has been feeling increasingly weak over the last several days. She attempted to call hospice however did not get a lot of person on the phone. Patient thought she may fall, she came to the emergency department. Currently she denies any chest pain, abnormalities with her breathing, dizziness or headache, nausea or vomiting, diarrhea or constipation. She denies any recent ill or sick contacts or travel.   Subjective   Patient seen and examined, denies shortness of breath. No dysuria   Assessment/Plan:     Generalized weakness -Likely secondary to UTI -PT consulted -Patient followed by hospice at home -TSH 1.392  UTI -UA: ENT see WBC, no bacteria seen, large leukocytes -Patient recently treated for urinary tract infection 2 weeks ago with ampicillin -Urine culture and blood cultures pending -Continue ceftriaxone  Leukocytosis  -Likely secondary to urinary tract infection, continue to monitor CBC  Essential hypertension -Continue lasix  GERD -Continue PPI and H2 blocker  Chronic diastolic heart failure -Currently stable, euvolemic -Last echocardiogram 08/02/15 showed an EF of 123456, grade 1 diastolic dysfunction -continue Lasix -Monitor intake and output, daily weights  Pulmonary fibrosis -On  home oxygen, 4 L. - On 2 l /min oxygen in the hospital -Followed by hospice -Continue home medications including ;prednisone, morphine, Ativan, Haldol      DVT prophylaxis: *Lovenox  Code Status: DO NOT RESUSCITATE  Family Communication: No family present at bedside   Disposition Plan: Pending results of urine culture, will discharge on by mouth antibiotics   Consultants:  None  Procedures:  None  Continuous infusions     Antibiotics:   Anti-infectives    Start     Dose/Rate Route Frequency Ordered Stop   11/06/16 1200  cefTRIAXone (ROCEPHIN) 1 g in dextrose 5 % 50 mL IVPB     1 g 100 mL/hr over 30 Minutes Intravenous Every 24 hours 11/05/16 1425     11/05/16 1230  cefTRIAXone (ROCEPHIN) 1 g in dextrose 5 % 50 mL IVPB     1 g 100 mL/hr over 30 Minutes Intravenous  Once 11/05/16 1222 11/05/16 1332       Objective   Vitals:   11/05/16 2114 11/05/16 2115 11/06/16 0657 11/06/16 1329  BP: (!) 145/52  (!) 140/49 (!) 123/46  Pulse: 84  89 (!) 104  Resp: 15  16 15   Temp: 98.7 F (37.1 C)  97.5 F (36.4 C) 97.8 F (36.6 C)  TempSrc: Oral  Oral Oral  SpO2: 99%  95% 98%  Weight:  67 kg (147 lb 12.8 oz)    Height:  5\' 1"  (1.549 m)      Intake/Output Summary (Last 24 hours) at 11/06/16 1359 Last data filed at 11/06/16 1330  Gross per 24 hour  Intake             1720 ml  Output  300 ml  Net             1420 ml   Filed Weights   11/05/16 2115  Weight: 67 kg (147 lb 12.8 oz)     Physical Examination:  General exam: Appears calm and comfortable. Respiratory system: Decreased breath sounds at lung bases Cardiovascular system:  RRR. No  murmurs, rubs, gallops. No pedal edema. GI system: Abdomen is nondistended, soft and nontender. No organomegaly.  Central nervous system. No focal neurological deficits. 5 x 5 power in all extremities. Skin: No rashes, lesions or ulcers. Psychiatry: Alert, oriented x 3.Judgement and insight appear normal.  Affect normal.    Data Reviewed: I have personally reviewed following labs and imaging studies  CBG: No results for input(s): GLUCAP in the last 168 hours.  CBC:  Recent Labs Lab 11/05/16 1039 11/06/16 0419  WBC 12.4* 10.8*  NEUTROABS 10.1*  --   HGB 12.1 11.5*  HCT 38.8 37.0  MCV 88.4 89.8  PLT PLATELET CLUMPS NOTED ON SMEAR, COUNT APPEARS ADEQUATE PLATELET CLUMPS NOTED ON SMEAR, UNABLE TO ESTIMATE    Basic Metabolic Panel:  Recent Labs Lab 11/05/16 1039 11/06/16 0419  NA 135 136  K 4.8 4.4  CL 98* 98*  CO2 32 31  GLUCOSE 100* 85  BUN 16 14  CREATININE 0.56 0.48  CALCIUM 10.0 9.7    Recent Results (from the past 240 hour(s))  Urine culture     Status: Abnormal   Collection Time: 11/05/16 11:16 AM  Result Value Ref Range Status   Specimen Description URINE, CLEAN CATCH  Final   Special Requests NONE  Final   Culture MULTIPLE SPECIES PRESENT, SUGGEST RECOLLECTION (A)  Final   Report Status 11/06/2016 FINAL  Final     Liver Function Tests:  Recent Labs Lab 11/05/16 1039  AST 21  ALT 16  ALKPHOS 54  BILITOT 0.8  PROT 6.5  ALBUMIN 3.2*   No results for input(s): LIPASE, AMYLASE in the last 168 hours. No results for input(s): AMMONIA in the last 168 hours.  Cardiac Enzymes: No results for input(s): CKTOTAL, CKMB, CKMBINDEX, TROPONINI in the last 168 hours. BNP (last 3 results) No results for input(s): BNP in the last 8760 hours.  ProBNP (last 3 results)  Recent Labs  01/06/16 1422  PROBNP 362.0*      Studies: Dg Chest 2 View  Result Date: 11/05/2016 CLINICAL DATA:  Productive cough. EXAM: CHEST  2 VIEW COMPARISON:  Radiographs of July 28, 2016. FINDINGS: The heart size and mediastinal contours are within normal limits. No pneumothorax or pleural effusion is noted. Mild dextroscoliosis of upper thoracic spine is noted. Atherosclerosis of thoracic aorta is noted. Stable diffuse interstitial densities are noted throughout both lungs  consistent with scarring. IMPRESSION: Stable diffuse interstitial densities noted throughout both lungs consistent with scarring or chronic interstitial lung disease. No significant change compared to prior exam. Aortic atherosclerosis. Electronically Signed   By: Marijo Conception, M.D.   On: 11/05/2016 10:37    Scheduled Meds: . aspirin  325 mg Oral Daily  . cefTRIAXone (ROCEPHIN)  IV  1 g Intravenous Q24H  . cholecalciferol  2,000 Units Oral Daily  . enoxaparin (LOVENOX) injection  40 mg Subcutaneous Q24H  . famotidine  20 mg Oral QHS  . furosemide  40 mg Oral q morning - 10a  . magnesium oxide  400 mg Oral QHS  . pantoprazole  40 mg Oral Daily  . potassium chloride SA  40 mEq Oral  Daily  . predniSONE  10 mg Oral Q breakfast  . predniSONE  20 mg Oral Q breakfast  . sertraline  100 mg Oral Daily  . sodium chloride flush  3 mL Intravenous Q12H      Time spent: 25 min  [facility] Pager [phone]. If 7PM-7AM, please contact night-coverage at [email or web address], Office  [phone]  password [email or web address] 11/06/2016, 1:59 PM  LOS: 0 days

## 2016-11-06 NOTE — Evaluation (Signed)
Physical Therapy Evaluation Patient Details Name: [name] MRN: [id number] DOB: [birth year] Today's Date: 11/06/2016   History of Present Illness  [name] is a 83 y.o. female with a medical history of pulmonary fibrosis on 4 and half liters of O2 at home, GERD, hypertension, chronic diastolic heart failure, who presented to the emergency department with complaints of weakness and painful urination.  Clinical Impression  Pt admitted with above diagnosis. Pt currently with functional limitations due to the deficits listed below (see PT Problem List).  Pt will benefit from skilled PT to increase their independence and safety with mobility to allow discharge to the venue listed below.   Pt on 3L O2, reports using 4L at home; O2 sats 91% - 94% during PT session; pt states she would like to go  to rehab or increase her assistance at home; she does fatigue quickly; unsure if she will be able to qualify for rehab or receive more than a few HHPT visits since she is a hospice pt; pt reporst she has been getting Hospice services for "years", she states at least since 2015     Follow Up Recommendations Home health PT;SNF (depending on progress-Pt is on Hospice-may not be able to get either (?))    Equipment Recommendations  Other (comment)    Recommendations for Other Services       Precautions / Restrictions Precautions Precautions: Fall Precaution Comments: monitor O2 Restrictions Weight Bearing Restrictions: No      Mobility  Bed Mobility Overal bed mobility: Needs Assistance Bed Mobility: Supine to Sit;Sit to Supine     Supine to sit: Supervision Sit to supine: Supervision   General bed mobility comments: for safety, no physical assist  Transfers Overall transfer level: Needs assistance Equipment used: Rolling walker (2 wheeled) Transfers: Sit to/from Stand Sit to Stand: Min guard         General transfer comment: cues for hand  placement  Ambulation/Gait Ambulation/Gait assistance: Min guard   Assistive device: Rolling walker (2 wheeled) Gait Pattern/deviations: Step-through pattern;Decreased stride length;Trunk flexed Gait velocity: decr   General Gait Details: cues for RW position  Stairs            Wheelchair Mobility    Modified Rankin (Stroke Patients Only)       Balance Overall balance assessment: Needs assistance Sitting-balance support: Feet supported Sitting balance-Leahy Scale: Good       Standing balance-Leahy Scale: Fair Standing balance comment: able to maintain balance briefly without assist, transitions to RW without LOB                             Pertinent Vitals/Pain Pain Assessment: No/denies pain    Home Living Family/patient expects to be discharged to:: Private residence Living Arrangements: Alone Available Help at Discharge: Other (Comment) (Hospice 3d/wk)           Home Equipment: Walker - 2 wheels;Cane - single point;Shower seat;Grab bars - tub/shower Additional Comments: pt reports she does not use the cane or walker because she gets tangled up in the O2 line    Prior Function Level of Independence: Needs assistance   Gait / Transfers Assistance Needed: independent household amb/furniture walks, does not use cane or walker when going out but reports she fatigues rapidly  ADL's / Homemaking Assistance Needed: "Hospice" assists with shower, pt reports Hospice RN does meds; has help from [employer] and friends for meals, she freezes portions and reheats  them;   Comments: she is still driving     Hand Dominance        Extremity/Trunk Assessment   Upper Extremity Assessment Upper Extremity Assessment: Defer to OT evaluation    Lower Extremity Assessment Lower Extremity Assessment: Generalized weakness       Communication   Communication: No difficulties  Cognition Arousal/Alertness: Awake/alert Behavior During Therapy: WFL for tasks  assessed/performed Overall Cognitive Status: Within Functional Limits for tasks assessed                      General Comments      Exercises     Assessment/Plan    PT Assessment Patient needs continued PT services  PT Problem List Decreased strength;Decreased activity tolerance;Decreased balance;Decreased mobility;Cardiopulmonary status limiting activity          PT Treatment Interventions DME instruction;Functional mobility training;Gait training;Therapeutic activities;Therapeutic exercise;Patient/family education    PT Goals (Current goals can be found in the Care Plan section)  Acute Rehab PT Goals Patient Stated Goal: pt is concerned about managing at home, wants to get stronger PT Goal Formulation: With patient Time For Goal Achievement: 11-20-16    Frequency Min 3X/week   Barriers to discharge Decreased caregiver support      Co-evaluation               End of Session Equipment Utilized During Treatment: Gait belt;Oxygen Activity Tolerance: Patient tolerated treatment well Patient left: with call bell/phone within reach;in bed;with bed alarm set (pt declined to sit up) Nurse Communication: Mobility status         Time: [id number] PT Time Calculation (min) (ACUTE ONLY): 28 min   Charges:   PT Evaluation $PT Eval Low Complexity: 1 Procedure PT Treatments $Gait Training: 8-22 mins   PT G Codes:        Shantavia Jha 11/20/2016, 1:54 PM

## 2016-11-07 NOTE — Consult Note (Signed)
[facility]--Pt has been active with [facility] since August of 2017 for pulmonary fibrosis.  Has had issues with recurrent UTI's at home for which she has required multiple rounds of oral antibiotics since being admitted to Hospice.  She lives alone at her home in [place].  Visited her at the bedside today to find pt in bed eating her lunch tray.  She reports feeling better since coming to the hospital but feels  very weak.  We discussed d/c options including back home with Hospice support in her home.  Pt verbalizes interest in inpt. Rehab for strengthening before going back home.  We discussed this option and the fact that Hospice would certainly support her in this endeavor but this would require revocation of the Hospice Medicare Benefit to pursue therapy.  Pt plans to discuss options further with her brother Clair Gulling who will visit her after work today sometime after 4pm.  Pt has agreed for Hospice RN to reach out to [facility] to begin SNF search.  Will revisit pt at bedside to f/u on the status of d/c dispo.  Wynetta Fines, RN  [phone]

## 2016-11-07 NOTE — Progress Notes (Signed)
Physical Therapy Treatment Patient Details Name: [name] MRN: [id number] DOB: [birth year] Today's Date: 11/07/2016    History of Present Illness [name] is a 83 y.o. female with a medical history of pulmonary fibrosis on 4 and half liters of O2 at home, GERD, hypertension, chronic diastolic heart failure, who presented to the emergency department with complaints of weakness and painful urination.    PT Comments    The patient  Is not a  A functional level to DC to home alone. Can benefit from short term SNF rehab. Patient reports good results from previous stay. Continue PT while in acute care.  Follow Up Recommendations  SNF;Supervision/Assistance - 24 hour     Equipment Recommendations  None recommended by PT    Recommendations for Other Services       Precautions / Restrictions Precautions Precautions: Fall Precaution Comments: monitor O2, on 4 l at home    Mobility  Bed Mobility   Bed Mobility: Supine to Sit;Sit to Supine     Supine to sit: Min assist Sit to supine: Supervision      Transfers Overall transfer level: Needs assistance Equipment used: 1 person hand held assist Transfers: Sit to/from [facility] Sit to Stand: Min assist Stand pivot transfers: Min assist       General transfer comment: HHA to stand and pivot to recliner and back to bed.  Ambulation/Gait                 Stairs            Wheelchair Mobility    Modified Rankin (Stroke Patients Only)       Balance                                    Cognition Arousal/Alertness: Awake/alert                          Exercises      General Comments        Pertinent Vitals/Pain Pain Assessment: No/denies pain    Home Living                      Prior Function            PT Goals (current goals can now be found in the care plan section) Progress towards PT goals: Progressing toward goals     Frequency    Min 3X/week      PT Plan Discharge plan needs to be updated    Co-evaluation             End of Session Equipment Utilized During Treatment: Oxygen Activity Tolerance: Patient tolerated treatment well Patient left: in bed;with call bell/phone within reach;with bed alarm set     Time: 1540-1605 PT Time Calculation (min) (ACUTE ONLY): 25 min  Charges:  $Therapeutic Exercise: 23-37 mins                    G Codes:      Claretha Cooper 11/07/2016, 4:42 PM Tresa Endo PT [phone]

## 2016-11-07 NOTE — Progress Notes (Addendum)
Per CSW, SNF not an option d/t not having qualifying 3 night stay. However patient's brother states he is willing to pay out of pocket for [facility] SNF. Contacted CSW to arrange, PT to see patient again for evaluation. Spoke with [facility] regarding arrangements that were being made for [facility], awaiting call back.

## 2016-11-07 NOTE — Progress Notes (Signed)
PT Cancellation Note  Patient Details Name: [name] MRN: [id number] DOB: [birth year]   Cancelled Treatment:    Reason Eval/Treat Not Completed: Fatigue/lethargy limiting ability to participate (she just completed a bath and is in recliner. She says that she is too weak  to do therapy.  will check back another tiime.)   Claretha Cooper 11/07/2016, 8:58 AM Tresa Endo PT [phone]

## 2016-11-07 NOTE — Progress Notes (Signed)
Spoke with patient at bedside. States she lives at home alone but has access to people who can provide full time care/assistance, her brother can assist with transportation. She prefers to return home but feels she is too weak at this time. She has all the equipment she needs, has home O2 in place. Spoke with representative at [place] [place], the plan to see her today. If she wants SNF for rehab she will need to revoke hospice until she leaves SNF then they could come back to see her. Will f/u when decision to d/c is made.

## 2016-11-07 NOTE — Progress Notes (Signed)
CSW spoke with hospice liaison and RNCM to assist with d/c planning. Pt is interested in [place]  at [facility] for SNF placement. RNCM has explained to pt / brother  that pt has not had qualifying stay yet to use medicare for placement. Pt is aware placement may be pvt pay. [facility] has been contacted and a decision is pending.  Werner Lean LCSW [phone]

## 2016-11-07 NOTE — Progress Notes (Signed)
[facility]  PROGRESS NOTE  [name] [id number] DOB: [birth year] DOA: 11/05/2016 PCP: Binnie Rail, MD   Brief HPI:    83 y.o. female with a medical history of pulmonary fibrosis on 4 and half liters of O2 at home, GERD, hypertension, chronic diastolic heart failure, who presented to the emergency department with complaints of weakness and painful urination. Patient states she had a urinary tract infection approximate 2 weeks ago was treated with ampicillin as an outpatient. She currently has been having burning with urination with some occasional incontinence however the incontinence is not new. She feels she takes too much Lasix. She does live at home alone and has been feeling increasingly weak over the last several days. She attempted to call hospice however did not get a lot of person on the phone. Patient thought she may fall, she came to the emergency department. Currently she denies any chest pain, abnormalities with her breathing, dizziness or headache, nausea or vomiting, diarrhea or constipation. She denies any recent ill or sick contacts or travel.   Subjective   Patient seen and examined, denies shortness of breath. No dysuria. Urine culture only growing contaminants.   Assessment/Plan:     Generalized weakness -PT consulted - Patient has chronic low back pain, from severe disc degenerative disease. - At this time continue with morphine when necessary -Patient followed by hospice at home -TSH 1.392  ? UTI - Will discontinue ceftriaxone, uterine culture growing contaminants. -UA: ENT see WBC, no bacteria seen, large leukocytes -Patient recently treated for urinary tract infection 2 weeks ago with ampicillin  Leukocytosis  -WBC reduced to 10.8  -Likely from prednisone   Essential hypertension -Continue lasix  GERD -Continue PPI and H2 blocker  Chronic diastolic heart failure -Currently stable, euvolemic -Last echocardiogram 08/02/15 showed an  EF of 123456, grade 1 diastolic dysfunction -continue Lasix -Monitor intake and output, daily weights  Pulmonary fibrosis -On home oxygen, 4 L. - On 2 l /min oxygen in the hospital -Followed by hospice -Continue home medications including ;prednisone, morphine, Ativan, Haldol      DVT prophylaxis: *Lovenox  Code Status: DO NOT RESUSCITATE  Family Communication: No family present at bedside   Disposition Plan Patient wants to go to skilled facility for rehabilitation  Anti-infectives    Start     Dose/Rate Route Frequency Ordered Stop   11/06/16 1200  cefTRIAXone (ROCEPHIN) 1 g in dextrose 5 % 50 mL IVPB     1 g 100 mL/hr over 30 Minutes Intravenous Every 24 hours 11/05/16 1425     11/05/16 1230  cefTRIAXone (ROCEPHIN) 1 g in dextrose 5 % 50 mL IVPB     1 g 100 mL/hr over 30 Minutes Intravenous  Once 11/05/16 1222 11/05/16 1332       Objective   Vitals:   11/06/16 1329 11/06/16 2155 11/07/16 0544 11/07/16 1550  BP: (!) 123/46 (!) 142/53 (!) 168/56 (!) 145/65  Pulse: (!) 104 92 95 87  Resp: 15 16 16 16   Temp: 97.8 F (36.6 C) 98.5 F (36.9 C) 98.2 F (36.8 C) 98.4 F (36.9 C)  TempSrc: Oral Oral Oral Oral  SpO2: 98% 98% 96% 100%  Weight:   64.1 kg (141 lb 4.8 oz)   Height:        Intake/Output Summary (Last 24 hours) at 11/07/16 1635 Last data filed at 11/07/16 0936  Gross per 24 hour  Intake  843 ml  Output                0 ml  Net              843 ml   Filed Weights   11/05/16 2115 11/07/16 0544  Weight: 67 kg (147 lb 12.8 oz) 64.1 kg (141 lb 4.8 oz)     Physical Examination:  General exam: Appears calm and comfortable. Respiratory system: Decreased breath sounds at lung bases Cardiovascular system:  RRR. No  murmurs, rubs, gallops. No pedal edema. GI system: Abdomen is nondistended, soft and nontender. No organomegaly.  Central nervous system. No focal neurological deficits. 5 x 5 power in all extremities. Skin: No rashes, lesions  or ulcers. Psychiatry: Alert, oriented x 3.Judgement and insight appear normal. Affect normal.    Data Reviewed: I have personally reviewed following labs and imaging studies  CBG: No results for input(s): GLUCAP in the last 168 hours.  CBC:  Recent Labs Lab 11/05/16 1039 11/06/16 0419  WBC 12.4* 10.8*  NEUTROABS 10.1*  --   HGB 12.1 11.5*  HCT 38.8 37.0  MCV 88.4 89.8  PLT PLATELET CLUMPS NOTED ON SMEAR, COUNT APPEARS ADEQUATE PLATELET CLUMPS NOTED ON SMEAR, UNABLE TO ESTIMATE    Basic Metabolic Panel:  Recent Labs Lab 11/05/16 1039 11/06/16 0419  NA 135 136  K 4.8 4.4  CL 98* 98*  CO2 32 31  GLUCOSE 100* 85  BUN 16 14  CREATININE 0.56 0.48  CALCIUM 10.0 9.7    Recent Results (from the past 240 hour(s))  Urine culture     Status: Abnormal   Collection Time: 11/05/16 11:16 AM  Result Value Ref Range Status   Specimen Description URINE, CLEAN CATCH  Final   Special Requests NONE  Final   Culture MULTIPLE SPECIES PRESENT, SUGGEST RECOLLECTION (A)  Final   Report Status 11/06/2016 FINAL  Final  Culture, blood (routine x 2)     Status: None (Preliminary result)   Collection Time: 11/05/16  2:44 PM  Result Value Ref Range Status   Specimen Description BLOOD LEFT ANTECUBITAL  Final   Special Requests IN PEDIATRIC BOTTLE 3CC  Final   Culture   Final    NO GROWTH 2 DAYS Performed at [facility]    Report Status PENDING  Incomplete  Culture, blood (routine x 2)     Status: None (Preliminary result)   Collection Time: 11/05/16  2:44 PM  Result Value Ref Range Status   Specimen Description BLOOD LEFT HAND  Final   Special Requests IN PEDIATRIC BOTTLE 3 CC  Final   Culture   Final    NO GROWTH 2 DAYS Performed at [facility]    Report Status PENDING  Incomplete     Liver Function Tests:  Recent Labs Lab 11/05/16 1039  AST 21  ALT 16  ALKPHOS 54  BILITOT 0.8  PROT 6.5  ALBUMIN 3.2*   No results for input(s): LIPASE, AMYLASE in the  last 168 hours. No results for input(s): AMMONIA in the last 168 hours.  Cardiac Enzymes: No results for input(s): CKTOTAL, CKMB, CKMBINDEX, TROPONINI in the last 168 hours. BNP (last 3 results) No results for input(s): BNP in the last 8760 hours.  ProBNP (last 3 results)  Recent Labs  01/06/16 1422  PROBNP 362.0*      Studies: No results found.  Scheduled Meds: . aspirin  325 mg Oral Daily  . cefTRIAXone (ROCEPHIN)  IV  1  g Intravenous Q24H  . cholecalciferol  2,000 Units Oral Daily  . enoxaparin (LOVENOX) injection  40 mg Subcutaneous Q24H  . famotidine  20 mg Oral QHS  . furosemide  40 mg Oral q morning - 10a  . magnesium oxide  400 mg Oral QHS  . pantoprazole  40 mg Oral Daily  . potassium chloride SA  40 mEq Oral Daily  . predniSONE  10 mg Oral Q breakfast  . predniSONE  20 mg Oral Q breakfast  . sertraline  100 mg Oral Daily  . sodium chloride flush  3 mL Intravenous Q12H      Time spent: 25 min  [facility] Pager [phone]. If 7PM-7AM, please contact night-coverage at [email or web address], Office  [phone]  password [email or web address] 11/07/2016, 4:35 PM  LOS: 1 day

## 2016-11-07 NOTE — NC FL2 (Signed)
[place] LEVEL OF CARE SCREENING TOOL     IDENTIFICATION  Patient Name: [name] Birthdate: [birth year] Sex: female Admission Date (Current Location): 11/05/2016  [facility] and [place] Number:  Herbalist and Address:  [facility],  [place] [address] [place]      Provider Number: [id number]  Attending Physician Name and Address:  Oswald Hillock, MD  Relative Name and Phone Number:       Current Level of Care: Hospital Recommended Level of Care: [place] Prior Approval Number:    Date Approved/Denied:   PASRR Number: [id number]  Discharge Plan: SNF    Current Diagnoses: Patient Active Problem List   Diagnosis Date Noted  . Generalized weakness 11/06/2016  . UTI (urinary tract infection) 11/05/2016  . Left shoulder pain 08/03/2016  . Cough 05/28/2016  . Physical deconditioning 04/20/2016  . Weakness generalized   . Dysphagia   . Pyrexia   . Sepsis ([place]) 09/05/2015  . Palliative care encounter 09/03/2015  . DNR (do not resuscitate) 09/03/2015  . Bronchitis, chronic obstructive w acute bronchitis ([place]) 09/02/2015  . HCAP (healthcare-associated pneumonia) 09/01/2015  . Dyspnea 08/31/2015  . Pain   . Depression 08/22/2015  . Chronic hyponatremia 08/03/2015  . Chronic diastolic CHF (congestive heart failure) ([place]) 08/01/2015  . SOB (shortness of breath) 08/01/2015  . Chronic respiratory failure with hypoxia ([place]) 07/28/2015  . Obesity 07/01/2015  . Hyperlipidemia 09/17/2014  . Coronary artery disease 08/17/2014  . Leukocytosis 08/17/2014  . Protein-calorie malnutrition ([place]) 08/17/2014  . Postinflammatory pulmonary fibrosis ([place]) 08/16/2014  . Change in bowel habits 07/24/2012  . Special screening for malignant neoplasms, colon 12/08/2011  . Stricture and stenosis of esophagus 12/08/2011  . NEOPLASM, MALIGNANT, RIGHT BREAST 11/28/2010  . OBSTRUCTIVE SLEEP APNEA 11/28/2010  . Essential  hypertension 11/28/2010  . ALLERGIC RHINITIS 11/28/2010  . GERD 11/28/2010    Orientation RESPIRATION BLADDER Height & Weight     Self, Time, Situation, Place  O2 Continent Weight: 141 lb 4.8 oz (64.1 kg) Height:  5\' 1"  (154.9 cm)  BEHAVIORAL SYMPTOMS/MOOD NEUROLOGICAL BOWEL NUTRITION STATUS  Other (Comment) (no behaviors)   Continent Diet  AMBULATORY STATUS COMMUNICATION OF NEEDS Skin   Limited Assist Verbally Normal                       Personal Care Assistance Level of Assistance  Bathing, Feeding, Dressing Bathing Assistance: Limited assistance Feeding assistance: Independent Dressing Assistance: Limited assistance     Functional Limitations Info  Sight, Hearing, Speech Sight Info: Adequate Hearing Info: Adequate Speech Info: Adequate    SPECIAL CARE FACTORS FREQUENCY  PT (By licensed PT), OT (By licensed OT)     PT Frequency: 5x wk OT Frequency: 5x wk            Contractures Contractures Info: Not present    Additional Factors Info  Code Status, Psychotropic Code Status Info: DNR   Psychotropic Info: zoloft         Current Medications (11/07/2016):  This is the current hospital active medication list Current Facility-Administered Medications  Medication Dose Route Frequency Provider Last Rate Last Dose  . 0.9 %  sodium chloride infusion  250 mL Intravenous PRN Maryann Mikhail, DO      . aspirin tablet 325 mg  325 mg Oral Daily Maryann Mikhail, DO   325 mg at 11/07/16 0935  . cefTRIAXone (ROCEPHIN) 1 g in dextrose 5 % 50 mL  IVPB  1 g Intravenous Q24H Minda Ditto, RPH   1 g at 11/07/16 1257  . cholecalciferol (VITAMIN D) tablet 2,000 Units  2,000 Units Oral Daily Maryann Mikhail, DO   2,000 Units at 11/07/16 0936  . cyclobenzaprine (FLEXERIL) tablet 5 mg  5 mg Oral BID PRN Maryann Mikhail, DO   5 mg at 11/06/16 0409  . enoxaparin (LOVENOX) injection 40 mg  40 mg Subcutaneous Q24H Maryann Mikhail, DO   40 mg at 11/06/16 2215  . famotidine (PEPCID)  tablet 20 mg  20 mg Oral QHS Maryann Mikhail, DO   20 mg at 11/06/16 2215  . fluticasone (FLONASE) 50 MCG/ACT nasal spray 2 spray  2 spray Each Nare BID PRN Maryann Mikhail, DO      . furosemide (LASIX) tablet 40 mg  40 mg Oral q morning - 10a Maryann Mikhail, DO   40 mg at 11/07/16 0935  . guaiFENesin (MUCINEX) 12 hr tablet 600 mg  600 mg Oral BID PRN Maryann Mikhail, DO   600 mg at 11/06/16 2214  . haloperidol (HALDOL) tablet 1 mg  1 mg Oral Q4H PRN Maryann Mikhail, DO      . LORazepam (ATIVAN) tablet 0.25 mg  0.25 mg Oral Q4H PRN Maryann Mikhail, DO   0.25 mg at 11/06/16 2214  . magnesium oxide (MAG-OX) tablet 400 mg  400 mg Oral QHS Maryann Mikhail, DO   400 mg at 11/06/16 2215  . morphine CONCENTRATE 10 MG/0.5ML oral solution 10 mg  10 mg Sublingual Q4H PRN Maryann Mikhail, DO   10 mg at 11/07/16 0825  . ondansetron (ZOFRAN) tablet 4 mg  4 mg Oral Q6H PRN Maryann Mikhail, DO       Or  . ondansetron (ZOFRAN) injection 4 mg  4 mg Intravenous Q6H PRN Maryann Mikhail, DO      . pantoprazole (PROTONIX) EC tablet 40 mg  40 mg Oral Daily Maryann Mikhail, DO   40 mg at 11/07/16 0935  . polyethylene glycol (MIRALAX / GLYCOLAX) packet 17 g  17 g Oral Daily PRN Maryann Mikhail, DO      . potassium chloride SA (K-DUR,KLOR-CON) CR tablet 40 mEq  40 mEq Oral Daily Maryann Mikhail, DO   40 mEq at 11/07/16 0936  . predniSONE (DELTASONE) tablet 10 mg  10 mg Oral Q breakfast Maryann Mikhail, DO   10 mg at 11/07/16 [id number]  . predniSONE (DELTASONE) tablet 20 mg  20 mg Oral Q breakfast Maryann Mikhail, DO   20 mg at 11/07/16 [id number]  . sertraline (ZOLOFT) tablet 100 mg  100 mg Oral Daily Maryann Mikhail, DO   100 mg at 11/07/16 0936  . sodium chloride (OCEAN) 0.65 % nasal spray 1 spray  1 spray Each Nare Daily PRN Maryann Mikhail, DO      . sodium chloride flush (NS) 0.9 % injection 3 mL  3 mL Intravenous Q12H Maryann Mikhail, DO   3 mL at 11/07/16 0936  . sodium chloride flush (NS) 0.9 % injection 3 mL  3 mL  Intravenous PRN Cristal Ford, DO         Discharge Medications: Please see discharge summary for a list of discharge medications.  Relevant Imaging Results:  Relevant Lab Results:   Additional Information SSN: [id number]  Daegan Arizmendi, Randall An, LCSW

## 2016-11-08 LAB — BASIC METABOLIC PANEL
Anion gap: 6 (ref 5–15)
BUN: 24 mg/dL — ABNORMAL HIGH (ref 6–20)
CALCIUM: 9.5 mg/dL (ref 8.9–10.3)
CO2: 34 mmol/L — ABNORMAL HIGH (ref 22–32)
Chloride: 99 mmol/L — ABNORMAL LOW (ref 101–111)
Creatinine, Ser: 0.63 mg/dL (ref 0.44–1.00)
GFR calc non Af Amer: >60 mL/min (ref >60)
GLUCOSE: 105 mg/dL — AB (ref 65–99)
POTASSIUM: 4.2 mmol/L (ref 3.5–5.1)
SODIUM: 139 mmol/L (ref 135–145)

## 2016-11-08 MED ORDER — AMLODIPINE BESYLATE 10 MG PO TABS: 10.0000 mg | ORAL_TABLET | Freq: Every day | ORAL | Status: DC

## 2016-11-08 MED ORDER — METOPROLOL TARTRATE 25 MG PO TABS: 25.0000 mg | ORAL_TABLET | Freq: Two times a day (BID) | ORAL | Status: DC

## 2016-11-08 MED ORDER — BISACODYL 10 MG RE SUPP
10.0000 mg | Freq: Once | RECTAL | Status: AC
  Administered 2016-11-08: 10 mg via RECTAL
  Filled 2016-11-08: qty 1

## 2016-11-08 MED ORDER — AMLODIPINE BESYLATE 10 MG PO TABS: 10.0000 mg | ORAL_TABLET | Freq: Every day | ORAL | 2 refills | Status: AC

## 2016-11-08 NOTE — Clinical Social Work Note (Signed)
MSW informed by LTC SW, Dorene Sorrow at [facility] at [facility] that room will not be available until Thursday, 1/4. MSW notified patient's brother, Clair Gulling who is already aware and reported that he plans for patient to return home in the event [facility] cannot admit patient.  During a later conversation, pt's brother reported that since [facility] does not have a room available today that he would like for her to remain hospitalized until bed is available on Thursday, 1/4. MSW explained that if patient is medically stable for dc that patient would have to transition to next level of care immediately. MSW also shared that patient could return home tonight with pvt caregivers and transition into [place] on Thursday, 1/4.   RNCM notified. MSW remains available as needed.   Glendon Axe, MSW [phone] 11/08/2016 12:42 PM

## 2016-11-08 NOTE — Clinical Social Work Note (Addendum)
MSW reviewed chart and noted that patient has been discharged home with brother, Clair Gulling.   Updated discharge summary faxed via [facility] to [place] at [facility] in the event patient will be admitted on Thursday, 1/4.   Medical Social Worker will sign off for now as social work intervention is no longer needed. Please consult [place] again if new need arises.  Glendon Axe, MSW [phone] 11/08/2016 2:55 PM

## 2016-11-08 NOTE — Discharge Summary (Addendum)
Physician Discharge Summary  [name] [id number] DOB: [birth year] DOA: 11/05/2016  PCP: Binnie Rail, MD  Admit date: 11/05/2016 Discharge date: 11/08/2016  Time spent: 25* minutes  Recommendations for Outpatient Follow-up:  1. Follow up PCP in 2 weeks   Discharge Diagnoses:  Active Problems:   Essential hypertension   GERD   Leukocytosis   Chronic diastolic CHF (congestive heart failure) (HCC)   Weakness generalized   Physical deconditioning   UTI (urinary tract infection)   Generalized weakness   Discharge Condition: Stable  Diet recommendation: Low salt diet  Filed Weights   11/05/16 2115 11/07/16 0544  Weight: 67 kg (147 lb 12.8 oz) 64.1 kg (141 lb 4.8 oz)    History of present illness:  83 y.o.femalewith a medical history of pulmonary fibrosis on 4 and half liters of O2 at home, GERD, hypertension, chronic diastolic heart failure, who presented to the emergency department with complaints of weakness and painful urination. Patient states she had a urinary tract infection approximate 2 weeks ago was treated with ampicillin as an outpatient. She currently has been having burning with urination with some occasional incontinence however the incontinence is not new. She feels she takes too much Lasix. She does live at home alone and has been feeling increasingly weak over the last several days. She attempted to call hospice however did not get a lot of person on the phone. Patient thought she may fall, she came to the emergency department. Currently she denies any chest pain, abnormalities with her breathing, dizziness or headache, nausea or vomiting, diarrhea or constipation. She denies any recent ill or sick contacts or travel  Hospital Course:   Generalized weakness Patient was admitted with generalized weakness, initially thought to the UTI. She was empirically started on ceftriaxone, uterine culture only grew contamination so antibiotics were discontinued. Patient  recently treated for UTI 2 weeks ago with ampicillin. TSH 1.392. Blood culture negative to date. She is afebrile.   Patient is at home hospice and plan to go home with hospice.   Leukocytosis  -WBC reduced to 10.8  -Likely from prednisone   Essential hypertension -Continue lasix - will add Amlodipine  GERD -Continue PPI and H2 blocker  Chronic diastolic heart failure -Currently stable, euvolemic -Last echocardiogram 9/26/16showed an EF of [id number], grade 1 diastolic dysfunction -continue Lasix   Pulmonary fibrosis -On home oxygen, 4 L. - On 2 l /min oxygen in the hospital -Followed by hospice -Continue home medications including ;prednisone, morphine, Ativan, Haldol   Procedures:  None  Consultations: None Discharge Exam: Vitals:   11/07/16 2216 11/08/16 0506  BP: (!) 166/57 (!) 184/58  Pulse: 90 72  Resp: 16 14  Temp: (!) 96.7 F (35.9 C) 97.8 F (36.6 C)    General: Appears in no acute distress Cardiovascular: RRR, S1S2 Respiratory: Clear bilaterally  Discharge Instructions   Discharge Instructions    Diet - low sodium heart healthy    Complete by:  As directed    Increase activity slowly    Complete by:  As directed      Current Discharge Medication List    START taking these medications   Details  amLODipine (NORVASC) 10 MG tablet Take 1 tablet (10 mg total) by mouth daily. Qty: 30 tablet, Refills: 2      CONTINUE these medications which have NOT CHANGED   Details  aspirin 325 MG tablet TK 1 T PO QD Refills: 3    Cholecalciferol (VITAMIN D) 2000 units CAPS Take  2,000 Units by mouth daily.    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     furosemide (LASIX) 40 MG tablet Take 1 tablet (40 mg total) by mouth every  morning. Qty: 30 tablet, Refills: 2    haloperidol (HALDOL) 1 MG tablet Take 1 mg by mouth every 4 (four) hours as needed for nausea or agitation. Refills: 3    LORazepam (ATIVAN) 0.5 MG tablet TK 1/2 T PO Q 4 H PRN FOR ANXIETY OR SHORTNESS OF BREATH Refills: 3    Magnesium Oxide -Mg Supplement 250 MG TABS Take 1 tablet (250 mg total) by mouth at bedtime. --- Must have office visit for further refills. Qty: 15 tablet, Refills: 0    MELATONIN PO Take 1 tablet by mouth at bedtime.    Morphine Sulfate (MORPHINE CONCENTRATE) 10 mg / 0.5 ml concentrated solution TK 0.20 ML  PO Q 4 H PRN PAIN Refills: 0    MUCUS RELIEF ER 600 MG 12 hr tablet Take 1 tablet by mouth in the morning and then take 1 at night Refills: 3    NEXIUM 24HR 20 MG capsule TAKE 1 CAPSULE BY MOUTH TWICE DAILY Qty: 60 capsule, Refills: 0    polyethylene glycol (MIRALAX / GLYCOLAX) packet Take 17 g by mouth daily as needed. Qty: 14 each, Refills: 0    potassium chloride SA (K-DUR,KLOR-CON) 20 MEQ tablet Take 1 tablet (20 mEq total) by mouth daily. Qty: 30 tablet, Refills: 5    !! predniSONE (DELTASONE) 10 MG tablet TAKE 1 TABLET BY MOUTH EVERY DAY ALONG WITH 20 MG PREDNISONE TABLETS TO EQUAL 30MG  Qty: 30 tablet, Refills: 0    !! predniSONE (DELTASONE) 20 MG tablet Take 20 mg by mouth daily with breakfast. Take along with 10 mg tablet=30 mg daily Refills: 3    sertraline (ZOLOFT) 100 MG tablet Take 1 tablet (100 mg total) by mouth daily. -- must have office visit for further refills Qty: 30 tablet, Refills: 0    sodium chloride (OCEAN) 0.65 % SOLN nasal spray Place 1 spray into both nostrils 2 (two) times daily. Refills: 0    cyclobenzaprine (FLEXERIL) 5 MG tablet Take 1 tablet (5 mg total) by mouth 2 (two) times daily as needed for muscle spasms. Qty: 6 tablet, Refills: 0    OXYGEN Oxygen 3lpm 24/7 DME- [facility]     !! - Potential duplicate medications found. Please discuss with provider.      Allergies  Allergen Reactions  . Anesthetics, Amide Nausea And Vomiting  . Atorvastatin Other (See Comments)    Myalgia   . Codeine Nausea And Vomiting  . Darvocet [Propoxyphene N-Acetaminophen] Nausea And Vomiting  . Valsartan Other (See Comments)    Per patient " did not feel well, cannot describe it"  . Antihistamines, Loratadine-Type Other (See Comments)    Weird dreams  . Moxifloxacin Anxiety and Other (See Comments)    Nightmares      The results of significant diagnostics from this hospitalization (including imaging, microbiology, ancillary and laboratory) are listed below for reference.    Significant Diagnostic Studies: Dg Chest 2 View  Result Date: 11/05/2016 CLINICAL DATA:  Productive cough. EXAM: CHEST  2 VIEW COMPARISON:  Radiographs of July 28, 2016. FINDINGS: The heart size and mediastinal contours are within  normal limits. No pneumothorax or pleural effusion is noted. Mild dextroscoliosis of upper thoracic spine is noted. Atherosclerosis of thoracic aorta is noted. Stable diffuse interstitial densities are noted throughout both lungs consistent with scarring. IMPRESSION: Stable diffuse interstitial densities noted throughout both lungs consistent with scarring or chronic interstitial lung disease. No significant change compared to prior exam. Aortic atherosclerosis. Electronically Signed   By: Marijo Conception, M.D.   On: 11/05/2016 10:37    Microbiology: Recent Results (from the past 240 hour(s))  Urine culture     Status: Abnormal   Collection Time: 11/05/16 11:16 AM  Result Value Ref Range Status   Specimen Description URINE, CLEAN CATCH  Final   Special Requests NONE  Final   Culture MULTIPLE SPECIES PRESENT, SUGGEST RECOLLECTION (A)  Final   Report Status 11/06/2016 FINAL  Final  Culture, blood (routine x 2)     Status: None (Preliminary result)   Collection Time: 11/05/16  2:44 PM  Result Value Ref Range Status   Specimen Description BLOOD LEFT  ANTECUBITAL  Final   Special Requests IN PEDIATRIC BOTTLE 3CC  Final   Culture   Final    NO GROWTH 2 DAYS Performed at [facility]    Report Status PENDING  Incomplete  Culture, blood (routine x 2)     Status: None (Preliminary result)   Collection Time: 11/05/16  2:44 PM  Result Value Ref Range Status   Specimen Description BLOOD LEFT HAND  Final   Special Requests IN PEDIATRIC BOTTLE 3 CC  Final   Culture   Final    NO GROWTH 2 DAYS Performed at [facility]    Report Status PENDING  Incomplete     Labs: Basic Metabolic Panel:  Recent Labs Lab 11/05/16 1039 11/06/16 0419 11/08/16 0508  NA 135 136 139  K 4.8 4.4 4.2  CL 98* 98* 99*  CO2 32 31 34*  GLUCOSE 100* 85 105*  BUN 16 14 24*  CREATININE 0.56 0.48 0.63  CALCIUM 10.0 9.7 9.5   Liver Function Tests:  Recent Labs Lab 11/05/16 1039  AST 21  ALT 16  ALKPHOS 54  BILITOT 0.8  PROT 6.5  ALBUMIN 3.2*   No results for input(s): LIPASE, AMYLASE in the last 168 hours. No results for input(s): AMMONIA in the last 168 hours. CBC:  Recent Labs Lab 11/05/16 1039 11/06/16 0419  WBC 12.4* 10.8*  NEUTROABS 10.1*  --   HGB 12.1 11.5*  HCT 38.8 37.0  MCV 88.4 89.8  PLT PLATELET CLUMPS NOTED ON SMEAR, COUNT APPEARS ADEQUATE PLATELET CLUMPS NOTED ON SMEAR, UNABLE TO ESTIMATE    ProBNP (last 3 results)  Recent Labs  01/06/16 1422  PROBNP 362.0*     Signed:  Eleonore Chiquito S MD.  [facility] 11/08/2016, 12:34 PM

## 2016-11-08 NOTE — Consult Note (Signed)
[facility]--Visited pt at bedside.  Her brother, Herby Abraham was present for my visit and while talking, he received a call from Dorene Sorrow with admissions at [place] [facility] [place].  Peggy informed [facility] that they have a non-skilled, long-term-care bed available for pt but it will not be ready until tomorrow around lunchtime.  Per [facility] Gull[facility], pt is planning to accept this bed offer and pay privately for long-term care and continue Hospice support while at [facility].  Will continue to follow pt at time of d/c to provide comfort care.  Thank you.  Wynetta Fines, RN [phone]

## 2016-11-08 NOTE — Clinical Social Work Note (Signed)
MSW contacted LTC SW, Dorene Sorrow at [facility] [place] in reference to post-acute placement. Facility does have an opening for LTC. Admissions coordinator to contact patient's brother in regards to admitting patient once medically stable.   MSW remains available as needed.   Glendon Axe, MSW [phone] 11/08/2016 10:41 AM

## 2016-11-08 NOTE — Progress Notes (Signed)
Pt was given discharge instructions and all questions were answered. Pt was taken to main entrance via wheelchair. [place]

## 2016-11-09 ENCOUNTER — Telehealth: Payer: Self-pay | Admitting: *Deleted

## 2016-11-09 NOTE — Telephone Encounter (Signed)
Tried calling pt to set-up TCM appt no answer x's 10 rings. Will try again later...Johny Chess

## 2016-11-10 DIAGNOSIS — I10 Essential (primary) hypertension: Secondary | ICD-10-CM | POA: Diagnosis not present

## 2016-11-10 DIAGNOSIS — J8410 Pulmonary fibrosis, unspecified: Secondary | ICD-10-CM | POA: Diagnosis not present

## 2016-11-10 DIAGNOSIS — I2510 Atherosclerotic heart disease of native coronary artery without angina pectoris: Secondary | ICD-10-CM | POA: Diagnosis not present

## 2016-11-10 DIAGNOSIS — J9610 Chronic respiratory failure, unspecified whether with hypoxia or hypercapnia: Secondary | ICD-10-CM | POA: Diagnosis not present

## 2016-11-10 DIAGNOSIS — I509 Heart failure, unspecified: Secondary | ICD-10-CM | POA: Diagnosis not present

## 2016-11-10 LAB — CULTURE, BLOOD (ROUTINE X 2)
CULTURE: NO GROWTH
CULTURE: NO GROWTH

## 2016-11-10 NOTE — Telephone Encounter (Signed)
Tried calling pt again line keep being busy...[name]

## 2016-11-13 NOTE — Telephone Encounter (Signed)
Tried calling pt again still no answer x's 10 rings. Called brother # on file no answer LMOM pt need to make hosp f/u appt pls contact office. Closing encounter...Johny Chess

## 2016-11-14 DIAGNOSIS — F419 Anxiety disorder, unspecified: Secondary | ICD-10-CM | POA: Diagnosis not present

## 2016-11-14 DIAGNOSIS — F329 Major depressive disorder, single episode, unspecified: Secondary | ICD-10-CM | POA: Diagnosis not present

## 2016-11-22 IMAGING — DX DG CHEST 2V
2 series · 2 of 2 positions shown · non-contrast
Comparison: 10/02/2015

CLINICAL DATA: F/u on pulmonary fibrosis, weakness, sob, some
congestion, Hx of CAD, CHF, HTN.

EXAM:
CHEST  2 VIEW

[chest pa]
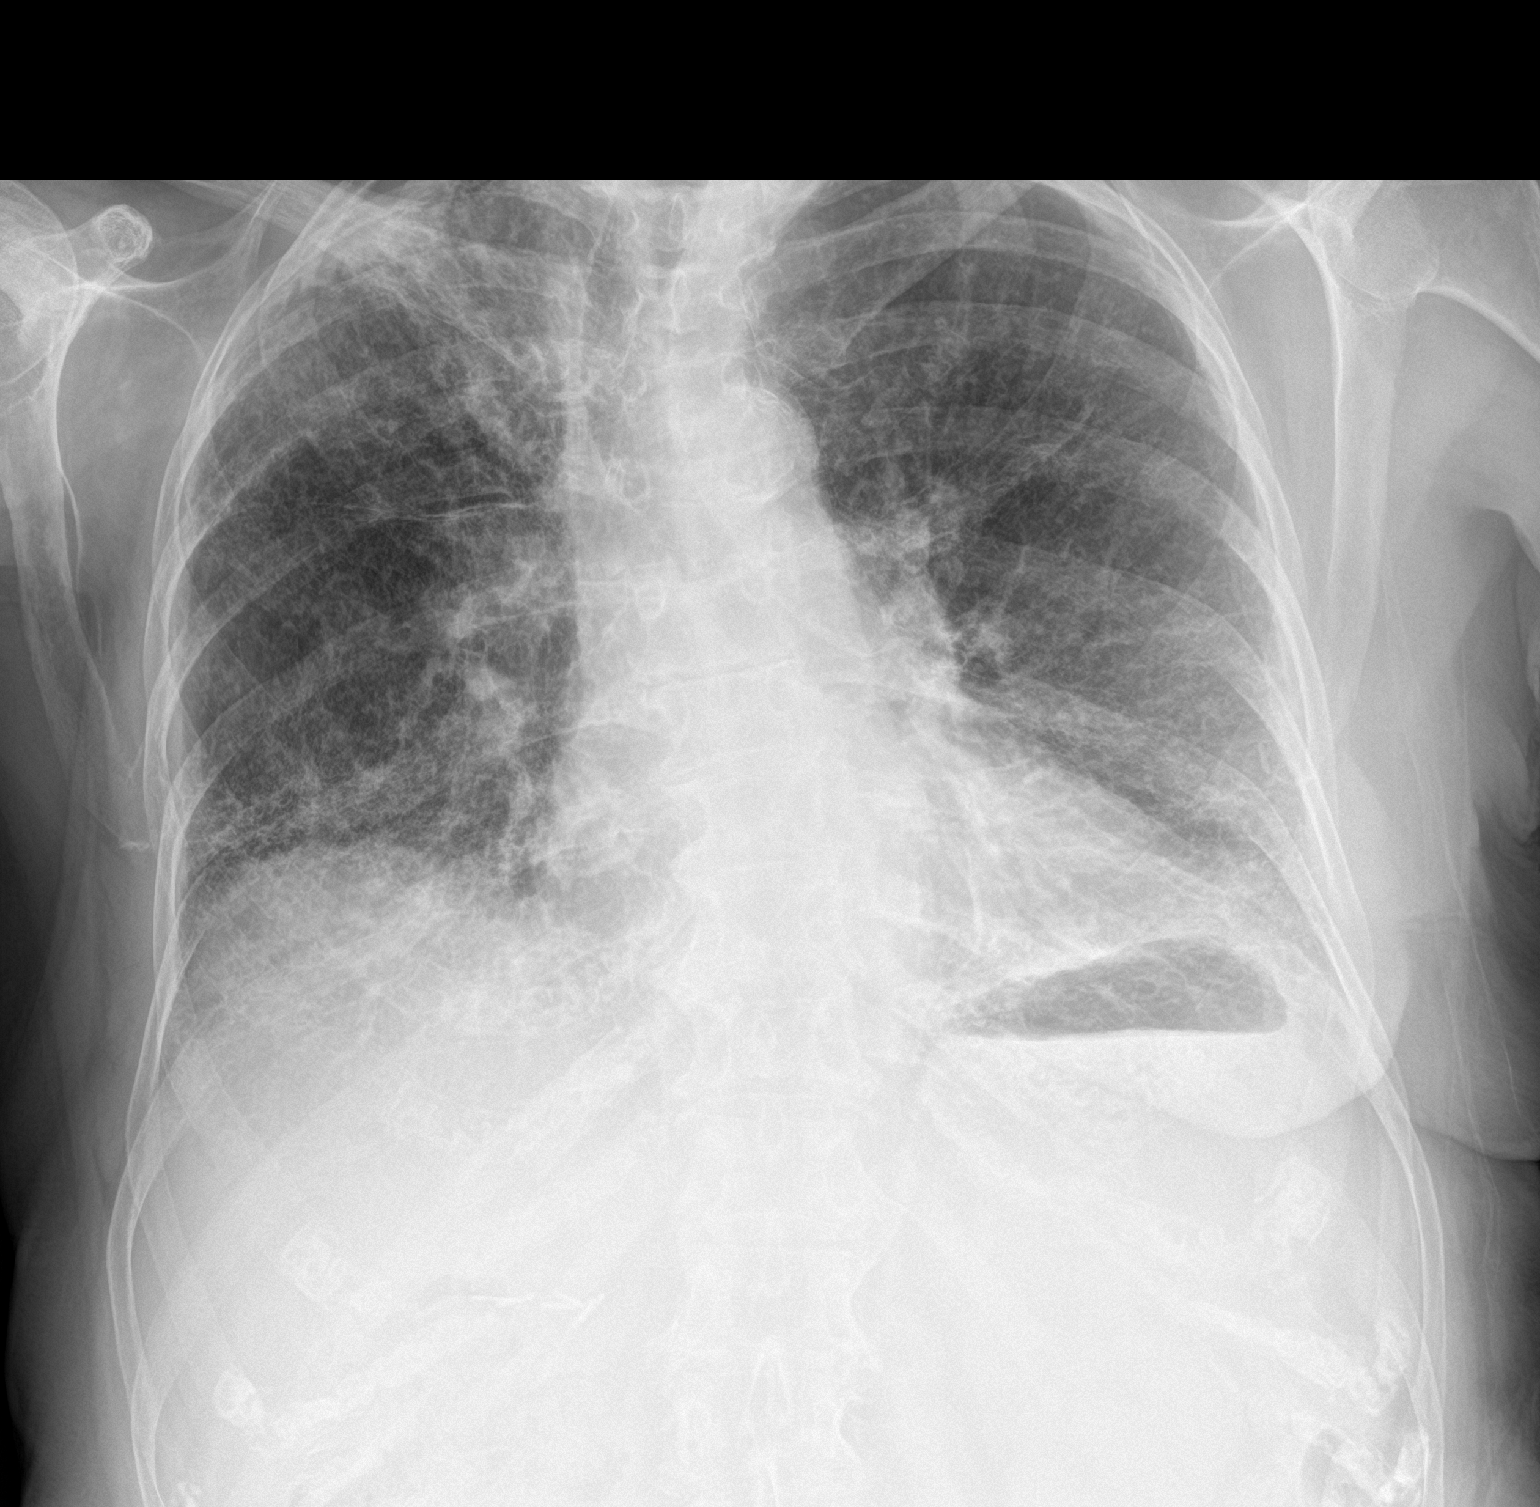

[chest lat]
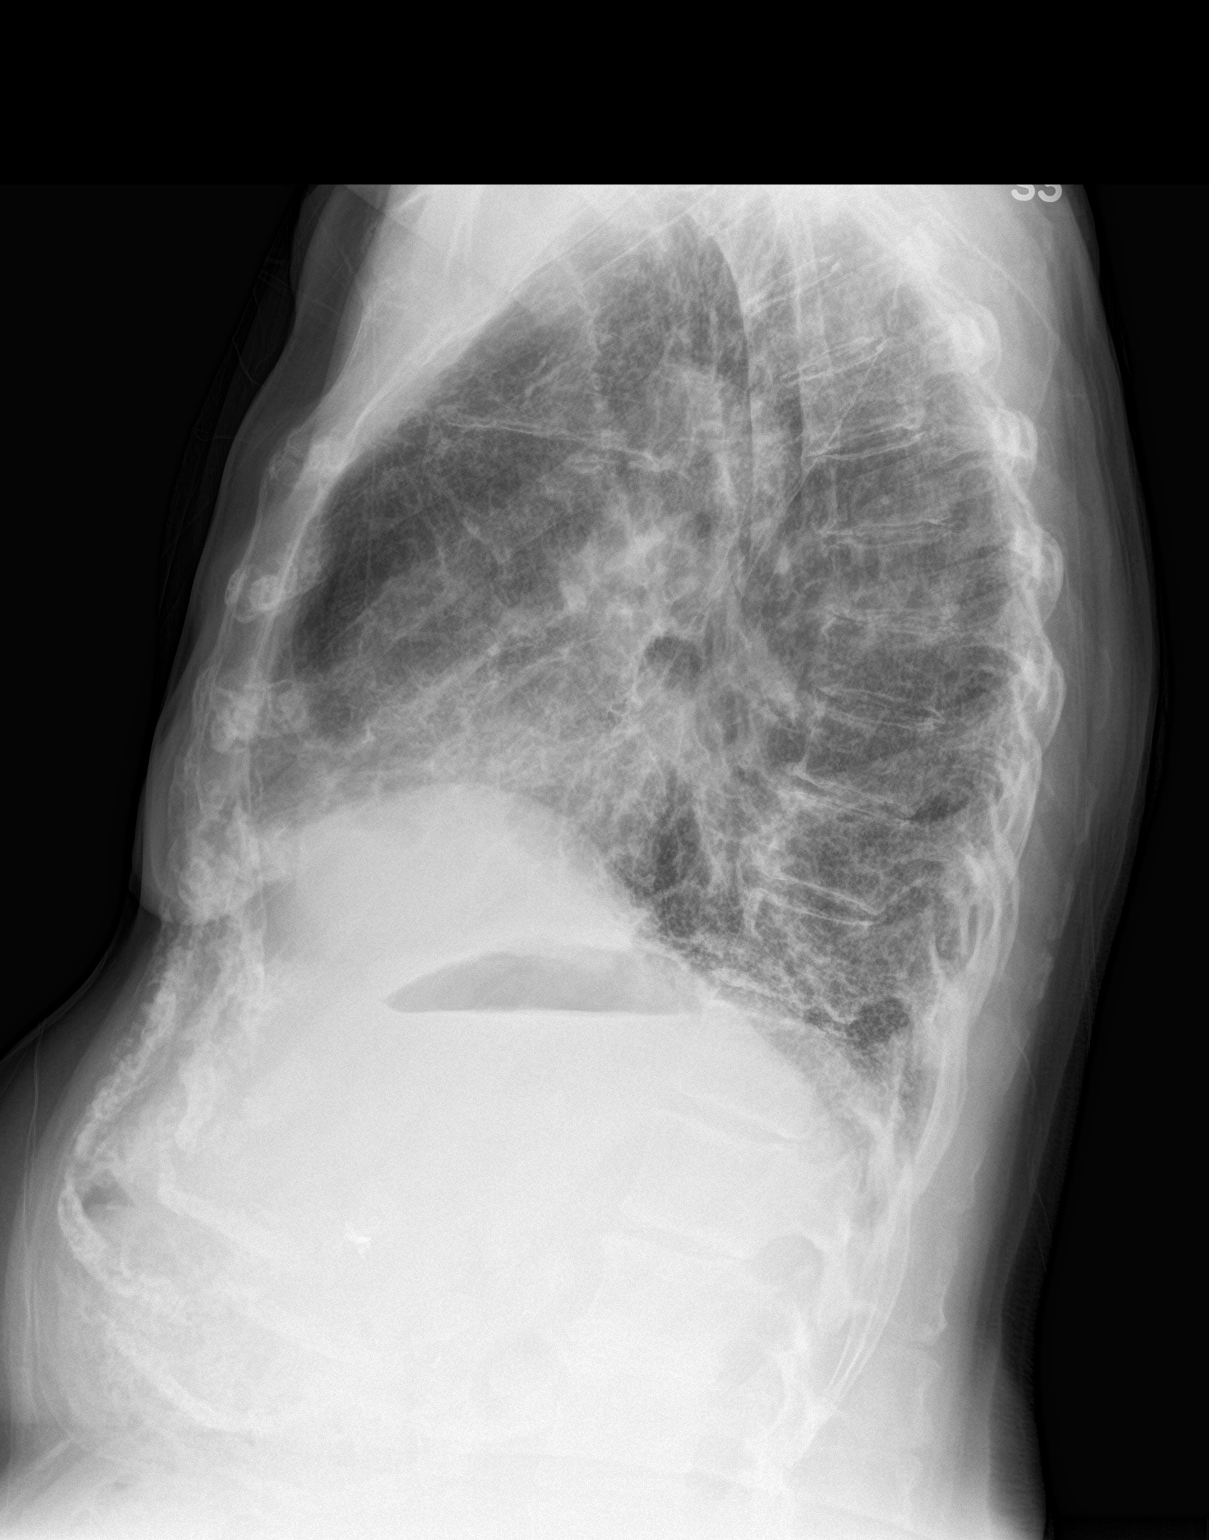

[2 of 2 positions shown; findings below may reference images not displayed]

FINDINGS: Heart size is normal. There are diffuse opacities throughout the
lungs consistent with pulmonary fibrosis. Opacity has decreased
since the prior study and today indicates some improvement in edema
since the previous study. No new infiltrates are identified. Stable
appearance of right apical pleural parenchymal thickening. There is
moderate mid thoracic spondylosis. Surgical clips are noted in the
right upper quadrant the abdomen.
IMPRESSION: 1. Chronic changes of pulmonary fibrosis.
2.  No evidence for acute  abnormality.

## 2016-12-07 DIAGNOSIS — J8410 Pulmonary fibrosis, unspecified: Secondary | ICD-10-CM | POA: Diagnosis not present

## 2016-12-07 DIAGNOSIS — I2510 Atherosclerotic heart disease of native coronary artery without angina pectoris: Secondary | ICD-10-CM | POA: Diagnosis not present

## 2016-12-07 DIAGNOSIS — K219 Gastro-esophageal reflux disease without esophagitis: Secondary | ICD-10-CM | POA: Diagnosis not present

## 2016-12-07 DIAGNOSIS — I5032 Chronic diastolic (congestive) heart failure: Secondary | ICD-10-CM | POA: Diagnosis not present

## 2016-12-07 DIAGNOSIS — Z9981 Dependence on supplemental oxygen: Secondary | ICD-10-CM | POA: Diagnosis not present

## 2016-12-07 DIAGNOSIS — Z853 Personal history of malignant neoplasm of breast: Secondary | ICD-10-CM | POA: Diagnosis not present

## 2016-12-07 DIAGNOSIS — I110 Hypertensive heart disease with heart failure: Secondary | ICD-10-CM | POA: Diagnosis not present

## 2016-12-08 DIAGNOSIS — I5032 Chronic diastolic (congestive) heart failure: Secondary | ICD-10-CM | POA: Diagnosis not present

## 2016-12-08 DIAGNOSIS — I110 Hypertensive heart disease with heart failure: Secondary | ICD-10-CM | POA: Diagnosis not present

## 2016-12-08 DIAGNOSIS — Z853 Personal history of malignant neoplasm of breast: Secondary | ICD-10-CM | POA: Diagnosis not present

## 2016-12-08 DIAGNOSIS — I2510 Atherosclerotic heart disease of native coronary artery without angina pectoris: Secondary | ICD-10-CM | POA: Diagnosis not present

## 2016-12-08 DIAGNOSIS — J8410 Pulmonary fibrosis, unspecified: Secondary | ICD-10-CM | POA: Diagnosis not present

## 2016-12-08 DIAGNOSIS — Z9981 Dependence on supplemental oxygen: Secondary | ICD-10-CM | POA: Diagnosis not present

## 2016-12-11 DIAGNOSIS — I2510 Atherosclerotic heart disease of native coronary artery without angina pectoris: Secondary | ICD-10-CM | POA: Diagnosis not present

## 2016-12-11 DIAGNOSIS — I110 Hypertensive heart disease with heart failure: Secondary | ICD-10-CM | POA: Diagnosis not present

## 2016-12-11 DIAGNOSIS — Z853 Personal history of malignant neoplasm of breast: Secondary | ICD-10-CM | POA: Diagnosis not present

## 2016-12-11 DIAGNOSIS — J8410 Pulmonary fibrosis, unspecified: Secondary | ICD-10-CM | POA: Diagnosis not present

## 2016-12-11 DIAGNOSIS — I5032 Chronic diastolic (congestive) heart failure: Secondary | ICD-10-CM | POA: Diagnosis not present

## 2016-12-11 DIAGNOSIS — Z9981 Dependence on supplemental oxygen: Secondary | ICD-10-CM | POA: Diagnosis not present

## 2016-12-13 DIAGNOSIS — Z853 Personal history of malignant neoplasm of breast: Secondary | ICD-10-CM | POA: Diagnosis not present

## 2016-12-13 DIAGNOSIS — I5032 Chronic diastolic (congestive) heart failure: Secondary | ICD-10-CM | POA: Diagnosis not present

## 2016-12-13 DIAGNOSIS — I2510 Atherosclerotic heart disease of native coronary artery without angina pectoris: Secondary | ICD-10-CM | POA: Diagnosis not present

## 2016-12-13 DIAGNOSIS — Z9981 Dependence on supplemental oxygen: Secondary | ICD-10-CM | POA: Diagnosis not present

## 2016-12-13 DIAGNOSIS — I110 Hypertensive heart disease with heart failure: Secondary | ICD-10-CM | POA: Diagnosis not present

## 2016-12-13 DIAGNOSIS — J8410 Pulmonary fibrosis, unspecified: Secondary | ICD-10-CM | POA: Diagnosis not present

## 2016-12-14 DIAGNOSIS — Z9981 Dependence on supplemental oxygen: Secondary | ICD-10-CM | POA: Diagnosis not present

## 2016-12-14 DIAGNOSIS — J8410 Pulmonary fibrosis, unspecified: Secondary | ICD-10-CM | POA: Diagnosis not present

## 2016-12-14 DIAGNOSIS — I2510 Atherosclerotic heart disease of native coronary artery without angina pectoris: Secondary | ICD-10-CM | POA: Diagnosis not present

## 2016-12-14 DIAGNOSIS — I110 Hypertensive heart disease with heart failure: Secondary | ICD-10-CM | POA: Diagnosis not present

## 2016-12-14 DIAGNOSIS — I5032 Chronic diastolic (congestive) heart failure: Secondary | ICD-10-CM | POA: Diagnosis not present

## 2016-12-14 DIAGNOSIS — Z853 Personal history of malignant neoplasm of breast: Secondary | ICD-10-CM | POA: Diagnosis not present

## 2016-12-15 DIAGNOSIS — Z853 Personal history of malignant neoplasm of breast: Secondary | ICD-10-CM | POA: Diagnosis not present

## 2016-12-15 DIAGNOSIS — Z9981 Dependence on supplemental oxygen: Secondary | ICD-10-CM | POA: Diagnosis not present

## 2016-12-15 DIAGNOSIS — J8410 Pulmonary fibrosis, unspecified: Secondary | ICD-10-CM | POA: Diagnosis not present

## 2016-12-15 DIAGNOSIS — I110 Hypertensive heart disease with heart failure: Secondary | ICD-10-CM | POA: Diagnosis not present

## 2016-12-15 DIAGNOSIS — I5032 Chronic diastolic (congestive) heart failure: Secondary | ICD-10-CM | POA: Diagnosis not present

## 2016-12-15 DIAGNOSIS — I2510 Atherosclerotic heart disease of native coronary artery without angina pectoris: Secondary | ICD-10-CM | POA: Diagnosis not present

## 2016-12-18 DIAGNOSIS — Z853 Personal history of malignant neoplasm of breast: Secondary | ICD-10-CM | POA: Diagnosis not present

## 2016-12-18 DIAGNOSIS — I5032 Chronic diastolic (congestive) heart failure: Secondary | ICD-10-CM | POA: Diagnosis not present

## 2016-12-18 DIAGNOSIS — I110 Hypertensive heart disease with heart failure: Secondary | ICD-10-CM | POA: Diagnosis not present

## 2016-12-18 DIAGNOSIS — I2510 Atherosclerotic heart disease of native coronary artery without angina pectoris: Secondary | ICD-10-CM | POA: Diagnosis not present

## 2016-12-18 DIAGNOSIS — J8410 Pulmonary fibrosis, unspecified: Secondary | ICD-10-CM | POA: Diagnosis not present

## 2016-12-18 DIAGNOSIS — Z9981 Dependence on supplemental oxygen: Secondary | ICD-10-CM | POA: Diagnosis not present

## 2016-12-19 DIAGNOSIS — I5032 Chronic diastolic (congestive) heart failure: Secondary | ICD-10-CM | POA: Diagnosis not present

## 2016-12-19 DIAGNOSIS — I2510 Atherosclerotic heart disease of native coronary artery without angina pectoris: Secondary | ICD-10-CM | POA: Diagnosis not present

## 2016-12-19 DIAGNOSIS — Z853 Personal history of malignant neoplasm of breast: Secondary | ICD-10-CM | POA: Diagnosis not present

## 2016-12-19 DIAGNOSIS — J8410 Pulmonary fibrosis, unspecified: Secondary | ICD-10-CM | POA: Diagnosis not present

## 2016-12-19 DIAGNOSIS — Z9981 Dependence on supplemental oxygen: Secondary | ICD-10-CM | POA: Diagnosis not present

## 2016-12-19 DIAGNOSIS — I110 Hypertensive heart disease with heart failure: Secondary | ICD-10-CM | POA: Diagnosis not present

## 2016-12-20 DIAGNOSIS — I110 Hypertensive heart disease with heart failure: Secondary | ICD-10-CM | POA: Diagnosis not present

## 2016-12-20 DIAGNOSIS — I5032 Chronic diastolic (congestive) heart failure: Secondary | ICD-10-CM | POA: Diagnosis not present

## 2016-12-20 DIAGNOSIS — I2510 Atherosclerotic heart disease of native coronary artery without angina pectoris: Secondary | ICD-10-CM | POA: Diagnosis not present

## 2016-12-20 DIAGNOSIS — Z853 Personal history of malignant neoplasm of breast: Secondary | ICD-10-CM | POA: Diagnosis not present

## 2016-12-20 DIAGNOSIS — J8410 Pulmonary fibrosis, unspecified: Secondary | ICD-10-CM | POA: Diagnosis not present

## 2016-12-20 DIAGNOSIS — Z9981 Dependence on supplemental oxygen: Secondary | ICD-10-CM | POA: Diagnosis not present

## 2016-12-21 DIAGNOSIS — I5032 Chronic diastolic (congestive) heart failure: Secondary | ICD-10-CM | POA: Diagnosis not present

## 2016-12-21 DIAGNOSIS — J8410 Pulmonary fibrosis, unspecified: Secondary | ICD-10-CM | POA: Diagnosis not present

## 2016-12-21 DIAGNOSIS — I2510 Atherosclerotic heart disease of native coronary artery without angina pectoris: Secondary | ICD-10-CM | POA: Diagnosis not present

## 2016-12-21 DIAGNOSIS — Z853 Personal history of malignant neoplasm of breast: Secondary | ICD-10-CM | POA: Diagnosis not present

## 2016-12-21 DIAGNOSIS — Z9981 Dependence on supplemental oxygen: Secondary | ICD-10-CM | POA: Diagnosis not present

## 2016-12-21 DIAGNOSIS — I110 Hypertensive heart disease with heart failure: Secondary | ICD-10-CM | POA: Diagnosis not present

## 2016-12-22 DIAGNOSIS — Z853 Personal history of malignant neoplasm of breast: Secondary | ICD-10-CM | POA: Diagnosis not present

## 2016-12-22 DIAGNOSIS — I2510 Atherosclerotic heart disease of native coronary artery without angina pectoris: Secondary | ICD-10-CM | POA: Diagnosis not present

## 2016-12-22 DIAGNOSIS — I110 Hypertensive heart disease with heart failure: Secondary | ICD-10-CM | POA: Diagnosis not present

## 2016-12-22 DIAGNOSIS — J8410 Pulmonary fibrosis, unspecified: Secondary | ICD-10-CM | POA: Diagnosis not present

## 2016-12-22 DIAGNOSIS — I5032 Chronic diastolic (congestive) heart failure: Secondary | ICD-10-CM | POA: Diagnosis not present

## 2016-12-22 DIAGNOSIS — Z9981 Dependence on supplemental oxygen: Secondary | ICD-10-CM | POA: Diagnosis not present

## 2016-12-25 DIAGNOSIS — I5032 Chronic diastolic (congestive) heart failure: Secondary | ICD-10-CM | POA: Diagnosis not present

## 2016-12-25 DIAGNOSIS — I110 Hypertensive heart disease with heart failure: Secondary | ICD-10-CM | POA: Diagnosis not present

## 2016-12-25 DIAGNOSIS — I2510 Atherosclerotic heart disease of native coronary artery without angina pectoris: Secondary | ICD-10-CM | POA: Diagnosis not present

## 2016-12-25 DIAGNOSIS — Z853 Personal history of malignant neoplasm of breast: Secondary | ICD-10-CM | POA: Diagnosis not present

## 2016-12-25 DIAGNOSIS — Z9981 Dependence on supplemental oxygen: Secondary | ICD-10-CM | POA: Diagnosis not present

## 2016-12-25 DIAGNOSIS — J8410 Pulmonary fibrosis, unspecified: Secondary | ICD-10-CM | POA: Diagnosis not present

## 2016-12-26 DIAGNOSIS — I110 Hypertensive heart disease with heart failure: Secondary | ICD-10-CM | POA: Diagnosis not present

## 2016-12-26 DIAGNOSIS — Z853 Personal history of malignant neoplasm of breast: Secondary | ICD-10-CM | POA: Diagnosis not present

## 2016-12-26 DIAGNOSIS — I5032 Chronic diastolic (congestive) heart failure: Secondary | ICD-10-CM | POA: Diagnosis not present

## 2016-12-26 DIAGNOSIS — J9610 Chronic respiratory failure, unspecified whether with hypoxia or hypercapnia: Secondary | ICD-10-CM | POA: Diagnosis not present

## 2016-12-26 DIAGNOSIS — J8410 Pulmonary fibrosis, unspecified: Secondary | ICD-10-CM | POA: Diagnosis not present

## 2016-12-26 DIAGNOSIS — K219 Gastro-esophageal reflux disease without esophagitis: Secondary | ICD-10-CM | POA: Diagnosis not present

## 2016-12-26 DIAGNOSIS — Z9981 Dependence on supplemental oxygen: Secondary | ICD-10-CM | POA: Diagnosis not present

## 2016-12-26 DIAGNOSIS — I2510 Atherosclerotic heart disease of native coronary artery without angina pectoris: Secondary | ICD-10-CM | POA: Diagnosis not present

## 2016-12-27 DIAGNOSIS — I110 Hypertensive heart disease with heart failure: Secondary | ICD-10-CM | POA: Diagnosis not present

## 2016-12-27 DIAGNOSIS — I5032 Chronic diastolic (congestive) heart failure: Secondary | ICD-10-CM | POA: Diagnosis not present

## 2016-12-27 DIAGNOSIS — I2510 Atherosclerotic heart disease of native coronary artery without angina pectoris: Secondary | ICD-10-CM | POA: Diagnosis not present

## 2016-12-27 DIAGNOSIS — Z853 Personal history of malignant neoplasm of breast: Secondary | ICD-10-CM | POA: Diagnosis not present

## 2016-12-27 DIAGNOSIS — Z9981 Dependence on supplemental oxygen: Secondary | ICD-10-CM | POA: Diagnosis not present

## 2016-12-27 DIAGNOSIS — J8410 Pulmonary fibrosis, unspecified: Secondary | ICD-10-CM | POA: Diagnosis not present

## 2016-12-28 DIAGNOSIS — I5032 Chronic diastolic (congestive) heart failure: Secondary | ICD-10-CM | POA: Diagnosis not present

## 2016-12-28 DIAGNOSIS — I2510 Atherosclerotic heart disease of native coronary artery without angina pectoris: Secondary | ICD-10-CM | POA: Diagnosis not present

## 2016-12-28 DIAGNOSIS — J8410 Pulmonary fibrosis, unspecified: Secondary | ICD-10-CM | POA: Diagnosis not present

## 2016-12-28 DIAGNOSIS — I110 Hypertensive heart disease with heart failure: Secondary | ICD-10-CM | POA: Diagnosis not present

## 2016-12-28 DIAGNOSIS — Z9981 Dependence on supplemental oxygen: Secondary | ICD-10-CM | POA: Diagnosis not present

## 2016-12-28 DIAGNOSIS — Z853 Personal history of malignant neoplasm of breast: Secondary | ICD-10-CM | POA: Diagnosis not present

## 2016-12-29 DIAGNOSIS — J8410 Pulmonary fibrosis, unspecified: Secondary | ICD-10-CM | POA: Diagnosis not present

## 2016-12-29 DIAGNOSIS — I5032 Chronic diastolic (congestive) heart failure: Secondary | ICD-10-CM | POA: Diagnosis not present

## 2016-12-29 DIAGNOSIS — I2510 Atherosclerotic heart disease of native coronary artery without angina pectoris: Secondary | ICD-10-CM | POA: Diagnosis not present

## 2016-12-29 DIAGNOSIS — Z853 Personal history of malignant neoplasm of breast: Secondary | ICD-10-CM | POA: Diagnosis not present

## 2016-12-29 DIAGNOSIS — I110 Hypertensive heart disease with heart failure: Secondary | ICD-10-CM | POA: Diagnosis not present

## 2016-12-29 DIAGNOSIS — Z9981 Dependence on supplemental oxygen: Secondary | ICD-10-CM | POA: Diagnosis not present

## 2017-01-01 DIAGNOSIS — I5032 Chronic diastolic (congestive) heart failure: Secondary | ICD-10-CM | POA: Diagnosis not present

## 2017-01-01 DIAGNOSIS — Z9981 Dependence on supplemental oxygen: Secondary | ICD-10-CM | POA: Diagnosis not present

## 2017-01-01 DIAGNOSIS — Z853 Personal history of malignant neoplasm of breast: Secondary | ICD-10-CM | POA: Diagnosis not present

## 2017-01-01 DIAGNOSIS — I2510 Atherosclerotic heart disease of native coronary artery without angina pectoris: Secondary | ICD-10-CM | POA: Diagnosis not present

## 2017-01-01 DIAGNOSIS — I110 Hypertensive heart disease with heart failure: Secondary | ICD-10-CM | POA: Diagnosis not present

## 2017-01-01 DIAGNOSIS — J8410 Pulmonary fibrosis, unspecified: Secondary | ICD-10-CM | POA: Diagnosis not present

## 2017-01-02 DIAGNOSIS — Z9981 Dependence on supplemental oxygen: Secondary | ICD-10-CM | POA: Diagnosis not present

## 2017-01-02 DIAGNOSIS — J8410 Pulmonary fibrosis, unspecified: Secondary | ICD-10-CM | POA: Diagnosis not present

## 2017-01-02 DIAGNOSIS — I5032 Chronic diastolic (congestive) heart failure: Secondary | ICD-10-CM | POA: Diagnosis not present

## 2017-01-02 DIAGNOSIS — I2510 Atherosclerotic heart disease of native coronary artery without angina pectoris: Secondary | ICD-10-CM | POA: Diagnosis not present

## 2017-01-02 DIAGNOSIS — I110 Hypertensive heart disease with heart failure: Secondary | ICD-10-CM | POA: Diagnosis not present

## 2017-01-02 DIAGNOSIS — Z853 Personal history of malignant neoplasm of breast: Secondary | ICD-10-CM | POA: Diagnosis not present

## 2017-01-03 DIAGNOSIS — Z9981 Dependence on supplemental oxygen: Secondary | ICD-10-CM | POA: Diagnosis not present

## 2017-01-03 DIAGNOSIS — Z853 Personal history of malignant neoplasm of breast: Secondary | ICD-10-CM | POA: Diagnosis not present

## 2017-01-03 DIAGNOSIS — I110 Hypertensive heart disease with heart failure: Secondary | ICD-10-CM | POA: Diagnosis not present

## 2017-01-03 DIAGNOSIS — I5032 Chronic diastolic (congestive) heart failure: Secondary | ICD-10-CM | POA: Diagnosis not present

## 2017-01-03 DIAGNOSIS — J8410 Pulmonary fibrosis, unspecified: Secondary | ICD-10-CM | POA: Diagnosis not present

## 2017-01-03 DIAGNOSIS — I2510 Atherosclerotic heart disease of native coronary artery without angina pectoris: Secondary | ICD-10-CM | POA: Diagnosis not present

## 2017-01-04 DIAGNOSIS — Z87440 Personal history of urinary (tract) infections: Secondary | ICD-10-CM | POA: Diagnosis not present

## 2017-01-04 DIAGNOSIS — K219 Gastro-esophageal reflux disease without esophagitis: Secondary | ICD-10-CM | POA: Diagnosis not present

## 2017-01-04 DIAGNOSIS — Z9981 Dependence on supplemental oxygen: Secondary | ICD-10-CM | POA: Diagnosis not present

## 2017-01-04 DIAGNOSIS — I2510 Atherosclerotic heart disease of native coronary artery without angina pectoris: Secondary | ICD-10-CM | POA: Diagnosis not present

## 2017-01-04 DIAGNOSIS — I110 Hypertensive heart disease with heart failure: Secondary | ICD-10-CM | POA: Diagnosis not present

## 2017-01-04 DIAGNOSIS — I5032 Chronic diastolic (congestive) heart failure: Secondary | ICD-10-CM | POA: Diagnosis not present

## 2017-01-04 DIAGNOSIS — Z853 Personal history of malignant neoplasm of breast: Secondary | ICD-10-CM | POA: Diagnosis not present

## 2017-01-04 DIAGNOSIS — J8410 Pulmonary fibrosis, unspecified: Secondary | ICD-10-CM | POA: Diagnosis not present

## 2017-01-05 DIAGNOSIS — I110 Hypertensive heart disease with heart failure: Secondary | ICD-10-CM | POA: Diagnosis not present

## 2017-01-05 DIAGNOSIS — I2510 Atherosclerotic heart disease of native coronary artery without angina pectoris: Secondary | ICD-10-CM | POA: Diagnosis not present

## 2017-01-05 DIAGNOSIS — J8410 Pulmonary fibrosis, unspecified: Secondary | ICD-10-CM | POA: Diagnosis not present

## 2017-01-05 DIAGNOSIS — Z9981 Dependence on supplemental oxygen: Secondary | ICD-10-CM | POA: Diagnosis not present

## 2017-01-05 DIAGNOSIS — I5032 Chronic diastolic (congestive) heart failure: Secondary | ICD-10-CM | POA: Diagnosis not present

## 2017-01-05 DIAGNOSIS — Z87440 Personal history of urinary (tract) infections: Secondary | ICD-10-CM | POA: Diagnosis not present

## 2017-01-08 DIAGNOSIS — I110 Hypertensive heart disease with heart failure: Secondary | ICD-10-CM | POA: Diagnosis not present

## 2017-01-08 DIAGNOSIS — J8410 Pulmonary fibrosis, unspecified: Secondary | ICD-10-CM | POA: Diagnosis not present

## 2017-01-08 DIAGNOSIS — Z87440 Personal history of urinary (tract) infections: Secondary | ICD-10-CM | POA: Diagnosis not present

## 2017-01-08 DIAGNOSIS — Z9981 Dependence on supplemental oxygen: Secondary | ICD-10-CM | POA: Diagnosis not present

## 2017-01-08 DIAGNOSIS — I5032 Chronic diastolic (congestive) heart failure: Secondary | ICD-10-CM | POA: Diagnosis not present

## 2017-01-08 DIAGNOSIS — I2510 Atherosclerotic heart disease of native coronary artery without angina pectoris: Secondary | ICD-10-CM | POA: Diagnosis not present

## 2017-01-09 DIAGNOSIS — I2510 Atherosclerotic heart disease of native coronary artery without angina pectoris: Secondary | ICD-10-CM | POA: Diagnosis not present

## 2017-01-09 DIAGNOSIS — Z87440 Personal history of urinary (tract) infections: Secondary | ICD-10-CM | POA: Diagnosis not present

## 2017-01-09 DIAGNOSIS — Z9981 Dependence on supplemental oxygen: Secondary | ICD-10-CM | POA: Diagnosis not present

## 2017-01-09 DIAGNOSIS — J8410 Pulmonary fibrosis, unspecified: Secondary | ICD-10-CM | POA: Diagnosis not present

## 2017-01-09 DIAGNOSIS — I5032 Chronic diastolic (congestive) heart failure: Secondary | ICD-10-CM | POA: Diagnosis not present

## 2017-01-09 DIAGNOSIS — I110 Hypertensive heart disease with heart failure: Secondary | ICD-10-CM | POA: Diagnosis not present

## 2017-01-10 DIAGNOSIS — Z87440 Personal history of urinary (tract) infections: Secondary | ICD-10-CM | POA: Diagnosis not present

## 2017-01-10 DIAGNOSIS — Z9981 Dependence on supplemental oxygen: Secondary | ICD-10-CM | POA: Diagnosis not present

## 2017-01-10 DIAGNOSIS — I5032 Chronic diastolic (congestive) heart failure: Secondary | ICD-10-CM | POA: Diagnosis not present

## 2017-01-10 DIAGNOSIS — J8410 Pulmonary fibrosis, unspecified: Secondary | ICD-10-CM | POA: Diagnosis not present

## 2017-01-10 DIAGNOSIS — I2510 Atherosclerotic heart disease of native coronary artery without angina pectoris: Secondary | ICD-10-CM | POA: Diagnosis not present

## 2017-01-10 DIAGNOSIS — I110 Hypertensive heart disease with heart failure: Secondary | ICD-10-CM | POA: Diagnosis not present

## 2017-01-11 DIAGNOSIS — I110 Hypertensive heart disease with heart failure: Secondary | ICD-10-CM | POA: Diagnosis not present

## 2017-01-11 DIAGNOSIS — I5032 Chronic diastolic (congestive) heart failure: Secondary | ICD-10-CM | POA: Diagnosis not present

## 2017-01-11 DIAGNOSIS — J8410 Pulmonary fibrosis, unspecified: Secondary | ICD-10-CM | POA: Diagnosis not present

## 2017-01-11 DIAGNOSIS — I2510 Atherosclerotic heart disease of native coronary artery without angina pectoris: Secondary | ICD-10-CM | POA: Diagnosis not present

## 2017-01-11 DIAGNOSIS — Z9981 Dependence on supplemental oxygen: Secondary | ICD-10-CM | POA: Diagnosis not present

## 2017-01-11 DIAGNOSIS — Z87440 Personal history of urinary (tract) infections: Secondary | ICD-10-CM | POA: Diagnosis not present

## 2017-01-12 DIAGNOSIS — I2510 Atherosclerotic heart disease of native coronary artery without angina pectoris: Secondary | ICD-10-CM | POA: Diagnosis not present

## 2017-01-12 DIAGNOSIS — Z9981 Dependence on supplemental oxygen: Secondary | ICD-10-CM | POA: Diagnosis not present

## 2017-01-12 DIAGNOSIS — I110 Hypertensive heart disease with heart failure: Secondary | ICD-10-CM | POA: Diagnosis not present

## 2017-01-12 DIAGNOSIS — Z87440 Personal history of urinary (tract) infections: Secondary | ICD-10-CM | POA: Diagnosis not present

## 2017-01-12 DIAGNOSIS — I5032 Chronic diastolic (congestive) heart failure: Secondary | ICD-10-CM | POA: Diagnosis not present

## 2017-01-12 DIAGNOSIS — J8410 Pulmonary fibrosis, unspecified: Secondary | ICD-10-CM | POA: Diagnosis not present

## 2017-01-15 DIAGNOSIS — Z87440 Personal history of urinary (tract) infections: Secondary | ICD-10-CM | POA: Diagnosis not present

## 2017-01-15 DIAGNOSIS — Z9981 Dependence on supplemental oxygen: Secondary | ICD-10-CM | POA: Diagnosis not present

## 2017-01-15 DIAGNOSIS — I110 Hypertensive heart disease with heart failure: Secondary | ICD-10-CM | POA: Diagnosis not present

## 2017-01-15 DIAGNOSIS — J8410 Pulmonary fibrosis, unspecified: Secondary | ICD-10-CM | POA: Diagnosis not present

## 2017-01-15 DIAGNOSIS — I5032 Chronic diastolic (congestive) heart failure: Secondary | ICD-10-CM | POA: Diagnosis not present

## 2017-01-15 DIAGNOSIS — I2510 Atherosclerotic heart disease of native coronary artery without angina pectoris: Secondary | ICD-10-CM | POA: Diagnosis not present

## 2017-01-16 DIAGNOSIS — I2510 Atherosclerotic heart disease of native coronary artery without angina pectoris: Secondary | ICD-10-CM | POA: Diagnosis not present

## 2017-01-16 DIAGNOSIS — Z9981 Dependence on supplemental oxygen: Secondary | ICD-10-CM | POA: Diagnosis not present

## 2017-01-16 DIAGNOSIS — J8410 Pulmonary fibrosis, unspecified: Secondary | ICD-10-CM | POA: Diagnosis not present

## 2017-01-16 DIAGNOSIS — I5032 Chronic diastolic (congestive) heart failure: Secondary | ICD-10-CM | POA: Diagnosis not present

## 2017-01-16 DIAGNOSIS — I110 Hypertensive heart disease with heart failure: Secondary | ICD-10-CM | POA: Diagnosis not present

## 2017-01-16 DIAGNOSIS — Z87440 Personal history of urinary (tract) infections: Secondary | ICD-10-CM | POA: Diagnosis not present

## 2017-01-17 DIAGNOSIS — Z87440 Personal history of urinary (tract) infections: Secondary | ICD-10-CM | POA: Diagnosis not present

## 2017-01-17 DIAGNOSIS — I110 Hypertensive heart disease with heart failure: Secondary | ICD-10-CM | POA: Diagnosis not present

## 2017-01-17 DIAGNOSIS — I2510 Atherosclerotic heart disease of native coronary artery without angina pectoris: Secondary | ICD-10-CM | POA: Diagnosis not present

## 2017-01-17 DIAGNOSIS — J8410 Pulmonary fibrosis, unspecified: Secondary | ICD-10-CM | POA: Diagnosis not present

## 2017-01-17 DIAGNOSIS — I5032 Chronic diastolic (congestive) heart failure: Secondary | ICD-10-CM | POA: Diagnosis not present

## 2017-01-17 DIAGNOSIS — Z9981 Dependence on supplemental oxygen: Secondary | ICD-10-CM | POA: Diagnosis not present

## 2017-01-18 DIAGNOSIS — I2510 Atherosclerotic heart disease of native coronary artery without angina pectoris: Secondary | ICD-10-CM | POA: Diagnosis not present

## 2017-01-18 DIAGNOSIS — I5032 Chronic diastolic (congestive) heart failure: Secondary | ICD-10-CM | POA: Diagnosis not present

## 2017-01-18 DIAGNOSIS — Z87440 Personal history of urinary (tract) infections: Secondary | ICD-10-CM | POA: Diagnosis not present

## 2017-01-18 DIAGNOSIS — I110 Hypertensive heart disease with heart failure: Secondary | ICD-10-CM | POA: Diagnosis not present

## 2017-01-18 DIAGNOSIS — Z9981 Dependence on supplemental oxygen: Secondary | ICD-10-CM | POA: Diagnosis not present

## 2017-01-18 DIAGNOSIS — J8410 Pulmonary fibrosis, unspecified: Secondary | ICD-10-CM | POA: Diagnosis not present

## 2017-01-19 DIAGNOSIS — I110 Hypertensive heart disease with heart failure: Secondary | ICD-10-CM | POA: Diagnosis not present

## 2017-01-19 DIAGNOSIS — I5032 Chronic diastolic (congestive) heart failure: Secondary | ICD-10-CM | POA: Diagnosis not present

## 2017-01-19 DIAGNOSIS — I509 Heart failure, unspecified: Secondary | ICD-10-CM | POA: Diagnosis not present

## 2017-01-19 DIAGNOSIS — I2510 Atherosclerotic heart disease of native coronary artery without angina pectoris: Secondary | ICD-10-CM | POA: Diagnosis not present

## 2017-01-19 DIAGNOSIS — Z9981 Dependence on supplemental oxygen: Secondary | ICD-10-CM | POA: Diagnosis not present

## 2017-01-19 DIAGNOSIS — J8410 Pulmonary fibrosis, unspecified: Secondary | ICD-10-CM | POA: Diagnosis not present

## 2017-01-19 DIAGNOSIS — Z87440 Personal history of urinary (tract) infections: Secondary | ICD-10-CM | POA: Diagnosis not present

## 2017-01-19 DIAGNOSIS — J9610 Chronic respiratory failure, unspecified whether with hypoxia or hypercapnia: Secondary | ICD-10-CM | POA: Diagnosis not present

## 2017-01-19 DIAGNOSIS — I10 Essential (primary) hypertension: Secondary | ICD-10-CM | POA: Diagnosis not present

## 2017-01-22 DIAGNOSIS — I2510 Atherosclerotic heart disease of native coronary artery without angina pectoris: Secondary | ICD-10-CM | POA: Diagnosis not present

## 2017-01-22 DIAGNOSIS — J8410 Pulmonary fibrosis, unspecified: Secondary | ICD-10-CM | POA: Diagnosis not present

## 2017-01-22 DIAGNOSIS — I5032 Chronic diastolic (congestive) heart failure: Secondary | ICD-10-CM | POA: Diagnosis not present

## 2017-01-22 DIAGNOSIS — Z87440 Personal history of urinary (tract) infections: Secondary | ICD-10-CM | POA: Diagnosis not present

## 2017-01-22 DIAGNOSIS — I110 Hypertensive heart disease with heart failure: Secondary | ICD-10-CM | POA: Diagnosis not present

## 2017-01-22 DIAGNOSIS — Z9981 Dependence on supplemental oxygen: Secondary | ICD-10-CM | POA: Diagnosis not present

## 2017-01-23 DIAGNOSIS — I110 Hypertensive heart disease with heart failure: Secondary | ICD-10-CM | POA: Diagnosis not present

## 2017-01-23 DIAGNOSIS — Z87440 Personal history of urinary (tract) infections: Secondary | ICD-10-CM | POA: Diagnosis not present

## 2017-01-23 DIAGNOSIS — I2510 Atherosclerotic heart disease of native coronary artery without angina pectoris: Secondary | ICD-10-CM | POA: Diagnosis not present

## 2017-01-23 DIAGNOSIS — Z9981 Dependence on supplemental oxygen: Secondary | ICD-10-CM | POA: Diagnosis not present

## 2017-01-23 DIAGNOSIS — I5032 Chronic diastolic (congestive) heart failure: Secondary | ICD-10-CM | POA: Diagnosis not present

## 2017-01-23 DIAGNOSIS — J8410 Pulmonary fibrosis, unspecified: Secondary | ICD-10-CM | POA: Diagnosis not present

## 2017-01-24 DIAGNOSIS — I5032 Chronic diastolic (congestive) heart failure: Secondary | ICD-10-CM | POA: Diagnosis not present

## 2017-01-24 DIAGNOSIS — I110 Hypertensive heart disease with heart failure: Secondary | ICD-10-CM | POA: Diagnosis not present

## 2017-01-24 DIAGNOSIS — Z9981 Dependence on supplemental oxygen: Secondary | ICD-10-CM | POA: Diagnosis not present

## 2017-01-24 DIAGNOSIS — J8410 Pulmonary fibrosis, unspecified: Secondary | ICD-10-CM | POA: Diagnosis not present

## 2017-01-24 DIAGNOSIS — I2510 Atherosclerotic heart disease of native coronary artery without angina pectoris: Secondary | ICD-10-CM | POA: Diagnosis not present

## 2017-01-24 DIAGNOSIS — Z87440 Personal history of urinary (tract) infections: Secondary | ICD-10-CM | POA: Diagnosis not present

## 2017-01-25 DIAGNOSIS — Z9981 Dependence on supplemental oxygen: Secondary | ICD-10-CM | POA: Diagnosis not present

## 2017-01-25 DIAGNOSIS — J8410 Pulmonary fibrosis, unspecified: Secondary | ICD-10-CM | POA: Diagnosis not present

## 2017-01-25 DIAGNOSIS — I5032 Chronic diastolic (congestive) heart failure: Secondary | ICD-10-CM | POA: Diagnosis not present

## 2017-01-25 DIAGNOSIS — Z87440 Personal history of urinary (tract) infections: Secondary | ICD-10-CM | POA: Diagnosis not present

## 2017-01-25 DIAGNOSIS — I2510 Atherosclerotic heart disease of native coronary artery without angina pectoris: Secondary | ICD-10-CM | POA: Diagnosis not present

## 2017-01-25 DIAGNOSIS — I110 Hypertensive heart disease with heart failure: Secondary | ICD-10-CM | POA: Diagnosis not present

## 2017-01-26 DIAGNOSIS — I5032 Chronic diastolic (congestive) heart failure: Secondary | ICD-10-CM | POA: Diagnosis not present

## 2017-01-26 DIAGNOSIS — Z87440 Personal history of urinary (tract) infections: Secondary | ICD-10-CM | POA: Diagnosis not present

## 2017-01-26 DIAGNOSIS — I110 Hypertensive heart disease with heart failure: Secondary | ICD-10-CM | POA: Diagnosis not present

## 2017-01-26 DIAGNOSIS — I2510 Atherosclerotic heart disease of native coronary artery without angina pectoris: Secondary | ICD-10-CM | POA: Diagnosis not present

## 2017-01-26 DIAGNOSIS — Z9981 Dependence on supplemental oxygen: Secondary | ICD-10-CM | POA: Diagnosis not present

## 2017-01-26 DIAGNOSIS — J8410 Pulmonary fibrosis, unspecified: Secondary | ICD-10-CM | POA: Diagnosis not present

## 2017-01-29 DIAGNOSIS — J8410 Pulmonary fibrosis, unspecified: Secondary | ICD-10-CM | POA: Diagnosis not present

## 2017-01-29 DIAGNOSIS — Z87440 Personal history of urinary (tract) infections: Secondary | ICD-10-CM | POA: Diagnosis not present

## 2017-01-29 DIAGNOSIS — Z9981 Dependence on supplemental oxygen: Secondary | ICD-10-CM | POA: Diagnosis not present

## 2017-01-29 DIAGNOSIS — I5032 Chronic diastolic (congestive) heart failure: Secondary | ICD-10-CM | POA: Diagnosis not present

## 2017-01-29 DIAGNOSIS — I110 Hypertensive heart disease with heart failure: Secondary | ICD-10-CM | POA: Diagnosis not present

## 2017-01-29 DIAGNOSIS — I2510 Atherosclerotic heart disease of native coronary artery without angina pectoris: Secondary | ICD-10-CM | POA: Diagnosis not present

## 2017-01-30 DIAGNOSIS — Z9981 Dependence on supplemental oxygen: Secondary | ICD-10-CM | POA: Diagnosis not present

## 2017-01-30 DIAGNOSIS — Z87440 Personal history of urinary (tract) infections: Secondary | ICD-10-CM | POA: Diagnosis not present

## 2017-01-30 DIAGNOSIS — I5032 Chronic diastolic (congestive) heart failure: Secondary | ICD-10-CM | POA: Diagnosis not present

## 2017-01-30 DIAGNOSIS — J8410 Pulmonary fibrosis, unspecified: Secondary | ICD-10-CM | POA: Diagnosis not present

## 2017-01-30 DIAGNOSIS — I2510 Atherosclerotic heart disease of native coronary artery without angina pectoris: Secondary | ICD-10-CM | POA: Diagnosis not present

## 2017-01-30 DIAGNOSIS — I110 Hypertensive heart disease with heart failure: Secondary | ICD-10-CM | POA: Diagnosis not present

## 2017-01-31 DIAGNOSIS — J8410 Pulmonary fibrosis, unspecified: Secondary | ICD-10-CM | POA: Diagnosis not present

## 2017-01-31 DIAGNOSIS — Z9981 Dependence on supplemental oxygen: Secondary | ICD-10-CM | POA: Diagnosis not present

## 2017-01-31 DIAGNOSIS — I2510 Atherosclerotic heart disease of native coronary artery without angina pectoris: Secondary | ICD-10-CM | POA: Diagnosis not present

## 2017-01-31 DIAGNOSIS — Z87440 Personal history of urinary (tract) infections: Secondary | ICD-10-CM | POA: Diagnosis not present

## 2017-01-31 DIAGNOSIS — I5032 Chronic diastolic (congestive) heart failure: Secondary | ICD-10-CM | POA: Diagnosis not present

## 2017-01-31 DIAGNOSIS — I110 Hypertensive heart disease with heart failure: Secondary | ICD-10-CM | POA: Diagnosis not present

## 2017-02-01 DIAGNOSIS — I2510 Atherosclerotic heart disease of native coronary artery without angina pectoris: Secondary | ICD-10-CM | POA: Diagnosis not present

## 2017-02-01 DIAGNOSIS — Z9981 Dependence on supplemental oxygen: Secondary | ICD-10-CM | POA: Diagnosis not present

## 2017-02-01 DIAGNOSIS — Z87440 Personal history of urinary (tract) infections: Secondary | ICD-10-CM | POA: Diagnosis not present

## 2017-02-01 DIAGNOSIS — I5032 Chronic diastolic (congestive) heart failure: Secondary | ICD-10-CM | POA: Diagnosis not present

## 2017-02-01 DIAGNOSIS — I110 Hypertensive heart disease with heart failure: Secondary | ICD-10-CM | POA: Diagnosis not present

## 2017-02-01 DIAGNOSIS — J8410 Pulmonary fibrosis, unspecified: Secondary | ICD-10-CM | POA: Diagnosis not present

## 2017-02-02 DIAGNOSIS — I110 Hypertensive heart disease with heart failure: Secondary | ICD-10-CM | POA: Diagnosis not present

## 2017-02-02 DIAGNOSIS — I2510 Atherosclerotic heart disease of native coronary artery without angina pectoris: Secondary | ICD-10-CM | POA: Diagnosis not present

## 2017-02-02 DIAGNOSIS — Z9981 Dependence on supplemental oxygen: Secondary | ICD-10-CM | POA: Diagnosis not present

## 2017-02-02 DIAGNOSIS — Z87440 Personal history of urinary (tract) infections: Secondary | ICD-10-CM | POA: Diagnosis not present

## 2017-02-02 DIAGNOSIS — J8410 Pulmonary fibrosis, unspecified: Secondary | ICD-10-CM | POA: Diagnosis not present

## 2017-02-02 DIAGNOSIS — I5032 Chronic diastolic (congestive) heart failure: Secondary | ICD-10-CM | POA: Diagnosis not present

## 2017-02-04 DIAGNOSIS — J8410 Pulmonary fibrosis, unspecified: Secondary | ICD-10-CM | POA: Diagnosis not present

## 2017-02-04 DIAGNOSIS — Z9981 Dependence on supplemental oxygen: Secondary | ICD-10-CM | POA: Diagnosis not present

## 2017-02-04 DIAGNOSIS — I110 Hypertensive heart disease with heart failure: Secondary | ICD-10-CM | POA: Diagnosis not present

## 2017-02-04 DIAGNOSIS — K219 Gastro-esophageal reflux disease without esophagitis: Secondary | ICD-10-CM | POA: Diagnosis not present

## 2017-02-04 DIAGNOSIS — I2510 Atherosclerotic heart disease of native coronary artery without angina pectoris: Secondary | ICD-10-CM | POA: Diagnosis not present

## 2017-02-04 DIAGNOSIS — Z853 Personal history of malignant neoplasm of breast: Secondary | ICD-10-CM | POA: Diagnosis not present

## 2017-02-04 DIAGNOSIS — I5032 Chronic diastolic (congestive) heart failure: Secondary | ICD-10-CM | POA: Diagnosis not present

## 2017-02-04 DIAGNOSIS — Z87440 Personal history of urinary (tract) infections: Secondary | ICD-10-CM | POA: Diagnosis not present

## 2017-02-05 DIAGNOSIS — Z87440 Personal history of urinary (tract) infections: Secondary | ICD-10-CM | POA: Diagnosis not present

## 2017-02-05 DIAGNOSIS — I5032 Chronic diastolic (congestive) heart failure: Secondary | ICD-10-CM | POA: Diagnosis not present

## 2017-02-05 DIAGNOSIS — I2510 Atherosclerotic heart disease of native coronary artery without angina pectoris: Secondary | ICD-10-CM | POA: Diagnosis not present

## 2017-02-05 DIAGNOSIS — I110 Hypertensive heart disease with heart failure: Secondary | ICD-10-CM | POA: Diagnosis not present

## 2017-02-05 DIAGNOSIS — Z9981 Dependence on supplemental oxygen: Secondary | ICD-10-CM | POA: Diagnosis not present

## 2017-02-05 DIAGNOSIS — J8410 Pulmonary fibrosis, unspecified: Secondary | ICD-10-CM | POA: Diagnosis not present

## 2017-02-06 DIAGNOSIS — I2510 Atherosclerotic heart disease of native coronary artery without angina pectoris: Secondary | ICD-10-CM | POA: Diagnosis not present

## 2017-02-06 DIAGNOSIS — Z87440 Personal history of urinary (tract) infections: Secondary | ICD-10-CM | POA: Diagnosis not present

## 2017-02-06 DIAGNOSIS — Z9981 Dependence on supplemental oxygen: Secondary | ICD-10-CM | POA: Diagnosis not present

## 2017-02-06 DIAGNOSIS — I5032 Chronic diastolic (congestive) heart failure: Secondary | ICD-10-CM | POA: Diagnosis not present

## 2017-02-06 DIAGNOSIS — J8410 Pulmonary fibrosis, unspecified: Secondary | ICD-10-CM | POA: Diagnosis not present

## 2017-02-06 DIAGNOSIS — I110 Hypertensive heart disease with heart failure: Secondary | ICD-10-CM | POA: Diagnosis not present

## 2017-02-07 DIAGNOSIS — I5033 Acute on chronic diastolic (congestive) heart failure: Secondary | ICD-10-CM | POA: Diagnosis not present

## 2017-02-07 DIAGNOSIS — I5032 Chronic diastolic (congestive) heart failure: Secondary | ICD-10-CM | POA: Diagnosis not present

## 2017-02-07 DIAGNOSIS — Z87440 Personal history of urinary (tract) infections: Secondary | ICD-10-CM | POA: Diagnosis not present

## 2017-02-07 DIAGNOSIS — K219 Gastro-esophageal reflux disease without esophagitis: Secondary | ICD-10-CM | POA: Diagnosis not present

## 2017-02-07 DIAGNOSIS — J8410 Pulmonary fibrosis, unspecified: Secondary | ICD-10-CM | POA: Diagnosis not present

## 2017-02-07 DIAGNOSIS — R4182 Altered mental status, unspecified: Secondary | ICD-10-CM | POA: Diagnosis not present

## 2017-02-07 DIAGNOSIS — Z9981 Dependence on supplemental oxygen: Secondary | ICD-10-CM | POA: Diagnosis not present

## 2017-02-07 DIAGNOSIS — I110 Hypertensive heart disease with heart failure: Secondary | ICD-10-CM | POA: Diagnosis not present

## 2017-02-07 DIAGNOSIS — I2510 Atherosclerotic heart disease of native coronary artery without angina pectoris: Secondary | ICD-10-CM | POA: Diagnosis not present

## 2017-02-07 DIAGNOSIS — J9610 Chronic respiratory failure, unspecified whether with hypoxia or hypercapnia: Secondary | ICD-10-CM | POA: Diagnosis not present

## 2017-02-08 DIAGNOSIS — Z9981 Dependence on supplemental oxygen: Secondary | ICD-10-CM | POA: Diagnosis not present

## 2017-02-08 DIAGNOSIS — I2510 Atherosclerotic heart disease of native coronary artery without angina pectoris: Secondary | ICD-10-CM | POA: Diagnosis not present

## 2017-02-08 DIAGNOSIS — J8410 Pulmonary fibrosis, unspecified: Secondary | ICD-10-CM | POA: Diagnosis not present

## 2017-02-08 DIAGNOSIS — Z87440 Personal history of urinary (tract) infections: Secondary | ICD-10-CM | POA: Diagnosis not present

## 2017-02-08 DIAGNOSIS — I110 Hypertensive heart disease with heart failure: Secondary | ICD-10-CM | POA: Diagnosis not present

## 2017-02-08 DIAGNOSIS — I5032 Chronic diastolic (congestive) heart failure: Secondary | ICD-10-CM | POA: Diagnosis not present

## 2017-02-09 DIAGNOSIS — Z9981 Dependence on supplemental oxygen: Secondary | ICD-10-CM | POA: Diagnosis not present

## 2017-02-09 DIAGNOSIS — J8410 Pulmonary fibrosis, unspecified: Secondary | ICD-10-CM | POA: Diagnosis not present

## 2017-02-09 DIAGNOSIS — Z87440 Personal history of urinary (tract) infections: Secondary | ICD-10-CM | POA: Diagnosis not present

## 2017-02-09 DIAGNOSIS — I2510 Atherosclerotic heart disease of native coronary artery without angina pectoris: Secondary | ICD-10-CM | POA: Diagnosis not present

## 2017-02-09 DIAGNOSIS — I110 Hypertensive heart disease with heart failure: Secondary | ICD-10-CM | POA: Diagnosis not present

## 2017-02-09 DIAGNOSIS — I5032 Chronic diastolic (congestive) heart failure: Secondary | ICD-10-CM | POA: Diagnosis not present

## 2017-02-11 DIAGNOSIS — I5032 Chronic diastolic (congestive) heart failure: Secondary | ICD-10-CM | POA: Diagnosis not present

## 2017-02-11 DIAGNOSIS — J8410 Pulmonary fibrosis, unspecified: Secondary | ICD-10-CM | POA: Diagnosis not present

## 2017-02-11 DIAGNOSIS — I110 Hypertensive heart disease with heart failure: Secondary | ICD-10-CM | POA: Diagnosis not present

## 2017-02-11 DIAGNOSIS — Z9981 Dependence on supplemental oxygen: Secondary | ICD-10-CM | POA: Diagnosis not present

## 2017-02-11 DIAGNOSIS — Z87440 Personal history of urinary (tract) infections: Secondary | ICD-10-CM | POA: Diagnosis not present

## 2017-02-11 DIAGNOSIS — I2510 Atherosclerotic heart disease of native coronary artery without angina pectoris: Secondary | ICD-10-CM | POA: Diagnosis not present

## 2017-02-12 DIAGNOSIS — Z87440 Personal history of urinary (tract) infections: Secondary | ICD-10-CM | POA: Diagnosis not present

## 2017-02-12 DIAGNOSIS — J8410 Pulmonary fibrosis, unspecified: Secondary | ICD-10-CM | POA: Diagnosis not present

## 2017-02-12 DIAGNOSIS — I110 Hypertensive heart disease with heart failure: Secondary | ICD-10-CM | POA: Diagnosis not present

## 2017-02-12 DIAGNOSIS — Z9981 Dependence on supplemental oxygen: Secondary | ICD-10-CM | POA: Diagnosis not present

## 2017-02-12 DIAGNOSIS — I2510 Atherosclerotic heart disease of native coronary artery without angina pectoris: Secondary | ICD-10-CM | POA: Diagnosis not present

## 2017-02-12 DIAGNOSIS — I10 Essential (primary) hypertension: Secondary | ICD-10-CM | POA: Diagnosis not present

## 2017-02-12 DIAGNOSIS — I5032 Chronic diastolic (congestive) heart failure: Secondary | ICD-10-CM | POA: Diagnosis not present

## 2017-02-13 DIAGNOSIS — I5032 Chronic diastolic (congestive) heart failure: Secondary | ICD-10-CM | POA: Diagnosis not present

## 2017-02-13 DIAGNOSIS — I110 Hypertensive heart disease with heart failure: Secondary | ICD-10-CM | POA: Diagnosis not present

## 2017-02-13 DIAGNOSIS — J8410 Pulmonary fibrosis, unspecified: Secondary | ICD-10-CM | POA: Diagnosis not present

## 2017-02-13 DIAGNOSIS — Z9981 Dependence on supplemental oxygen: Secondary | ICD-10-CM | POA: Diagnosis not present

## 2017-02-13 DIAGNOSIS — I2510 Atherosclerotic heart disease of native coronary artery without angina pectoris: Secondary | ICD-10-CM | POA: Diagnosis not present

## 2017-02-13 DIAGNOSIS — Z87440 Personal history of urinary (tract) infections: Secondary | ICD-10-CM | POA: Diagnosis not present

## 2017-02-14 DIAGNOSIS — Z87440 Personal history of urinary (tract) infections: Secondary | ICD-10-CM | POA: Diagnosis not present

## 2017-02-14 DIAGNOSIS — I5032 Chronic diastolic (congestive) heart failure: Secondary | ICD-10-CM | POA: Diagnosis not present

## 2017-02-14 DIAGNOSIS — J8410 Pulmonary fibrosis, unspecified: Secondary | ICD-10-CM | POA: Diagnosis not present

## 2017-02-14 DIAGNOSIS — I110 Hypertensive heart disease with heart failure: Secondary | ICD-10-CM | POA: Diagnosis not present

## 2017-02-14 DIAGNOSIS — I2510 Atherosclerotic heart disease of native coronary artery without angina pectoris: Secondary | ICD-10-CM | POA: Diagnosis not present

## 2017-02-14 DIAGNOSIS — Z9981 Dependence on supplemental oxygen: Secondary | ICD-10-CM | POA: Diagnosis not present

## 2017-02-15 DIAGNOSIS — I110 Hypertensive heart disease with heart failure: Secondary | ICD-10-CM | POA: Diagnosis not present

## 2017-02-15 DIAGNOSIS — Z87440 Personal history of urinary (tract) infections: Secondary | ICD-10-CM | POA: Diagnosis not present

## 2017-02-15 DIAGNOSIS — I5032 Chronic diastolic (congestive) heart failure: Secondary | ICD-10-CM | POA: Diagnosis not present

## 2017-02-15 DIAGNOSIS — J8410 Pulmonary fibrosis, unspecified: Secondary | ICD-10-CM | POA: Diagnosis not present

## 2017-02-15 DIAGNOSIS — Z9981 Dependence on supplemental oxygen: Secondary | ICD-10-CM | POA: Diagnosis not present

## 2017-02-15 DIAGNOSIS — I2510 Atherosclerotic heart disease of native coronary artery without angina pectoris: Secondary | ICD-10-CM | POA: Diagnosis not present

## 2017-02-16 DIAGNOSIS — Z87440 Personal history of urinary (tract) infections: Secondary | ICD-10-CM | POA: Diagnosis not present

## 2017-02-16 DIAGNOSIS — I5032 Chronic diastolic (congestive) heart failure: Secondary | ICD-10-CM | POA: Diagnosis not present

## 2017-02-16 DIAGNOSIS — I2510 Atherosclerotic heart disease of native coronary artery without angina pectoris: Secondary | ICD-10-CM | POA: Diagnosis not present

## 2017-02-16 DIAGNOSIS — I110 Hypertensive heart disease with heart failure: Secondary | ICD-10-CM | POA: Diagnosis not present

## 2017-02-16 DIAGNOSIS — J8410 Pulmonary fibrosis, unspecified: Secondary | ICD-10-CM | POA: Diagnosis not present

## 2017-02-16 DIAGNOSIS — Z9981 Dependence on supplemental oxygen: Secondary | ICD-10-CM | POA: Diagnosis not present

## 2017-02-19 DIAGNOSIS — Z87440 Personal history of urinary (tract) infections: Secondary | ICD-10-CM | POA: Diagnosis not present

## 2017-02-19 DIAGNOSIS — I5032 Chronic diastolic (congestive) heart failure: Secondary | ICD-10-CM | POA: Diagnosis not present

## 2017-02-19 DIAGNOSIS — I2510 Atherosclerotic heart disease of native coronary artery without angina pectoris: Secondary | ICD-10-CM | POA: Diagnosis not present

## 2017-02-19 DIAGNOSIS — J8410 Pulmonary fibrosis, unspecified: Secondary | ICD-10-CM | POA: Diagnosis not present

## 2017-02-19 DIAGNOSIS — I110 Hypertensive heart disease with heart failure: Secondary | ICD-10-CM | POA: Diagnosis not present

## 2017-02-19 DIAGNOSIS — Z9981 Dependence on supplemental oxygen: Secondary | ICD-10-CM | POA: Diagnosis not present

## 2017-02-20 DIAGNOSIS — J8410 Pulmonary fibrosis, unspecified: Secondary | ICD-10-CM | POA: Diagnosis not present

## 2017-02-20 DIAGNOSIS — I110 Hypertensive heart disease with heart failure: Secondary | ICD-10-CM | POA: Diagnosis not present

## 2017-02-20 DIAGNOSIS — I5032 Chronic diastolic (congestive) heart failure: Secondary | ICD-10-CM | POA: Diagnosis not present

## 2017-02-20 DIAGNOSIS — I2510 Atherosclerotic heart disease of native coronary artery without angina pectoris: Secondary | ICD-10-CM | POA: Diagnosis not present

## 2017-02-20 DIAGNOSIS — Z9981 Dependence on supplemental oxygen: Secondary | ICD-10-CM | POA: Diagnosis not present

## 2017-02-20 DIAGNOSIS — Z87440 Personal history of urinary (tract) infections: Secondary | ICD-10-CM | POA: Diagnosis not present

## 2017-02-21 DIAGNOSIS — Z87440 Personal history of urinary (tract) infections: Secondary | ICD-10-CM | POA: Diagnosis not present

## 2017-02-21 DIAGNOSIS — I110 Hypertensive heart disease with heart failure: Secondary | ICD-10-CM | POA: Diagnosis not present

## 2017-02-21 DIAGNOSIS — Z9981 Dependence on supplemental oxygen: Secondary | ICD-10-CM | POA: Diagnosis not present

## 2017-02-21 DIAGNOSIS — J8410 Pulmonary fibrosis, unspecified: Secondary | ICD-10-CM | POA: Diagnosis not present

## 2017-02-21 DIAGNOSIS — I5032 Chronic diastolic (congestive) heart failure: Secondary | ICD-10-CM | POA: Diagnosis not present

## 2017-02-21 DIAGNOSIS — I2510 Atherosclerotic heart disease of native coronary artery without angina pectoris: Secondary | ICD-10-CM | POA: Diagnosis not present

## 2017-02-22 DIAGNOSIS — J8410 Pulmonary fibrosis, unspecified: Secondary | ICD-10-CM | POA: Diagnosis not present

## 2017-02-22 DIAGNOSIS — Z9981 Dependence on supplemental oxygen: Secondary | ICD-10-CM | POA: Diagnosis not present

## 2017-02-22 DIAGNOSIS — I2510 Atherosclerotic heart disease of native coronary artery without angina pectoris: Secondary | ICD-10-CM | POA: Diagnosis not present

## 2017-02-22 DIAGNOSIS — Z87440 Personal history of urinary (tract) infections: Secondary | ICD-10-CM | POA: Diagnosis not present

## 2017-02-22 DIAGNOSIS — I5032 Chronic diastolic (congestive) heart failure: Secondary | ICD-10-CM | POA: Diagnosis not present

## 2017-02-22 DIAGNOSIS — I110 Hypertensive heart disease with heart failure: Secondary | ICD-10-CM | POA: Diagnosis not present

## 2017-02-23 DIAGNOSIS — I2510 Atherosclerotic heart disease of native coronary artery without angina pectoris: Secondary | ICD-10-CM | POA: Diagnosis not present

## 2017-02-23 DIAGNOSIS — I110 Hypertensive heart disease with heart failure: Secondary | ICD-10-CM | POA: Diagnosis not present

## 2017-02-23 DIAGNOSIS — I5032 Chronic diastolic (congestive) heart failure: Secondary | ICD-10-CM | POA: Diagnosis not present

## 2017-02-23 DIAGNOSIS — Z87440 Personal history of urinary (tract) infections: Secondary | ICD-10-CM | POA: Diagnosis not present

## 2017-02-23 DIAGNOSIS — J8410 Pulmonary fibrosis, unspecified: Secondary | ICD-10-CM | POA: Diagnosis not present

## 2017-02-23 DIAGNOSIS — Z9981 Dependence on supplemental oxygen: Secondary | ICD-10-CM | POA: Diagnosis not present

## 2017-02-26 DIAGNOSIS — Z9981 Dependence on supplemental oxygen: Secondary | ICD-10-CM | POA: Diagnosis not present

## 2017-02-26 DIAGNOSIS — J8410 Pulmonary fibrosis, unspecified: Secondary | ICD-10-CM | POA: Diagnosis not present

## 2017-02-26 DIAGNOSIS — I110 Hypertensive heart disease with heart failure: Secondary | ICD-10-CM | POA: Diagnosis not present

## 2017-02-26 DIAGNOSIS — I2510 Atherosclerotic heart disease of native coronary artery without angina pectoris: Secondary | ICD-10-CM | POA: Diagnosis not present

## 2017-02-26 DIAGNOSIS — I5032 Chronic diastolic (congestive) heart failure: Secondary | ICD-10-CM | POA: Diagnosis not present

## 2017-02-26 DIAGNOSIS — Z87440 Personal history of urinary (tract) infections: Secondary | ICD-10-CM | POA: Diagnosis not present

## 2017-02-27 DIAGNOSIS — I110 Hypertensive heart disease with heart failure: Secondary | ICD-10-CM | POA: Diagnosis not present

## 2017-02-27 DIAGNOSIS — I2510 Atherosclerotic heart disease of native coronary artery without angina pectoris: Secondary | ICD-10-CM | POA: Diagnosis not present

## 2017-02-27 DIAGNOSIS — Z9981 Dependence on supplemental oxygen: Secondary | ICD-10-CM | POA: Diagnosis not present

## 2017-02-27 DIAGNOSIS — I5032 Chronic diastolic (congestive) heart failure: Secondary | ICD-10-CM | POA: Diagnosis not present

## 2017-02-27 DIAGNOSIS — J8410 Pulmonary fibrosis, unspecified: Secondary | ICD-10-CM | POA: Diagnosis not present

## 2017-02-27 DIAGNOSIS — Z87440 Personal history of urinary (tract) infections: Secondary | ICD-10-CM | POA: Diagnosis not present

## 2017-02-28 DIAGNOSIS — J8410 Pulmonary fibrosis, unspecified: Secondary | ICD-10-CM | POA: Diagnosis not present

## 2017-02-28 DIAGNOSIS — Z9981 Dependence on supplemental oxygen: Secondary | ICD-10-CM | POA: Diagnosis not present

## 2017-02-28 DIAGNOSIS — I5032 Chronic diastolic (congestive) heart failure: Secondary | ICD-10-CM | POA: Diagnosis not present

## 2017-02-28 DIAGNOSIS — Z87440 Personal history of urinary (tract) infections: Secondary | ICD-10-CM | POA: Diagnosis not present

## 2017-02-28 DIAGNOSIS — I110 Hypertensive heart disease with heart failure: Secondary | ICD-10-CM | POA: Diagnosis not present

## 2017-02-28 DIAGNOSIS — I2510 Atherosclerotic heart disease of native coronary artery without angina pectoris: Secondary | ICD-10-CM | POA: Diagnosis not present

## 2017-03-01 ENCOUNTER — Telehealth: Payer: Self-pay | Admitting: Internal Medicine

## 2017-03-06 NOTE — Telephone Encounter (Signed)
noted 

## 2017-03-06 NOTE — Telephone Encounter (Signed)
Pt passed this morning at 6:40.

## 2017-03-06 DEATH — deceased

## 2017-07-04 ENCOUNTER — Telehealth: Payer: Self-pay | Admitting: Internal Medicine

## 2017-07-04 NOTE — Telephone Encounter (Signed)
It doesn't matter - I do not mind signing.  I think pulmonary was doing this not [place]

## 2017-07-04 NOTE — Telephone Encounter (Signed)
Paper work has been given to MD, will faxed once she returns to office on 07/10/17

## 2017-07-04 NOTE — Telephone Encounter (Signed)
Following up on recert for oxygen for 11/46/43.  States has faxed several times over.  Will refax again today to [facility].

## 2017-07-04 NOTE — Telephone Encounter (Signed)
Faxed was received. Does this need to come from pulmonary?

## 2017-07-10 NOTE — Telephone Encounter (Signed)
Paper work has been faxed
